# Patient Record
Sex: Female | Born: 1950
Health system: Southern US, Community
[De-identification: ages and names within clinical notes are randomized; demographics above are authoritative.]

## PROBLEM LIST (undated history)

## (undated) DIAGNOSIS — M1990 Unspecified osteoarthritis, unspecified site: Secondary | ICD-10-CM

## (undated) DIAGNOSIS — N1330 Unspecified hydronephrosis: Secondary | ICD-10-CM

## (undated) DIAGNOSIS — Z22322 Carrier or suspected carrier of Methicillin resistant Staphylococcus aureus: Secondary | ICD-10-CM

## (undated) DIAGNOSIS — C801 Malignant (primary) neoplasm, unspecified: Secondary | ICD-10-CM

## (undated) DIAGNOSIS — N201 Calculus of ureter: Secondary | ICD-10-CM

## (undated) DIAGNOSIS — F419 Anxiety disorder, unspecified: Secondary | ICD-10-CM

## (undated) DIAGNOSIS — R51 Headache: Secondary | ICD-10-CM

## (undated) DIAGNOSIS — I10 Essential (primary) hypertension: Secondary | ICD-10-CM

## (undated) DIAGNOSIS — J189 Pneumonia, unspecified organism: Secondary | ICD-10-CM

## (undated) DIAGNOSIS — E785 Hyperlipidemia, unspecified: Secondary | ICD-10-CM

## (undated) DIAGNOSIS — N200 Calculus of kidney: Secondary | ICD-10-CM

## (undated) DIAGNOSIS — Z87442 Personal history of urinary calculi: Secondary | ICD-10-CM

## (undated) DIAGNOSIS — I82409 Acute embolism and thrombosis of unspecified deep veins of unspecified lower extremity: Secondary | ICD-10-CM

## (undated) HISTORY — PX: BLADDER REPAIR: SHX76

## (undated) HISTORY — DX: Hyperlipidemia, unspecified: E78.5

## (undated) HISTORY — DX: Essential (primary) hypertension: I10

## (undated) HISTORY — DX: Calculus of kidney: N20.0

## (undated) HISTORY — DX: Headache: R51

## (undated) HISTORY — PX: VEIN LIGATION AND STRIPPING: SHX2653

---

## 1972-01-10 HISTORY — PX: PILONIDAL CYST EXCISION: SHX744

## 1997-05-19 ENCOUNTER — Other Ambulatory Visit: Admission: RE | Admit: 1997-05-19 | Discharge: 1997-05-19 | Payer: Self-pay | Admitting: Obstetrics & Gynecology

## 1998-01-09 HISTORY — PX: COLONOSCOPY: SHX174

## 1998-07-23 ENCOUNTER — Other Ambulatory Visit: Admission: RE | Admit: 1998-07-23 | Discharge: 1998-07-23 | Payer: Self-pay | Admitting: Obstetrics & Gynecology

## 1998-12-14 ENCOUNTER — Encounter (INDEPENDENT_AMBULATORY_CARE_PROVIDER_SITE_OTHER): Payer: Self-pay | Admitting: Specialist

## 1998-12-14 ENCOUNTER — Other Ambulatory Visit: Admission: RE | Admit: 1998-12-14 | Discharge: 1998-12-14 | Payer: Self-pay | Admitting: Obstetrics & Gynecology

## 1999-08-22 ENCOUNTER — Other Ambulatory Visit: Admission: RE | Admit: 1999-08-22 | Discharge: 1999-08-22 | Payer: Self-pay | Admitting: Obstetrics & Gynecology

## 2000-03-13 ENCOUNTER — Ambulatory Visit (HOSPITAL_COMMUNITY): Admission: RE | Admit: 2000-03-13 | Discharge: 2000-03-13 | Payer: Self-pay | Admitting: Urology

## 2000-03-13 HISTORY — PX: OTHER SURGICAL HISTORY: SHX169

## 2000-07-11 ENCOUNTER — Ambulatory Visit (HOSPITAL_COMMUNITY): Admission: RE | Admit: 2000-07-11 | Discharge: 2000-07-11 | Payer: Self-pay | Admitting: *Deleted

## 2001-03-29 ENCOUNTER — Other Ambulatory Visit: Admission: RE | Admit: 2001-03-29 | Discharge: 2001-03-29 | Payer: Self-pay | Admitting: Obstetrics & Gynecology

## 2002-02-05 ENCOUNTER — Other Ambulatory Visit: Admission: RE | Admit: 2002-02-05 | Discharge: 2002-02-05 | Payer: Self-pay | Admitting: Gynecology

## 2002-11-23 ENCOUNTER — Emergency Department (HOSPITAL_COMMUNITY): Admission: AD | Admit: 2002-11-23 | Discharge: 2002-11-23 | Payer: Self-pay | Admitting: Family Medicine

## 2003-09-28 ENCOUNTER — Other Ambulatory Visit: Admission: RE | Admit: 2003-09-28 | Discharge: 2003-09-28 | Payer: Self-pay | Admitting: Obstetrics & Gynecology

## 2003-11-17 ENCOUNTER — Ambulatory Visit: Payer: Self-pay | Admitting: Internal Medicine

## 2003-11-25 ENCOUNTER — Encounter: Admission: RE | Admit: 2003-11-25 | Discharge: 2003-11-25 | Payer: Self-pay | Admitting: Internal Medicine

## 2004-10-04 ENCOUNTER — Ambulatory Visit: Payer: Self-pay | Admitting: Family Medicine

## 2004-10-21 ENCOUNTER — Ambulatory Visit: Payer: Self-pay | Admitting: Family Medicine

## 2005-01-09 ENCOUNTER — Ambulatory Visit: Payer: Self-pay | Admitting: Family Medicine

## 2005-06-15 ENCOUNTER — Ambulatory Visit: Payer: Self-pay | Admitting: Internal Medicine

## 2006-05-28 ENCOUNTER — Ambulatory Visit: Payer: Self-pay | Admitting: Family Medicine

## 2006-05-28 DIAGNOSIS — J329 Chronic sinusitis, unspecified: Secondary | ICD-10-CM | POA: Insufficient documentation

## 2006-05-28 LAB — CONVERTED CEMR LAB: Rapid Strep: NEGATIVE

## 2008-08-31 ENCOUNTER — Ambulatory Visit: Payer: Self-pay | Admitting: Family Medicine

## 2008-08-31 DIAGNOSIS — F411 Generalized anxiety disorder: Secondary | ICD-10-CM | POA: Insufficient documentation

## 2008-08-31 DIAGNOSIS — R519 Headache, unspecified: Secondary | ICD-10-CM | POA: Insufficient documentation

## 2008-08-31 DIAGNOSIS — R51 Headache: Secondary | ICD-10-CM | POA: Insufficient documentation

## 2008-08-31 DIAGNOSIS — L0591 Pilonidal cyst without abscess: Secondary | ICD-10-CM | POA: Insufficient documentation

## 2008-08-31 DIAGNOSIS — Z87442 Personal history of urinary calculi: Secondary | ICD-10-CM | POA: Insufficient documentation

## 2008-08-31 DIAGNOSIS — I10 Essential (primary) hypertension: Secondary | ICD-10-CM | POA: Insufficient documentation

## 2008-08-31 DIAGNOSIS — R32 Unspecified urinary incontinence: Secondary | ICD-10-CM | POA: Insufficient documentation

## 2008-08-31 DIAGNOSIS — E785 Hyperlipidemia, unspecified: Secondary | ICD-10-CM | POA: Insufficient documentation

## 2008-10-13 ENCOUNTER — Ambulatory Visit: Payer: Self-pay | Admitting: Family Medicine

## 2008-10-13 LAB — CONVERTED CEMR LAB
ALT: 21 units/L (ref 0–35)
AST: 19 units/L (ref 0–37)
Albumin: 3.9 g/dL (ref 3.5–5.2)
Alkaline Phosphatase: 64 units/L (ref 39–117)
BUN: 18 mg/dL (ref 6–23)
Basophils Absolute: 0 10*3/uL (ref 0.0–0.1)
Basophils Relative: 0.3 % (ref 0.0–3.0)
Bilirubin Urine: NEGATIVE
Bilirubin, Direct: 0 mg/dL (ref 0.0–0.3)
Blood in Urine, dipstick: NEGATIVE
CO2: 29 meq/L (ref 19–32)
Calcium: 9.7 mg/dL (ref 8.4–10.5)
Chloride: 108 meq/L (ref 96–112)
Cholesterol: 189 mg/dL (ref 0–200)
Creatinine, Ser: 0.8 mg/dL (ref 0.4–1.2)
Eosinophils Absolute: 0.1 10*3/uL (ref 0.0–0.7)
Eosinophils Relative: 1.4 % (ref 0.0–5.0)
GFR calc non Af Amer: 78.86 mL/min (ref >60)
Glucose, Bld: 89 mg/dL (ref 70–99)
Glucose, Urine, Semiquant: NEGATIVE
HCT: 39.5 % (ref 36.0–46.0)
HDL: 34.7 mg/dL — ABNORMAL LOW (ref >39.00)
Hemoglobin: 13.7 g/dL (ref 12.0–15.0)
Ketones, urine, test strip: NEGATIVE
LDL Cholesterol: 135 mg/dL — ABNORMAL HIGH (ref 0–99)
Lymphocytes Relative: 32.9 % (ref 12.0–46.0)
Lymphs Abs: 1.6 10*3/uL (ref 0.7–4.0)
MCHC: 34.7 g/dL (ref 30.0–36.0)
MCV: 90.8 fL (ref 78.0–100.0)
Monocytes Absolute: 0.4 10*3/uL (ref 0.1–1.0)
Monocytes Relative: 7.7 % (ref 3.0–12.0)
Neutro Abs: 2.7 10*3/uL (ref 1.4–7.7)
Neutrophils Relative %: 57.7 % (ref 43.0–77.0)
Nitrite: NEGATIVE
Platelets: 149 10*3/uL — ABNORMAL LOW (ref 150.0–400.0)
Potassium: 4.9 meq/L (ref 3.5–5.1)
Protein, U semiquant: NEGATIVE
RBC: 4.36 M/uL (ref 3.87–5.11)
RDW: 12.4 % (ref 11.5–14.6)
Sodium: 140 meq/L (ref 135–145)
Specific Gravity, Urine: 1.02
TSH: 1.93 microintl units/mL (ref 0.35–5.50)
Total Bilirubin: 0.6 mg/dL (ref 0.3–1.2)
Total CHOL/HDL Ratio: 5
Total Protein: 7 g/dL (ref 6.0–8.3)
Triglycerides: 95 mg/dL (ref 0.0–149.0)
Urobilinogen, UA: 0.2
VLDL: 19 mg/dL (ref 0.0–40.0)
WBC: 4.8 10*3/uL (ref 4.5–10.5)
pH: 6.5

## 2008-10-20 ENCOUNTER — Ambulatory Visit: Payer: Self-pay | Admitting: Family Medicine

## 2008-12-23 ENCOUNTER — Telehealth: Payer: Self-pay | Admitting: Family Medicine

## 2009-11-29 ENCOUNTER — Ambulatory Visit: Payer: Self-pay | Admitting: Family Medicine

## 2009-11-29 DIAGNOSIS — T753XXA Motion sickness, initial encounter: Secondary | ICD-10-CM | POA: Insufficient documentation

## 2009-11-29 DIAGNOSIS — J309 Allergic rhinitis, unspecified: Secondary | ICD-10-CM | POA: Insufficient documentation

## 2009-12-20 ENCOUNTER — Telehealth: Payer: Self-pay | Admitting: Family Medicine

## 2009-12-31 ENCOUNTER — Telehealth: Payer: Self-pay | Admitting: Family Medicine

## 2010-02-08 NOTE — Assessment & Plan Note (Signed)
Summary: SINUSITIS? // RS/pt rsc/cjr   Vital Signs:  Patient profile:   60 year old female Weight:      215 pounds O2 Sat:      96 % Temp:     98.2 degrees F Pulse rate:   86 / minute BP sitting:   120 / 84  (left arm) Cuff size:   large  Vitals Entered By: Pura Spice, RN (November 29, 2009 10:51 AM) CC: sinus inf roaring in ears  leaving Dec 20 flying and wants something to calm her down.    History of Present Illness: Here to ask about something for anxiety when flying. She will be flying to [place] this week with her husband. Also she gets motion sickness when on a plane or a boat. Lastly she has had some sinus congestion for several weeks. No fever or ST or cough.   Allergies: 1)  ! Xylocaine 2)  ! Percodan  Past History:  Past Medical History: Reviewed history from 08/31/2008 and no changes required. Post Menopausal, sees Dr. Ilda Mori for GYN exams Chickenpox Headache Hyperlipidemia Hypertension Nephrolithiasis, hx of Urinary incontinence Pylonidal cyst  Review of Systems  The patient denies anorexia, fever, weight loss, weight gain, vision loss, decreased hearing, hoarseness, chest pain, syncope, dyspnea on exertion, peripheral edema, prolonged cough, headaches, hemoptysis, abdominal pain, melena, hematochezia, severe indigestion/heartburn, hematuria, incontinence, genital sores, muscle weakness, suspicious skin lesions, transient blindness, difficulty walking, depression, unusual weight change, abnormal bleeding, enlarged lymph nodes, angioedema, breast masses, and testicular masses.    Physical Exam  General:  Well-developed,well-nourished,in no acute distress; alert,appropriate and cooperative throughout examination Head:  Normocephalic and atraumatic without obvious abnormalities. No apparent alopecia or balding. Eyes:  No corneal or conjunctival inflammation noted. EOMI. Perrla. Funduscopic exam benign, without hemorrhages, exudates or papilledema.  Vision grossly normal. Ears:  External ear exam shows no significant lesions or deformities.  Otoscopic examination reveals clear canals, tympanic membranes are intact bilaterally without bulging, retraction, inflammation or discharge. Hearing is grossly normal bilaterally. Nose:  External nasal examination shows no deformity or inflammation. Nasal mucosa are pink and moist without lesions or exudates. Mouth:  Oral mucosa and oropharynx without lesions or exudates.  Teeth in good repair. Neck:  No deformities, masses, or tenderness noted. Lungs:  Normal respiratory effort, chest expands symmetrically. Lungs are clear to auscultation, no crackles or wheezes. Heart:  Normal rate and regular rhythm. S1 and S2 normal without gallop, murmur, click, rub or other extra sounds. Psych:  Cognition and judgment appear intact. Alert and cooperative with normal attention span and concentration. No apparent delusions, illusions, hallucinations   Impression & Recommendations:  Problem # 1:  MOTION SICKNESS (ICD-994.6)  Problem # 2:  ALLERGIC RHINITIS (ICD-477.9)  Problem # 3:  ANXIETY STATE, UNSPECIFIED (ICD-300.00)  Her updated medication list for this problem includes:    Ativan 1 Mg Tabs (Lorazepam) .Marland Kitchen... 1 q 6 hours as needed anxiety  Complete Medication List: 1)  Benicar 20 Mg Tabs (Olmesartan medoxomil) .Marland Kitchen.. 1 tablet by mouth daily 2)  Ativan 1 Mg Tabs (Lorazepam) .Marland Kitchen.. 1 q 6 hours as needed anxiety 3)  Bactroban 2 % Oint (Mupirocin) .... Apply three times a day 4)  Transderm-scop 1.5 Mg Pt72 (Scopolamine base) .... Apply one behind the ear q 3 days  Patient Instructions: 1)  Please schedule a follow-up appointment as needed .  Prescriptions: TRANSDERM-SCOP 1.5 MG PT72 (SCOPOLAMINE BASE) apply one behind the ear q 3 days  #2 x 2  Entered and Authorized by:   Nelwyn Salisbury MD   Signed by:   Nelwyn Salisbury MD on 11/29/2009   Method used:   Print then Give to Patient   RxID:    [id number] ATIVAN 1 MG TABS (LORAZEPAM) 1 q 6 hours as needed anxiety  #60 x 2   Entered and Authorized by:   Nelwyn Salisbury MD   Signed by:   Nelwyn Salisbury MD on 11/29/2009   Method used:   Print then Give to Patient   RxID:   [id number]    Orders Added: 1)  Est. Patient Level IV [95284]

## 2010-02-10 NOTE — Progress Notes (Signed)
  Phone Note Call from Patient   Caller: Patient Call For: Nelwyn Salisbury MD Summary of Call: Pt is out of town with vague UTI symptoms.  Advised to go to Urgent Care and have UA  for treatment.  Pt agreed. Initial call taken by: Lynann Beaver CMA AAMA,  December 31, 2009 8:51 AM  Follow-up for Phone Call        noted Follow-up by: Nelwyn Salisbury MD,  January 04, 2010 1:19 PM

## 2010-02-10 NOTE — Progress Notes (Signed)
Summary: Rx Refill -Benicar  Phone Note Refill Request Call back at Home Phone [phone] Message from:  Patient on December 20, 2009 12:16 PM  Refills Requested: Medication #1:  BENICAR 20 MG TABS 1 tablet by mouth daily Will need before 01/01/10.   Method Requested: Electronic Initial call taken by: Lucy Antigua,  December 20, 2009 12:15 PM    Prescriptions: BENICAR 20 MG TABS (OLMESARTAN MEDOXOMIL) 1 tablet by mouth daily  #30 x 6   Entered by:   Willy Eddy, LPN   Authorized by:   Nelwyn Salisbury MD   Signed by:   Willy Eddy, LPN on 24/40/1027   Method used:   Electronically to        [id number]       [address]       [place], [place]  [address]       Ph: [phone] or [phone]       Fax: [phone]   RxID:   [id number]   [facility] [address] [facility] [place]

## 2010-05-27 NOTE — Op Note (Signed)
[facility]  Patient:    Megan Crane, Megan Crane                      MRN: [id number] Proc. Date: 03/13/00 Adm. Date:  [id number] Attending:  Evlyn Clines                           Operative Report  PROCEDURE:  Right ureteroscopic stone extraction with insertion of right double-J stent.  PREOPERATIVE DIAGNOSIS:  Right distal ureteral stone.  POSTOPERATIVE DIAGNOSIS:  Right distal ureteral stone.  SURGEON:  Excell Seltzer. Annabell Howells, M.D.  ANESTHESIA:  General.  SPECIMENS:  Stone.  DRAINS:  6 French x 24 cm double-J stent.  COMPLICATIONS:  None.  INDICATIONS:  [name] is a 60 year old white female, who presented with hematuria.  She was found to have a 5 x 10 mm right distal ureteral stone with partial obstruction.  Based on her symptoms, this stone had likely been present for quite some time.  FINDINGS AT PROCEDURE:  The patient was taken to the operating room after receiving p.o. Tequin.  A general anesthetic was induced.  She was placed in lithotomy position.  Her perineum and genitalia were prepped with Betadine solution.  She was draped in the usual sterile fashion.  Cystoscopy was performed using a 22 [place] scope with 12 and 70 degree lenses.  Examination revealed a normal urethra.  The bladder wall was smooth and pale without tumor, stones, or inflammation.  The ureter orifices were in their normal anatomic position.  The right ureteral orifice was cannulated with a guidewire under fluoroscopic guidance.  I was able to get the guidewire in a short distance, but it impacted on the stone.  It would not pass.  I then passed a 5 [place] open-end catheter over the wire to stiffen it.  This allowed me to pass the wire by the stone to the kidney.  A 4 cm 15 French balloon dilation catheter was then placed over the wire, across the ureteral meatus.  The balloon was dilated 12 atmospheres and then deflated and removed.  The cystoscope was removed, leaving  the wire past the stone.  A 6 French short ureteroscope was then passed into the ureter, and the stone was engaged with a 3 [place] flat wire basket.  The stone was then removed without difficulty with the basket.  There was some resistance to removal in the distal ureter where there was a fair amount of inflamed mucosa from the chronic presence of the stone.  In reinspection, after removal of the stone, revealed a flap of ureteral mucosa raised in through the ureteral meatus.  However, inspection of the ureter revealed no disruption of the ureter and no retained stone fragments.  A cystoscope was placed over the wire, and a 6 French 24 cm double-J stent was inserted without difficulty to the kidney under fluoroscopic guidance.  Upon removal of the wire, there was a good coil in the kidney and a good coil in the bladder.  The bladder was drained; the scope was removed.  The stent string was tied and cut short.  The patient was taken down from lithotomy position.  Her anesthetic was reversed.  She was moved to the recovery room in stable condition.  There were no complications. DD:  03/13/00 TD:  03/13/00 Job: [id number]

## 2010-08-15 ENCOUNTER — Other Ambulatory Visit: Payer: Self-pay | Admitting: Family Medicine

## 2010-10-18 ENCOUNTER — Ambulatory Visit: Payer: Self-pay | Admitting: Family Medicine

## 2010-10-19 ENCOUNTER — Ambulatory Visit (INDEPENDENT_AMBULATORY_CARE_PROVIDER_SITE_OTHER): Payer: BC Managed Care – PPO | Admitting: Family Medicine

## 2010-10-19 ENCOUNTER — Encounter: Payer: Self-pay | Admitting: Family Medicine

## 2010-10-19 VITALS — BP 110/78 | HR 100 | Temp 98.5°F | Wt 219.0 lb

## 2010-10-19 DIAGNOSIS — I10 Essential (primary) hypertension: Secondary | ICD-10-CM

## 2010-10-19 DIAGNOSIS — L719 Rosacea, unspecified: Secondary | ICD-10-CM

## 2010-10-19 MED ORDER — METRONIDAZOLE 0.75 % EX GEL: Freq: Two times a day (BID) | CUTANEOUS | Status: DC

## 2010-10-19 NOTE — Progress Notes (Signed)
  Subjective:    Patient ID: Megan Crane, female    DOB: [birth year], 61 y.o.   MRN: [id number]  HPI Here for several issues. First is to follow up on HTN. Second is to discuss a rash on her face that has been present for about 2 months. She is using Triamcinolone cream with no results. Also she wants to tell me about a surgery planned for January with Dr. Wyvonnia Lora to repair a pelvic prolapse. This will involve some major procedures involving the uterus, vagina, and bladder. In general she feels well.   Review of Systems  Constitutional: Negative.   Respiratory: Negative.   Cardiovascular: Negative.   Skin: Positive for rash.       Objective:   Physical Exam  Constitutional: She appears well-developed and well-nourished.  Cardiovascular: Normal rate, regular rhythm, normal heart sounds and intact distal pulses.   Pulmonary/Chest: Effort normal and breath sounds normal.  Skin:       The forehead, cheeks, and chin have a macular erythematous rash with a few pustules and papules          Assessment & Plan:  I refilled her Benicar since her HTN is stable. She should do well with the pelvic surgery since she is basically fairly healthy. Try Metrogel for the rosacea.

## 2010-11-23 ENCOUNTER — Ambulatory Visit (INDEPENDENT_AMBULATORY_CARE_PROVIDER_SITE_OTHER): Payer: BC Managed Care – PPO | Admitting: Family Medicine

## 2010-11-23 DIAGNOSIS — Z23 Encounter for immunization: Secondary | ICD-10-CM

## 2010-12-05 ENCOUNTER — Other Ambulatory Visit: Payer: Self-pay | Admitting: Family Medicine

## 2011-03-21 ENCOUNTER — Other Ambulatory Visit: Payer: Self-pay | Admitting: Urology

## 2011-03-29 ENCOUNTER — Other Ambulatory Visit: Payer: Self-pay | Admitting: Family Medicine

## 2011-04-06 ENCOUNTER — Ambulatory Visit (INDEPENDENT_AMBULATORY_CARE_PROVIDER_SITE_OTHER): Payer: BC Managed Care – PPO | Admitting: Family Medicine

## 2011-04-06 ENCOUNTER — Encounter: Payer: Self-pay | Admitting: Family Medicine

## 2011-04-06 VITALS — BP 130/88 | HR 92 | Temp 98.6°F | Wt 226.0 lb

## 2011-04-06 DIAGNOSIS — R350 Frequency of micturition: Secondary | ICD-10-CM

## 2011-04-06 DIAGNOSIS — J309 Allergic rhinitis, unspecified: Secondary | ICD-10-CM

## 2011-04-06 DIAGNOSIS — Z9109 Other allergy status, other than to drugs and biological substances: Secondary | ICD-10-CM

## 2011-04-06 DIAGNOSIS — N390 Urinary tract infection, site not specified: Secondary | ICD-10-CM

## 2011-04-06 LAB — POCT URINALYSIS DIPSTICK
Bilirubin, UA: NEGATIVE
Blood, UA: NEGATIVE
Glucose, UA: NEGATIVE
Ketones, UA: NEGATIVE
Leukocytes, UA: NEGATIVE
Nitrite, UA: NEGATIVE
Protein, UA: NEGATIVE
Spec Grav, UA: 1.01
Urobilinogen, UA: 0.2
pH, UA: 6.5

## 2011-04-06 NOTE — Progress Notes (Signed)
  Subjective:    Patient ID: Megan Crane, female    DOB: [birth year], 61 y.o.   MRN: [id number]  HPI Here for several issues. First for the past week she has had some increased frequency of urinations. No burning or pain or fever. She does have a pelvic prolapse, and she is set for a surgical correction for this in May. Also for several weeks she has had sinus congestion and pressure, along with PND. No fever or ST or cough. She saw a doctor in [place] about one month ago, and they gave her a course of Augmentin.   Review of Systems  Constitutional: Negative.   HENT: Positive for congestion and postnasal drip.   Eyes: Negative.   Respiratory: Negative.   Gastrointestinal: Negative.   Genitourinary: Positive for urgency and frequency. Negative for dysuria, flank pain and pelvic pain.       Objective:   Physical Exam  Constitutional: She appears well-developed and well-nourished.  HENT:  Right Ear: External ear normal.  Left Ear: External ear normal.  Nose: Nose normal.  Mouth/Throat: Oropharynx is clear and moist. No oropharyngeal exudate.  Eyes: Conjunctivae are normal.  Pulmonary/Chest: Effort normal and breath sounds normal.  Abdominal: Soft. Bowel sounds are normal. She exhibits no distension and no mass. There is no tenderness. There is no rebound and no guarding.  Lymphadenopathy:    She has no cervical adenopathy.          Assessment & Plan:  She has some allergic congestion so I suggested she try Mucinex bid. She has no evidence of a UTI, so I advised her to drink plenty of water. Hopefully her upcoming surgery will alleviate a lot of these symptoms.

## 2011-04-10 MED ORDER — FENTANYL CITRATE 0.05 MG/ML IJ SOLN
INTRAMUSCULAR | Status: AC
  Filled 2011-04-10: qty 2

## 2011-04-10 MED ORDER — MEPERIDINE HCL 25 MG/ML IJ SOLN
INTRAMUSCULAR | Status: AC
  Filled 2011-04-10: qty 1

## 2011-04-10 MED ORDER — PHENYLEPHRINE 40 MCG/ML (10ML) SYRINGE FOR IV PUSH (FOR BLOOD PRESSURE SUPPORT)
PREFILLED_SYRINGE | INTRAVENOUS | Status: AC
  Filled 2011-04-10: qty 5

## 2011-04-10 MED ORDER — ONDANSETRON HCL 4 MG/2ML IJ SOLN
INTRAMUSCULAR | Status: AC
  Filled 2011-04-10: qty 2

## 2011-04-10 MED ORDER — MORPHINE SULFATE 0.5 MG/ML IJ SOLN
INTRAMUSCULAR | Status: AC
  Filled 2011-04-10: qty 10

## 2011-04-10 MED ORDER — OXYTOCIN 10 UNIT/ML IJ SOLN
INTRAMUSCULAR | Status: AC
  Filled 2011-04-10: qty 2

## 2011-04-10 MED ORDER — DEXAMETHASONE SODIUM PHOSPHATE 10 MG/ML IJ SOLN
INTRAMUSCULAR | Status: AC
  Filled 2011-04-10: qty 1

## 2011-04-10 MED ORDER — KETOROLAC TROMETHAMINE 30 MG/ML IJ SOLN
INTRAMUSCULAR | Status: AC
  Filled 2011-04-10: qty 1

## 2011-05-02 ENCOUNTER — Ambulatory Visit (INDEPENDENT_AMBULATORY_CARE_PROVIDER_SITE_OTHER): Payer: BC Managed Care – PPO | Admitting: Family Medicine

## 2011-05-02 ENCOUNTER — Encounter: Payer: Self-pay | Admitting: Family Medicine

## 2011-05-02 VITALS — BP 124/78 | HR 91 | Temp 98.9°F | Wt 222.0 lb

## 2011-05-02 DIAGNOSIS — H6120 Impacted cerumen, unspecified ear: Secondary | ICD-10-CM

## 2011-05-02 DIAGNOSIS — J329 Chronic sinusitis, unspecified: Secondary | ICD-10-CM

## 2011-05-02 MED ORDER — LEVOFLOXACIN 500 MG PO TABS: 500.0000 mg | ORAL_TABLET | Freq: Every day | ORAL | Status: DC

## 2011-05-02 NOTE — Progress Notes (Signed)
  Subjective:    Patient ID: Megan Crane, female    DOB: [birth year], 61 y.o.   MRN: [id number]  HPI Here for recurrent head congestion, ear pressure and pain, PND, ST, and a dry cough for the past 2 months. No fever. She was given Augmentin a month ago, but this did not help. Then last week she developed pain in the right ear, so she went to our clinic in [place]. She was diagnosed with an outer ear infection and given Cortisporin drops. Now the ear pain is gone but she still has trouble hearing out of it. She is using Mucinex and Zyrtec.    Review of Systems  Constitutional: Negative.   HENT: Positive for hearing loss, congestion, postnasal drip and sinus pressure.   Eyes: Negative.   Respiratory: Positive for cough.        Objective:   Physical Exam  Constitutional: She appears well-developed and well-nourished.  HENT:  Nose: Nose normal.  Mouth/Throat: Oropharynx is clear and moist. No oropharyngeal exudate.       Both ear canals are full of cerumen   Eyes: Conjunctivae are normal.  Neck: No thyromegaly present.  Pulmonary/Chest: Effort normal and breath sounds normal.  Lymphadenopathy:    She has no cervical adenopathy.          Assessment & Plan:  I think she has had an underlying sinusitis for the past several months that has never been fully treated. We will give her 109 days of Levaquin. I was able to manually disimpact the right ear canal with a speculum. She will follow up prn

## 2011-05-03 ENCOUNTER — Telehealth: Payer: Self-pay | Admitting: *Deleted

## 2011-05-03 NOTE — Telephone Encounter (Signed)
This is a dr. Clent Ridges pt - last seen 05/02/11 by hime - dr. Amador Cunas has not seen her.

## 2011-05-03 NOTE — Telephone Encounter (Addendum)
Pt took the Levaquin, and has developed nausea, dizziness, disorientation, and some itching on legs.  She will DC the Levaqin, and get some Benadry.  She will take one dose, and wait to hear from Dr Clent Ridges  about the antibiotic.

## 2011-05-04 MED ORDER — DOXYCYCLINE HYCLATE 100 MG PO TABS: 100.0000 mg | ORAL_TABLET | Freq: Two times a day (BID) | ORAL | Status: AC

## 2011-05-04 NOTE — Telephone Encounter (Signed)
I agree with stopping the Levaquin. Instead call in Doxycycline 100 mg bid for 10 days

## 2011-05-04 NOTE — Telephone Encounter (Signed)
Pt notified.  She will stop the Levaquin, start the Doxycyline, use the nasal spray, and Mucinex.

## 2011-05-23 ENCOUNTER — Telehealth: Payer: Self-pay | Admitting: Family Medicine

## 2011-05-23 NOTE — Telephone Encounter (Signed)
Megan Crane [phone] will be new pt. Per mom MD agreed to see her daughter as new pt.

## 2011-05-23 NOTE — Telephone Encounter (Signed)
Pt

## 2011-05-24 ENCOUNTER — Encounter (HOSPITAL_COMMUNITY): Payer: Self-pay | Admitting: Pharmacist

## 2011-05-24 NOTE — Telephone Encounter (Signed)
Pt daughter is aware.

## 2011-05-24 NOTE — Telephone Encounter (Signed)
agreed

## 2011-05-31 ENCOUNTER — Encounter (HOSPITAL_COMMUNITY): Payer: Self-pay

## 2011-05-31 ENCOUNTER — Encounter (HOSPITAL_COMMUNITY)
Admission: RE | Admit: 2011-05-31 | Discharge: 2011-05-31 | Disposition: A | Discharge status: Home / Self Care | Payer: BC Managed Care – PPO | Source: Ambulatory Visit | Attending: Obstetrics & Gynecology | Admitting: Obstetrics & Gynecology

## 2011-05-31 LAB — BASIC METABOLIC PANEL
BUN: 17 mg/dL (ref 6–23)
Calcium: 9.6 mg/dL (ref 8.4–10.5)
Chloride: 101 mEq/L (ref 96–112)
Creatinine, Ser: 0.74 mg/dL (ref 0.50–1.10)
GFR calc Af Amer: >90 mL/min (ref >90)

## 2011-05-31 LAB — APTT: aPTT: 29 seconds (ref 24–37)

## 2011-05-31 LAB — CBC
Platelets: 181 10*3/uL (ref 150–400)
RBC: 4.35 MIL/uL (ref 3.87–5.11)
RDW: 13.3 % (ref 11.5–15.5)
WBC: 7.2 10*3/uL (ref 4.0–10.5)

## 2011-05-31 LAB — PROTIME-INR
INR: 0.94 (ref 0.00–1.49)
Prothrombin Time: 12.8 seconds (ref 11.6–15.2)

## 2011-05-31 LAB — SURGICAL PCR SCREEN: MRSA, PCR: NEGATIVE

## 2011-05-31 NOTE — Patient Instructions (Addendum)
20 Megan Crane  05/31/2011   Your procedure is scheduled on:  06/09/11  Enter through the Main Entrance of [facility] at 600 AM.  Pick up the phone at the desk and dial [phone].   Call this number if you have problems the morning of surgery: [phone]   Remember:   Do not eat food:After Midnight.  Do not drink clear liquids: After Midnight.  Take these medicines the morning of surgery with A SIP OF WATER: Benicar   Do not wear jewelry, make-up or nail polish.  Do not wear lotions, powders, or perfumes. You may wear deodorant.  Do not shave 48 hours prior to surgery.  Do not bring valuables to the hospital.  Contacts, dentures or bridgework may not be worn into surgery.  Leave suitcase in the car. After surgery it may be brought to your room.  For patients admitted to the hospital, checkout time is 11:00 AM the day of discharge.   Patients discharged the day of surgery will not be allowed to drive home.  Name and phone number of your driver: NA  Special Instructions: CHG Shower Use Special Wash: 1/2 bottle night before surgery and 1/2 bottle morning of surgery.   Please read over the following fact sheets that you were given: MRSA Information

## 2011-06-08 MED ORDER — GENTAMICIN IN SALINE 1.6-0.9 MG/ML-% IV SOLN
80.0000 mg | INTRAVENOUS | Status: AC
  Administered 2011-06-09: 80 mg via INTRAVENOUS
  Filled 2011-06-08 (×2): qty 50

## 2011-06-08 MED ORDER — CEFAZOLIN SODIUM-DEXTROSE 2-3 GM-% IV SOLR
2.0000 g | INTRAVENOUS | Status: AC
  Administered 2011-06-09: 2 g via INTRAVENOUS
  Filled 2011-06-08: qty 50

## 2011-06-08 NOTE — H&P (Signed)
History of Present Illness   Megan Crane has mixed stress urge incontinence. She rarely leaks with coughing, sneezing and bending. Sometimes she has urgency but she at times has high volume key-in-the-door syndrome. She had shiny vaginal epithelium on examination. She had a very large posterior defect and her uterus descended 3-4 cm. She had a high grade 2 cystocele. Her tissues looked a little bit inflamed. She had no stress incontinence after cystoscopy with the cystocele reduced. Her residual was low. She generally does not wear pads. I thought that she would likely best benefit from a transvaginal hysterectomy and a cystocele repair and possibly a graft anterior, but also a posterior repair and graft and vault suspension posteriorly. She was a bit tender in the vaginal area and I prescribed some Estrace cream.   On urodynamics during uroflowmetry she voided 186 mL with a maximum flow of 13.9 mL per second. The test was stopped because the patient was feeling a lot of anxiety and she was afraid she was going to have an anxiety attack. The catheter were inserted with coaching because she became quite anxious.   I drew Megan Crane a picture. I went over a transvaginal hysterectomy and cystocele repair plus or minus graft and rectocele repair plus graft plus or minus vault suspension .   I drew her a picture and we talked about prolapse surgery in detail. Pros, cons, general surgical and anesthetic risks, and other options including behavioral therapy, pessaries, and watchful waiting were discussed. She understands that prolapse repairs are successful in 80-85% of cases for prolapse symptoms and can recur anteriorly, posteriorly, and/or apically. She understands that in most cases I use a graft and general risks were discussed. Surgical risks were described but not limited to the discussion of injury to neighboring structures including the bowel (with possible life-threatening sepsis and colostomy),  bladder, urethra, vagina (all resulting in further surgery), and ureter (resulting in re-implantation). We talked about injury to nerves/soft tissue leading to debilitating and intractable pelvic, abdominal, and lower extremity pain syndromes and neuropathies. The risks of buttock pain, intractable dyspareunia, and vaginal narrowing and shortening with sequelae were discussed. Bleeding risks, transfusion rates, and infection were discussed. The risk of persistent, de novo, or worsening bladder and/or bowel incontinence/dysfunction was discussed. The need for CIC was described as well the usual post-operative course. The patient understands that she might not reach her treatment goal and that she might be worse following surgery.  Watchful waiting and pessary was also discussed.   Recognizing the limitations of counseling about urodynamics, I talked to her about a sling. She understands that if she were not having surgery I would treat her incontinence nonsurgically.  We talked about a sling in detail. Pros, cons, general surgical and anesthetic risks, and other options including behavioral therapy and watchful waiting were discussed. She understands that slings are generally successful in 90% of cases for stress incontinence, 50% for urge incontinence, and that in a small percentage of cases the incontinence can worsen. The risk of persistent, de novo, or worsening incontinence/dysfunction was discussed. Risks were described but not limited to the discussion of injury to neighboring structures including the bowel (with possible life-threatening sepsis and colostomy), bladder, urethra, vagina (all resulting in further surgery), and ureter (resulting in re-implantation). We also talked about the risk of retention requiring urethrolysis, extrusion requiring revision, and erosion resulting in further surgery. Bleeding risks and transfusion rates and the risk of infection were discussed. The risk of pelvic and  abdominal pain syndromes, dyspareunia, and neuropathies were discussed. The need for CIC was described as well the usual post-operative course. The patient understands that she might not reach her treatment goal and that she might be worse following surgery.    Past Medical History Problems  1. History of  Anxiety (Symptom) 300.00 2. History of  Arthritis V13.4 3. History of  Hypercholesterolemia 272.0 4. History of  Hypertension 401.9  Surgical History Problems  1. History of  Back Surgery 2. History of  Kidney Surgery 3. History of  Leg Repair  Current Meds 1. Benicar 20 MG Oral Tablet; Therapy: 27Nov2012 to 2. Estrace 0.1 MG/GM Vaginal Cream; 1 gm 3x/week x 4 weeks, then 1x per week; Therapy:  11Jan2013 to (Last Rx:11Jan2013)  Allergies Medication  1. Percodan TABS 2. Xylocaine SOLN  Family History Problems  1. Paternal history of  Blood In Urine 2. Paternal history of  Cardiac Arrest 3. Paternal history of  Chronic Renal Failure 4. Maternal history of  Colon Cancer V16.0 5. Paternal history of  Death In The Family Father father passed @ age 35kidney failure 6. Maternal history of  Death In The Family Mother mother passed @ age 45cancer 50. Family history of  Family Health Status Number Of Children 2 daughters 8. Paternal history of  Kidney Cancer V16.51 9. Fraternal history of  Nephrolithiasis  Social History Problems  1. Alcohol Use one a week 2. Caffeine Use 2 drinks daily 3. Marital History - Currently Married 4. Never A Smoker 5. Occupation: Conservation officer, nature part time Denied  6. History of  Tobacco Use  Assessment Assessed  1. Cystocele 596.89 2. Rectocele 596.89 3. Urge And Stress Incontinence 788.33  Plan Urge And Stress Incontinence (788.33)  1. Follow-up PRN Office  Follow-up  Requested for: 22Feb2013  Discussion/Summary   Megan Crane did not want to proceed with a sling and I think it is a very good idea for her since her primary problem is mild  urge incontinence. She will continue with Estrace cream. She is hoping to have surgery by myself and Dr. Arlyce Dice in early March and I will try to accommodate her. We will proceed accordingly. A total of 50 minutes were spent in the overall care of the patient today including review of chart, etc. I spent at least 25-30 minutes face to face with her and her husband.  After a thorough review of the management options for the patient's condition the patient  elected to proceed with surgical therapy as noted above. We have discussed the potential benefits and risks of the procedure, side effects of the proposed treatment, the likelihood of the patient achieving the goals of the procedure, and any potential problems that might occur during the procedure or recuperation. Informed consent has been obtained.

## 2011-06-08 NOTE — H&P (Signed)
  I reviewed the H&P of Dr. Sherron Monday.   I have asked him to consult and operate on Ms. Robley in view of her significant pelvic prolapse and his expertise in pelvic support procedures.  I will perform the [facility] and assist in the vaginal repair.

## 2011-06-09 ENCOUNTER — Encounter (HOSPITAL_COMMUNITY)
Admission: RE | Disposition: A | Discharge status: Home / Self Care | Payer: Self-pay | Source: Ambulatory Visit | Attending: Obstetrics & Gynecology

## 2011-06-09 ENCOUNTER — Encounter (HOSPITAL_COMMUNITY): Payer: Self-pay | Admitting: Anesthesiology

## 2011-06-09 ENCOUNTER — Ambulatory Visit (HOSPITAL_COMMUNITY): Payer: BC Managed Care – PPO | Admitting: Anesthesiology

## 2011-06-09 ENCOUNTER — Encounter (HOSPITAL_COMMUNITY): Payer: Self-pay | Admitting: *Deleted

## 2011-06-09 ENCOUNTER — Observation Stay (HOSPITAL_COMMUNITY)
Admission: RE | Admit: 2011-06-09 | Discharge: 2011-06-10 | DRG: 359 | Disposition: A | Discharge status: Home / Self Care | Payer: BC Managed Care – PPO | Source: Ambulatory Visit | Attending: Obstetrics & Gynecology | Admitting: Obstetrics & Gynecology

## 2011-06-09 DIAGNOSIS — Z01812 Encounter for preprocedural laboratory examination: Secondary | ICD-10-CM

## 2011-06-09 DIAGNOSIS — Z01818 Encounter for other preprocedural examination: Secondary | ICD-10-CM

## 2011-06-09 DIAGNOSIS — Z90710 Acquired absence of both cervix and uterus: Secondary | ICD-10-CM

## 2011-06-09 DIAGNOSIS — N812 Incomplete uterovaginal prolapse: Principal | ICD-10-CM | POA: Diagnosis present

## 2011-06-09 DIAGNOSIS — N840 Polyp of corpus uteri: Secondary | ICD-10-CM | POA: Diagnosis present

## 2011-06-09 HISTORY — PX: CYSTOSCOPY: SHX5120

## 2011-06-09 HISTORY — PX: RECTOCELE REPAIR: SHX761

## 2011-06-09 HISTORY — PX: VAGINAL HYSTERECTOMY: SHX2639

## 2011-06-09 HISTORY — PX: VAGINAL PROLAPSE REPAIR: SHX830

## 2011-06-09 LAB — TYPE AND SCREEN: Antibody Screen: NEGATIVE

## 2011-06-09 LAB — CBC
Hemoglobin: 11.3 g/dL — ABNORMAL LOW (ref 12.0–15.0)
MCH: 29.6 pg (ref 26.0–34.0)
MCHC: 32.9 g/dL (ref 30.0–36.0)
Platelets: 168 10*3/uL (ref 150–400)
RDW: 13.5 % (ref 11.5–15.5)

## 2011-06-09 SURGERY — HYSTERECTOMY, VAGINAL
Anesthesia: General | Site: Vagina | Wound class: Clean Contaminated

## 2011-06-09 MED ORDER — IRBESARTAN 75 MG PO TABS
150.0000 mg | ORAL_TABLET | Freq: Every day | ORAL | Status: DC
  Administered 2011-06-10: 150 mg via ORAL
  Filled 2011-06-09 (×3): qty 2

## 2011-06-09 MED ORDER — HYDROMORPHONE HCL PF 1 MG/ML IJ SOLN
INTRAMUSCULAR | Status: DC | PRN
  Administered 2011-06-09 (×2): 0.5 mg via INTRAVENOUS

## 2011-06-09 MED ORDER — LACTATED RINGERS IV SOLN
INTRAVENOUS | Status: DC
  Administered 2011-06-09 (×3): via INTRAVENOUS

## 2011-06-09 MED ORDER — ESTRADIOL 0.1 MG/GM VA CREA
TOPICAL_CREAM | VAGINAL | Status: AC
  Filled 2011-06-09: qty 42.5

## 2011-06-09 MED ORDER — DEXAMETHASONE SODIUM PHOSPHATE 10 MG/ML IJ SOLN
INTRAMUSCULAR | Status: DC | PRN
  Administered 2011-06-09: 10 mg via INTRAVENOUS

## 2011-06-09 MED ORDER — DEXAMETHASONE SODIUM PHOSPHATE 10 MG/ML IJ SOLN
INTRAMUSCULAR | Status: AC
  Filled 2011-06-09: qty 1

## 2011-06-09 MED ORDER — ROCURONIUM BROMIDE 50 MG/5ML IV SOLN
INTRAVENOUS | Status: AC
  Filled 2011-06-09: qty 1

## 2011-06-09 MED ORDER — INDIGOTINDISULFONATE SODIUM 8 MG/ML IJ SOLN
INTRAMUSCULAR | Status: AC
  Filled 2011-06-09: qty 5

## 2011-06-09 MED ORDER — PROPOFOL 10 MG/ML IV EMUL
INTRAVENOUS | Status: DC | PRN
  Administered 2011-06-09: 10 mg via INTRAVENOUS
  Administered 2011-06-09: 150 mg via INTRAVENOUS
  Administered 2011-06-09: 10 mg via INTRAVENOUS

## 2011-06-09 MED ORDER — ONDANSETRON HCL 4 MG/2ML IJ SOLN
INTRAMUSCULAR | Status: AC
  Filled 2011-06-09: qty 2

## 2011-06-09 MED ORDER — INDIGOTINDISULFONATE SODIUM 8 MG/ML IJ SOLN
INTRAMUSCULAR | Status: DC | PRN
  Administered 2011-06-09: 5 mL via INTRAVENOUS

## 2011-06-09 MED ORDER — ROCURONIUM BROMIDE 100 MG/10ML IV SOLN
INTRAVENOUS | Status: DC | PRN
  Administered 2011-06-09 (×2): 10 mg via INTRAVENOUS
  Administered 2011-06-09: 40 mg via INTRAVENOUS
  Administered 2011-06-09 (×2): 10 mg via INTRAVENOUS

## 2011-06-09 MED ORDER — MIDAZOLAM HCL 5 MG/5ML IJ SOLN
INTRAMUSCULAR | Status: DC | PRN
  Administered 2011-06-09: 2 mg via INTRAVENOUS

## 2011-06-09 MED ORDER — BUPIVACAINE-EPINEPHRINE (PF) 0.5% -1:200000 IJ SOLN
INTRAMUSCULAR | Status: AC
  Filled 2011-06-09: qty 10

## 2011-06-09 MED ORDER — KETOROLAC TROMETHAMINE 30 MG/ML IJ SOLN: 30.0000 mg | Freq: Four times a day (QID) | INTRAMUSCULAR | Status: DC

## 2011-06-09 MED ORDER — PROPOFOL 10 MG/ML IV EMUL
INTRAVENOUS | Status: AC
  Filled 2011-06-09: qty 20

## 2011-06-09 MED ORDER — FENTANYL CITRATE 0.05 MG/ML IJ SOLN
INTRAMUSCULAR | Status: AC
  Filled 2011-06-09: qty 5

## 2011-06-09 MED ORDER — MIDAZOLAM HCL 2 MG/2ML IJ SOLN
INTRAMUSCULAR | Status: AC
  Filled 2011-06-09: qty 2

## 2011-06-09 MED ORDER — SODIUM CHLORIDE 0.9 % IR SOLN
Freq: Once | Status: DC
  Filled 2011-06-09: qty 1

## 2011-06-09 MED ORDER — LIDOCAINE HCL (CARDIAC) 20 MG/ML IV SOLN
INTRAVENOUS | Status: AC
  Filled 2011-06-09: qty 5

## 2011-06-09 MED ORDER — OXYCODONE-ACETAMINOPHEN 5-325 MG PO TABS: 1.0000 | ORAL_TABLET | ORAL | Status: DC | PRN

## 2011-06-09 MED ORDER — SODIUM CHLORIDE 0.9 % IR SOLN
Status: DC | PRN
  Administered 2011-06-09: 09:00:00

## 2011-06-09 MED ORDER — IBUPROFEN 600 MG PO TABS
600.0000 mg | ORAL_TABLET | Freq: Four times a day (QID) | ORAL | Status: DC | PRN
  Administered 2011-06-10: 600 mg via ORAL
  Filled 2011-06-09 (×2): qty 1

## 2011-06-09 MED ORDER — GLYCOPYRROLATE 0.2 MG/ML IJ SOLN
INTRAMUSCULAR | Status: AC
  Filled 2011-06-09: qty 2

## 2011-06-09 MED ORDER — HYDROMORPHONE HCL PF 1 MG/ML IJ SOLN
INTRAMUSCULAR | Status: AC
  Filled 2011-06-09: qty 1

## 2011-06-09 MED ORDER — LIDOCAINE-EPINEPHRINE (PF) 1 %-1:200000 IJ SOLN
INTRAMUSCULAR | Status: AC
  Filled 2011-06-09: qty 10

## 2011-06-09 MED ORDER — LACTATED RINGERS IV SOLN
INTRAVENOUS | Status: DC
  Administered 2011-06-09 – 2011-06-10 (×3): via INTRAVENOUS

## 2011-06-09 MED ORDER — NEOSTIGMINE METHYLSULFATE 1 MG/ML IJ SOLN
INTRAMUSCULAR | Status: AC
  Filled 2011-06-09: qty 10

## 2011-06-09 MED ORDER — ONDANSETRON HCL 4 MG/2ML IJ SOLN
INTRAMUSCULAR | Status: DC | PRN
  Administered 2011-06-09: 4 mg via INTRAVENOUS

## 2011-06-09 MED ORDER — LIDOCAINE-EPINEPHRINE (PF) 1 %-1:200000 IJ SOLN
INTRAMUSCULAR | Status: DC | PRN
  Administered 2011-06-09: 30 mL
  Administered 2011-06-09: 2 mL

## 2011-06-09 MED ORDER — HYDROMORPHONE HCL PF 1 MG/ML IJ SOLN
0.2000 mg | INTRAMUSCULAR | Status: DC | PRN
  Administered 2011-06-09: 0.5 mg via INTRAVENOUS
  Filled 2011-06-09: qty 1

## 2011-06-09 MED ORDER — HYDROCODONE-ACETAMINOPHEN 5-325 MG PO TABS: 1.0000 | ORAL_TABLET | Freq: Four times a day (QID) | ORAL | Status: DC | PRN

## 2011-06-09 MED ORDER — HYDROMORPHONE HCL PF 1 MG/ML IJ SOLN
0.2500 mg | INTRAMUSCULAR | Status: DC | PRN
  Administered 2011-06-09 (×2): 0.5 mg via INTRAVENOUS

## 2011-06-09 MED ORDER — KETOROLAC TROMETHAMINE 30 MG/ML IJ SOLN
30.0000 mg | Freq: Four times a day (QID) | INTRAMUSCULAR | Status: DC
  Administered 2011-06-09 (×2): 30 mg via INTRAVENOUS
  Filled 2011-06-09 (×3): qty 1

## 2011-06-09 MED ORDER — STERILE WATER FOR IRRIGATION IR SOLN
Status: DC | PRN
  Administered 2011-06-09: 1000 mL via INTRAVESICAL

## 2011-06-09 MED ORDER — FENTANYL CITRATE 0.05 MG/ML IJ SOLN
INTRAMUSCULAR | Status: DC | PRN
  Administered 2011-06-09: 150 ug via INTRAVENOUS
  Administered 2011-06-09 (×2): 50 ug via INTRAVENOUS

## 2011-06-09 MED ORDER — GLYCOPYRROLATE 0.2 MG/ML IJ SOLN
INTRAMUSCULAR | Status: DC | PRN
  Administered 2011-06-09: 0.4 mg via INTRAVENOUS

## 2011-06-09 MED ORDER — ESTRADIOL 0.1 MG/GM VA CREA
TOPICAL_CREAM | VAGINAL | Status: DC | PRN
  Administered 2011-06-09: 2 via VAGINAL

## 2011-06-09 MED ORDER — NEOSTIGMINE METHYLSULFATE 1 MG/ML IJ SOLN
INTRAMUSCULAR | Status: DC | PRN
  Administered 2011-06-09: 2 mg via INTRAVENOUS

## 2011-06-09 MED ORDER — HYDROMORPHONE HCL PF 1 MG/ML IJ SOLN
INTRAMUSCULAR | Status: AC
  Administered 2011-06-09: 0.5 mg via INTRAVENOUS
  Filled 2011-06-09: qty 1

## 2011-06-09 MED ORDER — 0.9 % SODIUM CHLORIDE (POUR BTL) OPTIME
TOPICAL | Status: DC | PRN
  Administered 2011-06-09: 1000 mL

## 2011-06-09 SURGICAL SUPPLY — 67 items
ADH SKN CLS APL DERMABOND .7 (GAUZE/BANDAGES/DRESSINGS)
BLADE SURG 15 STRL LF C SS BP (BLADE) ×3 IMPLANT
BLADE SURG 15 STRL SS (BLADE) ×4
CANISTER SUCTION 2500CC (MISCELLANEOUS) ×8 IMPLANT
CATH FOLEY 2WAY SLVR  5CC 16FR (CATHETERS)
CATH FOLEY 2WAY SLVR 5CC 16FR (CATHETERS) IMPLANT
CATH ROBINSON RED A/P 16FR (CATHETERS) IMPLANT
CLOTH BEACON ORANGE TIMEOUT ST (SAFETY) ×4 IMPLANT
CONT PATH 16OZ SNAP LID 3702 (MISCELLANEOUS) ×2 IMPLANT
DECANTER SPIKE VIAL GLASS SM (MISCELLANEOUS) ×6 IMPLANT
DERMABOND ADVANCED (GAUZE/BANDAGES/DRESSINGS)
DERMABOND ADVANCED .7 DNX12 (GAUZE/BANDAGES/DRESSINGS) IMPLANT
DEVICE CAPIO SUTURING (INSTRUMENTS) ×1
DEVICE CAPIO SUTURING OPC (INSTRUMENTS) ×1 IMPLANT
DRAIN PENROSE 1/4X12 LTX (DRAIN) ×4 IMPLANT
DRAPE UNDERBUTTOCKS STRL (DRAPE) ×4 IMPLANT
ELECT LIGASURE SHORT 9 REUSE (ELECTRODE) ×4 IMPLANT
GAUZE PACKING 2X5 YD STERILE (GAUZE/BANDAGES/DRESSINGS) ×4 IMPLANT
GAUZE SPONGE 4X4 16PLY XRAY LF (GAUZE/BANDAGES/DRESSINGS) ×12 IMPLANT
GLOVE BIO SURGEON STRL SZ7.5 (GLOVE) ×12 IMPLANT
GLOVE BIO SURGEON STRL SZ8 (GLOVE) ×8 IMPLANT
GLOVE ECLIPSE 6.0 STRL STRAW (GLOVE) ×10 IMPLANT
GLOVE ECLIPSE 6.5 STRL STRAW (GLOVE) ×8 IMPLANT
GOWN PREVENTION PLUS LG XLONG (DISPOSABLE) ×22 IMPLANT
GOWN STRL REIN XL XLG (GOWN DISPOSABLE) ×8 IMPLANT
HEMOSTAT SURGICEL 2X14 (HEMOSTASIS) ×2 IMPLANT
NDL MAYO 6 CRC TAPER PT (NEEDLE) IMPLANT
NDL SPNL 20GX3.5 QUINCKE YW (NEEDLE) ×2 IMPLANT
NDL SPNL 22GX3.5 QUINCKE BK (NEEDLE) IMPLANT
NEEDLE HYPO 22GX1.5 SAFETY (NEEDLE) ×4 IMPLANT
NEEDLE MAYO 6 CRC TAPER PT (NEEDLE) ×4 IMPLANT
NEEDLE SPNL 20GX3.5 QUINCKE YW (NEEDLE) ×4 IMPLANT
NEEDLE SPNL 22GX3.5 QUINCKE BK (NEEDLE) IMPLANT
NS IRRIG 1000ML POUR BTL (IV SOLUTION) ×6 IMPLANT
PACK VAGINAL MINOR WOMEN LF (CUSTOM PROCEDURE TRAY) ×2 IMPLANT
PACK VAGINAL WOMENS (CUSTOM PROCEDURE TRAY) ×2 IMPLANT
PENCIL BUTTON HOLSTER BLD 10FT (ELECTRODE) ×4 IMPLANT
PLUG CATH AND CAP STER (CATHETERS) ×4 IMPLANT
RETRACTOR STAY HOOK 5MM (MISCELLANEOUS) ×4 IMPLANT
SET CYSTO W/LG BORE CLAMP LF (SET/KITS/TRAYS/PACK) ×4 IMPLANT
SHEET LAVH (DRAPES) ×4 IMPLANT
SUT CAPIO ETHIBPND (SUTURE) ×4 IMPLANT
SUT CHROMIC 0 CT 1 (SUTURE) IMPLANT
SUT CHROMIC 3 0 SH 27 (SUTURE) IMPLANT
SUT MNCRL 0 MO-4 VIOLET 18 CR (SUTURE) ×8 IMPLANT
SUT MNCRL 0 VIOLET 6X18 (SUTURE) ×3 IMPLANT
SUT MONOCRYL 0 6X18 (SUTURE) ×1
SUT MONOCRYL 0 MO 4 18  CR/8 (SUTURE) ×2
SUT SILK 2 0 FS (SUTURE) IMPLANT
SUT VIC AB 0 CT1 27 (SUTURE) ×16
SUT VIC AB 0 CT1 27XBRD ANBCTR (SUTURE) ×8 IMPLANT
SUT VIC AB 0 CT2 27 (SUTURE) IMPLANT
SUT VIC AB 2-0 CT1 (SUTURE) ×4 IMPLANT
SUT VIC AB 2-0 SH 27 (SUTURE) ×8
SUT VIC AB 2-0 SH 27XBRD (SUTURE) ×2 IMPLANT
SUT VIC AB 3-0 PS2 18 (SUTURE)
SUT VIC AB 3-0 PS2 18XBRD (SUTURE) IMPLANT
SUT VIC AB 3-0 SH 27 (SUTURE)
SUT VIC AB 3-0 SH 27X BRD (SUTURE) IMPLANT
SUT VICRYL 0 UR6 27IN ABS (SUTURE) ×4 IMPLANT
TISSUE REPAIR XENFORM 6X10CM (Tissue) ×2 IMPLANT
TOWEL OR 17X24 6PK STRL BLUE (TOWEL DISPOSABLE) ×14 IMPLANT
TRAY FOLEY CATH 14FR (SET/KITS/TRAYS/PACK) ×6 IMPLANT
TUBING CONNECTING 10 (TUBING) ×4 IMPLANT
TUBING NON-CON 1/4 X 20 CONN (TUBING) ×4 IMPLANT
WATER STERILE IRR 1000ML POUR (IV SOLUTION) ×4 IMPLANT
YANKAUER SUCT BULB TIP NO VENT (SUCTIONS) ×2 IMPLANT

## 2011-06-09 NOTE — Progress Notes (Signed)
Vitals normal Laboratory tests normal Patient alert and stable Pain minimal and well-controlled Post treatment course discussed in detail Followup discussed in detail See orders 

## 2011-06-09 NOTE — Transfer of Care (Signed)
Immediate Anesthesia Transfer of Care Note  Patient: Megan Crane  Procedure(s) Performed: Procedure(s) (LRB): HYSTERECTOMY VAGINAL (N/A) VAGINAL VAULT SUSPENSION (N/A) CYSTOSCOPY (N/A) POSTERIOR REPAIR (RECTOCELE) (N/A)  Patient Location: PACU  Anesthesia Type: General  Level of Consciousness: sedated  Airway & Oxygen Therapy: Patient Spontanous Breathing and Patient connected to nasal cannula oxygen  Post-op Assessment: Report given to PACU RN and Post -op Vital signs reviewed and stable  Post vital signs: stable  Complications: No apparent anesthesia complications

## 2011-06-09 NOTE — Progress Notes (Signed)
UR Chart review completed.  

## 2011-06-09 NOTE — Progress Notes (Signed)
Afebrile, VSS.  Patient feels comfortable.

## 2011-06-09 NOTE — Anesthesia Postprocedure Evaluation (Signed)
  Anesthesia Post-op Note  Patient: Megan Crane  Procedure(s) Performed: Procedure(s) (LRB): HYSTERECTOMY VAGINAL (N/A) VAGINAL VAULT SUSPENSION (N/A) CYSTOSCOPY (N/A) POSTERIOR REPAIR (RECTOCELE) (N/A)  Patient Location: PACU  Anesthesia Type: General  Level of Consciousness: awake, alert  and oriented  Airway and Oxygen Therapy: Patient Spontanous Breathing  Post-op Pain: mild  Post-op Assessment: Post-op Vital signs reviewed, Patient's Cardiovascular Status Stable, Respiratory Function Stable, Patent Airway, No signs of Nausea or vomiting and Pain level controlled  Post-op Vital Signs: Reviewed and stable  Complications: No apparent anesthesia complications

## 2011-06-09 NOTE — Op Note (Signed)
Patient Name: TAYTE CHILDERS MRN: [id number]  Date of Surgery: 06/09/2011    PREOPERATIVE DIAGNOSIS: UTEROVAGINAL PROLAPSE  POSTOPERATIVE DIAGNOSIS: UTEROVAGINAL PROLAPSE   PROCEDURE: Vaginal Hysterectomy, Posterior repair with graft, sacrospinous vaginal vault suspension  SURGEON: Willona Phariss D. Arlyce Dice M.D. and Alfredo Martinez, M.D.  ANESTHESIA: General endotracheal  ESTIMATED BLOOD LOSS: 400 ml  FINDINGS: Normal uterus, large rectocele, prolapse   COMPLICATIONS: None  INDICATIONS:   Patient aware of rectocele protruding through vaginal intoitus which interfered with normal activity and satisfying coitus.   PROCEDURE IN DETAIL:  The lower abdomen, perineum, and vagina were prepped and draped in a sterile fashion.  A weighted speculum was placed and the cervix was grasped with a Jacob's tenaculum.  The para cervical tissue was infiltrated with 1% lidocaine with 1:200,000 epinephrine.  The cervix was circumcised.  The vaginal mucosa was advanced until the posterior cul de sac was identified and entered sharply.  The uterosacral ligaments were clamped, cut, tied, and held bilaterally.  The remainder of the Cardinal ligaments were clamped with a Ligasure clamp, cauterized, and cut.  The anterior cul de sac was identified and entered.  The uterine arteries and lower Broad ligament were clamped, cauterized, and cut.  The uterine-ovarian anastomosis and remaining Broad ligament structures were clamped, cauterized, and transected.  The specimen was removed and sent to pathology.  The posterior vaginal cuff was whipped with an interlocking 0 Vicryl suture.  At this point Dr. Sherron Monday became the primary surgeon and will dictate the remainder of the procedure.

## 2011-06-09 NOTE — Op Note (Signed)
Preoperative diagnosis: Vault prolapse; small cystocele; rectocele Postoperative diagnosis: Vault prolapse; small cystocele; rectocele Surgery: Vault prolapse repair plus rectocele repair plus graft plus cystoscopy Surgeon: Dr. Lorin Picket Shade Kaley  Megan Crane had urine vault prolapse. She underwent a hysterectomy by Dr. Arlyce Dice. The cuff was left open and the posterior cuff was run with Vicryl suture. The ureteral sacral ligaments were tagged  Preoperative laboratory tests were normal. Preoperative antibiotics were given. Extra extra care was taken with leg positioning to minimize the risk of compartment syndrome neuropathy and deep vein thrombosis  Megan Crane had a very large posterior defect filling her vaginal vault and exiting the introitus. It was almost like she had a large enterocele or sigmoid a sealed. 22 mL of a lidocaine epinephrine mixture was utilized. I placed an Allis clamp on the hymenal ring at 5 and 7:00 and removed a small triangle of perineal skin. I then did a long anterior posterior vaginal wall incision between Allis clamps the cuff leaving 1 cm. I sharply and bluntly dissected the thin underlying rectovaginal fascia from the overlying thin mucosal wall to the sidewall bilaterally. I broke through the  Rectal guider bilaterally and could feel the initial spine bilaterally. I swept away soft tissue and could feel the sacrospinous ligament bilaterally  Before the above dissection was completed I did a small site defect apically to reduce redundant pre-peritoneal fat. I then did a gentle to moderate posterior repair with 2-0 Vicryl using the same suture for both. This reduced the large posterior defect. She still had a trapdoor rotational deformity.  I placed a 0 Ethibond approximately approximately 2 cm medial to the initial spine bilaterally in a straight line between the spines. I did a rectal examination prior and afterwards. There is no injury to rectum or suture in the rectum. I  double checked the position and strength of the Ethibond was very pleased with it.  A 10 x 6 dermal graft was prepared and-shaped like a trapezoid. It was sewn in place with the Ethibond sutures. Distally it was sewn gently to the rectovaginal fascia 2 cm proximal to the introitus using 0 Vicryl. It was later in the case pulled up over the apical site defect repair.  The apex was closed with 0 Vicryl starting with each eighth packs and running laterally. The ureteral sacral ligaments were tagged were tied.  She had excellent vaginal length at the end of the case with very good reduction of the posterior defect. She minimal to no cystocele which did not surprise me to the apex dressed.  Leg position was good. Vaginal pack was inserted with Estrace cream. Blood loss was less than 200 mL. I was very pleased the surgery and hopefully it'll reach the patient's treatment goal

## 2011-06-09 NOTE — Anesthesia Preprocedure Evaluation (Signed)
Anesthesia Evaluation  Patient identified by MRN, date of birth, ID band Patient awake    Reviewed: Allergy & Precautions, H&P , NPO status , Patient's Chart, lab work & pertinent test results, reviewed documented beta blocker date and time   History of Anesthesia Complications Negative for: history of anesthetic complications  Airway Mallampati: I TM Distance: >3 FB Neck ROM: full    Dental  (+) Poor Dentition, Caps, Chipped and Missing   Pulmonary neg pulmonary ROS,  breath sounds clear to auscultation  Pulmonary exam normal       Cardiovascular Exercise Tolerance: Good hypertension, On Medications Rhythm:regular Rate:Normal     Neuro/Psych  Headaches, PSYCHIATRIC DISORDERS (anxiety)    GI/Hepatic negative GI ROS, Neg liver ROS,   Endo/Other  Morbid obesity  Renal/GU negative Renal ROS  negative genitourinary   Musculoskeletal   Abdominal   Peds  Hematology negative hematology ROS (+)   Anesthesia Other Findings Xylocaine - "made her eyes cross" when given for dental work.  No problem when being numbed for vaginal deliveries.  Reproductive/Obstetrics negative OB ROS                           Anesthesia Physical Anesthesia Plan  ASA: III  Anesthesia Plan: General ETT   Post-op Pain Management:    Induction:   Airway Management Planned:   Additional Equipment:   Intra-op Plan:   Post-operative Plan:   Informed Consent: I have reviewed the patients History and Physical, chart, labs and discussed the procedure including the risks, benefits and alternatives for the proposed anesthesia with the patient or authorized representative who has indicated his/her understanding and acceptance.   Dental Advisory Given  Plan Discussed with: CRNA and Surgeon  Anesthesia Plan Comments:         Anesthesia Quick Evaluation

## 2011-06-10 LAB — BASIC METABOLIC PANEL
Calcium: 9.1 mg/dL (ref 8.4–10.5)
Chloride: 106 mEq/L (ref 96–112)
Creatinine, Ser: 0.72 mg/dL (ref 0.50–1.10)
GFR calc Af Amer: >90 mL/min (ref >90)

## 2011-06-10 LAB — CBC
MCV: 90.3 fL (ref 78.0–100.0)
Platelets: 152 10*3/uL (ref 150–400)
RDW: 13.6 % (ref 11.5–15.5)
WBC: 9.8 10*3/uL (ref 4.0–10.5)

## 2011-06-10 MED ORDER — OLMESARTAN MEDOXOMIL 20 MG PO TABS: 20.0000 mg | ORAL_TABLET | Freq: Every day | ORAL | Status: DC

## 2011-06-10 MED ORDER — ACETAMINOPHEN-CODEINE #3 300-30 MG PO TABS: 1.0000 | ORAL_TABLET | ORAL | Status: AC | PRN

## 2011-06-10 NOTE — Discharge Instructions (Signed)
Hysterectomy Care After Refer to this sheet in the next few weeks. These instructions provide you with information on caring for yourself after your procedure. Your caregiver may also give you more specific instructions. Your treatment has been planned according to current medical practices, but problems sometimes occur. Call your caregiver if you have any problems or questions after your procedure. HOME CARE INSTRUCTIONS  Healing will take time. You may have discomfort, tenderness, swelling, and bruising at the surgical site for about 2 weeks. This is normal and will get better as time goes on.  Only take over-the-counter or prescription medicines for pain, discomfort, or fever as directed by your caregiver.   Do not take aspirin. It can cause bleeding.   Do not drive when taking pain medicine.   Follow your caregiver's advice regarding exercise, lifting, driving, and general activities.   Resume your usual diet as directed and allowed.   Get plenty of rest and sleep.   Do not douche, use tampons, or have sexual intercourse for at least 6 weeks or until your caregiver gives you permission.   Change your bandages (dressings) as directed by your caregiver.   Monitor your temperature.   Take showers instead of baths for 2 to 3 weeks.   Do not drink alcohol until your caregiver gives you permission.   If you are constipated, you may take a mild laxative with your caregiver's permission. Bran foods may help with constipation problems. Drinking enough fluids to keep your urine clear or pale yellow may help as well.   Try to have someone home with you for 1 or 2 weeks to help around the house.   Keep all of your follow-up appointments as directed by your caregiver.  SEEK MEDICAL CARE IF:   You have swelling, redness, or increasing pain in the surgical cut (incision) area.   You have pus coming from the incision.   You notice a bad smell coming from the incision or dressing.   You  have swelling, redness, or pain around the intravenous (IV) site.   Your incision breaks open.   You feel dizzy or lightheaded.   You have pain or bleeding when you urinate.   You have persistent diarrhea.   You have persistent nausea and vomiting.   You have abnormal vaginal discharge.   You have a rash.   You have any type of abnormal reaction or develop an allergy to your medicine.   Your pain is not controlled with your prescribed medicine.  SEEK IMMEDIATE MEDICAL CARE IF:   You have a fever.   You have severe abdominal pain.   You have chest pain.   You have shortness of breath.   You faint.   You have pain, swelling, or redness of your leg.   You have heavy vaginal bleeding with blood clots.  MAKE SURE YOU:  Understand these instructions.   Will watch your condition.   Will get help right away if you are not doing well or get worse.  Document Released: 07/15/2004 Document Revised: 12/15/2010 Document Reviewed: 08/12/2010 [employer] Patient Information 2012 [employer]. 

## 2011-06-10 NOTE — Progress Notes (Signed)
Vaginal packing removed as ordered, moderate amount dark vaginal drainage noted on packing with 2 very small clots on packing.  Patient tolerated well.  Foley cath removed as ordered

## 2011-06-10 NOTE — Discharge Summary (Signed)
Physician Discharge Summary  Patient ID: Megan Crane MRN: [id number] DOB/AGE: 06-09-1950 61 y.o.  Admit date: 06/09/2011 Discharge date: 06/10/2011  Admission Diagnoses: Rectocele, Pelvic Prolapse  Discharge Diagnoses: s/p vaginal hysterectomy, rectocele repair with graft, sacrospinous vaginal vault suspension.   Active Problems:  * No active hospital problems. *    Discharged Condition: good  Hospital Course: Admitted for post operative observation.  On the morning of post op day 1 the patient was voiding, tolerating oral intake, passing flatus, ambulating, and her pain was well controlled.  Her blood pressures were low but she was asymptomatic, her pulse was in the 60's, and her urine output was normal.  She had no signs or symptoms of hypovolemia.  Her Hb was 9.7 gm which was consistent with intraoperative blood loss.  Treatments: Vaginal hysterectomy Arlyce Dice), Rectocele repair and vaginal vault suspension with graft (MacDiarmid).  Discharge Exam: Blood pressure 82/53, pulse 59, temperature 98.3 F (36.8 C), temperature source Oral, resp. rate 16, height 5' 6.5" (1.689 m), weight 220 lb (99.791 kg), SpO2 98.00%. Abdomen is soft and non tender.  Minimal vaginal bleeding.  Disposition: Discharged to home  Discharge Orders    Future Orders Please Complete By Expires   Discharge patient        Medication List  As of 06/10/2011 12:26 PM   TAKE these medications         cetirizine 10 MG tablet   Commonly known as: ZYRTEC   Take 10 mg by mouth every morning.      estradiol 0.1 MG/GM vaginal cream   Commonly known as: ESTRACE   Place 1 g vaginally 2 (two) times a week.      olmesartan 20 MG tablet   Commonly known as: BENICAR   Take 1 tablet (20 mg total) by mouth daily.      pediatric multivitamin chewable tablet   Chew 2 tablets by mouth every morning. [place] chewables           Follow-up Information    Follow up with Mickel Baas, MD in 3 weeks.   Contact  information:   [address] [place] [address] [phone]          Signed: Mickel Baas 06/10/2011, 12:26 PM

## 2011-06-10 NOTE — Anesthesia Postprocedure Evaluation (Signed)
  Anesthesia Post-op Note  Patient: Megan Crane  Procedure(s) Performed: Procedure(s) (LRB): HYSTERECTOMY VAGINAL (N/A) VAGINAL VAULT SUSPENSION (N/A) CYSTOSCOPY (N/A) POSTERIOR REPAIR (RECTOCELE) (N/A)  Patient Location: PAC[facility]  Anesthesia Type: General  Level of Consciousness: awake, alert  and oriented  Airway and Oxygen Therapy: Patient Spontanous Breathing  Post-op Pain: mild  Post-op Assessment: Post-op Vital signs reviewed and Patient's Cardiovascular Status Stable  Post-op Vital Signs: Reviewed and stable  Complications: No apparent anesthesia complications

## 2011-06-10 NOTE — Progress Notes (Signed)
Discharge instructions reviewed with patient.  Patient states understanding of home care and signs/symptoms to report to MD.  No home equipment needed.  Ambulated to car with staff without incident.   

## 2011-06-10 NOTE — Addendum Note (Signed)
Addendum  created 06/10/11 1020 by Orlie Pollen, CRNA   Modules edited:Notes Section

## 2011-06-12 ENCOUNTER — Encounter (HOSPITAL_COMMUNITY): Payer: Self-pay | Admitting: Obstetrics & Gynecology

## 2011-06-20 ENCOUNTER — Telehealth: Payer: Self-pay | Admitting: Family Medicine

## 2011-06-20 NOTE — Telephone Encounter (Signed)
Pt had hysterectomy and repair rectocele  on 06-09-2011 and last bp med was taken on 06-10-2011. Pt is on benicar. one bp reading was on 06-19-2011 at 3am 117/72 and 109/71 around 8am on 06-19-11. Pt is not feeling lightheaded,dizziness nor SOB. Pt would like to know should she resume her benicar.

## 2011-06-20 NOTE — Telephone Encounter (Signed)
For right now I would stay off the Benicar and just watch the BP. If it goes back up above 140 systolic or 90 diastolic. Let me know

## 2011-06-20 NOTE — Telephone Encounter (Signed)
I spoke with pt  

## 2011-06-21 ENCOUNTER — Ambulatory Visit (INDEPENDENT_AMBULATORY_CARE_PROVIDER_SITE_OTHER): Payer: BC Managed Care – PPO | Admitting: Family Medicine

## 2011-06-21 ENCOUNTER — Encounter: Payer: Self-pay | Admitting: Family Medicine

## 2011-06-21 VITALS — BP 120/78 | HR 93 | Temp 99.0°F | Wt 222.0 lb

## 2011-06-21 DIAGNOSIS — I10 Essential (primary) hypertension: Secondary | ICD-10-CM

## 2011-06-21 DIAGNOSIS — L740 Miliaria rubra: Secondary | ICD-10-CM

## 2011-06-21 DIAGNOSIS — R509 Fever, unspecified: Secondary | ICD-10-CM

## 2011-06-21 LAB — POCT URINALYSIS DIPSTICK
Bilirubin, UA: NEGATIVE
Leukocytes, UA: NEGATIVE
Nitrite, UA: NEGATIVE
Protein, UA: NEGATIVE
pH, UA: 6

## 2011-06-21 NOTE — Progress Notes (Signed)
  Subjective:    Patient ID: Megan Crane, female    DOB: 06/30/1950, 61 y.o.   MRN: [id number]  HPI Here to ask about HTN and about an itchy rash she has had for about a week. She recently had a hysterectomy and rectocele repair, and ever since she got home from the hospital she has had this rash. Also she was told to stop taking her Benicar because her BP was dropping too low. Since she got home her BP has been stable and she has felt fine.    Review of Systems  Constitutional: Negative.   Respiratory: Negative.   Cardiovascular: Negative.   Skin: Positive for rash.       Objective:   Physical Exam  Constitutional: She appears well-developed and well-nourished.  Cardiovascular: Normal rate, regular rhythm, normal heart sounds and intact distal pulses.   Pulmonary/Chest: Effort normal and breath sounds normal.  Skin:       There are a few scattered red papules over the forearms and the inner thighs           Assessment & Plan:  Her BP is well controlled off the Benicar so she will stay off  this for now. The rash appears to be a heat rash. Try OTC hydrocortisone cream prn.

## 2011-06-21 NOTE — Addendum Note (Signed)
Addended by: Aniceto Boss A on: 06/21/2011 04:13 PM   Modules accepted: Orders

## 2011-08-01 ENCOUNTER — Ambulatory Visit (INDEPENDENT_AMBULATORY_CARE_PROVIDER_SITE_OTHER): Payer: BC Managed Care – PPO | Admitting: Family Medicine

## 2011-08-01 ENCOUNTER — Encounter: Payer: Self-pay | Admitting: Family Medicine

## 2011-08-01 VITALS — BP 118/74 | HR 85 | Temp 98.8°F | Wt 233.0 lb

## 2011-08-01 DIAGNOSIS — R600 Localized edema: Secondary | ICD-10-CM

## 2011-08-01 DIAGNOSIS — R609 Edema, unspecified: Secondary | ICD-10-CM

## 2011-08-01 DIAGNOSIS — R350 Frequency of micturition: Secondary | ICD-10-CM

## 2011-08-01 DIAGNOSIS — N390 Urinary tract infection, site not specified: Secondary | ICD-10-CM

## 2011-08-01 LAB — POCT URINALYSIS DIPSTICK
Blood, UA: NEGATIVE
Nitrite, UA: NEGATIVE
Protein, UA: NEGATIVE
Urobilinogen, UA: 0.2
pH, UA: 7

## 2011-08-01 MED ORDER — SULFAMETHOXAZOLE-TRIMETHOPRIM 800-160 MG PO TABS: 1.0000 | ORAL_TABLET | Freq: Two times a day (BID) | ORAL | Status: AC

## 2011-08-01 MED ORDER — FUROSEMIDE 20 MG PO TABS: 20.0000 mg | ORAL_TABLET | Freq: Every day | ORAL | Status: DC

## 2011-08-01 NOTE — Progress Notes (Signed)
  Subjective:    Patient ID: Megan Crane, female    DOB: March 16, 1950, 61 y.o.   MRN: [id number]  HPI Here for 2 things. First she has been swelling in the feet and ankles for the past month or so. Not painful. Not SOB. Also for one week she has had urinary urgency and burning. No nausea or fever.    Review of Systems  Constitutional: Negative.   Respiratory: Negative.   Cardiovascular: Positive for leg swelling. Negative for chest pain and palpitations.  Gastrointestinal: Negative.   Genitourinary: Positive for dysuria and frequency. Negative for hematuria, flank pain and pelvic pain.       Objective:   Physical Exam  Constitutional: She appears well-developed and well-nourished.  Neck: No thyromegaly present.  Cardiovascular: Normal rate, regular rhythm, normal heart sounds and intact distal pulses.   Pulmonary/Chest: Effort normal and breath sounds normal.  Abdominal: Soft. Bowel sounds are normal. She exhibits no distension and no mass. There is no tenderness. There is no rebound and no guarding.  Musculoskeletal:       2+ edema to both lower legs   Lymphadenopathy:    She has no cervical adenopathy.          Assessment & Plan:  Use Bactrim for the UTI and use Lasix for the edema. Recheck prn

## 2011-08-18 ENCOUNTER — Encounter: Payer: Self-pay | Admitting: Family Medicine

## 2011-08-18 ENCOUNTER — Ambulatory Visit (INDEPENDENT_AMBULATORY_CARE_PROVIDER_SITE_OTHER): Payer: BC Managed Care – PPO | Admitting: Family Medicine

## 2011-08-18 VITALS — BP 130/90 | Temp 98.2°F | Wt 231.0 lb

## 2011-08-18 DIAGNOSIS — R300 Dysuria: Secondary | ICD-10-CM

## 2011-08-18 DIAGNOSIS — N390 Urinary tract infection, site not specified: Secondary | ICD-10-CM

## 2011-08-18 LAB — POCT URINALYSIS DIPSTICK
Blood, UA: NEGATIVE
Protein, UA: NEGATIVE
Spec Grav, UA: 1.01
Urobilinogen, UA: 0.2

## 2011-08-18 MED ORDER — NITROFURANTOIN MONOHYD MACRO 100 MG PO CAPS: 100.0000 mg | ORAL_CAPSULE | Freq: Two times a day (BID) | ORAL | Status: AC

## 2011-08-18 NOTE — Progress Notes (Signed)
  Subjective:    Patient ID: Megan Crane, female    DOB: 03/01/1950, 61 y.o.   MRN: [id number]  HPI Here for 3 days of urinary urgency and burning. No fever or nausea. She was here for similar symptoms a few weeks ago and was given Bactrim DS. This seemed to work until the past 3 days. Drinking water.    Review of Systems  Constitutional: Negative.   Gastrointestinal: Negative.   Genitourinary: Positive for dysuria, urgency and frequency.       Objective:   Physical Exam  Constitutional: She appears well-developed and well-nourished.  Abdominal: Soft. Bowel sounds are normal. She exhibits no distension and no mass. There is no tenderness. There is no rebound and no guarding.          Assessment & Plan:  Treat with Macrobid. Send the urine for a culture.

## 2011-08-20 LAB — URINE CULTURE

## 2011-08-23 ENCOUNTER — Telehealth: Payer: Self-pay | Admitting: Family Medicine

## 2011-08-23 ENCOUNTER — Telehealth: Payer: Self-pay

## 2011-08-23 NOTE — Telephone Encounter (Signed)
Opened in error

## 2011-08-23 NOTE — Telephone Encounter (Signed)
Patient called stating that she would like a call back today with urine culture results. Please assist.

## 2011-08-23 NOTE — Telephone Encounter (Signed)
Spoke with tp 0 informed of results and to f/u in sx persist. KIK

## 2011-08-23 NOTE — Progress Notes (Signed)
Quick Note:  Spoke with pt- aware of resutls ______

## 2011-08-23 NOTE — Telephone Encounter (Signed)
See the report. She can follow up if still symptomatic.

## 2011-09-01 ENCOUNTER — Ambulatory Visit (INDEPENDENT_AMBULATORY_CARE_PROVIDER_SITE_OTHER): Payer: BC Managed Care – PPO | Admitting: Family Medicine

## 2011-09-01 ENCOUNTER — Encounter: Payer: Self-pay | Admitting: Family Medicine

## 2011-09-01 VITALS — BP 124/72 | HR 101 | Temp 99.1°F | Wt 232.0 lb

## 2011-09-01 DIAGNOSIS — N3281 Overactive bladder: Secondary | ICD-10-CM

## 2011-09-01 DIAGNOSIS — N318 Other neuromuscular dysfunction of bladder: Secondary | ICD-10-CM

## 2011-09-01 DIAGNOSIS — R350 Frequency of micturition: Secondary | ICD-10-CM

## 2011-09-01 LAB — POCT URINALYSIS DIPSTICK
Bilirubin, UA: NEGATIVE
Glucose, UA: NEGATIVE
Nitrite, UA: NEGATIVE
Spec Grav, UA: 1.015
Urobilinogen, UA: 0.2

## 2011-09-01 NOTE — Progress Notes (Signed)
  Subjective:    Patient ID: Megan Crane, female    DOB: 03-04-1950, 61 y.o.   MRN: [id number]  HPI Here to check her urine again to see if she has an infection. She has bee treated several times in the past few months for UTIs. Each time she feels fine after the antibiotics are done. Now for the past few days she has an urgency to urinate and sometimes caanot make it to the bathroom in time before she has an accident. No burning or discomfort.    Review of Systems  Constitutional: Negative.   Gastrointestinal: Negative.   Genitourinary: Positive for urgency. Negative for dysuria, frequency, hematuria, flank pain, difficulty urinating and pelvic pain.       Objective:   Physical Exam  Constitutional: She appears well-developed and well-nourished.  Abdominal: Soft. Bowel sounds are normal. She exhibits no distension and no mass. There is no tenderness. There is no rebound and no guarding.          Assessment & Plan:  This looks like overactive bladder, so we will have her try samples of Vesicare 10 mg daily for 2 weeks. Recheck after that. She is due to see Dr. McDiarmid sometime soon.

## 2011-09-04 ENCOUNTER — Encounter: Payer: Self-pay | Admitting: Family Medicine

## 2011-09-04 MED ORDER — SOLIFENACIN SUCCINATE 10 MG PO TABS: 10.0000 mg | ORAL_TABLET | Freq: Every day | ORAL | Status: DC

## 2011-12-01 ENCOUNTER — Ambulatory Visit (INDEPENDENT_AMBULATORY_CARE_PROVIDER_SITE_OTHER): Payer: BC Managed Care – PPO | Admitting: Family Medicine

## 2011-12-01 ENCOUNTER — Encounter: Payer: Self-pay | Admitting: Family Medicine

## 2011-12-01 VITALS — BP 144/90 | HR 84 | Temp 99.1°F | Wt 225.0 lb

## 2011-12-01 DIAGNOSIS — R82998 Other abnormal findings in urine: Secondary | ICD-10-CM

## 2011-12-01 DIAGNOSIS — J329 Chronic sinusitis, unspecified: Secondary | ICD-10-CM

## 2011-12-01 DIAGNOSIS — R8290 Unspecified abnormal findings in urine: Secondary | ICD-10-CM

## 2011-12-01 LAB — POCT URINALYSIS DIPSTICK
Glucose, UA: NEGATIVE
Ketones, UA: NEGATIVE
Leukocytes, UA: NEGATIVE
Protein, UA: NEGATIVE
Urobilinogen, UA: 0.2

## 2011-12-01 MED ORDER — AZITHROMYCIN 250 MG PO TABS: ORAL_TABLET | ORAL | Status: DC

## 2011-12-01 NOTE — Progress Notes (Signed)
  Subjective:    Patient ID: Megan Crane, female    DOB: September 22, 1950, 61 y.o.   MRN: [id number]  HPI Here for 2 weeks of stuffy head, PND, ST, and a dry cough. Low grade fevers.    Review of Systems  Constitutional: Positive for fever.  HENT: Positive for congestion, postnasal drip and sinus pressure.   Eyes: Negative.   Respiratory: Positive for cough.        Objective:   Physical Exam  Constitutional: She appears well-developed and well-nourished.  HENT:  Right Ear: External ear normal.  Left Ear: External ear normal.  Nose: Nose normal.  Mouth/Throat: Oropharynx is clear and moist.  Eyes: Conjunctivae normal are normal.  Pulmonary/Chest: Effort normal and breath sounds normal.  Lymphadenopathy:    She has no cervical adenopathy.          Assessment & Plan:  Add Mucinex

## 2011-12-25 ENCOUNTER — Telehealth: Payer: Self-pay | Admitting: Family Medicine

## 2011-12-25 NOTE — Telephone Encounter (Signed)
Pt was given samples of vesicare 10 mg [facility].

## 2011-12-27 MED ORDER — SOLIFENACIN SUCCINATE 10 MG PO TABS: 10.0000 mg | ORAL_TABLET | Freq: Every day | ORAL | Status: DC

## 2011-12-27 NOTE — Telephone Encounter (Signed)
I sent script e-scribe. 

## 2012-04-09 ENCOUNTER — Encounter: Payer: Self-pay | Admitting: Family Medicine

## 2012-04-09 ENCOUNTER — Ambulatory Visit (INDEPENDENT_AMBULATORY_CARE_PROVIDER_SITE_OTHER): Payer: BC Managed Care – PPO | Admitting: Family Medicine

## 2012-04-09 VITALS — BP 144/90 | HR 80 | Temp 99.3°F | Wt 221.0 lb

## 2012-04-09 DIAGNOSIS — J019 Acute sinusitis, unspecified: Secondary | ICD-10-CM

## 2012-04-09 DIAGNOSIS — R3915 Urgency of urination: Secondary | ICD-10-CM

## 2012-04-09 LAB — POCT URINALYSIS DIPSTICK
Glucose, UA: NEGATIVE
Nitrite, UA: NEGATIVE
Urobilinogen, UA: 0.2

## 2012-04-09 MED ORDER — LORATADINE 10 MG PO TABS: 10.0000 mg | ORAL_TABLET | Freq: Every day | ORAL | Status: DC

## 2012-04-09 MED ORDER — AZITHROMYCIN 250 MG PO TABS: ORAL_TABLET | ORAL | Status: DC

## 2012-04-09 MED ORDER — FLUTICASONE PROPIONATE 50 MCG/ACT NA SUSP: 2.0000 | Freq: Every day | NASAL | Status: DC

## 2012-04-09 NOTE — Progress Notes (Signed)
  Subjective:    Patient ID: Megan Crane, female    DOB: 28-May-1950, 62 y.o.   MRN: [id number]  HPI Here for 4 days of sinus pressure, ear pressure, PND, and ST. No fever or cough. Using Flonase daily.    Review of Systems  Constitutional: Negative.   HENT: Positive for congestion, postnasal drip and sinus pressure.   Eyes: Negative.   Respiratory: Negative.        Objective:   Physical Exam  Constitutional: She appears well-developed and well-nourished.  HENT:  Right Ear: External ear normal.  Left Ear: External ear normal.  Nose: Nose normal.  Mouth/Throat: Oropharynx is clear and moist.  Eyes: Conjunctivae are normal.  Pulmonary/Chest: Effort normal and breath sounds normal.  Lymphadenopathy:    She has no cervical adenopathy.          Assessment & Plan:  Add Claritin daily.

## 2012-04-09 NOTE — Addendum Note (Signed)
Addended by: Aniceto Boss A on: 04/09/2012 03:16 PM   Modules accepted: Orders

## 2012-04-12 ENCOUNTER — Telehealth: Payer: Self-pay | Admitting: Family Medicine

## 2012-04-12 ENCOUNTER — Ambulatory Visit (INDEPENDENT_AMBULATORY_CARE_PROVIDER_SITE_OTHER): Payer: BC Managed Care – PPO | Admitting: Family Medicine

## 2012-04-12 ENCOUNTER — Encounter: Payer: Self-pay | Admitting: Family Medicine

## 2012-04-12 VITALS — BP 170/100 | HR 103 | Temp 99.4°F | Wt 222.0 lb

## 2012-04-12 DIAGNOSIS — I10 Essential (primary) hypertension: Secondary | ICD-10-CM

## 2012-04-12 MED ORDER — OLMESARTAN MEDOXOMIL-HCTZ 20-12.5 MG PO TABS: 1.0000 | ORAL_TABLET | Freq: Every day | ORAL | Status: DC

## 2012-04-12 NOTE — Telephone Encounter (Signed)
Patient Information:  Caller Name: Anne Ng  Phone: [phone]  Patient: Megan Crane  Gender: Female  DOB: 10-22-1950  Age: 62 Years  PCP: Gershon Crane [facility])  Office Follow Up:  Does the office need to follow up with this patient?: No  Instructions For The Office: N/A  RN Note:  Spoke with [facility] in office, may offer 2:15 pm with Dr Abran Cantor, may need to wait until that Appt time due to current schedule.  If feeling needing to be evaluated right away, may go ahead to [facility].  Patient and daugher decided to wait unti 2:15 pm so Appt made.  Symptoms  Reason For Call & Symptoms: Seen in office last week wtih ears feeling full, started Azithromycin.  Has been off BP meds since hysterectomy 2-3 months ago.  This am 4/4  slight dizziness, BP at 168/98.  Daughter with her, can can starnd walk okay, feels she is panicky due to BP reading.  Told me she is enroute to office now, is about half way here  Reviewed Health History In EMR: Yes  Reviewed Medications In EMR: Yes  Reviewed Allergies In EMR: Yes  Reviewed Surgeries / Procedures: Yes  Date of Onset of Symptoms: 04/12/2012  Guideline(s) Used:  High Blood Pressure  Disposition Per Guideline:   See Today in Office  Reason For Disposition Reached:   Patient wants to be seen  Advice Given:  N/A  Patient Will Follow Care Advice:  YES  Appointment Scheduled:  04/12/2012 14:15:00 Appointment Scheduled Provider:  Gershon Crane [facility])

## 2012-04-12 NOTE — Progress Notes (Signed)
  Subjective:    Patient ID: Megan Crane, female    DOB: 02-Apr-1950, 62 y.o.   MRN: [id number]  HPI Here for elevated BP. She had been on Benicar up until 2 years ago but it was stopped. Her BP had been stable until last fall. At her last few visits here it was borderline in the 140s over 90s. Then today she felt a fullness in the head and checked her BP, and it was 170/100. No chest pain or SOB.    Review of Systems  Constitutional: Negative.   HENT: Negative.   Respiratory: Negative.   Cardiovascular: Negative.   Neurological: Positive for light-headedness. Negative for dizziness.       Objective:   Physical Exam  Constitutional: She appears well-developed and well-nourished. No distress.  HENT:  Right Ear: External ear normal.  Left Ear: External ear normal.  Nose: Nose normal.  Mouth/Throat: Oropharynx is clear and moist.  Eyes: Conjunctivae are normal. Pupils are equal, round, and reactive to light.  Neck: No thyromegaly present.  Cardiovascular: Normal rate, regular rhythm, normal heart sounds and intact distal pulses.   Pulmonary/Chest: Effort normal and breath sounds normal.          Assessment & Plan:  Her HTN needs to be treated again so we will start her on Benicar HCT daily. Recheck in 3 weeks

## 2012-04-18 ENCOUNTER — Telehealth: Payer: Self-pay | Admitting: Family Medicine

## 2012-04-18 NOTE — Telephone Encounter (Signed)
Patient Information:  Caller Name: Emmamae  Phone: [phone]  Patient: Megan Crane, Megan Crane  Gender: Female  DOB: 06-Jun-1950  Age: 62 Years  PCP: Gershon Crane [facility])  Office Follow Up:  Does the office need to follow up with this patient?: Yes  Instructions For The Office: [facility].   Symptoms  Reason For Call & Symptoms: Patient states she is still exerpiencing  ear congested, and post nasal drip Rx Z-pak completed 04/14/12.  Pt is requesting an antibiotic.  Reviewed Health History In EMR: Yes  Reviewed Medications In EMR: Yes  Reviewed Allergies In EMR: Yes  Reviewed Surgeries / Procedures: Yes  Date of Onset of Symptoms: 04/09/2012  Guideline(s) Used:  Ear - Congestion  Disposition Per Guideline:   Home Care  Reason For Disposition Reached:   Ear congestion  Advice Given:  Reassurance:  Definition: Ear congestion is the medical term used to describe symptoms of a stuffy, full, or plugged sensation in the ear. People also sometimes describe crackling or popping noises in the ear. Sometimes hearing seems slightly muffled.  Eustacian tube: There is a small collapsible tube that runs between the middle ear and the nose. Normally, it permits tiny amounts of air to move in and out of the middle ear. When the tube gets blocked, air or fluid can build up behind the ear drum (tympanic membrane). This causes the symptoms of ear congestion.  Here is some care advice that should help.  Caution - Nasal Decongestants:  Do not take these medications if you have high blood pressure, heart disease, prostate problems, or an overactive thyroid.  Call Back If:   Ear pain or fever occurs  You become worse.  RN Overrode Recommendation:  Patient Requests Prescription  Pt requested antibiotic called in.

## 2012-04-19 MED ORDER — AMOXICILLIN-POT CLAVULANATE 875-125 MG PO TABS: 1.0000 | ORAL_TABLET | Freq: Two times a day (BID) | ORAL | Status: DC

## 2012-04-19 NOTE — Telephone Encounter (Signed)
Pt was calling back regarding ear congestion and antibiotic request.  Per EPIC, RN found where Dr Clent Ridges had responded to request and gave order for anitbiotic (Augmentin 875mg  BID x 10 days)  Per pt request, [facility] [address] [phone][facility] (message left on voicemail)

## 2012-04-19 NOTE — Telephone Encounter (Signed)
I sent script e-scribe and left voice message for pt 

## 2012-04-19 NOTE — Telephone Encounter (Signed)
Call in Augmentin 875 bid for 10 days  

## 2012-04-30 ENCOUNTER — Ambulatory Visit (INDEPENDENT_AMBULATORY_CARE_PROVIDER_SITE_OTHER): Payer: BC Managed Care – PPO | Admitting: Family Medicine

## 2012-04-30 ENCOUNTER — Telehealth: Payer: Self-pay | Admitting: Family Medicine

## 2012-04-30 ENCOUNTER — Encounter: Payer: Self-pay | Admitting: Family Medicine

## 2012-04-30 VITALS — BP 120/82 | HR 87 | Temp 98.5°F | Wt 224.0 lb

## 2012-04-30 DIAGNOSIS — I10 Essential (primary) hypertension: Secondary | ICD-10-CM

## 2012-04-30 DIAGNOSIS — H9313 Tinnitus, bilateral: Secondary | ICD-10-CM

## 2012-04-30 DIAGNOSIS — H9319 Tinnitus, unspecified ear: Secondary | ICD-10-CM

## 2012-04-30 DIAGNOSIS — B370 Candidal stomatitis: Secondary | ICD-10-CM

## 2012-04-30 MED ORDER — NYSTATIN 100000 UNIT/ML MT SUSP: 500000.0000 [IU] | Freq: Four times a day (QID) | OROMUCOSAL | Status: DC

## 2012-04-30 NOTE — Progress Notes (Signed)
  Subjective:    Patient ID: Megan Crane, female    DOB: 01/06/1951, 62 y.o.   MRN: [id number]  HPI Here for several days of soreness on the tongue and in the mouth. She has recently been on Augmentin for a sinus infection. She also recently started on Benicar HCT for her BP. Her BP is down nicely now. Also she still complains of ringing in the ears and she wants to see a specialist about this.    Review of Systems  Constitutional: Negative.   HENT: Positive for hearing loss, congestion, mouth sores and tinnitus. Negative for ear pain.   Eyes: Negative.   Respiratory: Negative.        Objective:   Physical Exam  Constitutional: She appears well-developed and well-nourished.  HENT:  Right Ear: External ear normal.  Left Ear: External ear normal.  Nose: Nose normal.  Mouth/Throat: No oropharyngeal exudate.  Mild erythema throughout the OP   Eyes: Conjunctivae are normal.  Lymphadenopathy:    She has no cervical adenopathy.          Assessment & Plan:  She has thrush from her recent antibiotics. Use Nystatin oral suspension. Her HTN is stable. We will refer her to ENT.

## 2012-04-30 NOTE — Telephone Encounter (Signed)
Appt made for 4/22  [facility] Triage Call Report Triage Record Num: [id number] Operator: Jeraldine Loots Patient Name: Megan Crane Call Date & Time: 04/29/2012 5:40:46PM Patient Phone: [phone] PCP: Tera Mater. Clent Ridges Patient Gender: Female PCP Fax : [phone] Patient DOB: [birth year] Practice Name: [facility] Reason for Call: Caller: Tephanie/Patient; PCP: Gershon Crane [facility]); CB#: [phone]; Call regarding sore throat, not severe but has a tacky feeling in the sides of her throat "feels like crackers back there". The corners of her mouth are sore/slightly cracked. Also has bumps on the front/sides of her tongue. Has had the sx since 4/18. She has been Amoxil po bid and "they were making me feel woozy" so she started biting into them and chewing them up with crackers. Started that on 4/15. The wooziness has stopped since this she has done this but she also decreased the dose to 1 total a day (chewing 1/2 tab bid). She has no pain to swallow, just scratchiness and pain into her ears. Has a few white patches on her tongue. Needs to be seen in 24 hours. Triaged per Mouth Lesions and Sore Throat Protocol(s) Used: Mouth Lesions Recommended Outcome per Protocol: See Provider within 24 hours Reason for Outcome: Painful pale yellow or white spot(s) on the lining of the mouth, gums, or tongue unresponsive to 72 hours of home care or that are unusually large Care Advice: ~ 04/21/

## 2012-05-03 ENCOUNTER — Ambulatory Visit: Payer: BC Managed Care – PPO | Admitting: Family Medicine

## 2012-07-22 ENCOUNTER — Ambulatory Visit (INDEPENDENT_AMBULATORY_CARE_PROVIDER_SITE_OTHER): Payer: BC Managed Care – PPO | Admitting: Family Medicine

## 2012-07-22 DIAGNOSIS — Z23 Encounter for immunization: Secondary | ICD-10-CM

## 2012-09-19 ENCOUNTER — Telehealth: Payer: Self-pay | Admitting: Family Medicine

## 2012-09-19 NOTE — Telephone Encounter (Signed)
Pt states she has crusty, sore, scab places inside rim of her nose. Pt has had before and dr fry rx'd something. Pt would like to see Dr Clent Ridges Friday for this or have a med called in. Not sure what the med was, but he did prescribe something the first time she had this.  No appts except same day. Pt states if she can get a RX this weekend, she will come in next week. [facility] [address]

## 2012-09-20 MED ORDER — MUPIROCIN 2 % EX OINT: TOPICAL_OINTMENT | CUTANEOUS | Status: DC

## 2012-09-20 NOTE — Telephone Encounter (Signed)
Call in Bactroban ointment (NOT nasal ointment) to apply in the nose tid prn, one tube with 5 rf

## 2012-09-20 NOTE — Telephone Encounter (Signed)
I sent script e-scribe and left a voice message for pt. 

## 2012-10-02 ENCOUNTER — Ambulatory Visit (INDEPENDENT_AMBULATORY_CARE_PROVIDER_SITE_OTHER): Payer: BC Managed Care – PPO | Admitting: Family

## 2012-10-02 ENCOUNTER — Encounter: Payer: Self-pay | Admitting: Family

## 2012-10-02 ENCOUNTER — Telehealth: Payer: Self-pay | Admitting: Family Medicine

## 2012-10-02 VITALS — BP 118/78 | HR 70 | Wt 221.0 lb

## 2012-10-02 DIAGNOSIS — R300 Dysuria: Secondary | ICD-10-CM

## 2012-10-02 DIAGNOSIS — L84 Corns and callosities: Secondary | ICD-10-CM

## 2012-10-02 DIAGNOSIS — N318 Other neuromuscular dysfunction of bladder: Secondary | ICD-10-CM

## 2012-10-02 DIAGNOSIS — N3281 Overactive bladder: Secondary | ICD-10-CM

## 2012-10-02 LAB — POCT URINALYSIS DIPSTICK
Protein, UA: NEGATIVE
Urobilinogen, UA: 0.2

## 2012-10-02 NOTE — Progress Notes (Signed)
Subjective:    Patient ID: Megan Crane, female    DOB: [birth year], 62 y.o.   MRN: [id number]  HPI 62 year old white female, patient of Dr. Clent Ridges is in today with complaints of urinary frequency and urgency x1 day. Reports she is unable to empty her bladder as necessary when she's at work. She often wears a pad due to  Urinary leakage. She has a history of bladder tack in the past. She takes VESIcare as needed for overactive bladder.  Patient has concerns of corns to both small toes of her feet. Describes them as tender to touch and red. Worse with shoes. She has not tried any over-the-counter therapies.   Review of Systems  Constitutional: Negative.   HENT: Negative.   Respiratory: Negative.   Cardiovascular: Negative.   Gastrointestinal: Negative.   Genitourinary: Positive for dysuria and urgency. Negative for hematuria and vaginal discharge.  Musculoskeletal: Negative.   Skin: Negative.        Corns to 5th digit of foot  Psychiatric/Behavioral: Negative.    Past Medical History  Diagnosis Date  . Post menopausal problems     sees Dr. Ilda Mori for gyn exams  . Chickenpox   . Headache(784.0)   . Hyperlipidemia   . Hypertension   . Urinary incontinence   . Pilonidal cyst   . Nephrolithiasis     History   Social History  . Marital Status: Married    Spouse Name: N/A    Number of Children: N/A  . Years of Education: N/A   Occupational History  . Not on file.   Social History Main Topics  . Smoking status: Never Smoker   . Smokeless tobacco: Never Used  . Alcohol Use: 0.5 oz/week    1 drink(s) per week  . Drug Use: No  . Sexual Activity: Not on file   Other Topics Concern  . Not on file   Social History Narrative  . No narrative on file    Past Surgical History  Procedure Laterality Date  . Eft leg vein stripping    . Baske retrieval of a ureteral stone  2002    per Dr. Wilson Singer  . Colonoscopy  2000    normal   . Pilonidal cyst excision    .  Vaginal hysterectomy  06/09/2011    Procedure: HYSTERECTOMY VAGINAL;  Surgeon: Mickel Baas, MD;  Location: [facility];  Service: Gynecology;  Laterality: N/A;  . Vaginal prolapse repair  06/09/2011    Procedure: VAGINAL VAULT SUSPENSION;  Surgeon: Martina Sinner, MD;  Location: [facility];  Service: Urology;  Laterality: N/A;  . Cystoscopy  06/09/2011    Procedure: CYSTOSCOPY;  Surgeon: Martina Sinner, MD;  Location: [facility];  Service: Urology;  Laterality: N/A;  . Rectocele repair  06/09/2011    Procedure: POSTERIOR REPAIR (RECTOCELE);  Surgeon: Martina Sinner, MD;  Location: [facility];  Service: Urology;  Laterality: N/A;  Xenform graft 6x10    Family History  Problem Relation Age of Onset  . Cancer Father     kidney, bladder   . Coronary artery disease Other   . Diabetes Other   . Hyperlipidemia Other   . Kidney disease Other   . Cancer Other     lung    Allergies  Allergen Reactions  . Levofloxacin Itching and Other (See Comments)    disoriented  . Lidocaine Other (See Comments)    Eyes crossed  . Oxycodone-Aspirin Other (See Comments)    Dizziness/couldn't  walk    Current Outpatient Prescriptions on File Prior to Visit  Medication Sig Dispense Refill  . estradiol (ESTRACE) 0.1 MG/GM vaginal cream Place 1 g vaginally 2 (two) times a week.      . fluticasone (FLONASE) 50 MCG/ACT nasal spray Place 2 sprays into the nose daily as needed.      . loratadine (CLARITIN) 10 MG tablet Take 1 tablet (10 mg total) by mouth daily.  30 tablet  11  . Multiple Vitamin (MULTIVITAMIN) tablet Take 1 tablet by mouth daily. Centrum silver chewable      . mupirocin ointment (BACTROBAN) 2 % Apply in the nose three times a day as needed  22 g  5  . nystatin (MYCOSTATIN) 100000 UNIT/ML suspension Take 5 mLs (500,000 Units total) by mouth 4 (four) times daily.  480 mL  2  . olmesartan-hydrochlorothiazide (BENICAR HCT) 20-12.5 MG per tablet Take 1 tablet by mouth daily.  30 tablet  11  .  solifenacin (VESICARE) 5 MG tablet Take 5 mg by mouth daily.       Marland Kitchen amoxicillin-clavulanate (AUGMENTIN) 875-125 MG per tablet Take 1 tablet by mouth 2 (two) times daily.  20 tablet  0  . furosemide (LASIX) 20 MG tablet Take 20 mg by mouth daily as needed.      . [DISCONTINUED] solifenacin (VESICARE) 10 MG tablet Take 1 tablet (10 mg total) by mouth daily.  14 tablet  0   No current facility-administered medications on file prior to visit.    BP 118/78  Pulse 70  Wt 221 lb (100.245 kg)  BMI 35.14 kg/m2chart    Objective:   Physical Exam  Constitutional: She is oriented to person, place, and time. She appears well-developed and well-nourished.  Neck: Normal range of motion. Neck supple.  Cardiovascular: Normal rate, regular rhythm and normal heart sounds.   Pulmonary/Chest: Effort normal and breath sounds normal.  Abdominal: Soft. Bowel sounds are normal.  Neurological: She is alert and oriented to person, place, and time.  Skin: Skin is warm and dry.  Tender, palpable, red callus formation to the 5th digits bilaterally.   Psychiatric: She has a normal mood and affect.          Assessment & Plan:  Assessment: 1. Overactive bladder 2. Urinary frequency 3. Callus foot  Plan: Urine culture sent. Continue VESIcare to take on a daily basis for urinary frequency. Over-the-counter corn removal suggested. [place] office with any questions or concerns. Followup as needed. Refer to podiatry if necessary.

## 2012-10-02 NOTE — Telephone Encounter (Signed)
Opened in error

## 2012-10-02 NOTE — Patient Instructions (Addendum)
Overactive Bladder, Adult The bladder has two functions that are totally opposite of the other. One is to relax and stretch out so it can store urine (fills like a balloon), and the other is to contract and squeeze down so that it can empty the urine that it has stored. Proper functioning of the bladder is a complex mixing of these two functions. The filling and emptying of the bladder can be influenced by:  The bladder.  The spinal cord.  The brain.  The nerves going to the bladder.  Other organs that are closely related to the bladder such as prostate in males and the vagina in females. As your bladder fills with urine, nerve signals are sent from the bladder to the brain to tell you that you may need to urinate. Normal urination requires that the bladder squeeze down with sufficient strength to empty the bladder, but this also requires that the bladder squeeze down sufficiently long to finish the job. In addition the sphincter muscles, which normally keep you from leaking urine, must also relax so that the urine can pass. Coordination between the bladder muscle squeezing down and the sphincter muscles relaxing is required to make everything happen normally. With an overactive bladder sometimes the muscles of the bladder contract unexpectedly and involuntarily and this causes an urgent need to urinate. The normal response is to try to hold urine in by contracting the sphincter muscles. Sometimes the bladder contracts so strongly that the sphincter muscles cannot stop the urine from passing out and incontinence occurs. This kind of incontinence is called urge incontinence. Having an overactive bladder can be embarrassing and awkward. It can keep you from living life the way you want to. Many people think it is just something you have to put up with as you grow older or have certain health conditions. In fact, there are treatments that can help make your life easier and more pleasant. CAUSES  Many  things can cause an overactive bladder. Possibilities include:  Urinary tract infection or infection of nearby tissues such as the prostate.  Prostate enlargement.  In women, multiple pregnancies or surgery on the uterus or urethra.  Bladder stones, inflammation or tumors.  Caffeine.  Alcohol.  Medications. For example, diuretics (drugs that help the body get rid of extra fluid) increase urine production. Some other medicines must be taken with lots of fluids.  Muscle or nerve weakness. This might be the result of a spinal cord injury, a stroke, multiple sclerosis or Parkinson's disease.  Diabetes can cause a high urine volume which fills the bladder so quickly that the normal urge to urinate is triggered very strongly. SYMPTOMS   Loss of bladder control. You feel the need to urinate and cannot make your body wait.  Sudden, strong urges to urinate.  Urinating 8 or more times a day.  Waking up to urinate two or more times a night. DIAGNOSIS  To decide if you have overactive bladder, your healthcare provider will probably:  Ask about symptoms you have noticed.  Ask about your overall health. This will include questions about any medications you are taking.  Do a physical examination. This will help determine if there are obvious blockages or other problems.  Order some tests. These might include:  A blood test to check for diabetes or other health issues that could be contributing to the problem.  Urine testing. This could measure the flow of urine and the pressure on the bladder.  A test of your neurological   system (the brain, spinal cord and nerves). This is the system that senses the need to urinate. Some of these tests are called flow tests, bladder pressure tests and electrical measurements of the sphincter muscle.  A bladder test to check whether it is emptying completely when you urinate.  Cytoscopy. This test uses a thin tube with a tiny camera on it. It offers a  look inside your urethra and bladder to see if there are problems.  Imaging tests. You might be given a contrast dye and then asked to urinate. X-rays are taken to see how your bladder is working. TREATMENT  An overactive bladder can be treated in many ways. The treatment will depend on the cause. Whether you have a mild or severe case also makes a difference. Often, treatment can be given in your healthcare provider's office or clinic. Be sure to discuss the different options with your caregiver. They include:  Behavioral treatments. These do not involve medication or surgery:  Bladder training. For this, you would follow a schedule to urinate at regular intervals. This helps you learn to control the urge to urinate. At first, you might be asked to wait a few minutes after feeling the urge. In time, you should be able to schedule bathroom visits an hour or more apart.  Kegel exercises. These exercises strengthen the pelvic floor muscles, which support the bladder. By toning these muscles, they can help control urination, even if the bladder muscles are overactive. A specialist will teach you how to do these exercises correctly. They will require daily practice.  Weight loss. If you are obese or overweight, losing weight might stop your bladder from being overactive. Talk to your healthcare provider about how many pounds you should lose. Also ask if there is a specific program or method that would work best for you.  Diet change. This might be suggested if constipation is making your overactive bladder worse. Your healthcare provider or a nutritionist can explain ways to change what you eat to ease constipation. Other people might need to take in less caffeine or alcohol. Sometimes drinking fewer fluids is needed, too.  Protection. This is not an actual treatment. But, you could wear special pads to take care of any leakage while you wait for other treatments to take effect. This will help you avoid  embarrassment.  Physical treatments.  Electrical stimulation. Electrodes will send gentle pulses to the nerves or muscles that help control the bladder. The goal is to strengthen them. Sometimes this is done with the electrodes outside of the body. Or, they might be placed inside the body (implanted). This treatment can take several months to have an effect.  Medications. These are usually used along with other treatments. Several medicines are available. Some are injected into the muscles involved in urination. Others come in pill form. Medications sometimes prescribed include:  Anticholinergics. These drugs block the signals that the nerves deliver to the bladder. This keeps it from releasing urine at the wrong time. Researchers think the drugs might help in other ways, too.  Imipramine. This is an antidepressant. But, it relaxes bladder muscles.  Botox. This is still experimental. Some people believe that injecting it into the bladder muscles will relax them so they work more normally. It has also been injected into the sphincter muscle when the sphincter muscle does not open properly. This is a temporary fix, however. Also, it might make matters worse, especially in older people.  Surgery.  A device might be implanted  to help manage your nerves. It works on the nerves that signal when you need to urinate.  Surgery is sometimes needed with electrical stimulation. If the electrodes are implanted, this is done through surgery.  Sometimes repairs need to be made through surgery. For example, the size of the bladder can be changed. This is usually done in severe cases only. HOME CARE INSTRUCTIONS   Take any medications your healthcare provider prescribed or suggested. Follow the directions carefully.  Practice any lifestyle changes that are recommended. These might include:  Drinking less fluid or drinking at different times of the day. If you need to urinate often during the night, for  example, you may need to stop drinking fluids early in the evening.  Cutting down on caffeine or alcohol. They can both make an overactive bladder worse. Caffeine is found in coffee, tea and sodas.  Doing Kegel exercises to strengthen muscles.  Losing weight, if that is recommended.  Eating a healthy and balanced diet. This will help you avoid constipation.  Keep a journal or a log. You might be asked to record how much you drink and when, and also when you feel the need to urinate.  Learn how to care for implants or other devices, such as pessaries. SEEK MEDICAL CARE IF:   Your overactive bladder gets worse.  You feel increased pain or irritation when you urinate.  You notice blood in your urine.  You have questions about any medications or devices that your healthcare provider recommended.  You notice blood, pus or swelling at the site of any test or treatment procedure.  You have an oral temperature above 102 F (38.9 C). SEEK IMMEDIATE MEDICAL CARE IF:  You have an oral temperature above 102 F (38.9 C), not controlled by medicine. Document Released: 10/22/2008 Document Revised: 03/20/2011 Document Reviewed: 10/22/2008 [facility] Patient Information 2014 [place], [place].   Ringworm, Nail A fungal infection of the nail (tinea unguium/onychomycosis) is common. It is common as the visible part of the nail is composed of dead cells which have no blood supply to help prevent infection. It occurs because fungi are everywhere and will pick any opportunity to grow on any dead material. Because nails are very slow growing they require up to 2 years of treatment with anti-fungal medications. The entire nail back to the base is infected. This includes approximately  of the nail which you cannot see. If your caregiver has prescribed a medication by mouth, take it every day and as directed. No progress will be seen for at least 6 to 9 months. Do not be disappointed! Because fungi live on  dead cells with little or no exposure to blood supply, medication delivery to the infection is slow; thus the cure is slow. It is also why you can observe no progress in the first 6 months. The nail becoming cured is the base of the nail, as it has the blood supply. Topical medication such as creams and ointments are usually not effective. Important in successful treatment of nail fungus is closely following the medication regimen that your doctor prescribes. Sometimes you and your caregiver may elect to speed up this process by surgical removal of all the nails. Even this may still require 6 to 9 months of additional oral medications. See your caregiver as directed. Remember there will be no visible improvement for at least 6 months. See your caregiver sooner if other signs of infection (redness and swelling) develop. Document Released: 12/24/1999 Document Revised: 03/20/2011 Document Reviewed:  03/03/2008 [employer] Patient Information 2014 [place], [place].

## 2012-10-03 ENCOUNTER — Encounter: Payer: Self-pay | Admitting: Family

## 2012-11-11 ENCOUNTER — Encounter: Payer: Self-pay | Admitting: Family Medicine

## 2012-11-11 ENCOUNTER — Ambulatory Visit (INDEPENDENT_AMBULATORY_CARE_PROVIDER_SITE_OTHER): Payer: BC Managed Care – PPO | Admitting: Family Medicine

## 2012-11-11 VITALS — BP 144/90 | HR 68 | Temp 98.4°F | Wt 217.0 lb

## 2012-11-11 DIAGNOSIS — Z0000 Encounter for general adult medical examination without abnormal findings: Secondary | ICD-10-CM

## 2012-11-11 DIAGNOSIS — N390 Urinary tract infection, site not specified: Secondary | ICD-10-CM

## 2012-11-11 LAB — POCT URINALYSIS DIPSTICK
Glucose, UA: NEGATIVE
Ketones, UA: NEGATIVE
Nitrite, UA: NEGATIVE
Spec Grav, UA: 1.025

## 2012-11-11 MED ORDER — SULFAMETHOXAZOLE-TRIMETHOPRIM 200-40 MG/5ML PO SUSP: 20.0000 mL | Freq: Two times a day (BID) | ORAL | Status: DC

## 2012-11-11 NOTE — Progress Notes (Signed)
  Subjective:    Patient ID: Megan Crane, female    DOB: September 26, 1950, 62 y.o.   MRN: [id number]  HPI Here for a possible UTI. For the past 5 days she has had urgency to urinate and burning. No nausea or fever. She drinks plenty of water.    Review of Systems  Constitutional: Negative.   Genitourinary: Positive for dysuria and frequency. Negative for hematuria, flank pain and pelvic pain.       Objective:   Physical Exam  Constitutional: She appears well-developed and well-nourished. No distress.  Abdominal: Soft. Bowel sounds are normal. She exhibits no distension and no mass. There is no tenderness. There is no rebound and no guarding.          Assessment & Plan:  Treat with Bactrim. Await culture results

## 2012-11-11 NOTE — Addendum Note (Signed)
Addended by: Aniceto Boss A on: 11/11/2012 12:27 PM   Modules accepted: Orders

## 2012-11-13 LAB — URINE CULTURE

## 2012-11-14 NOTE — Progress Notes (Signed)
Quick Note:  I left message with results. ______ 

## 2012-12-02 ENCOUNTER — Other Ambulatory Visit (INDEPENDENT_AMBULATORY_CARE_PROVIDER_SITE_OTHER): Payer: BC Managed Care – PPO

## 2012-12-02 DIAGNOSIS — Z0000 Encounter for general adult medical examination without abnormal findings: Secondary | ICD-10-CM

## 2012-12-02 LAB — CBC WITH DIFFERENTIAL/PLATELET
Basophils Relative: 0.6 % (ref 0.0–3.0)
Eosinophils Relative: 1.1 % (ref 0.0–5.0)
HCT: 39.1 % (ref 36.0–46.0)
Hemoglobin: 13.1 g/dL (ref 12.0–15.0)
Lymphs Abs: 2.4 10*3/uL (ref 0.7–4.0)
MCV: 90.4 fl (ref 78.0–100.0)
Monocytes Absolute: 0.5 10*3/uL (ref 0.1–1.0)
Neutro Abs: 3.5 10*3/uL (ref 1.4–7.7)
RBC: 4.33 Mil/uL (ref 3.87–5.11)
WBC: 6.6 10*3/uL (ref 4.5–10.5)

## 2012-12-02 LAB — POCT URINALYSIS DIPSTICK
Blood, UA: NEGATIVE
Glucose, UA: NEGATIVE
Nitrite, UA: NEGATIVE
Protein, UA: NEGATIVE
Urobilinogen, UA: 0.2
pH, UA: 6

## 2012-12-02 LAB — BASIC METABOLIC PANEL
CO2: 28 mEq/L (ref 19–32)
Calcium: 9.6 mg/dL (ref 8.4–10.5)
Chloride: 100 mEq/L (ref 96–112)
Creatinine, Ser: 0.7 mg/dL (ref 0.4–1.2)
Glucose, Bld: 81 mg/dL (ref 70–99)
Sodium: 136 mEq/L (ref 135–145)

## 2012-12-02 LAB — HEPATIC FUNCTION PANEL
ALT: 22 U/L (ref 0–35)
AST: 22 U/L (ref 0–37)
Albumin: 4.1 g/dL (ref 3.5–5.2)
Alkaline Phosphatase: 56 U/L (ref 39–117)
Total Bilirubin: 0.6 mg/dL (ref 0.3–1.2)
Total Protein: 7.4 g/dL (ref 6.0–8.3)

## 2012-12-02 LAB — LIPID PANEL
Cholesterol: 212 mg/dL — ABNORMAL HIGH (ref 0–200)
Triglycerides: 82 mg/dL (ref 0.0–149.0)
VLDL: 16.4 mg/dL (ref 0.0–40.0)

## 2012-12-11 ENCOUNTER — Encounter: Payer: Self-pay | Admitting: Family Medicine

## 2012-12-11 ENCOUNTER — Ambulatory Visit (INDEPENDENT_AMBULATORY_CARE_PROVIDER_SITE_OTHER): Payer: BC Managed Care – PPO | Admitting: Family Medicine

## 2012-12-11 VITALS — BP 140/86 | HR 80 | Temp 98.0°F | Ht 65.5 in | Wt 216.0 lb

## 2012-12-11 DIAGNOSIS — L989 Disorder of the skin and subcutaneous tissue, unspecified: Secondary | ICD-10-CM

## 2012-12-11 DIAGNOSIS — Z23 Encounter for immunization: Secondary | ICD-10-CM

## 2012-12-11 DIAGNOSIS — Z0000 Encounter for general adult medical examination without abnormal findings: Secondary | ICD-10-CM

## 2012-12-11 DIAGNOSIS — I83893 Varicose veins of bilateral lower extremities with other complications: Secondary | ICD-10-CM

## 2012-12-11 MED ORDER — TERBINAFINE HCL 250 MG PO TABS: 250.0000 mg | ORAL_TABLET | Freq: Every day | ORAL | Status: DC

## 2012-12-11 NOTE — Progress Notes (Signed)
   Subjective:    Patient ID: Megan Crane, female    DOB: May 05, 1950, 62 y.o.   MRN: [id number]  HPI 62 yr old female for a cpx. She has several issues to discuss. First she has had toenail fungus for 2 years and wants to treat it. Also she is past due for a colonoscopy. She asks me to look at a lesion on the side of her nose that appeared a year ago. Finally she has had varicose veins in the legs for years and now this has become painful. She asks for a rx for compression hose.    Review of Systems  Constitutional: Negative.   HENT: Negative.   Eyes: Negative.   Respiratory: Negative.   Cardiovascular: Positive for leg swelling. Negative for chest pain and palpitations.  Gastrointestinal: Negative.   Genitourinary: Negative for dysuria, urgency, frequency, hematuria, flank pain, decreased urine volume, enuresis, difficulty urinating, pelvic pain and dyspareunia.  Musculoskeletal: Negative.   Skin: Negative.   Neurological: Negative.   Psychiatric/Behavioral: Negative.        Objective:   Physical Exam  Constitutional: She is oriented to person, place, and time. She appears well-developed and well-nourished. No distress.  HENT:  Head: Normocephalic and atraumatic.  Right Ear: External ear normal.  Left Ear: External ear normal.  Nose: Nose normal.  Mouth/Throat: Oropharynx is clear and moist. No oropharyngeal exudate.  Eyes: Conjunctivae and EOM are normal. Pupils are equal, round, and reactive to light. No scleral icterus.  Neck: Normal range of motion. Neck supple. No JVD present. No thyromegaly present.  Cardiovascular: Normal rate, regular rhythm, normal heart sounds and intact distal pulses.  Exam reveals no gallop and no friction rub.   No murmur heard. EKG normal , she has tender varicose veins in both lower legs   Pulmonary/Chest: Effort normal and breath sounds normal. No respiratory distress. She has no wheezes. She has no rales. She exhibits no tenderness.    Abdominal: Soft. Bowel sounds are normal. She exhibits no distension and no mass. There is no tenderness. There is no rebound and no guarding.  Musculoskeletal: Normal range of motion. She exhibits no edema and no tenderness.  Lymphadenopathy:    She has no cervical adenopathy.  Neurological: She is alert and oriented to person, place, and time. She has normal reflexes. No cranial nerve deficit. She exhibits normal muscle tone. Coordination normal.  Skin: Skin is warm and dry. No rash noted. No erythema.  There is a scaly plaque-like lesion on the right side of the nose . All ten toenails are thick and yellow  Psychiatric: She has a normal mood and affect. Her behavior is normal. Judgment and thought content normal.          Assessment & Plan:  Well exam. Refer for colonoscopy. Refer to Dr. Shon Hough for a possible basal cell cancer on the nose. Treat the toenails with Terbinafine. Wear compression pantyhose.

## 2012-12-11 NOTE — Addendum Note (Signed)
Addended by: Aniceto Boss A on: 12/11/2012 02:55 PM   Modules accepted: Orders

## 2012-12-11 NOTE — Progress Notes (Signed)
Pre visit review using our clinic review tool, if applicable. No additional management support is needed unless otherwise documented below in the visit note. 

## 2012-12-30 ENCOUNTER — Encounter: Payer: Self-pay | Admitting: Family Medicine

## 2012-12-30 ENCOUNTER — Ambulatory Visit (INDEPENDENT_AMBULATORY_CARE_PROVIDER_SITE_OTHER): Payer: BC Managed Care – PPO | Admitting: Family Medicine

## 2012-12-30 VITALS — BP 120/76 | HR 78 | Temp 98.6°F | Wt 213.0 lb

## 2012-12-30 DIAGNOSIS — J069 Acute upper respiratory infection, unspecified: Secondary | ICD-10-CM

## 2012-12-30 NOTE — Progress Notes (Signed)
Pre visit review using our clinic review tool, if applicable. No additional management support is needed unless otherwise documented below in the visit note. 

## 2012-12-30 NOTE — Progress Notes (Signed)
Chief Complaint  Patient presents with  . Cough    stuffy nose, sneezing, scratchy throat, achy body x Friday     HPI:  -started: 4 days ago -symptoms:nasal congestion, sore throat, cough, sneezing -denies:fever, SOB, NVD, tooth pain -has tried: nothing -sick contacts/travel/risks: denies flu exposure or Ebola risks -Hx of: allergies  ROS: See pertinent positives and negatives per HPI.  Past Medical History  Diagnosis Date  . Post menopausal problems     sees Dr. Ilda Mori for gyn exams  . Chickenpox   . Headache(784.0)   . Hyperlipidemia   . Hypertension   . Urinary incontinence   . Pilonidal cyst   . Nephrolithiasis     Past Surgical History  Procedure Laterality Date  . Eft leg vein stripping    . Baske retrieval of a ureteral stone  2002    per Dr. Wilson Singer  . Colonoscopy  2000    normal   . Pilonidal cyst excision    . Vaginal hysterectomy  06/09/2011    Procedure: HYSTERECTOMY VAGINAL;  Surgeon: Mickel Baas, MD;  Location: [facility];  Service: Gynecology;  Laterality: N/A;  . Vaginal prolapse repair  06/09/2011    Procedure: VAGINAL VAULT SUSPENSION;  Surgeon: Martina Sinner, MD;  Location: [facility];  Service: Urology;  Laterality: N/A;  . Cystoscopy  06/09/2011    Procedure: CYSTOSCOPY;  Surgeon: Martina Sinner, MD;  Location: [facility];  Service: Urology;  Laterality: N/A;  . Rectocele repair  06/09/2011    Procedure: POSTERIOR REPAIR (RECTOCELE);  Surgeon: Martina Sinner, MD;  Location: [facility];  Service: Urology;  Laterality: N/A;  Xenform graft 6x10    Family History  Problem Relation Age of Onset  . Cancer Father     kidney, bladder   . Coronary artery disease Other   . Diabetes Other   . Hyperlipidemia Other   . Kidney disease Other   . Cancer Other     lung    History   Social History  . Marital Status: Married    Spouse Name: N/A    Number of Children: N/A  . Years of Education: N/A   Social History Main Topics  . Smoking  status: Never Smoker   . Smokeless tobacco: Never Used  . Alcohol Use: 0.5 oz/week    1 drink(s) per week  . Drug Use: No  . Sexual Activity: None   Other Topics Concern  . None   Social History Narrative  . None    Current outpatient prescriptions:estradiol (ESTRACE) 0.1 MG/GM vaginal cream, Place 1 g vaginally 2 (two) times a week., Disp: , Rfl: ;  fluticasone (FLONASE) 50 MCG/ACT nasal spray, Place 2 sprays into the nose daily as needed., Disp: , Rfl: ;  furosemide (LASIX) 20 MG tablet, Take 20 mg by mouth daily as needed., Disp: , Rfl: ;  loratadine (CLARITIN) 10 MG tablet, Take 1 tablet (10 mg total) by mouth daily., Disp: 30 tablet, Rfl: 11 Multiple Vitamin (MULTIVITAMIN) tablet, Take 1 tablet by mouth daily. Centrum silver chewable, Disp: , Rfl: ;  mupirocin ointment (BACTROBAN) 2 %, Apply in the nose three times a day as needed, Disp: 22 g, Rfl: 5;  nystatin (MYCOSTATIN) 100000 UNIT/ML suspension, Take 5 mLs (500,000 Units total) by mouth 4 (four) times daily., Disp: 480 mL, Rfl: 2 olmesartan-hydrochlorothiazide (BENICAR HCT) 20-12.5 MG per tablet, Take 1 tablet by mouth daily., Disp: 30 tablet, Rfl: 11;  solifenacin (VESICARE) 5 MG tablet, Take 5  mg by mouth daily. , Disp: , Rfl: ;  terbinafine (LAMISIL) 250 MG tablet, Take 1 tablet (250 mg total) by mouth daily., Disp: 90 tablet, Rfl: 1;  [DISCONTINUED] solifenacin (VESICARE) 10 MG tablet, Take 1 tablet (10 mg total) by mouth daily., Disp: 14 tablet, Rfl: 0  EXAM:  Filed Vitals:   12/30/12 1544  BP: 120/76  Pulse: 78  Temp: 98.6 F (37 C)    Body mass index is 34.89 kg/(m^2).  GENERAL: vitals reviewed and listed above, alert, oriented, appears well hydrated and in no acute distress  HEENT: atraumatic, conjunttiva clear, no obvious abnormalities on inspection of external nose and ears, normal appearance of ear canals and TMs, clear nasal congestion, mild post oropharyngeal erythema with PND, no tonsillar edema or exudate, no  sinus TTP  NECK: no obvious masses on inspection  LUNGS: clear to auscultation bilaterally, no wheezes, rales or rhonchi, good air movement  CV: HRRR, no peripheral edema  MS: moves all extremities without noticeable abnormality  PSYCH: pleasant and cooperative, no obvious depression or anxiety  ASSESSMENT AND PLAN:  Discussed the following assessment and plan:  Upper respiratory infection  -given HPI and exam findings today, a serious infection or illness is unlikely. We discussed potential etiologies, with VURI being most likely, and advised supportive care and monitoring. We discussed treatment side effects, likely course, antibiotic misuse, transmission, and signs of developing a serious illness. -of course, we advised to return or notify a doctor immediately if symptoms worsen or persist or new concerns arise.  Patient Instructions  INSTRUCTIONS FOR UPPER RESPIRATORY INFECTION:  -plenty of rest and fluids  -nasal saline wash 2-3 times daily (use prepackaged nasal saline or bottled/distilled water if making your own)   -can use sinex and afrin nasal spray for drainage and nasal congestion - but do NOT use longer then 3-4 days  -can use tylenol or ibuprofen as directed for aches and sorethroat  -in the winter time, using a humidifier at night is helpful (please follow cleaning instructions)  -if you are taking a cough medication - use only as directed, may also try a teaspoon of honey to coat the throat and throat lozenges  -for sore throat, salt water gargles can help  -follow up if you have fevers, facial pain, tooth pain, difficulty breathing or are worsening or not getting better in 5-7 days      KIM, HANNAH R.

## 2012-12-30 NOTE — Patient Instructions (Signed)
INSTRUCTIONS FOR UPPER RESPIRATORY INFECTION:  -plenty of rest and fluids  -nasal saline wash 2-3 times daily (use prepackaged nasal saline or bottled/distilled water if making your own)   -can use sinex and afrin nasal spray for drainage and nasal congestion - but do NOT use longer then 3-4 days  -can use tylenol or ibuprofen as directed for aches and sorethroat  -in the winter time, using a humidifier at night is helpful (please follow cleaning instructions)  -if you are taking a cough medication - use only as directed, may also try a teaspoon of honey to coat the throat and throat lozenges  -for sore throat, salt water gargles can help  -follow up if you have fevers, facial pain, tooth pain, difficulty breathing or are worsening or not getting better in 5-7 days  

## 2013-01-06 ENCOUNTER — Telehealth: Payer: Self-pay

## 2013-01-06 NOTE — Telephone Encounter (Signed)
Pt c/o possible UTI and pressure with urination. Feels like bladder is not empty and rt side flank pain. Pt is requesting to drop off urine sample for evaluation.

## 2013-01-06 NOTE — Telephone Encounter (Signed)
Pt is going to see Dr Selena Batten tomorrow at 2:15

## 2013-01-07 ENCOUNTER — Ambulatory Visit (INDEPENDENT_AMBULATORY_CARE_PROVIDER_SITE_OTHER): Payer: BC Managed Care – PPO | Admitting: Family Medicine

## 2013-01-07 ENCOUNTER — Encounter: Payer: Self-pay | Admitting: Family Medicine

## 2013-01-07 VITALS — BP 132/84 | Temp 98.6°F | Wt 216.0 lb

## 2013-01-07 DIAGNOSIS — R300 Dysuria: Secondary | ICD-10-CM

## 2013-01-07 LAB — POCT URINALYSIS DIPSTICK
Bilirubin, UA: NEGATIVE
Blood, UA: NEGATIVE
Ketones, UA: NEGATIVE
Protein, UA: NEGATIVE
Spec Grav, UA: 1.02
pH, UA: 5.5

## 2013-01-07 MED ORDER — SULFAMETHOXAZOLE-TMP DS 800-160 MG PO TABS: 1.0000 | ORAL_TABLET | Freq: Two times a day (BID) | ORAL | Status: DC

## 2013-01-07 NOTE — Progress Notes (Signed)
No chief complaint on file.   HPI:  Acute visit for dysuria: -started 3 days ago -symptoms: dysuria, frequency urgency, a little low back pain - now resolved -denies: flabk pain, abd pain, fevers, NVD, hematuria -hx hysterectomy and occ UTIs,  followed by urology, occ urgency  ROS: See pertinent positives and negatives per HPI.  Past Medical History  Diagnosis Date  . Post menopausal problems     sees Dr. Ilda Mori for gyn exams  . Chickenpox   . Headache(784.0)   . Hyperlipidemia   . Hypertension   . Urinary incontinence   . Pilonidal cyst   . Nephrolithiasis     Past Surgical History  Procedure Laterality Date  . Eft leg vein stripping    . Baske retrieval of a ureteral stone  2002    per Dr. Wilson Singer  . Colonoscopy  2000    normal   . Pilonidal cyst excision    . Vaginal hysterectomy  06/09/2011    Procedure: HYSTERECTOMY VAGINAL;  Surgeon: Mickel Baas, MD;  Location: [facility];  Service: Gynecology;  Laterality: N/A;  . Vaginal prolapse repair  06/09/2011    Procedure: VAGINAL VAULT SUSPENSION;  Surgeon: Martina Sinner, MD;  Location: [facility];  Service: Urology;  Laterality: N/A;  . Cystoscopy  06/09/2011    Procedure: CYSTOSCOPY;  Surgeon: Martina Sinner, MD;  Location: [facility];  Service: Urology;  Laterality: N/A;  . Rectocele repair  06/09/2011    Procedure: POSTERIOR REPAIR (RECTOCELE);  Surgeon: Martina Sinner, MD;  Location: [facility];  Service: Urology;  Laterality: N/A;  Xenform graft 6x10    Family History  Problem Relation Age of Onset  . Cancer Father     kidney, bladder   . Coronary artery disease Other   . Diabetes Other   . Hyperlipidemia Other   . Kidney disease Other   . Cancer Other     lung    History   Social History  . Marital Status: Married    Spouse Name: N/A    Number of Children: N/A  . Years of Education: N/A   Social History Main Topics  . Smoking status: Never Smoker   . Smokeless tobacco: Never Used  . Alcohol  Use: 0.5 oz/week    1 drink(s) per week  . Drug Use: No  . Sexual Activity: None   Other Topics Concern  . None   Social History Narrative  . None    Current outpatient prescriptions:fluticasone (FLONASE) 50 MCG/ACT nasal spray, Place 2 sprays into the nose daily as needed., Disp: , Rfl: ;  Multiple Vitamin (MULTIVITAMIN) tablet, Take 1 tablet by mouth daily. Centrum silver chewable, Disp: , Rfl: ;  olmesartan-hydrochlorothiazide (BENICAR HCT) 20-12.5 MG per tablet, Take 1 tablet by mouth daily., Disp: 30 tablet, Rfl: 11 solifenacin (VESICARE) 5 MG tablet, Take 5 mg by mouth daily. , Disp: , Rfl: ;  estradiol (ESTRACE) 0.1 MG/GM vaginal cream, Place 1 g vaginally 2 (two) times a week., Disp: , Rfl: ;  furosemide (LASIX) 20 MG tablet, Take 20 mg by mouth daily as needed., Disp: , Rfl: ;  loratadine (CLARITIN) 10 MG tablet, Take 1 tablet (10 mg total) by mouth daily., Disp: 30 tablet, Rfl: 11 mupirocin ointment (BACTROBAN) 2 %, Apply in the nose three times a day as needed, Disp: 22 g, Rfl: 5;  nystatin (MYCOSTATIN) 100000 UNIT/ML suspension, Take 5 mLs (500,000 Units total) by mouth 4 (four) times daily., Disp: 480 mL, Rfl:  2;  sulfamethoxazole-trimethoprim (BACTRIM DS) 800-160 MG per tablet, Take 1 tablet by mouth 2 (two) times daily., Disp: 6 tablet, Rfl: 0 terbinafine (LAMISIL) 250 MG tablet, Take 1 tablet (250 mg total) by mouth daily., Disp: 90 tablet, Rfl: 1;  [DISCONTINUED] solifenacin (VESICARE) 10 MG tablet, Take 1 tablet (10 mg total) by mouth daily., Disp: 14 tablet, Rfl: 0  EXAM:  Filed Vitals:   01/07/13 1448  BP: 132/84  Temp: 98.6 F (37 C)    Body mass index is 35.38 kg/(m^2).  GENERAL: vitals reviewed and listed above, alert, oriented, appears well hydrated and in no acute distress  HEENT: atraumatic, conjunttiva clear, no obvious abnormalities on inspection of external nose and ears  NECK: no obvious masses on inspection  LUNGS: clear to auscultation bilaterally, no  wheezes, rales or rhonchi, good air movement  CV: HRRR, no peripheral edema  ABD: soft, NTTP, no CVA TTP  MS: moves all extremities without noticeable abnormality  PSYCH: pleasant and cooperative, no obvious depression or anxiety  ASSESSMENT AND PLAN:  Discussed the following assessment and plan:  Dysuria - Plan: POCT urinalysis dipstick, Urine culture, sulfamethoxazole-trimethoprim (BACTRIM DS) 800-160 MG per tablet  -Patient advised to return or notify a doctor immediately if symptoms worsen or persist or new concerns arise.  Patient Instructions  -As we discussed, we have prescribed a new medication for you at this appointment. We discussed the common and serious potential adverse effects of this medication and you can review these and more with the pharmacist when you pick up your medication.  Please follow the instructions for use carefully and notify us immediately if you have any problems taking this medication.   -follow up with your urologist if persistent symptoms      KIM, HANNAH R.

## 2013-01-07 NOTE — Patient Instructions (Signed)
-  As we discussed, we have prescribed a new medication for you at this appointment. We discussed the common and serious potential adverse effects of this medication and you can review these and more with the pharmacist when you pick up your medication.  Please follow the instructions for use carefully and notify us immediately if you have any problems taking this medication.   -follow up with your urologist if persistent symptoms

## 2013-01-09 LAB — URINE CULTURE
Colony Count: NO GROWTH
Organism ID, Bacteria: NO GROWTH

## 2013-01-27 ENCOUNTER — Telehealth: Payer: Self-pay | Admitting: Family Medicine

## 2013-01-28 NOTE — Telephone Encounter (Signed)
[facility] Triage Call Report Triage Record Num: [id number] Operator: Feliberto Harts Patient Name: Megan Crane Call Date & Time: 01/27/2013 5:05:21PM Patient Phone: [phone] PCP: Ishmael Holter. Sarajane Jews Patient Gender: Female PCP Fax : [phone] Patient DOB: 09-15-1950 Practice Name: [facility] Reason for Call: Caller: [name]/Patient; PCP: [name]); CB#: [phone]; Call regarding Medication Issue; Medication(s): TERBINANSINE HCL 250 TABS; 01/27/2013 Pt. states Dr.Fry placed pt. on Terbinafine HCL. Pt. to take 250 mg one tab QD X 6 months. Pt. was placed on medication at the end of Nov. for toenail fungus but is starting it today. Wanted to know side effects and if compatible with Benicair and Vesicare. Looked in PDR online and provided information for pt. No triage needed. Note to office. Protocol(s) Used: Office Note Recommended Outcome per Protocol: Information Noted and Sent to Office Reason for Outcome: Caller information to office

## 2013-02-05 ENCOUNTER — Ambulatory Visit (INDEPENDENT_AMBULATORY_CARE_PROVIDER_SITE_OTHER): Payer: BC Managed Care – PPO | Admitting: Family Medicine

## 2013-02-05 ENCOUNTER — Encounter: Payer: Self-pay | Admitting: Family Medicine

## 2013-02-05 VITALS — BP 128/80 | HR 90 | Temp 98.5°F | Ht 65.5 in | Wt 216.0 lb

## 2013-02-05 DIAGNOSIS — J019 Acute sinusitis, unspecified: Secondary | ICD-10-CM

## 2013-02-05 MED ORDER — AZITHROMYCIN 250 MG PO TABS: ORAL_TABLET | ORAL | Status: DC

## 2013-02-05 NOTE — Progress Notes (Signed)
Pre visit review using our clinic review tool, if applicable. No additional management support is needed unless otherwise documented below in the visit note. 

## 2013-02-05 NOTE — Progress Notes (Signed)
   Subjective:    Patient ID: Megan Crane, female    DOB: [birth year], 63 y.o.   MRN: [id number]  HPI Here for 4 days of pressure and pain in both ears, also sinus pressure and PND. No cough or fever.    Review of Systems  Constitutional: Negative.   HENT: Positive for congestion, ear pain, postnasal drip and sinus pressure.   Eyes: Negative.   Respiratory: Negative.        Objective:   Physical Exam  HENT:  Right Ear: External ear normal.  Left Ear: External ear normal.  Nose: Nose normal.  Mouth/Throat: Oropharynx is clear and moist.  Eyes: Conjunctivae are normal.  Pulmonary/Chest: Effort normal and breath sounds normal.  Lymphadenopathy:    She has no cervical adenopathy.          Assessment & Plan:  Add Mucinex D.

## 2013-04-17 ENCOUNTER — Telehealth: Payer: Self-pay | Admitting: Family Medicine

## 2013-04-17 NOTE — Telephone Encounter (Signed)
[id number] - [place], [place] - [address] is requesting re-fill on olmesartan-hydrochlorothiazide (BENICAR HCT) 20-12.5 MG per tablet. Request states pt has an appt next Wednesday and she is out of medication.

## 2013-04-18 MED ORDER — OLMESARTAN MEDOXOMIL-HCTZ 20-12.5 MG PO TABS: 1.0000 | ORAL_TABLET | Freq: Every day | ORAL | Status: DC

## 2013-04-18 NOTE — Telephone Encounter (Signed)
I sent script e-scribe and I left a voice message.

## 2013-04-23 ENCOUNTER — Ambulatory Visit (INDEPENDENT_AMBULATORY_CARE_PROVIDER_SITE_OTHER): Payer: BC Managed Care – PPO | Admitting: Family Medicine

## 2013-04-23 ENCOUNTER — Encounter: Payer: Self-pay | Admitting: Family Medicine

## 2013-04-23 VITALS — BP 130/86 | HR 73 | Temp 99.3°F | Ht 65.5 in | Wt 220.0 lb

## 2013-04-23 DIAGNOSIS — R350 Frequency of micturition: Secondary | ICD-10-CM

## 2013-04-23 DIAGNOSIS — L509 Urticaria, unspecified: Secondary | ICD-10-CM

## 2013-04-23 LAB — POCT URINALYSIS DIPSTICK
Bilirubin, UA: NEGATIVE
Glucose, UA: NEGATIVE
Ketones, UA: NEGATIVE
Leukocytes, UA: NEGATIVE
Nitrite, UA: NEGATIVE
RBC UA: NEGATIVE
SPEC GRAV UA: 1.015
Urobilinogen, UA: 0.2
pH, UA: 6

## 2013-04-23 MED ORDER — PREDNISONE 10 MG PO TABS: ORAL_TABLET | ORAL | Status: DC

## 2013-04-23 NOTE — Progress Notes (Signed)
   Subjective:    Patient ID: Megan Crane, female    DOB: 1950/06/10, 63 y.o.   MRN: [id number]  HPI Here for an itchy rash that started 3 weeks ago. It began on the trunk and has spread to the arms and legs. She has taken Claritin and used Triamcinolone cream.    Review of Systems  Constitutional: Negative.   Skin: Positive for rash.       Objective:   Physical Exam  Constitutional: She appears well-developed and well-nourished.  Skin:  Widespread red maculopapular rash as above           Assessment & Plan:  Given a 16 day taper of prednisone. Recheck prn

## 2013-04-23 NOTE — Addendum Note (Signed)
Addended by: Aggie Hacker A on: 04/23/2013 02:42 PM   Modules accepted: Orders

## 2013-04-23 NOTE — Progress Notes (Signed)
Pre visit review using our clinic review tool, if applicable. No additional management support is needed unless otherwise documented below in the visit note. 

## 2013-05-07 ENCOUNTER — Telehealth: Payer: Self-pay | Admitting: Family Medicine

## 2013-05-07 NOTE — Telephone Encounter (Signed)
Per Dr. Sarajane Jews okay to refill Prenisone.

## 2013-05-07 NOTE — Telephone Encounter (Signed)
Pt said she need a rx for 3 months on olmesartan-hydrochlorothiazide (BENICAR HCT) 20-12.5 MG per tablet  She said if he can rx  predniSONE (DELTASONE) 10 MG tablet 1 more time

## 2013-05-08 MED ORDER — PREDNISONE 10 MG PO TABS: ORAL_TABLET | ORAL | Status: DC

## 2013-05-08 MED ORDER — OLMESARTAN MEDOXOMIL-HCTZ 20-12.5 MG PO TABS: 1.0000 | ORAL_TABLET | Freq: Every day | ORAL | Status: DC

## 2013-05-08 NOTE — Telephone Encounter (Signed)
Script for Prednisone was printed and faxed, Benicar was sent e-scribe.

## 2013-05-15 ENCOUNTER — Encounter: Payer: Self-pay | Admitting: Family Medicine

## 2013-05-15 ENCOUNTER — Ambulatory Visit (INDEPENDENT_AMBULATORY_CARE_PROVIDER_SITE_OTHER): Payer: BC Managed Care – PPO | Admitting: Family Medicine

## 2013-05-15 VITALS — BP 124/77 | HR 79 | Temp 98.7°F | Ht 65.5 in | Wt 218.0 lb

## 2013-05-15 DIAGNOSIS — J019 Acute sinusitis, unspecified: Secondary | ICD-10-CM

## 2013-05-15 MED ORDER — AZITHROMYCIN 250 MG PO TABS: ORAL_TABLET | ORAL | Status: DC

## 2013-05-15 MED ORDER — FUROSEMIDE 20 MG PO TABS: 20.0000 mg | ORAL_TABLET | Freq: Every day | ORAL | Status: DC | PRN

## 2013-05-15 NOTE — Progress Notes (Signed)
   Subjective:    Patient ID: Megan Crane, female    DOB: October 07, 1950, 63 y.o.   MRN: [id number]  HPI Here for 5 days of sinus preesure, PND and a dry cough. No fever.    Review of Systems  Constitutional: Negative.   HENT: Positive for congestion, postnasal drip and sinus pressure.   Eyes: Negative.   Respiratory: Positive for cough.        Objective:   Physical Exam  Constitutional: She appears well-developed and well-nourished.  HENT:  Right Ear: External ear normal.  Left Ear: External ear normal.  Nose: Nose normal.  Mouth/Throat: Oropharynx is clear and moist.  Eyes: Conjunctivae are normal.  Pulmonary/Chest: Effort normal and breath sounds normal. No respiratory distress. She has no wheezes. She has no rales.  Lymphadenopathy:    She has no cervical adenopathy.          Assessment & Plan:  Add Mucinex

## 2013-05-15 NOTE — Progress Notes (Signed)
Pre visit review using our clinic review tool, if applicable. No additional management support is needed unless otherwise documented below in the visit note. 

## 2013-08-11 ENCOUNTER — Telehealth: Payer: Self-pay | Admitting: Family Medicine

## 2013-08-11 ENCOUNTER — Other Ambulatory Visit: Payer: Self-pay | Admitting: Family Medicine

## 2013-08-11 DIAGNOSIS — R350 Frequency of micturition: Secondary | ICD-10-CM

## 2013-08-11 NOTE — Telephone Encounter (Signed)
appt scheduled and pt says THANK YOU!!

## 2013-08-11 NOTE — Telephone Encounter (Signed)
Pt thinks she has a uti. Her husband has changed jobs and insurance will got into effect on 8/18. Pt would like to know if she could come in for urine test only and we would bill her for that test. Pt cannot afford OV. pls advise.  When insurance goes into effect, pt will make appt.

## 2013-08-11 NOTE — Telephone Encounter (Signed)
Per Dr. Sarajane Jews, okay to order in computer and I did put future order in. Can you call pt to schedule the lab appointment?

## 2013-08-13 ENCOUNTER — Other Ambulatory Visit: Payer: Self-pay | Admitting: Family Medicine

## 2013-08-13 ENCOUNTER — Other Ambulatory Visit (INDEPENDENT_AMBULATORY_CARE_PROVIDER_SITE_OTHER): Payer: Managed Care, Other (non HMO)

## 2013-08-13 DIAGNOSIS — R350 Frequency of micturition: Secondary | ICD-10-CM

## 2013-08-13 LAB — POCT URINALYSIS DIPSTICK
BILIRUBIN UA: NEGATIVE
Blood, UA: NEGATIVE
Glucose, UA: NEGATIVE
Ketones, UA: NEGATIVE
NITRITE UA: NEGATIVE
PH UA: 5.5
Protein, UA: NEGATIVE
Spec Grav, UA: <=1.005
Urobilinogen, UA: 0.2

## 2013-08-15 MED ORDER — SULFAMETHOXAZOLE-TMP DS 800-160 MG PO TABS: 1.0000 | ORAL_TABLET | Freq: Two times a day (BID) | ORAL | Status: DC

## 2013-08-15 NOTE — Addendum Note (Signed)
Addended by: Aggie Hacker A on: 08/15/2013 11:35 AM   Modules accepted: Orders

## 2013-09-18 ENCOUNTER — Telehealth: Payer: Self-pay | Admitting: Family Medicine

## 2013-09-18 NOTE — Telephone Encounter (Signed)
I left a voice message with below information. 

## 2013-09-18 NOTE — Telephone Encounter (Signed)
She just had a TDaP in July 2014. Sorry but I am too full to see her friend.

## 2013-09-18 NOTE — Telephone Encounter (Signed)
Sylvia:  Pt needs tdap for school. She is returning to college.  Pt wants to know if she is current on her tdap injection.does pt need ?  Dr Sarajane Jews: Pt has a best friend she would like to know if dr fry will accept her as a pt? If so, she would like to get her in asap, like next week. Pt having thyroid issues.

## 2013-09-18 NOTE — Telephone Encounter (Signed)
Pt states she will cb this afternoon. Will wait for he call,

## 2013-09-19 ENCOUNTER — Telehealth: Payer: Self-pay | Admitting: Family Medicine

## 2013-09-19 ENCOUNTER — Ambulatory Visit (INDEPENDENT_AMBULATORY_CARE_PROVIDER_SITE_OTHER): Payer: Managed Care, Other (non HMO) | Admitting: Family Medicine

## 2013-09-19 ENCOUNTER — Ambulatory Visit: Payer: Self-pay | Admitting: Family Medicine

## 2013-09-19 ENCOUNTER — Encounter: Payer: Self-pay | Admitting: Family Medicine

## 2013-09-19 VITALS — BP 113/69 | HR 71 | Temp 99.2°F | Ht 65.5 in | Wt 217.0 lb

## 2013-09-19 DIAGNOSIS — R350 Frequency of micturition: Secondary | ICD-10-CM

## 2013-09-19 DIAGNOSIS — M25569 Pain in unspecified knee: Secondary | ICD-10-CM

## 2013-09-19 DIAGNOSIS — N390 Urinary tract infection, site not specified: Secondary | ICD-10-CM

## 2013-09-19 DIAGNOSIS — M25561 Pain in right knee: Principal | ICD-10-CM

## 2013-09-19 DIAGNOSIS — G8929 Other chronic pain: Secondary | ICD-10-CM

## 2013-09-19 DIAGNOSIS — Z23 Encounter for immunization: Secondary | ICD-10-CM

## 2013-09-19 DIAGNOSIS — R300 Dysuria: Secondary | ICD-10-CM

## 2013-09-19 LAB — POCT URINALYSIS DIPSTICK
Bilirubin, UA: NEGATIVE
GLUCOSE UA: NEGATIVE
Ketones, UA: NEGATIVE
NITRITE UA: NEGATIVE
PH UA: 6
RBC UA: NEGATIVE
Spec Grav, UA: 1.025
Urobilinogen, UA: 0.2

## 2013-09-19 MED ORDER — SULFAMETHOXAZOLE-TMP DS 800-160 MG PO TABS: 1.0000 | ORAL_TABLET | Freq: Two times a day (BID) | ORAL | Status: DC

## 2013-09-19 NOTE — Progress Notes (Signed)
   Subjective:    Patient ID: Megan Crane, female    DOB: 1950/04/26, 63 y.o.   MRN: [id number]  HPI Here to check her right knee. Over the past year she has had several instances where she felt like the knee gave way or "popped out of joint" and then went right back into place. No hx of trauma. There was no pain when these instances occurred. Also she describes a dull pain on the sides of her knee which is worse when she walks a lot or goes up steps. This is not related to the giving way episodes.    Review of Systems  Constitutional: Negative.   Musculoskeletal: Positive for arthralgias and gait problem. Negative for joint swelling.       Objective:   Physical Exam  Constitutional: She appears well-developed and well-nourished.  Musculoskeletal:  The right knee is not swollen. ROM is full but she has pain on full flexion. There is some crepitus present. No laxity of the patella. Anterior drawer is negative.           Assessment & Plan:  She has some early osteoarthritis in the knee and it sounds like she has an occasional subluxation of the patella. I advised her to lose weight and to wear a Neoprene support sleeve. Use Advil prn. Doing knee extensions to strengthen her quadriceps will also help.

## 2013-09-19 NOTE — Addendum Note (Signed)
Addended by: Aggie Hacker A on: 09/19/2013 04:11 PM   Modules accepted: Orders

## 2013-09-19 NOTE — Telephone Encounter (Signed)
I spoke with pt to let her know that the script was sent to pharmacy and a urine culture was ordered.

## 2013-09-19 NOTE — Telephone Encounter (Signed)
Culture ordered and I sent in a rx for Bactrim DS

## 2013-09-19 NOTE — Progress Notes (Signed)
Pre visit review using our clinic review tool, if applicable. No additional management support is needed unless otherwise documented below in the visit note. 

## 2013-09-19 NOTE — Telephone Encounter (Signed)
Pt tested positive for UTI today, she was having urine frequency, some itching & urine is cloudy.

## 2013-09-19 NOTE — Addendum Note (Signed)
Addended by: Elmer Picker on: 09/19/2013 05:21 PM   Modules accepted: Orders

## 2013-09-21 LAB — URINE CULTURE: Colony Count: >=100000

## 2013-09-25 ENCOUNTER — Telehealth: Payer: Self-pay | Admitting: Family Medicine

## 2013-09-25 NOTE — Telephone Encounter (Signed)
Pt would like UA culture results. Pt will be home after 4 pm tomorrow

## 2013-09-26 NOTE — Telephone Encounter (Signed)
I spoke with pt and gave results.  

## 2013-10-01 ENCOUNTER — Telehealth: Payer: Self-pay | Admitting: Family Medicine

## 2013-10-01 NOTE — Telephone Encounter (Signed)
Pt would like to switch all her rx to [facility]  [id number]  [phone]  Pt request refill solifenacin (VESICARE) 5 MG tablet  90 day Pt would appreciate a cb to to confirm this change.

## 2013-10-02 MED ORDER — SOLIFENACIN SUCCINATE 5 MG PO TABS: 5.0000 mg | ORAL_TABLET | Freq: Every day | ORAL | Status: DC

## 2013-10-02 MED ORDER — OLMESARTAN MEDOXOMIL-HCTZ 20-12.5 MG PO TABS: ORAL_TABLET | ORAL | Status: DC

## 2013-10-02 NOTE — Telephone Encounter (Signed)
I spoke with pt and she also requested Benicar. I sent both scripts e-scribe and updated with new pharmacy.

## 2013-10-21 ENCOUNTER — Telehealth: Payer: Self-pay | Admitting: Family Medicine

## 2013-10-21 NOTE — Telephone Encounter (Signed)
[employer] denied [place] request for Benicar HCT.  I sent a copy of the denial letter back for you to review.   Pt must have failure, contraindication or intolerance to 2 different formulary alternatives such as benazepril/hctz, candesartan/hctz, captopril/hctz, enalapril/hctz, eprosartan, fosinopril/hctz, irbesartan/hctz, lisinopril/hctz, losartan/hctz, moexipril/hctz, perindopril, quinapril/hctz, ramipril, telmisartan/hctz, trandolapril, or valsartan/hctz.

## 2013-10-21 NOTE — Telephone Encounter (Signed)
Switch from Benicar HCT to Losartan HCT 50-12.5 to take daily. Call in one year supply

## 2013-10-21 NOTE — Telephone Encounter (Signed)
I left a voice message for pt to return my call. Does she want to make the change below?

## 2013-10-22 MED ORDER — LOSARTAN POTASSIUM-HCTZ 50-12.5 MG PO TABS: 1.0000 | ORAL_TABLET | Freq: Every day | ORAL | Status: DC

## 2013-10-22 NOTE — Telephone Encounter (Signed)
I spoke with pt and she does want to make the change, send in a 30 day supply to [facility].

## 2013-10-22 NOTE — Telephone Encounter (Signed)
I sent script e-scribe and spoke with pt. 

## 2013-10-28 ENCOUNTER — Encounter: Payer: Self-pay | Admitting: Family Medicine

## 2013-10-28 ENCOUNTER — Ambulatory Visit (INDEPENDENT_AMBULATORY_CARE_PROVIDER_SITE_OTHER): Payer: Managed Care, Other (non HMO) | Admitting: Family Medicine

## 2013-10-28 VITALS — BP 125/81 | HR 83 | Temp 99.6°F | Ht 65.5 in | Wt 213.0 lb

## 2013-10-28 DIAGNOSIS — L989 Disorder of the skin and subcutaneous tissue, unspecified: Secondary | ICD-10-CM

## 2013-10-28 DIAGNOSIS — M25561 Pain in right knee: Secondary | ICD-10-CM

## 2013-10-28 DIAGNOSIS — J0100 Acute maxillary sinusitis, unspecified: Secondary | ICD-10-CM

## 2013-10-28 MED ORDER — AZITHROMYCIN 250 MG PO TABS: ORAL_TABLET | ORAL | Status: DC

## 2013-10-28 NOTE — Progress Notes (Signed)
   Subjective:    Patient ID: Megan Crane, female    DOB: 06/24/1950, 63 y.o.   MRN: [id number]  HPI Here for several things. First she has had one week of sinus pressure, PND, ST, and a dry cough. Also she has had pain in the right knee for a year. No hx of trauma. Also she has a lesion on the right side of the nose that is getting larger.    Review of Systems  Constitutional: Negative.   HENT: Positive for congestion, postnasal drip and sinus pressure.   Eyes: Negative.   Respiratory: Positive for cough.   Musculoskeletal: Positive for arthralgias.       Objective:   Physical Exam  Constitutional: She appears well-developed and well-nourished.  HENT:  Right Ear: External ear normal.  Left Ear: External ear normal.  Nose: Nose normal.  Mouth/Throat: Oropharynx is clear and moist.  Eyes: Conjunctivae are normal.  Pulmonary/Chest: Effort normal and breath sounds normal.  Musculoskeletal:  Right knee is tender along the medial joint space, full ROM, no swelling  Lymphadenopathy:    She has no cervical adenopathy.  Skin:  Large crusty lesion on the right side of the nose           Assessment & Plan:  Treat the sinusitis with a Zpack. Refer to Orthopedics and to Dermatology.

## 2013-10-28 NOTE — Progress Notes (Signed)
Pre visit review using our clinic review tool, if applicable. No additional management support is needed unless otherwise documented below in the visit note. 

## 2014-01-15 ENCOUNTER — Other Ambulatory Visit: Payer: Self-pay | Admitting: Urology

## 2014-01-22 ENCOUNTER — Encounter: Payer: Self-pay | Admitting: Family Medicine

## 2014-01-22 ENCOUNTER — Ambulatory Visit (INDEPENDENT_AMBULATORY_CARE_PROVIDER_SITE_OTHER): Payer: Managed Care, Other (non HMO) | Admitting: Family Medicine

## 2014-01-22 VITALS — BP 149/79 | HR 72 | Temp 98.6°F | Ht 65.5 in | Wt 213.0 lb

## 2014-01-22 DIAGNOSIS — Z22322 Carrier or suspected carrier of Methicillin resistant Staphylococcus aureus: Secondary | ICD-10-CM

## 2014-01-22 DIAGNOSIS — Z87442 Personal history of urinary calculi: Secondary | ICD-10-CM

## 2014-01-22 LAB — POCT URINALYSIS DIPSTICK
BILIRUBIN UA: NEGATIVE
Blood, UA: NEGATIVE
GLUCOSE UA: NEGATIVE
Ketones, UA: NEGATIVE
NITRITE UA: NEGATIVE
PROTEIN UA: NEGATIVE
Spec Grav, UA: 1.01
Urobilinogen, UA: 0.2
pH, UA: 6

## 2014-01-22 MED ORDER — MUPIROCIN 2 % EX OINT: 1.0000 "application " | TOPICAL_OINTMENT | Freq: Two times a day (BID) | CUTANEOUS | Status: DC

## 2014-01-22 NOTE — Progress Notes (Signed)
Pre visit review using our clinic review tool, if applicable. No additional management support is needed unless otherwise documented below in the visit note. 

## 2014-01-22 NOTE — Addendum Note (Signed)
Addended by: Aggie Hacker A on: 01/22/2014 01:04 PM   Modules accepted: Orders

## 2014-01-22 NOTE — Progress Notes (Signed)
   Subjective:    Patient ID: Megan Crane, female    DOB: 05-Aug-1950, 64 y.o.   MRN: [id number]  HPI Here for several issues. First she has concerns about an upcoming procedure. On 02-03-14 she is scheduled for a cystoscopy per Dr. Roni Bread to remove a kidney stone from the right ureter. She has a hx of vasovagal episodes in which her BP drops and she passes out during certain procedures. This has occurred during an ear irrigation to clean out cerumen. She is concerned that this could happen during the cystoscopy when she cannot communicate with the staff. Also the sore lesions in her nostrils have returned. She successfully used Bactroban ointment for these a few years ago.    Review of Systems  Constitutional: Negative.   HENT: Negative for congestion, mouth sores, nosebleeds and postnasal drip.   Eyes: Negative.   Respiratory: Negative.   Cardiovascular: Negative.   Genitourinary: Positive for flank pain. Negative for dysuria, urgency, frequency and hematuria.       Objective:   Physical Exam  Constitutional: She appears well-developed and well-nourished.  Cardiovascular: Normal rate, regular rhythm, normal heart sounds and intact distal pulses.   Pulmonary/Chest: Effort normal and breath sounds normal.          Assessment & Plan:  First I will recommend that Anesthesia be present during her cystoscopy to monitor her BP closely. As long as she is adequately hydrated during the procedure she should do well. Given Bactroban to use prn

## 2014-01-28 ENCOUNTER — Encounter (HOSPITAL_BASED_OUTPATIENT_CLINIC_OR_DEPARTMENT_OTHER): Payer: Self-pay | Admitting: *Deleted

## 2014-01-30 ENCOUNTER — Encounter (HOSPITAL_BASED_OUTPATIENT_CLINIC_OR_DEPARTMENT_OTHER): Payer: Self-pay | Admitting: *Deleted

## 2014-01-30 NOTE — Progress Notes (Addendum)
NPO AFTER MN. ARRIVE AT 0600. NEEDS ISTAT AND EKG.   PT IS VERY ANXIOUS.

## 2014-02-02 NOTE — H&P (Signed)
Active Problems Problems  1. Cystocele, midline (N81.11) 2. Nephrolithiasis (N20.0) 3. Rectocele, female (N81.6) 4. Urge and stress incontinence (N39.46) 5. Urinary tract infection (N39.0) 6. Urinary urgency (R39.15) 7. Uterovaginal prolapse (N81.4)  History of Present Illness Megan Crane returns today at the request of Dr. Matilde Sprang for a ureteral stone.  She has a cystocele and urinary frequency with recurrent UTI's and had a renal US on 12/9 that showed right hydro. A CT was done on 12/29 and she was found to have right hydro with a 46mm right UVJ stone. She has had no pain or hematuria. She has OAB but that has improved with Kegel's. She has not seen a stone pass.   Past Medical History Problems  1. History of Anxiety (F41.9) 2. History of Arthritis 3. History of hypercholesterolemia (Z86.39) 4. History of hypertension (Z86.79)  Surgical History Problems  1. History of Back Surgery 2. History of Hysterectomy 3. History of Kidney Surgery 4. History of Leg Repair 5. History of Posterior Colporrhaphy (For Pelvic Relaxation) 6. History of Rectocele Repair 7. History of Sacrospinous Ligament Fixation For Posthysterectomy Prolapse 8. History of Vaginal Surg Insertion Of Mesh For Pelvic Floor Repair  Current Meds 1. Acetaminophen-Codeine #3 300-30 MG Oral Tablet;  Therapy: [id number] to Recorded 2. Cephalexin 500 MG Oral Capsule; TAKE 1 CAPSULE 3 TIMES DAILY;  Therapy: 93YBO1751 to (Evaluate:24Nov2015)  Requested for: 02HEN2778; Last  Rx:17Nov2015 Ordered 3. Estrace 0.1 MG/GM Vaginal Cream; 1 gm 3x/week x 4 weeks, then 1x per week;  Therapy: 24MPN3614 to (Last Rx:12Nov2015)  Requested for: 43XVQ0086 Ordered 4. Furosemide 20 MG Oral Tablet;  Therapy: 23Jul2013 to Recorded 5. Losartan Potassium-HCTZ 50-12.5 MG Oral Tablet;  Therapy: (Recorded:12Nov2015) to Recorded 6. Nasonex 50 MCG/ACT Nasal Suspension;  Therapy: 19Apr2013 to Recorded 7. Neomycin-Polymyxin-HC 3.5-10000-1 Otic  Suspension;  Therapy: 15Apr2013 to Recorded 8. Tamsulosin HCl - 0.4 MG Oral Capsule; 0.4 mg po daily;  Therapy: 76PPJ0932 to (Last Rx:30Dec2015)  Requested for: 31Dec2015 Ordered 9. Toviaz 8 MG Oral Tablet Extended Release 24 Hour; Take one (1) tablet(s) by mouth once  aday;  Therapy: 22Jan2014 to (Evaluate:17Jan2015); Last Rx:22Jan2014 Ordered 10. VESIcare 5 MG Oral Tablet; a tablet daily for 30 days;   Therapy: 22Jan2014 to (Evaluate:06Jul2015)  Requested for: 67TIW5809; Last   XI:[id number] Ordered  Allergies Medication  1. Percodan TABS 2. Xylocaine SOLN  Family History Problems  1. Family history of Blood In Urine : Father 2. Family history of Cardiac Arrest : Father 3. Family history of Chronic Renal Failure : Father 4. Family history of Colon Cancer : Mother 5. Family history of Death In The Family Father : Father   father passed @ age 61kidney failure 43. Family history of Death In The Family Mother : Mother   mother passed @ age 45cancer 55. Family history of Family Health Status Number Of Children   2 daughters 61. Family history of Kidney Cancer : Father 79. Family history of Nephrolithiasis : Brother  Social History Problems  1. Alcohol Use   one a week 2. Caffeine Use   2 drinks daily 3. Marital History - Currently Married 4. Never A Smoker 5. Occupation:   Scientist, water quality part time 6. Denied: History of Tobacco Use  Review of Systems  Genitourinary: incontinence, but no hematuria.  Gastrointestinal: no nausea and no flank pain.  Constitutional: no fever.    Vitals Vital Signs [Data Includes: Last 1 Day]  Recorded: 76BHA1937 11:12AM  Blood Pressure: 134 / 82 Temperature: 97.7 F Heart Rate: 81  Physical  Exam Constitutional: Well nourished and well developed . No acute distress.  Pulmonary: No respiratory distress and normal respiratory rhythm and effort.  Cardiovascular: Heart rate and rhythm are normal . No peripheral edema.  Abdomen: The abdomen is  mildly obese. The abdomen is soft and nontender. No CVA tenderness.  Skin: Normal skin turgor, no visible rash and no visible skin lesions.  Neuro/Psych:. Mood and affect are appropriate.    Results/Data Urine [Data Includes: Last 1 Day]   [id number]  COLOR YELLOW   APPEARANCE CLEAR   SPECIFIC GRAVITY 1.020   pH 6.0   GLUCOSE NEG mg/dL  BILIRUBIN NEG   KETONE NEG mg/dL  BLOOD NEG   PROTEIN NEG mg/dL  UROBILINOGEN 0.2 mg/dL  NITRITE NEG   LEUKOCYTE ESTERASE TRACE   SQUAMOUS EPITHELIAL/HPF FEW   WBC 0-2 WBC/hpf  RBC 0-2 RBC/hpf  BACTERIA RARE   CRYSTALS NONE SEEN   CASTS NONE SEEN    The following images/tracing/specimen were independently visualized:  I have reviewed her recent renal US and CT films. Her KUB today shows multiple phleboliths in the pelvis so I can't say if she still has a stone. There are no other significant bone, gas or soft tissue abnormalities. Right renal US today shows a 11.57cm kidney with a moderate hydronephrosis and dilation of the proximal ureter to 7.90mm. She has a 6.32mm RUP and 5.63mm RLP echogenic foci but no masses. There is a right ureteral jet. Her bladder is normal but had a volume of 126ml.  The following clinical lab reports were reviewed:  UA reviewed.    Assessment Assessed  1. Calculus of right ureter (N20.1) 2. Hydronephrosis, right (N13.30)  She has persistent hydro with a possible 43mm RUVJ stone but minimal symptoms.   Plan Calculus of right ureter  1. Follow-up Schedule Surgery Office  Follow-up  Status: Hold For - Appointment   Requested for: [id number] 2. RENAL U/S RIGHT; Status:Resulted - Requires Verification;   Done: 75FFM3846 12:00AM Health Maintenance  3. UA With REFLEX; [Do Not Release]; Status:Resulted - Requires Verification;   Done:  65LDJ5701 10:38AM  I am going to set her up for cystoscopy with a right RTG and possible ureteroscopic extraction with possible stent.  I reviewed the risks of bleeding, infection, ureteral  injury, need for stent or secondary procedures, thrombotic events and anesthetic risks.   Discussion/Summary CC: Dr. Alysia Penna.

## 2014-02-02 NOTE — Anesthesia Preprocedure Evaluation (Addendum)
Anesthesia Evaluation  Patient identified by MRN, date of birth, ID band Patient awake    Reviewed: Allergy & Precautions, H&P , NPO status , Patient's Chart, lab work & pertinent test results, reviewed documented beta blocker date and time   History of Anesthesia Complications Negative for: history of anesthetic complications  Airway Mallampati: I  TM Distance: >3 FB Neck ROM: full    Dental  (+) Poor Dentition, Caps, Chipped, Missing, Dental Advisory Given,    Pulmonary neg pulmonary ROS,  breath sounds clear to auscultation  Pulmonary exam normal       Cardiovascular Exercise Tolerance: Good hypertension, On Medications and Pt. on medications Rhythm:regular Rate:Normal  History vasovagal episodes   Neuro/Psych  Headaches, PSYCHIATRIC DISORDERS (anxiety) Anxiety    GI/Hepatic negative GI ROS, Neg liver ROS,   Endo/Other  Morbid obesity  Renal/GU negative Renal ROS  negative genitourinary   Musculoskeletal   Abdominal   Peds  Hematology negative hematology ROS (+)   Anesthesia Other Findings Xylocaine - "made her eyes cross" when given for dental work.  No problem when being numbed for vaginal deliveries.   Reproductive/Obstetrics negative OB ROS                            Anesthesia Physical Anesthesia Plan  ASA: II  Anesthesia Plan: General   Post-op Pain Management:    Induction: Intravenous  Airway Management Planned: LMA  Additional Equipment:   Intra-op Plan:   Post-operative Plan:   Informed Consent:   Plan Discussed with: Surgeon  Anesthesia Plan Comments:         Anesthesia Quick Evaluation

## 2014-02-03 ENCOUNTER — Encounter (HOSPITAL_BASED_OUTPATIENT_CLINIC_OR_DEPARTMENT_OTHER)
Admission: RE | Disposition: A | Discharge status: Home / Self Care | Payer: Self-pay | Source: Ambulatory Visit | Attending: Urology

## 2014-02-03 ENCOUNTER — Encounter (HOSPITAL_BASED_OUTPATIENT_CLINIC_OR_DEPARTMENT_OTHER): Payer: Self-pay | Admitting: *Deleted

## 2014-02-03 ENCOUNTER — Ambulatory Visit (HOSPITAL_BASED_OUTPATIENT_CLINIC_OR_DEPARTMENT_OTHER)
Admission: RE | Admit: 2014-02-03 | Discharge: 2014-02-03 | Disposition: A | Discharge status: Home / Self Care | Payer: Managed Care, Other (non HMO) | Source: Ambulatory Visit | Attending: Urology | Admitting: Urology

## 2014-02-03 ENCOUNTER — Ambulatory Visit (HOSPITAL_BASED_OUTPATIENT_CLINIC_OR_DEPARTMENT_OTHER): Payer: Managed Care, Other (non HMO) | Admitting: Anesthesiology

## 2014-02-03 DIAGNOSIS — N3030 Trigonitis without hematuria: Secondary | ICD-10-CM | POA: Diagnosis not present

## 2014-02-03 DIAGNOSIS — N131 Hydronephrosis with ureteral stricture, not elsewhere classified: Secondary | ICD-10-CM | POA: Insufficient documentation

## 2014-02-03 DIAGNOSIS — Z79899 Other long term (current) drug therapy: Secondary | ICD-10-CM | POA: Diagnosis not present

## 2014-02-03 DIAGNOSIS — N8111 Cystocele, midline: Secondary | ICD-10-CM | POA: Diagnosis not present

## 2014-02-03 DIAGNOSIS — N362 Urethral caruncle: Secondary | ICD-10-CM | POA: Diagnosis not present

## 2014-02-03 DIAGNOSIS — E780 Pure hypercholesterolemia: Secondary | ICD-10-CM | POA: Insufficient documentation

## 2014-02-03 DIAGNOSIS — I10 Essential (primary) hypertension: Secondary | ICD-10-CM | POA: Diagnosis not present

## 2014-02-03 DIAGNOSIS — Z87440 Personal history of urinary (tract) infections: Secondary | ICD-10-CM | POA: Insufficient documentation

## 2014-02-03 DIAGNOSIS — N132 Hydronephrosis with renal and ureteral calculous obstruction: Secondary | ICD-10-CM | POA: Diagnosis present

## 2014-02-03 HISTORY — DX: Carrier or suspected carrier of methicillin resistant Staphylococcus aureus: Z22.322

## 2014-02-03 HISTORY — DX: Personal history of urinary calculi: Z87.442

## 2014-02-03 HISTORY — PX: URETEROSCOPY: SHX842

## 2014-02-03 HISTORY — DX: Unspecified hydronephrosis: N13.30

## 2014-02-03 HISTORY — PX: CYSTOSCOPY WITH BIOPSY: SHX5122

## 2014-02-03 HISTORY — DX: Calculus of ureter: N20.1

## 2014-02-03 HISTORY — PX: CYSTOSCOPY W/ URETERAL STENT PLACEMENT: SHX1429

## 2014-02-03 HISTORY — DX: Anxiety disorder, unspecified: F41.9

## 2014-02-03 LAB — POCT I-STAT 4, (NA,K, GLUC, HGB,HCT)
Glucose, Bld: 103 mg/dL — ABNORMAL HIGH (ref 70–99)
HCT: 39 % (ref 36.0–46.0)
HEMOGLOBIN: 13.3 g/dL (ref 12.0–15.0)
Potassium: 3.9 mmol/L (ref 3.5–5.1)
SODIUM: 140 mmol/L (ref 135–145)

## 2014-02-03 SURGERY — CYSTOSCOPY, WITH RETROGRADE PYELOGRAM AND URETERAL STENT INSERTION
Anesthesia: General | Site: Ureter | Laterality: Right

## 2014-02-03 MED ORDER — MIDAZOLAM HCL 2 MG/2ML IJ SOLN
INTRAMUSCULAR | Status: AC
  Filled 2014-02-03: qty 2

## 2014-02-03 MED ORDER — ACETAMINOPHEN 10 MG/ML IV SOLN
INTRAVENOUS | Status: DC | PRN
  Administered 2014-02-03: 1000 mg via INTRAVENOUS

## 2014-02-03 MED ORDER — ACETAMINOPHEN 650 MG RE SUPP
650.0000 mg | RECTAL | Status: DC | PRN
  Filled 2014-02-03: qty 1

## 2014-02-03 MED ORDER — CEFAZOLIN SODIUM-DEXTROSE 2-3 GM-% IV SOLR
2.0000 g | Freq: Once | INTRAVENOUS | Status: AC
  Administered 2014-02-03: 2 g via INTRAVENOUS
  Filled 2014-02-03: qty 50

## 2014-02-03 MED ORDER — PHENAZOPYRIDINE HCL 100 MG PO TABS
ORAL_TABLET | ORAL | Status: AC
  Filled 2014-02-03: qty 2

## 2014-02-03 MED ORDER — CEPHALEXIN 500 MG PO CAPS: 500.0000 mg | ORAL_CAPSULE | Freq: Three times a day (TID) | ORAL | Status: DC

## 2014-02-03 MED ORDER — DEXAMETHASONE SODIUM PHOSPHATE 10 MG/ML IJ SOLN
INTRAMUSCULAR | Status: DC | PRN
  Administered 2014-02-03: 10 mg via INTRAVENOUS

## 2014-02-03 MED ORDER — CEFAZOLIN SODIUM-DEXTROSE 2-3 GM-% IV SOLR
INTRAVENOUS | Status: AC
  Filled 2014-02-03: qty 50

## 2014-02-03 MED ORDER — BELLADONNA ALKALOIDS-OPIUM 16.2-60 MG RE SUPP
RECTAL | Status: DC | PRN
  Administered 2014-02-03: 1 via RECTAL

## 2014-02-03 MED ORDER — LIDOCAINE HCL (CARDIAC) 20 MG/ML IV SOLN
INTRAVENOUS | Status: DC | PRN
  Administered 2014-02-03: 75 mg via INTRAVENOUS

## 2014-02-03 MED ORDER — MIDAZOLAM HCL 5 MG/5ML IJ SOLN
INTRAMUSCULAR | Status: DC | PRN
  Administered 2014-02-03: 2 mg via INTRAVENOUS

## 2014-02-03 MED ORDER — PROPOFOL INFUSION 10 MG/ML OPTIME
INTRAVENOUS | Status: DC | PRN
  Administered 2014-02-03: 200 mL via INTRAVENOUS

## 2014-02-03 MED ORDER — SCOPOLAMINE 1 MG/3DAYS TD PT72
MEDICATED_PATCH | TRANSDERMAL | Status: AC
  Filled 2014-02-03: qty 1

## 2014-02-03 MED ORDER — ONDANSETRON HCL 4 MG/2ML IJ SOLN
INTRAMUSCULAR | Status: DC | PRN
  Administered 2014-02-03: 4 mg via INTRAVENOUS

## 2014-02-03 MED ORDER — PHENAZOPYRIDINE HCL 200 MG PO TABS
200.0000 mg | ORAL_TABLET | Freq: Three times a day (TID) | ORAL | Status: DC | PRN
Start: 2014-02-03 — End: 2014-10-09

## 2014-02-03 MED ORDER — ACETAMINOPHEN 325 MG PO TABS
650.0000 mg | ORAL_TABLET | ORAL | Status: DC | PRN
Start: 2014-02-03 — End: 2014-02-03
  Filled 2014-02-03: qty 2

## 2014-02-03 MED ORDER — SODIUM CHLORIDE 0.9 % IR SOLN
Status: DC | PRN
  Administered 2014-02-03: 4000 mL

## 2014-02-03 MED ORDER — FENTANYL CITRATE 0.05 MG/ML IJ SOLN
25.0000 ug | INTRAMUSCULAR | Status: DC | PRN
  Filled 2014-02-03: qty 1

## 2014-02-03 MED ORDER — HYDROCODONE-ACETAMINOPHEN 5-325 MG PO TABS: 1.0000 | ORAL_TABLET | Freq: Four times a day (QID) | ORAL | Status: DC | PRN

## 2014-02-03 MED ORDER — SODIUM CHLORIDE 0.9 % IJ SOLN
3.0000 mL | Freq: Two times a day (BID) | INTRAMUSCULAR | Status: DC
Start: 2014-02-03 — End: 2014-02-03
  Filled 2014-02-03: qty 3

## 2014-02-03 MED ORDER — SODIUM CHLORIDE 0.9 % IV SOLN
250.0000 mL | INTRAVENOUS | Status: DC | PRN
  Filled 2014-02-03: qty 250

## 2014-02-03 MED ORDER — SODIUM CHLORIDE 0.9 % IJ SOLN
3.0000 mL | INTRAMUSCULAR | Status: DC | PRN
  Filled 2014-02-03: qty 3

## 2014-02-03 MED ORDER — LACTATED RINGERS IV SOLN
INTRAVENOUS | Status: DC
  Administered 2014-02-03 (×2): via INTRAVENOUS
  Filled 2014-02-03: qty 1000

## 2014-02-03 MED ORDER — SCOPOLAMINE 1 MG/3DAYS TD PT72
1.0000 | MEDICATED_PATCH | TRANSDERMAL | Status: DC
  Filled 2014-02-03: qty 1

## 2014-02-03 MED ORDER — FENTANYL CITRATE 0.05 MG/ML IJ SOLN
INTRAMUSCULAR | Status: AC
  Filled 2014-02-03: qty 4

## 2014-02-03 MED ORDER — OXYCODONE HCL 5 MG PO TABS
5.0000 mg | ORAL_TABLET | ORAL | Status: DC | PRN
  Filled 2014-02-03: qty 2

## 2014-02-03 MED ORDER — BELLADONNA ALKALOIDS-OPIUM 16.2-60 MG RE SUPP
RECTAL | Status: AC
  Filled 2014-02-03: qty 1

## 2014-02-03 MED ORDER — PHENAZOPYRIDINE HCL 200 MG PO TABS
200.0000 mg | ORAL_TABLET | Freq: Once | ORAL | Status: AC
  Administered 2014-02-03: 200 mg via ORAL
  Filled 2014-02-03: qty 1

## 2014-02-03 MED ORDER — PHENYLEPHRINE HCL 10 MG/ML IJ SOLN
INTRAMUSCULAR | Status: DC | PRN
  Administered 2014-02-03 (×2): 120 ug via INTRAVENOUS

## 2014-02-03 MED ORDER — LACTATED RINGERS IV SOLN
INTRAVENOUS | Status: DC
  Filled 2014-02-03: qty 1000

## 2014-02-03 MED ORDER — IOHEXOL 350 MG/ML SOLN
INTRAVENOUS | Status: DC | PRN
Start: 2014-02-03 — End: 2014-02-03
  Administered 2014-02-03: 30 mL

## 2014-02-03 SURGICAL SUPPLY — 39 items
BAG DRAIN URO-CYSTO SKYTR STRL (DRAIN) ×6 IMPLANT
BAG DRN UROCATH (DRAIN) ×5
BASKET LASER NITINOL 1.9FR (BASKET) IMPLANT
BASKET ZERO TIP NITINOL 2.4FR (BASKET) IMPLANT
BSKT STON RTRVL 120 1.9FR (BASKET)
BSKT STON RTRVL ZERO TP 2.4FR (BASKET)
CANISTER SUCT LVC 12 LTR MEDI- (MISCELLANEOUS) ×3 IMPLANT
CATH URET 5FR 28IN CONE TIP (BALLOONS)
CATH URET 5FR 28IN OPEN ENDED (CATHETERS) IMPLANT
CATH URET 5FR 70CM CONE TIP (BALLOONS) IMPLANT
CLOTH BEACON ORANGE TIMEOUT ST (SAFETY) ×6 IMPLANT
DRAPE CAMERA CLOSED 9X96 (DRAPES) ×6 IMPLANT
ELECT REM PT RETURN 9FT ADLT (ELECTROSURGICAL) ×6
ELECTRODE REM PT RTRN 9FT ADLT (ELECTROSURGICAL) ×2 IMPLANT
FIBER LASER FLEXIVA 1000 (UROLOGICAL SUPPLIES) IMPLANT
FIBER LASER FLEXIVA 200 (UROLOGICAL SUPPLIES) IMPLANT
FIBER LASER FLEXIVA 365 (UROLOGICAL SUPPLIES) IMPLANT
FIBER LASER FLEXIVA 550 (UROLOGICAL SUPPLIES) IMPLANT
GLOVE BIO SURGEON STRL SZ 6.5 (GLOVE) ×3 IMPLANT
GLOVE BIOGEL PI IND STRL 6.5 (GLOVE) ×4 IMPLANT
GLOVE BIOGEL PI INDICATOR 6.5 (GLOVE) ×2
GLOVE SURG SS PI 8.0 STRL IVOR (GLOVE) ×6 IMPLANT
GOWN PREVENTION PLUS LG XLONG (DISPOSABLE) IMPLANT
GOWN STRL REIN XL XLG (GOWN DISPOSABLE) ×6 IMPLANT
GOWN STRL REUS W/TWL LRG LVL3 (GOWN DISPOSABLE) ×3 IMPLANT
GOWN STRL REUS W/TWL XL LVL3 (GOWN DISPOSABLE) ×3 IMPLANT
GUIDEWIRE 0.038 PTFE COATED (WIRE) IMPLANT
GUIDEWIRE ANG ZIPWIRE 038X150 (WIRE) IMPLANT
GUIDEWIRE STR DUAL SENSOR (WIRE) ×6 IMPLANT
IV NS IRRIG 3000ML ARTHROMATIC (IV SOLUTION) ×6 IMPLANT
KIT BALLIN UROMAX 15FX10 (LABEL) IMPLANT
KIT BALLN UROMAX 15FX4 (MISCELLANEOUS) ×2 IMPLANT
KIT BALLN UROMAX 26 75X4 (MISCELLANEOUS) ×1
PACK CYSTO (CUSTOM PROCEDURE TRAY) ×6 IMPLANT
SET HIGH PRES BAL DIL (LABEL)
SHEATH ACCESS URETERAL 24CM (SHEATH) ×3 IMPLANT
SHEATH ACCESS URETERAL 38CM (SHEATH) IMPLANT
STENT URET 6FRX26 CONTOUR (STENTS) ×3 IMPLANT
UROMAX ×3 IMPLANT

## 2014-02-03 NOTE — Transfer of Care (Signed)
Immediate Anesthesia Transfer of Care Note  Patient: Megan Crane  Procedure(s) Performed: Procedure(s): CYSTOSCOPY WITH  RIGHT RETROGRADE PYELOGRAM/ RIGHT URETERAL STENT PLACEMENT URETEROSCOPY WITH MANAGEMENT OF URETERAL STRICTURE (Right) CYSTOSCOPY WITH BIOPSY  Patient Location: PACU  Anesthesia Type:General  Level of Consciousness: awake, alert , oriented and patient cooperative  Airway & Oxygen Therapy: Patient Spontanous Breathing  Post-op Assessment: Report given to PACU RN and Post -op Vital signs reviewed and stable  Post vital signs: Reviewed and stable  Complications: No apparent anesthesia complications

## 2014-02-03 NOTE — Anesthesia Procedure Notes (Signed)
Procedure Name: LMA Insertion Date/Time: 02/03/2014 7:38 AM Performed by: Wanita Chamberlain Pre-anesthesia Checklist: Patient identified, Emergency Drugs available, Suction available, Patient being monitored and Timeout performed Patient Re-evaluated:Patient Re-evaluated prior to inductionOxygen Delivery Method: Circle system utilized Preoxygenation: Pre-oxygenation with 100% oxygen Intubation Type: IV induction Ventilation: Mask ventilation without difficulty LMA: LMA inserted LMA Size: 4.0 Airway Equipment and Method: Bite block Placement Confirmation: positive ETCO2 Tube secured with: Tape Dental Injury: Teeth and Oropharynx as per pre-operative assessment

## 2014-02-03 NOTE — Interval H&P Note (Signed)
History and Physical Interval Note:  She continues to have mild symptoms.   02/03/2014 7:26 AM  Megan Crane  has presented today for surgery, with the diagnosis of right UVJ Stone with Hydronephrosis  The various methods of treatment have been discussed with the patient and family. After consideration of risks, benefits and other options for treatment, the patient has consented to  Procedure(s): CYSTOSCOPY/RIGHT RETROGRADE/POSSIBLE URETEROSCOPY/STONE EXTRACTION WITH POSSIBLE STENT (Right) HOLMIUM LASER APPLICATION (N/A) as a surgical intervention .  The patient's history has been reviewed, patient examined, no change in status, stable for surgery.  I have reviewed the patient's chart and labs.  Questions were answered to the patient's satisfaction.     Edgar Reisz J

## 2014-02-03 NOTE — Brief Op Note (Signed)
02/03/2014  8:17 AM  PATIENT:  Marina Gravel  64 y.o. female  PRE-OPERATIVE DIAGNOSIS:  right UVJ Stone with Hydronephrosis  POST-OPERATIVE DIAGNOSIS:  right UVJ stricture with Hydronephrosis, polyp at the right ureteral orifice  PROCEDURE:  Procedure(s): CYSTOSCOPY WITH  RIGHT RETROGRADE PYELOGRAM/ RIGHT URETERAL STENT PLACEMENT URETEROSCOPY WITH MANAGEMENT OF URETERAL STRICTURE (Right) CYSTOSCOPY WITH BIOPSY  SURGEON:  Surgeon(s) and Role:    * Malka So, MD - Primary  PHYSICIAN ASSISTANT:   ASSISTANTS: none   ANESTHESIA:   general  EBL:     BLOOD ADMINISTERED:none  DRAINS: 6 x 26 JJ stent right   LOCAL MEDICATIONS USED:  NONE  SPECIMEN:  Source of Specimen:  polyp from right UO  DISPOSITION OF SPECIMEN:  PATHOLOGY  COUNTS:  YES  TOURNIQUET:  * No tourniquets in log *  DICTATION: .Other Dictation: Dictation Number [id number]  PLAN OF CARE: Discharge to home after PACU  PATIENT DISPOSITION:  PACU - hemodynamically stable.   Delay start of Pharmacological VTE agent (>24hrs) due to surgical blood loss or risk of bleeding: not applicable

## 2014-02-03 NOTE — Anesthesia Postprocedure Evaluation (Signed)
  Anesthesia Post-op Note  Patient: Megan Crane  Procedure(s) Performed: Procedure(s) (LRB): CYSTOSCOPY WITH  RIGHT RETROGRADE PYELOGRAM/ RIGHT URETERAL STENT PLACEMENT URETEROSCOPY WITH MANAGEMENT OF URETERAL STRICTURE (Right) CYSTOSCOPY WITH BIOPSY  Patient Location: PACU  Anesthesia Type: General  Level of Consciousness: awake and alert   Airway and Oxygen Therapy: Patient Spontanous Breathing  Post-op Pain: mild  Post-op Assessment: Post-op Vital signs reviewed, Patient's Cardiovascular Status Stable, Respiratory Function Stable, Patent Airway and No signs of Nausea or vomiting  Last Vitals:  Filed Vitals:   02/03/14 0905  BP: 105/63  Pulse: 76  Temp:   Resp:     Post-op Vital Signs: stable   Complications: No apparent anesthesia complications

## 2014-02-03 NOTE — Discharge Instructions (Addendum)
Ureteral Stent Implantation Ureteral stent implantation is the implantation of a soft plastic tube with multiple holes into the tube that drains urine from your kidney to your bladder (ureter). The stent helps drain your kidney when there is a blockage of the flow of urine in your ureter. The stent has a coil on each end to keep it from falling out. One end stays in the kidney. The other end stays in the bladder. It is most often taken out after any blockage has been removed or your ureter has healed. Short-term stents have a string attached to make removal quite easy. Removal of a short-term stent can be done in your health care provider's office or by you at home. Long-term stents need to be changed every few months. LET [facility] CARE PROVIDER KNOW ABOUT: 1. Any allergies you have. 2. All medicines you are taking, including vitamins, herbs, eye drops, creams, and over-the-counter medicines. 3. Previous problems you or members of your family have had with the use of anesthetics. 4. Any blood disorders you have. 5. Previous surgeries you have had. 6. Medical conditions you have. RISKS AND COMPLICATIONS Generally, ureteral stent implantation is a safe procedure. However, as with any procedure, complications can occur. Possible complications include:  Movement of the stent away from where it was originally placed (migration). This may affect the ability of the stent to properly drain your kidney. If migration of the stent occurs, the stent may need to be replaced or repositioned.  Perforation of the ureter.  Infection. BEFORE THE PROCEDURE  You may be asked to wash your genital area with sterile soap the morning of your procedure.  You may be given an oral antibiotic which you should take with a sip of water as prescribed by your health care provider.  You may be asked to not eat or drink for 8 hours before the surgery. PROCEDURE  First you will be given an anesthetic so you do not feel  pain during the procedure.  Your health care provider will insert a special lighted instrument called a cystoscope into your bladder. This allows your health care provider to see the opening to your ureter.  A thin wire is carefully threaded into your bladder and up the ureter. The stent is inserted over the wire and the wire is then removed.  Your bladder will be emptied of urine. AFTER THE PROCEDURE You will be taken to a recovery room until it is okay for you to go home. Document Released: 12/24/1999 Document Revised: 12/31/2012 Document Reviewed: 06/04/2012 [facility] Patient Information 2015 [place], [place]. This information is not intended to replace advice given to you by your health care provider. Make sure you discuss any questions you have with your health care provider.  [facility] [phone] Post Ureteroscopy With or Without Stent Instructions  Definitions:  Ureter: The duct that transports urine from the kidney to the bladder. Stent:   A plastic hollow tube that is placed into the ureter, from the kidney to the                 bladder to prevent the ureter from swelling shut.  GENERAL INSTRUCTIONS:  Despite the fact that no skin incisions were used, the area around the ureter and bladder is raw and irritated. The stent is a foreign body which will further irritate the bladder wall. This irritation is manifested by increased frequency of urination, both day and night, and by an increase in the urge to urinate. In  some, the urge to urinate is present almost always. Sometimes the urge is strong enough that you may not be able to stop yourself from urinating. The only real cure is to remove the stent and then give time for the bladder wall to heal which can't be done until the danger of the ureter swelling shut has passed, which varies.  You may see some blood in your urine while the stent is in place and a few days afterwards. Do not be alarmed, even if the urine  was clear for a while. Get off your feet and drink lots of fluids until clearing occurs. If you start to pass clots or don't improve, call us.  DIET: You may return to your normal diet immediately. Because of the raw surface of your bladder, alcohol, spicy foods, acid type foods and drinks with caffeine may cause irritation or frequency and should be used in moderation. To keep your urine flowing freely and to avoid constipation, drink plenty of fluids during the day ( 8-10 glasses ). Tip: Avoid cranberry juice because it is very acidic.  ACTIVITY: Your physical activity doesn't need to be restricted. However, if you are very active, you may see some blood in your urine. We suggest that you reduce your activity under these circumstances until the bleeding has stopped.  BOWELS: It is important to keep your bowels regular during the postoperative period. Straining with bowel movements can cause bleeding. A bowel movement every other day is reasonable. Use a mild laxative if needed, such as Milk of Magnesia 2-3 tablespoons, or 2 Dulcolax tablets. Call if you continue to have problems. If you have been taking narcotics for pain, before, during or after your surgery, you may be constipated. Take a laxative if necessary.   MEDICATION: You should resume your pre-surgery medications unless told not to. In addition you will often be given an antibiotic to prevent infection. These should be taken as prescribed until the bottles are finished unless you are having an unusual reaction to one of the drugs.  PROBLEMS YOU SHOULD REPORT TO [place]:  Fevers over 100.5 Fahrenheit.  Heavy bleeding, or clots ( See above notes about blood in urine ).  Inability to urinate.  Drug reactions ( hives, rash, nausea, vomiting, diarrhea ).  Severe burning or pain with urination that is not improving.  FOLLOW-UP: You will need a follow-up appointment to monitor your progress. Call for this appointment at the number listed  above. Usually the first appointment will be about three to fourteen days after your surgery.   Post Anesthesia Home Care Instructions  Activity: Get plenty of rest for the remainder of the day. A responsible adult should stay with you for 24 hours following the procedure.  For the next 24 hours, DO NOT: -Drive a car -Paediatric nurse -Drink alcoholic beverages -Take any medication unless instructed by your physician -Make any legal decisions or sign important papers.  Meals: Start with liquid foods such as gelatin or soup. Progress to regular foods as tolerated. Avoid greasy, spicy, heavy foods. If nausea and/or vomiting occur, drink only clear liquids until the nausea and/or vomiting subsides. Call your physician if vomiting continues.  Special Instructions/Symptoms: Your throat may feel dry or sore from the anesthesia or the breathing tube placed in your throat during surgery. If this causes discomfort, gargle with warm salt water. The discomfort should disappear within 24 hours.

## 2014-02-04 ENCOUNTER — Encounter (HOSPITAL_BASED_OUTPATIENT_CLINIC_OR_DEPARTMENT_OTHER): Payer: Self-pay | Admitting: Urology

## 2014-02-05 NOTE — Op Note (Signed)
NAMEHAYLEEN, Megan Crane               ACCOUNT NO.:  [id number]  MEDICAL RECORD NO.:  [id number]  LOCATION:                                 FACILITY:  PHYSICIAN:  Marshall Cork. Jeffie Pollock, M.D.    DATE OF BIRTH:  [birth year]  DATE OF PROCEDURE:  02/03/2014 DATE OF DISCHARGE:  02/03/2014                              OPERATIVE REPORT   PROCEDURE: 1. Cystoscopy with right retrograde pyelogram with interpretation. 2. Right ureteroscopy with management of right ureteral stricture. 3. Placement of right double-J stent. 4. Biopsy of bladder with fulguration.  PREOPERATIVE DIAGNOSIS:  Right distal ureteral stone with hydronephrosis.  POSTOPERATIVE DIAGNOSES: 1. Hydronephrosis with a right distal ureteral stricture. 2. Urethral meatal polyp. 3. Follicular cystitis.  SURGEON:  Marshall Cork. Jeffie Pollock, M.D.  ANESTHESIA:  General.  SPECIMEN:  Bladder biopsy from right UO.  DRAINS:  A 6-French x 26 cm double-J stent.  BLOOD LOSS:  Minimal.  COMPLICATIONS:  None.  INDICATIONS:  Megan Crane is a 64 year old white female, who was initially sent with CT findings of right hydronephrosis and a 2 mm distal stone. She had persistent hydro on followup ultrasound, but a KUB was difficult to interpret due to multiple pelvic phleboliths.  However, it was felt that ureteroscopic extraction of stone was indicated because of the persistent hydro and residual symptoms.  FINDINGS AND PROCEDURE:  She was given 2 g of Ancef.  She was taken to the operating room, where general anesthetic was induced.  He was fitted with PAS hose and placed in the lithotomy position.  Her perineum and genitalia were prepped with Betadine solution and she was draped in the usual sterile fashion.  Cystoscopy was performed using the 22-French scope with a 12 and 70- degree lenses.  She was noted on introduction of the scope to have approximately a grade 3 cystocele and during cystoscopy, it was difficult to see the ureteral orifices because of  the deep bas font.  I rectified this by placing five 4x4s in the vaginal wall to elevate the base of the bladder.  During inspection of the bladder, she was noted to have diffuse changes consistent with follicular cystitis.  There were no tumors or stones identified.  The left ureteral orifice was otherwise unremarkable.  The right ureteral orifice had a small approximately 3 mm polyp arising from the medial edge.  Once thorough inspection of the bladder had been performed, a 5-French open-end catheter was passed through the scope with a 12-degree lens in place and the right ureteral orifice was cannulated and contrast was instilled.  This revealed approximately a 1 to 1.5 cm area of narrowing in the intramural ureter with proximal dilation that was quite marked with distal ureteral diameter that was approximately 1.5 to 2 cm.  This dilation persisted to the kidney which was also quite dilated.  Once the retrograde pyelogram was performed, a guidewire was passed through the open-end catheter to the kidney and the cystoscope and open- end catheter were removed.  A 24 cm 12-French digital access sheath inner core was used to dilate the distal ureter.  The 6.5-French short ureteroscope was then passed alongside the wire.  I did not identify a  stone proximal to the narrowed area and advanced the scope all the way to the renal pelvis.  No stones were seen along the length of the ureter.  Reinspection of the distal ureter on removal revealed findings consistent with an inflammatory-type stricture.  I did inspect the bladder as well to see if the stone had been dislodged during dilation, but none was seen.  At this point, the polyp on the medial lip of the ureteral orifice was biopsied with a cup biopsy forceps, and the biopsy bed was gently fulgurated with care taken to avoid excess fulguration of the meatus.  At this point, the cystoscope was reinserted over the guidewire and a 4 cm  15-French high-pressure balloon was passed over the wire across the ureteral stricture.  The balloon was inflated to 18 atmospheres and left inflated for approximately a minute and a half before deflation and attempt to dilate the stricture more completely.  At this point, the balloon was removed and a 6-French 26-cm double-J stent without string was passed to the kidney under fluoroscopic guidance.  The wire was removed leaving a good coil in the kidney and a good coil in the bladder.  My intent is to leave the stent for approximately 2 weeks.  At this point, the bladder was drained.  The cystoscope was removed.  B and O suppository was placed.  She was taken down from lithotomy position.  Her anesthetic was reversed and she was moved to the recovery room in stable condition.  There were no complications.     Marshall Cork. Jeffie Pollock, M.D.     JJW/MEDQ  D:  02/03/2014  T:  02/03/2014  Job:  [id number]

## 2014-02-16 ENCOUNTER — Other Ambulatory Visit: Payer: Self-pay | Admitting: Urology

## 2014-02-16 DIAGNOSIS — N135 Crossing vessel and stricture of ureter without hydronephrosis: Secondary | ICD-10-CM

## 2014-02-17 ENCOUNTER — Ambulatory Visit (INDEPENDENT_AMBULATORY_CARE_PROVIDER_SITE_OTHER): Payer: Managed Care, Other (non HMO) | Admitting: Family Medicine

## 2014-02-17 ENCOUNTER — Encounter: Payer: Self-pay | Admitting: Family Medicine

## 2014-02-17 VITALS — BP 121/73 | HR 83 | Temp 99.0°F | Ht 65.0 in | Wt 214.0 lb

## 2014-02-17 DIAGNOSIS — J0100 Acute maxillary sinusitis, unspecified: Secondary | ICD-10-CM

## 2014-02-17 MED ORDER — AZITHROMYCIN 250 MG PO TABS
ORAL_TABLET | ORAL | Status: DC
Start: 2014-02-17 — End: 2014-04-28

## 2014-02-17 NOTE — Progress Notes (Signed)
   Subjective:    Patient ID: Megan Crane, female    DOB: May 24, 1950, 64 y.o.   MRN: [id number]  HPI Here for several days of sinus pressure, ear pain, and ST. No cough or fever. She had a ureteral stent removed yesterday.    Review of Systems  Constitutional: Negative.   HENT: Positive for congestion, postnasal drip and sinus pressure.   Eyes: Negative.   Respiratory: Negative.        Objective:   Physical Exam  Constitutional: She appears well-developed and well-nourished.  HENT:  Right Ear: External ear normal.  Left Ear: External ear normal.  Nose: Nose normal.  Mouth/Throat: Oropharynx is clear and moist.  Eyes: Conjunctivae are normal.  Pulmonary/Chest: Effort normal and breath sounds normal.  Lymphadenopathy:    She has no cervical adenopathy.          Assessment & Plan:  Recheck prn

## 2014-02-17 NOTE — Progress Notes (Signed)
Pre visit review using our clinic review tool, if applicable. No additional management support is needed unless otherwise documented below in the visit note. 

## 2014-02-26 ENCOUNTER — Ambulatory Visit (HOSPITAL_COMMUNITY)
Admission: RE | Admit: 2014-02-26 | Discharge: 2014-02-26 | Disposition: A | Discharge status: Home / Self Care | Payer: Managed Care, Other (non HMO) | Source: Ambulatory Visit | Attending: Urology | Admitting: Urology

## 2014-02-26 DIAGNOSIS — N135 Crossing vessel and stricture of ureter without hydronephrosis: Secondary | ICD-10-CM | POA: Diagnosis not present

## 2014-02-26 MED ORDER — FUROSEMIDE 10 MG/ML IJ SOLN
INTRAMUSCULAR | Status: AC
  Administered 2014-02-26: 48.4 mg via INTRAVENOUS
  Filled 2014-02-26: qty 4

## 2014-02-26 MED ORDER — TECHNETIUM TC 99M MERTIATIDE
16.0000 | Freq: Once | INTRAVENOUS | Status: AC | PRN
  Administered 2014-02-26: 16 via INTRAVENOUS

## 2014-02-26 MED ORDER — FUROSEMIDE 10 MG/ML IJ SOLN
48.4000 mg | Freq: Once | INTRAMUSCULAR | Status: AC
  Administered 2014-02-26: 48.4 mg via INTRAVENOUS
  Filled 2014-02-26: qty 6

## 2014-04-28 ENCOUNTER — Ambulatory Visit (INDEPENDENT_AMBULATORY_CARE_PROVIDER_SITE_OTHER): Payer: Managed Care, Other (non HMO) | Admitting: Family Medicine

## 2014-04-28 ENCOUNTER — Encounter: Payer: Self-pay | Admitting: Family Medicine

## 2014-04-28 VITALS — BP 130/76 | HR 89 | Temp 98.5°F | Ht 65.0 in | Wt 214.0 lb

## 2014-04-28 DIAGNOSIS — J0100 Acute maxillary sinusitis, unspecified: Secondary | ICD-10-CM

## 2014-04-28 MED ORDER — EFINACONAZOLE 10 % EX SOLN: 1.0000 "application " | Freq: Two times a day (BID) | CUTANEOUS | Status: DC

## 2014-04-28 MED ORDER — HYDROCODONE-HOMATROPINE 5-1.5 MG/5ML PO SYRP: 5.0000 mL | ORAL_SOLUTION | ORAL | Status: DC | PRN

## 2014-04-28 MED ORDER — AZITHROMYCIN 250 MG PO TABS: ORAL_TABLET | ORAL | Status: DC

## 2014-04-28 NOTE — Progress Notes (Signed)
   Subjective:    Patient ID: Megan Crane, female    DOB: Dec 06, 1950, 64 y.o.   MRN: [id number]  HPI Here for one week of sinus pressure, PND, ST, and a dry cough. No fever.    Review of Systems  Constitutional: Negative.   HENT: Positive for congestion, postnasal drip and sinus pressure.   Eyes: Negative.   Respiratory: Positive for cough.        Objective:   Physical Exam  Constitutional: She appears well-developed and well-nourished.  HENT:  Right Ear: External ear normal.  Left Ear: External ear normal.  Nose: Nose normal.  Mouth/Throat: Oropharynx is clear and moist.  Eyes: Conjunctivae are normal.  Pulmonary/Chest: Effort normal and breath sounds normal.  Lymphadenopathy:    She has no cervical adenopathy.          Assessment & Plan:  Add Mucinex

## 2014-04-28 NOTE — Progress Notes (Signed)
Pre visit review using our clinic review tool, if applicable. No additional management support is needed unless otherwise documented below in the visit note. 

## 2014-05-29 ENCOUNTER — Ambulatory Visit (INDEPENDENT_AMBULATORY_CARE_PROVIDER_SITE_OTHER): Payer: Managed Care, Other (non HMO) | Admitting: Family Medicine

## 2014-05-29 ENCOUNTER — Encounter: Payer: Self-pay | Admitting: Family Medicine

## 2014-05-29 VITALS — BP 111/76 | HR 71 | Temp 98.8°F | Ht 65.0 in | Wt 214.0 lb

## 2014-05-29 DIAGNOSIS — R609 Edema, unspecified: Secondary | ICD-10-CM | POA: Insufficient documentation

## 2014-05-29 DIAGNOSIS — I10 Essential (primary) hypertension: Secondary | ICD-10-CM

## 2014-05-29 DIAGNOSIS — E785 Hyperlipidemia, unspecified: Secondary | ICD-10-CM | POA: Diagnosis not present

## 2014-05-29 LAB — BASIC METABOLIC PANEL
BUN: 20 mg/dL (ref 6–23)
CALCIUM: 10.2 mg/dL (ref 8.4–10.5)
CO2: 30 mEq/L (ref 19–32)
CREATININE: 0.74 mg/dL (ref 0.40–1.20)
Chloride: 104 mEq/L (ref 96–112)
GFR: 84.63 mL/min (ref >60.00)
Glucose, Bld: 78 mg/dL (ref 70–99)
Potassium: 4.7 mEq/L (ref 3.5–5.1)
SODIUM: 138 meq/L (ref 135–145)

## 2014-05-29 LAB — CBC WITH DIFFERENTIAL/PLATELET
BASOS PCT: 0.7 % (ref 0.0–3.0)
Basophils Absolute: 0 10*3/uL (ref 0.0–0.1)
EOS ABS: 0.1 10*3/uL (ref 0.0–0.7)
Eosinophils Relative: 2.2 % (ref 0.0–5.0)
HCT: 38.9 % (ref 36.0–46.0)
HEMOGLOBIN: 13.2 g/dL (ref 12.0–15.0)
LYMPHS ABS: 1.9 10*3/uL (ref 0.7–4.0)
Lymphocytes Relative: 28.1 % (ref 12.0–46.0)
MCHC: 34 g/dL (ref 30.0–36.0)
MCV: 88.2 fl (ref 78.0–100.0)
MONO ABS: 0.5 10*3/uL (ref 0.1–1.0)
MONOS PCT: 7.3 % (ref 3.0–12.0)
Neutro Abs: 4.1 10*3/uL (ref 1.4–7.7)
Neutrophils Relative %: 61.7 % (ref 43.0–77.0)
Platelets: 198 10*3/uL (ref 150.0–400.0)
RBC: 4.41 Mil/uL (ref 3.87–5.11)
RDW: 14.4 % (ref 11.5–15.5)
WBC: 6.6 10*3/uL (ref 4.0–10.5)

## 2014-05-29 LAB — HEPATIC FUNCTION PANEL
ALBUMIN: 4.1 g/dL (ref 3.5–5.2)
ALT: 20 U/L (ref 0–35)
AST: 20 U/L (ref 0–37)
Alkaline Phosphatase: 69 U/L (ref 39–117)
Bilirubin, Direct: 0.1 mg/dL (ref 0.0–0.3)
TOTAL PROTEIN: 7 g/dL (ref 6.0–8.3)
Total Bilirubin: 0.4 mg/dL (ref 0.2–1.2)

## 2014-05-29 LAB — TSH: TSH: 1.39 u[IU]/mL (ref 0.35–4.50)

## 2014-05-29 MED ORDER — OLMESARTAN MEDOXOMIL-HCTZ 40-25 MG PO TABS: 1.0000 | ORAL_TABLET | Freq: Every day | ORAL | Status: DC

## 2014-05-29 NOTE — Progress Notes (Signed)
   Subjective:    Patient ID: Megan Crane, female    DOB: 07/04/1950, 64 y.o.   MRN: [id number]  HPI Here to discuss swelling in the hands and feet which started a few weeks ago. There is no pain or discomfort. No SOB. Her BP has been stable, but she thinks the swelling is the result of when we switched from Benicar to Losartan . This was done for insurance reasons. She has used Lasix in the past but not for some time. She is currently taking Macrobid per Dr. McDiarmid for a UTI.    Review of Systems  Constitutional: Negative.   HENT: Negative.   Respiratory: Negative.   Cardiovascular: Positive for leg swelling. Negative for chest pain and palpitations.       Objective:   Physical Exam  Constitutional: She appears well-developed and well-nourished.  Cardiovascular: Normal rate, regular rhythm, normal heart sounds and intact distal pulses.   Pulmonary/Chest: Effort normal and breath sounds normal.  Musculoskeletal:  1+ edema in both hands and both feet           Assessment & Plan:  Extremity edema. We will get labs to day to check renal function, etc. Switch from Losartan HCT to Benicar HCT.

## 2014-05-29 NOTE — Progress Notes (Signed)
Pre visit review using our clinic review tool, if applicable. No additional management support is needed unless otherwise documented below in the visit note. 

## 2014-06-03 ENCOUNTER — Other Ambulatory Visit: Payer: Managed Care, Other (non HMO)

## 2014-06-05 ENCOUNTER — Other Ambulatory Visit (INDEPENDENT_AMBULATORY_CARE_PROVIDER_SITE_OTHER): Payer: Managed Care, Other (non HMO)

## 2014-06-05 DIAGNOSIS — E785 Hyperlipidemia, unspecified: Secondary | ICD-10-CM

## 2014-06-05 LAB — LIPID PANEL
CHOL/HDL RATIO: 4
CHOLESTEROL: 189 mg/dL (ref 0–200)
HDL: 44 mg/dL (ref >39.00)
LDL CALC: 119 mg/dL — AB (ref 0–99)
NonHDL: 145
TRIGLYCERIDES: 128 mg/dL (ref 0.0–149.0)
VLDL: 25.6 mg/dL (ref 0.0–40.0)

## 2014-06-09 ENCOUNTER — Encounter: Payer: Self-pay | Admitting: Family Medicine

## 2014-06-09 ENCOUNTER — Ambulatory Visit (INDEPENDENT_AMBULATORY_CARE_PROVIDER_SITE_OTHER): Payer: Managed Care, Other (non HMO) | Admitting: Family Medicine

## 2014-06-09 VITALS — BP 135/81 | HR 105 | Temp 100.0°F | Ht 65.0 in | Wt 214.0 lb

## 2014-06-09 DIAGNOSIS — J209 Acute bronchitis, unspecified: Secondary | ICD-10-CM | POA: Diagnosis not present

## 2014-06-09 MED ORDER — AMOXICILLIN-POT CLAVULANATE 875-125 MG PO TABS: 1.0000 | ORAL_TABLET | Freq: Two times a day (BID) | ORAL | Status: DC

## 2014-06-09 NOTE — Progress Notes (Signed)
Pre visit review using our clinic review tool, if applicable. No additional management support is needed unless otherwise documented below in the visit note. 

## 2014-06-09 NOTE — Progress Notes (Signed)
   Subjective:    Patient ID: Megan Crane, female    DOB: 10-08-1950, 64 y.o.   MRN: [id number]  HPI Here for 3 days of chest tightness, fever, and coughing up green sputum. No SOB or chest pain. Taking Motrin and drinking fluids.    Review of Systems  Constitutional: Positive for fever.  HENT: Negative.   Eyes: Negative.   Respiratory: Positive for cough and chest tightness. Negative for shortness of breath and wheezing.   Cardiovascular: Negative.        Objective:   Physical Exam  Constitutional: She appears well-developed and well-nourished.  HENT:  Right Ear: External ear normal.  Left Ear: External ear normal.  Nose: Nose normal.  Mouth/Throat: Oropharynx is clear and moist.  Eyes: Conjunctivae are normal.  Pulmonary/Chest: Effort normal. No respiratory distress. She has no wheezes. She has no rales.  Scattered rhonchi   Lymphadenopathy:    She has no cervical adenopathy.          Assessment & Plan:  Bronchitis. Treat with Augmentin.

## 2014-06-18 ENCOUNTER — Other Ambulatory Visit: Payer: Self-pay | Admitting: Family Medicine

## 2014-06-18 MED ORDER — HYDROCODONE-HOMATROPINE 5-1.5 MG/5ML PO SYRP: 5.0000 mL | ORAL_SOLUTION | ORAL | Status: DC | PRN

## 2014-06-18 NOTE — Telephone Encounter (Signed)
Called and left a message that rx is ready for pick up.

## 2014-06-18 NOTE — Telephone Encounter (Signed)
Pt was seen on 5-31 and needs another rx for hydromet cough med

## 2014-06-18 NOTE — Telephone Encounter (Signed)
done

## 2014-07-30 ENCOUNTER — Other Ambulatory Visit: Payer: Self-pay

## 2014-10-09 ENCOUNTER — Encounter: Payer: Self-pay | Admitting: Family Medicine

## 2014-10-09 ENCOUNTER — Ambulatory Visit (INDEPENDENT_AMBULATORY_CARE_PROVIDER_SITE_OTHER): Payer: Managed Care, Other (non HMO) | Admitting: Family Medicine

## 2014-10-09 VITALS — BP 112/68 | HR 72 | Temp 98.5°F | Ht 65.0 in | Wt 210.0 lb

## 2014-10-09 DIAGNOSIS — R350 Frequency of micturition: Secondary | ICD-10-CM

## 2014-10-09 DIAGNOSIS — N390 Urinary tract infection, site not specified: Secondary | ICD-10-CM

## 2014-10-09 DIAGNOSIS — Z0000 Encounter for general adult medical examination without abnormal findings: Secondary | ICD-10-CM | POA: Diagnosis not present

## 2014-10-09 LAB — POCT URINALYSIS DIPSTICK
BILIRUBIN UA: NEGATIVE
Blood, UA: NEGATIVE
GLUCOSE UA: NEGATIVE
KETONES UA: NEGATIVE
Nitrite, UA: NEGATIVE
Protein, UA: NEGATIVE
Spec Grav, UA: 1.01
Urobilinogen, UA: 0.2
pH, UA: 6

## 2014-10-09 MED ORDER — SULFAMETHOXAZOLE-TRIMETHOPRIM 800-160 MG PO TABS: 1.0000 | ORAL_TABLET | Freq: Two times a day (BID) | ORAL | Status: DC

## 2014-10-09 NOTE — Progress Notes (Signed)
   Subjective:    Patient ID: Megan Crane, female    DOB: [birth year], 64 y.o.   MRN: [id number]  HPI Here for several issues. First for the past week she has felt bad with weakness, some light diarrhea and some urinary urgency. No pain or burning. Also she asks me to set up a colonoscopy. She had a normal one in 2000.    Review of Systems  Constitutional: Negative.   Respiratory: Negative.   Cardiovascular: Negative.   Gastrointestinal: Positive for diarrhea. Negative for nausea, vomiting, abdominal pain, constipation, blood in stool, abdominal distention, anal bleeding and rectal pain.  Genitourinary: Positive for frequency. Negative for dysuria and flank pain.       Objective:   Physical Exam  Constitutional: She appears well-developed and well-nourished. She appears distressed.  Cardiovascular: Normal rate, regular rhythm, normal heart sounds and intact distal pulses.   Pulmonary/Chest: Effort normal and breath sounds normal.  Abdominal: Soft. Bowel sounds are normal. She exhibits no distension and no mass. There is no tenderness. There is no rebound and no guarding.          Assessment & Plan:  Treat the UTI with Bactrim DS. Drink plenty of water. Culture the sample. Set up a colonoscopy.

## 2014-10-09 NOTE — Progress Notes (Signed)
Pre visit review using our clinic review tool, if applicable. No additional management support is needed unless otherwise documented below in the visit note. 

## 2014-10-13 LAB — URINE CULTURE: Colony Count: >=100000

## 2014-12-22 ENCOUNTER — Telehealth (INDEPENDENT_AMBULATORY_CARE_PROVIDER_SITE_OTHER): Payer: Managed Care, Other (non HMO) | Admitting: Family Medicine

## 2014-12-22 ENCOUNTER — Ambulatory Visit (INDEPENDENT_AMBULATORY_CARE_PROVIDER_SITE_OTHER): Payer: Managed Care, Other (non HMO) | Admitting: Family Medicine

## 2014-12-22 DIAGNOSIS — Z23 Encounter for immunization: Secondary | ICD-10-CM | POA: Diagnosis not present

## 2014-12-22 DIAGNOSIS — R350 Frequency of micturition: Secondary | ICD-10-CM | POA: Diagnosis not present

## 2014-12-22 LAB — POCT URINALYSIS DIPSTICK
Bilirubin, UA: NEGATIVE
Glucose, UA: NEGATIVE
Ketones, UA: NEGATIVE
Leukocytes, UA: NEGATIVE
Nitrite, UA: NEGATIVE
Protein, UA: NEGATIVE
RBC UA: NEGATIVE
SPEC GRAV UA: 1.02
UROBILINOGEN UA: 0.2
pH, UA: 6

## 2014-12-22 NOTE — Telephone Encounter (Signed)
Pt was in for flu vaccine and wanted to give a urine sample due to frequent urination. Per Dr. Sarajane Jews okay to get sample. Results were clear and I spoke with pt.

## 2014-12-30 ENCOUNTER — Telehealth: Payer: Self-pay | Admitting: Family Medicine

## 2014-12-30 NOTE — Telephone Encounter (Signed)
Ms. Kaplin called saying she's began having a burning sensation when she urinates and itching in her pubic area. She thinks she has a UTI but would like a phone call regarding this. She's began drinking buttermilk and eating yogurt.  Pt's ph# [phone] Thank you.

## 2014-12-31 ENCOUNTER — Other Ambulatory Visit: Payer: Self-pay | Admitting: Family Medicine

## 2014-12-31 ENCOUNTER — Other Ambulatory Visit (INDEPENDENT_AMBULATORY_CARE_PROVIDER_SITE_OTHER): Payer: Managed Care, Other (non HMO)

## 2014-12-31 DIAGNOSIS — R309 Painful micturition, unspecified: Secondary | ICD-10-CM

## 2014-12-31 LAB — URINALYSIS, ROUTINE W REFLEX MICROSCOPIC
Bilirubin Urine: NEGATIVE
Hgb urine dipstick: NEGATIVE
Ketones, ur: NEGATIVE
Nitrite: NEGATIVE
Specific Gravity, Urine: 1.02 (ref 1.000–1.030)
Total Protein, Urine: NEGATIVE
Urine Glucose: NEGATIVE
Urobilinogen, UA: 0.2 (ref 0.0–1.0)
pH: 5.5 (ref 5.0–8.0)

## 2014-12-31 NOTE — Telephone Encounter (Signed)
Per Dr. Sarajane Jews order a UA and I did put in future order. I spoke with pt and she will stop by office today and give sample.

## 2015-01-01 MED ORDER — NITROFURANTOIN MONOHYD MACRO 100 MG PO CAPS: 100.0000 mg | ORAL_CAPSULE | Freq: Two times a day (BID) | ORAL | Status: DC

## 2015-01-21 ENCOUNTER — Telehealth: Payer: Self-pay | Admitting: Family Medicine

## 2015-01-21 NOTE — Telephone Encounter (Signed)
[facility] - Client [place] [facility] Patient Name: Megan Crane DOB: 06-09-1950 Initial Comment Caller states husband brought viral thing home to her; no fever; what can take? runny nose, began as scratching in ears and throat; occasional cough; taking coricidian and tylenol; on BP meds; Nurse Assessment Nurse: Markus Daft, RN, [place] Date/Time (Eastern Time): 01/21/2015 12:05:25 PM Confirm and document reason for call. If symptomatic, describe symptoms. ---Caller states she has runny nose which began as scratching in ears and throat, and occasional productive cough - minimal amt of phlegm. She does not have a lot of drainage. Chills last night. No fever. -- Her husband had a cold, and he has improved but she has not. She is taking Coricidin HBP, and Tylenol. Her s/s started Tuesday and worse yesterday. Has the patient traveled out of the country within the last 30 days? ---Not Applicable Does the patient have any new or worsening symptoms? ---Yes Will a triage be completed? ---Yes Related visit to physician within the last 2 weeks? ---No Does the PT have any chronic conditions? (i.e. diabetes, asthma, etc.) ---Yes List chronic conditions. ---HTN Is this a behavioral health or substance abuse call? ---No Guidelines Guideline Title Affirmed Question Affirmed Notes Common Cold Cold with no complications (all triage questions negative) Final Disposition [place], RN, [place] Disagree/Comply: Comply

## 2015-02-09 ENCOUNTER — Telehealth: Payer: Self-pay | Admitting: Family Medicine

## 2015-02-09 ENCOUNTER — Telehealth: Payer: Self-pay

## 2015-02-09 MED ORDER — OLMESARTAN MEDOXOMIL-HCTZ 40-25 MG PO TABS: 1.0000 | ORAL_TABLET | Freq: Every day | ORAL | Status: DC

## 2015-02-09 NOTE — Telephone Encounter (Signed)
Prior Authorization for Benicar submited. Will await determination.

## 2015-02-09 NOTE — Telephone Encounter (Signed)
I sent script e-scribe and spoke with pt. 

## 2015-02-09 NOTE — Telephone Encounter (Signed)
Patient would like for RN to call her pertaining her Benicar HCT 25mg . She took her last one this morning and needs prescription refilled.

## 2015-02-12 ENCOUNTER — Other Ambulatory Visit: Payer: Self-pay

## 2015-02-12 MED ORDER — HYDROCHLOROTHIAZIDE 25 MG PO TABS: 25.0000 mg | ORAL_TABLET | Freq: Every day | ORAL | Status: DC

## 2015-02-12 MED ORDER — OLMESARTAN MEDOXOMIL 40 MG PO TABS: 40.0000 mg | ORAL_TABLET | Freq: Every day | ORAL | Status: DC

## 2015-02-12 NOTE — Telephone Encounter (Signed)
[employer] sent fax stating they denied prior authorization for olmesartan-hydrochlorothiazide (BENICAR HCT) 40-25 MG tablet stating:  There is no indication patient has documented intolerance to 1 generic formulation of Benicar HCT and there is no indication patient has documented inability to use olmesartan and hydrochlorothiazide concurrently.  Unless otherwise stated all covered alternative drugs are required prior to the approval of the non-covered drug or biologic.

## 2015-02-12 NOTE — Telephone Encounter (Signed)
We will need to prescribe these separately, so call in Benicar 40 mg daily and HCTZ 25 mg daily, one year of each.

## 2015-02-12 NOTE — Telephone Encounter (Signed)
Patient returned phone call - I notified her of change in Rx. Patient verbalized understanding.

## 2015-02-12 NOTE — Telephone Encounter (Signed)
Left message for patient to return phone call - sent in medications for patient. Thanks!

## 2015-02-18 ENCOUNTER — Other Ambulatory Visit: Payer: Self-pay

## 2015-02-18 MED ORDER — BENICAR 40 MG PO TABS: 40.0000 mg | ORAL_TABLET | Freq: Every day | ORAL | Status: DC

## 2015-03-05 ENCOUNTER — Ambulatory Visit: Payer: Managed Care, Other (non HMO) | Admitting: Family Medicine

## 2015-03-11 ENCOUNTER — Encounter: Payer: Self-pay | Admitting: Family Medicine

## 2015-03-11 ENCOUNTER — Ambulatory Visit (INDEPENDENT_AMBULATORY_CARE_PROVIDER_SITE_OTHER): Payer: Managed Care, Other (non HMO) | Admitting: Family Medicine

## 2015-03-11 VITALS — BP 115/72 | HR 81 | Temp 99.0°F | Ht 65.0 in | Wt 214.0 lb

## 2015-03-11 DIAGNOSIS — R32 Unspecified urinary incontinence: Secondary | ICD-10-CM | POA: Diagnosis not present

## 2015-03-11 DIAGNOSIS — J302 Other seasonal allergic rhinitis: Secondary | ICD-10-CM

## 2015-03-11 DIAGNOSIS — Z87440 Personal history of urinary (tract) infections: Secondary | ICD-10-CM | POA: Diagnosis not present

## 2015-03-11 DIAGNOSIS — I10 Essential (primary) hypertension: Secondary | ICD-10-CM

## 2015-03-11 LAB — POC URINALSYSI DIPSTICK (AUTOMATED)
Bilirubin, UA: NEGATIVE
Glucose, UA: NEGATIVE
Ketones, UA: NEGATIVE
NITRITE UA: NEGATIVE
PH UA: 5.5
Protein, UA: NEGATIVE
RBC UA: NEGATIVE
Spec Grav, UA: 1.015
UROBILINOGEN UA: 0.2

## 2015-03-11 NOTE — Progress Notes (Signed)
   Subjective:    Patient ID: Megan Crane, female    DOB: 1950-02-09, 65 y.o.   MRN: [id number]  HPI Here to discuss several issues. First she wants to check a urine sample since she recently got over a UTI. She has not symptoms. Second she still has trouble with urinary urgency and occasional incontinence. She has been prescribed Vesicare but she has never used it consistently. Third she wants to check her BP. Fourth she asks about several weeksof stuffy head, runny nose, sneezing, and PND. No fever or cough.   Review of Systems  Constitutional: Negative.   HENT: Positive for congestion, postnasal drip, rhinorrhea and sneezing. Negative for ear pain, sinus pressure and sore throat.   Eyes: Negative.   Respiratory: Negative.   Genitourinary: Positive for urgency. Negative for dysuria, frequency and flank pain.       Objective:   Physical Exam  Constitutional: She is oriented to person, place, and time. She appears well-developed and well-nourished.  HENT:  Right Ear: External ear normal.  Left Ear: External ear normal.  Nose: Nose normal.  Mouth/Throat: Oropharynx is clear and moist.  Eyes: Conjunctivae are normal.  Neck: No thyromegaly present.  Cardiovascular: Normal rate, regular rhythm, normal heart sounds and intact distal pulses.   Pulmonary/Chest: Effort normal and breath sounds normal.  Abdominal: Soft. Bowel sounds are normal. She exhibits no distension and no mass. There is no tenderness. There is no rebound and no guarding.  Lymphadenopathy:    She has no cervical adenopathy.  Neurological: She is alert and oriented to person, place, and time.          Assessment & Plan:  She has seasonal allergies so she can try Claritin daily. Her UA is clear. For stress incontinence try Vesicare 5 mg daily. Her HTN is stable.

## 2015-03-11 NOTE — Progress Notes (Signed)
Pre visit review using our clinic review tool, if applicable. No additional management support is needed unless otherwise documented below in the visit note. 

## 2015-04-30 ENCOUNTER — Telehealth: Payer: Self-pay | Admitting: Family Medicine

## 2015-04-30 NOTE — Telephone Encounter (Signed)
FYI

## 2015-04-30 NOTE — Telephone Encounter (Signed)
[facility]  Patient Name: Megan Crane  DOB: July 14, 1950    Initial Comment Caller states they have been having lower abdominal pain.    Nurse Assessment  Nurse: Genoveva Ill, RN, Lattie Haw Date/Time (Eastern Time): 04/30/2015 4:48:02 PM  Confirm and document reason for call. If symptomatic, describe symptoms. You must click the next button to save text entered. ---Caller states is having intermittent lower abdominal cramping, since Wednesday and is bloated  Has the patient traveled out of the country within the last 30 days? ---Not Applicable  Does the patient have any new or worsening symptoms? ---Yes  Will a triage be completed? ---Yes  Related visit to physician within the last 2 weeks? ---No  Does the PT have any chronic conditions? (i.e. diabetes, asthma, etc.) ---Yes  List chronic conditions. ---high cholesterol, UTI's, HTN  Is this a behavioral health or substance abuse call? ---No     Guidelines    Guideline Title Affirmed Question Affirmed Notes  Abdominal Pain - Female [1] MODERATE (e.g., interferes with normal activities) AND [2] pain comes and goes (cramps) AND [3] present > 24 hours (Exception: pain with Vomiting or Diarrhea - see that Guideline)    Final Disposition User   See Physician within 24 Hours [place], RN, Lattie Haw    Comments  caller states she does not desire to be seen at this time, but if gets worse, will go to ER; recommendation reinforced   Referrals  GO TO FACILITY REFUSED   Disagree/Comply: Disagree  Disagree/Comply Reason: Disagree with instructions

## 2015-05-05 ENCOUNTER — Ambulatory Visit: Payer: Managed Care, Other (non HMO) | Admitting: Family Medicine

## 2015-05-06 ENCOUNTER — Ambulatory Visit: Payer: Managed Care, Other (non HMO) | Admitting: Family Medicine

## 2015-06-17 ENCOUNTER — Encounter: Payer: Self-pay | Admitting: Family Medicine

## 2015-06-17 ENCOUNTER — Ambulatory Visit (INDEPENDENT_AMBULATORY_CARE_PROVIDER_SITE_OTHER): Payer: Managed Care, Other (non HMO) | Admitting: Family Medicine

## 2015-06-17 VITALS — BP 116/84 | HR 85 | Temp 97.9°F | Ht 65.0 in | Wt 218.2 lb

## 2015-06-17 DIAGNOSIS — R079 Chest pain, unspecified: Secondary | ICD-10-CM | POA: Diagnosis not present

## 2015-06-17 DIAGNOSIS — R202 Paresthesia of skin: Secondary | ICD-10-CM

## 2015-06-17 DIAGNOSIS — R200 Anesthesia of skin: Secondary | ICD-10-CM

## 2015-06-17 LAB — BASIC METABOLIC PANEL
BUN: 27 mg/dL — ABNORMAL HIGH (ref 6–23)
CALCIUM: 10.1 mg/dL (ref 8.4–10.5)
CO2: 31 mEq/L (ref 19–32)
Chloride: 103 mEq/L (ref 96–112)
Creatinine, Ser: 0.88 mg/dL (ref 0.40–1.20)
GFR: 69.05 mL/min (ref >60.00)
GLUCOSE: 91 mg/dL (ref 70–99)
POTASSIUM: 4.2 meq/L (ref 3.5–5.1)
Sodium: 139 mEq/L (ref 135–145)

## 2015-06-17 LAB — CBC WITH DIFFERENTIAL/PLATELET
Basophils Absolute: 0 10*3/uL (ref 0.0–0.1)
Basophils Relative: 0.7 % (ref 0.0–3.0)
EOS PCT: 1.4 % (ref 0.0–5.0)
Eosinophils Absolute: 0.1 10*3/uL (ref 0.0–0.7)
HEMATOCRIT: 37.1 % (ref 36.0–46.0)
HEMOGLOBIN: 12.4 g/dL (ref 12.0–15.0)
LYMPHS PCT: 28.1 % (ref 12.0–46.0)
Lymphs Abs: 2 10*3/uL (ref 0.7–4.0)
MCHC: 33.5 g/dL (ref 30.0–36.0)
MCV: 88.9 fl (ref 78.0–100.0)
Monocytes Absolute: 0.5 10*3/uL (ref 0.1–1.0)
Monocytes Relative: 7.4 % (ref 3.0–12.0)
Neutro Abs: 4.4 10*3/uL (ref 1.4–7.7)
Neutrophils Relative %: 62.4 % (ref 43.0–77.0)
PLATELETS: 201 10*3/uL (ref 150.0–400.0)
RBC: 4.17 Mil/uL (ref 3.87–5.11)
RDW: 14 % (ref 11.5–15.5)
WBC: 7 10*3/uL (ref 4.0–10.5)

## 2015-06-17 LAB — TSH: TSH: 2.47 u[IU]/mL (ref 0.35–4.50)

## 2015-06-17 LAB — VITAMIN B12: Vitamin B-12: 444 pg/mL (ref 211–911)

## 2015-06-17 LAB — HEPATIC FUNCTION PANEL
ALT: 23 U/L (ref 0–35)
AST: 21 U/L (ref 0–37)
Albumin: 4.3 g/dL (ref 3.5–5.2)
Alkaline Phosphatase: 62 U/L (ref 39–117)
BILIRUBIN DIRECT: 0.1 mg/dL (ref 0.0–0.3)
TOTAL PROTEIN: 7 g/dL (ref 6.0–8.3)
Total Bilirubin: 0.4 mg/dL (ref 0.2–1.2)

## 2015-06-17 NOTE — Progress Notes (Signed)
Pre visit review using our clinic review tool, if applicable. No additional management support is needed unless otherwise documented below in the visit note. 

## 2015-06-17 NOTE — Progress Notes (Signed)
   Subjective:    Patient ID: Megan Crane, female    DOB: Jun 07, 1950, 65 y.o.   MRN: [id number]  HPI Here to discuss several symptoms that started about 2 months ago. First of all se describes a left sided chest pain which often associated with exertion. This is a pressure or squeezing type pain that lasts about 5 minutes. No associated SOB. Also she often experiences numbness and tingling down the left arm along with these spells of chest pain. Otherwise she has had some frequent numbness or tingling in both hands and sometimes the feet.    Review of Systems  Constitutional: Negative.   Respiratory: Positive for chest tightness. Negative for cough, shortness of breath and wheezing.   Cardiovascular: Positive for chest pain. Negative for palpitations and leg swelling.  Gastrointestinal: Negative.   Neurological: Positive for numbness. Negative for dizziness, tremors, seizures, syncope, facial asymmetry, speech difficulty, weakness, light-headedness and headaches.       Objective:   Physical Exam  Constitutional: She is oriented to person, place, and time. She appears well-developed and well-nourished. No distress.  Neck: No thyromegaly present.  Cardiovascular: Normal rate, regular rhythm, normal heart sounds and intact distal pulses.   Pulmonary/Chest: Effort normal and breath sounds normal. No respiratory distress. She has no wheezes. She has no rales.  Lymphadenopathy:    She has no cervical adenopathy.  Neurological: She is alert and oriented to person, place, and time. No cranial nerve deficit. She exhibits normal muscle tone. Coordination normal.          Assessment & Plan:  She has some extremity numbness and we will screen with some labs here today. She is also having spells of chest pain with exertion, and we will set up a stress test for her soon.  Laurey Morale, MD

## 2015-06-18 ENCOUNTER — Telehealth: Payer: Self-pay

## 2015-06-18 NOTE — Telephone Encounter (Signed)
Called patient. Gave lab results. Patient verbalized understanding.  

## 2015-06-18 NOTE — Telephone Encounter (Signed)
-----   Message from Laurey Morale, MD sent at 06/18/2015  8:53 AM EDT ----- Normal

## 2015-07-05 ENCOUNTER — Telehealth (HOSPITAL_COMMUNITY): Payer: Self-pay | Admitting: *Deleted

## 2015-07-05 NOTE — Telephone Encounter (Signed)
Left message on voicemail in reference to upcoming appointment scheduled for 07/07/15. Phone number given for a call back so details instructions can be given. Hubbard Robinson, RN

## 2015-07-06 ENCOUNTER — Telehealth (HOSPITAL_COMMUNITY): Payer: Self-pay | Admitting: Radiology

## 2015-07-06 NOTE — Telephone Encounter (Signed)
Patient given detailed instructions per Myocardial Perfusion Study Information Sheet for the test on 07/07/15 at 9:45. Patient notified to arrive 15 minutes early and that it is imperative to arrive on time for appointment to keep from having the test rescheduled.  If you need to cancel or reschedule your appointment, please call the office within 24 hours of your appointment. Failure to do so may result in a cancellation of your appointment, and a $50 no show fee. Patient verbalized understanding.Megan Crane

## 2015-07-07 ENCOUNTER — Ambulatory Visit (HOSPITAL_COMMUNITY): Payer: Managed Care, Other (non HMO) | Attending: Cardiology

## 2015-07-07 DIAGNOSIS — R202 Paresthesia of skin: Secondary | ICD-10-CM | POA: Insufficient documentation

## 2015-07-07 DIAGNOSIS — R200 Anesthesia of skin: Secondary | ICD-10-CM

## 2015-07-07 DIAGNOSIS — I10 Essential (primary) hypertension: Secondary | ICD-10-CM | POA: Diagnosis not present

## 2015-07-07 DIAGNOSIS — R079 Chest pain, unspecified: Secondary | ICD-10-CM | POA: Diagnosis not present

## 2015-07-07 LAB — MYOCARDIAL PERFUSION IMAGING
CHL CUP NUCLEAR SSS: 3
CSEPED: 6 min
CSEPHR: 106 %
Estimated workload: 4.6 METS
Exercise duration (sec): 0 s
LV dias vol: 74 mL (ref 46–106)
LVSYSVOL: 24 mL
MPHR: 158 {beats}/min
Peak HR: 169 {beats}/min
RATE: 0.23
Rest HR: 71 {beats}/min
SDS: 0
SRS: 3
TID: 0.75

## 2015-07-07 MED ORDER — TECHNETIUM TC 99M TETROFOSMIN IV KIT
10.6000 | PACK | Freq: Once | INTRAVENOUS | Status: AC | PRN
  Administered 2015-07-07: 11 via INTRAVENOUS
  Filled 2015-07-07: qty 11

## 2015-07-07 MED ORDER — TECHNETIUM TC 99M TETROFOSMIN IV KIT
32.6000 | PACK | Freq: Once | INTRAVENOUS | Status: AC | PRN
  Administered 2015-07-07: 33 via INTRAVENOUS
  Filled 2015-07-07: qty 33

## 2015-07-28 ENCOUNTER — Encounter: Payer: Self-pay | Admitting: Family Medicine

## 2015-07-28 ENCOUNTER — Ambulatory Visit (INDEPENDENT_AMBULATORY_CARE_PROVIDER_SITE_OTHER): Payer: Managed Care, Other (non HMO) | Admitting: Family Medicine

## 2015-07-28 DIAGNOSIS — I10 Essential (primary) hypertension: Secondary | ICD-10-CM

## 2015-07-28 NOTE — Progress Notes (Signed)
   Subjective:    Patient ID: Megan Crane, female    DOB: October 30, 1950, 65 y.o.   MRN: [id number]  HPI Here to discuss her recent stress test and to ask for advice. Her stress test was totally normal and she feels great. She wants to lose weight and asks about dietary advice. She has already set up a program with a personal trainer to work out one hour 3 days a week.    Review of Systems  Constitutional: Negative.   Respiratory: Negative.   Cardiovascular: Negative.   Neurological: Negative.        Objective:   Physical Exam  Constitutional: She is oriented to person, place, and time. She appears well-developed and well-nourished.  Cardiovascular: Normal rate, regular rhythm, normal heart sounds and intact distal pulses.   Pulmonary/Chest: Effort normal and breath sounds normal.  Neurological: She is alert and oriented to person, place, and time.          Assessment & Plan:  Her CV status is quite healthy so se is free to pursue any exercise regimen she cares to. I suggested she follow the [place]. Recheck prn Laurey Morale, MD

## 2015-07-28 NOTE — Progress Notes (Signed)
Pre visit review using our clinic review tool, if applicable. No additional management support is needed unless otherwise documented below in the visit note. 

## 2015-09-24 ENCOUNTER — Telehealth: Payer: Self-pay | Admitting: Family Medicine

## 2015-09-24 NOTE — Telephone Encounter (Signed)
I spoke with pt and she is now having diarrhea, no fever. Per Dr. Sarajane Jews drink plenty of fluids and she can follow up with [place] as needed, pt agreed. She is able to keep down fluids and had some soup today.

## 2015-09-24 NOTE — Telephone Encounter (Signed)
Megan Crane pt would like to have a call concerning her being constipated and having terrible pain in her stomach.  Pt refused [facility].

## 2015-09-29 ENCOUNTER — Encounter: Payer: Self-pay | Admitting: Family Medicine

## 2015-09-29 ENCOUNTER — Ambulatory Visit (INDEPENDENT_AMBULATORY_CARE_PROVIDER_SITE_OTHER): Payer: Managed Care, Other (non HMO) | Admitting: Family Medicine

## 2015-09-29 VITALS — BP 110/69 | HR 78 | Temp 98.3°F | Ht 65.0 in | Wt 219.0 lb

## 2015-09-29 DIAGNOSIS — Z23 Encounter for immunization: Secondary | ICD-10-CM | POA: Diagnosis not present

## 2015-09-29 DIAGNOSIS — R109 Unspecified abdominal pain: Secondary | ICD-10-CM

## 2015-09-29 DIAGNOSIS — A084 Viral intestinal infection, unspecified: Secondary | ICD-10-CM

## 2015-09-29 LAB — POC URINALSYSI DIPSTICK (AUTOMATED)
BILIRUBIN UA: NEGATIVE
Blood, UA: NEGATIVE
GLUCOSE UA: NEGATIVE
KETONES UA: NEGATIVE
LEUKOCYTES UA: NEGATIVE
Nitrite, UA: NEGATIVE
Protein, UA: NEGATIVE
Spec Grav, UA: >=1.03
Urobilinogen, UA: 0.2
pH, UA: 6

## 2015-09-29 NOTE — Progress Notes (Signed)
   Subjective:    Patient ID: Megan Crane, female    DOB: [birth year], 65 y.o.   MRN: [id number]  HPI Here to follow up on 4 days of lower abdominal cramps and diarrhea. No fever, no nausea or vomiting. No UTI symptoms. Today she feels much better and has been eating and drinking normally.    Review of Systems  Constitutional: Negative.   Respiratory: Negative.   Cardiovascular: Negative.   Gastrointestinal: Positive for abdominal pain and diarrhea. Negative for abdominal distention, anal bleeding, blood in stool, constipation, nausea, rectal pain and vomiting.  Genitourinary: Negative.        Objective:   Physical Exam  Constitutional: She appears well-developed and well-nourished. No distress.  Cardiovascular: Normal rate, regular rhythm, normal heart sounds and intact distal pulses.   Pulmonary/Chest: Effort normal and breath sounds normal.  Abdominal: Soft. Bowel sounds are normal. She exhibits no distension and no mass. There is no tenderness. There is no rebound and no guarding.          Assessment & Plan:  She had a viral enteritis and it has apparently resolved. Recheck prn.  Laurey Morale, MD

## 2015-09-29 NOTE — Progress Notes (Signed)
Pre visit review using our clinic review tool, if applicable. No additional management support is needed unless otherwise documented below in the visit note. 

## 2015-09-29 NOTE — Addendum Note (Signed)
Addended by: Aggie Hacker A on: 09/29/2015 04:52 PM   Modules accepted: Orders

## 2015-10-04 ENCOUNTER — Telehealth: Payer: Self-pay | Admitting: Family Medicine

## 2015-10-04 DIAGNOSIS — Z0000 Encounter for general adult medical examination without abnormal findings: Secondary | ICD-10-CM

## 2015-10-04 NOTE — Telephone Encounter (Signed)
Pt following up on colonoscopy referral. Pt had last one at [facility], but now has no preference. Pt would like a call back.

## 2015-10-05 NOTE — Telephone Encounter (Signed)
I put in a referral to [facility] for the colonoscopy

## 2015-10-05 NOTE — Telephone Encounter (Signed)
I left a voice message for pt with below information.  

## 2015-10-06 ENCOUNTER — Encounter: Payer: Self-pay | Admitting: Gastroenterology

## 2015-10-26 ENCOUNTER — Other Ambulatory Visit: Payer: Self-pay

## 2015-12-10 ENCOUNTER — Encounter: Payer: Managed Care, Other (non HMO) | Admitting: Gastroenterology

## 2015-12-22 ENCOUNTER — Ambulatory Visit (AMBULATORY_SURGERY_CENTER): Payer: Managed Care, Other (non HMO)

## 2015-12-22 ENCOUNTER — Encounter: Payer: Self-pay | Admitting: Gastroenterology

## 2015-12-22 VITALS — Ht 66.0 in | Wt 220.4 lb

## 2015-12-22 DIAGNOSIS — Z1211 Encounter for screening for malignant neoplasm of colon: Secondary | ICD-10-CM

## 2015-12-22 MED ORDER — SUPREP BOWEL PREP KIT 17.5-3.13-1.6 GM/177ML PO SOLN: 1.0000 | Freq: Once | ORAL | 0 refills | Status: AC

## 2015-12-22 NOTE — Progress Notes (Signed)
Major rectocele construction 3 yrs ago  No allergies to eggs or soy No past problems with anesthesia No diet meds No home oxygen  Declined emmi

## 2015-12-28 ENCOUNTER — Ambulatory Visit (AMBULATORY_SURGERY_CENTER): Payer: Managed Care, Other (non HMO) | Admitting: Gastroenterology

## 2015-12-28 ENCOUNTER — Encounter: Payer: Self-pay | Admitting: Gastroenterology

## 2015-12-28 VITALS — BP 105/65 | HR 70 | Temp 98.4°F | Resp 15 | Ht 66.0 in | Wt 220.0 lb

## 2015-12-28 DIAGNOSIS — Z800 Family history of malignant neoplasm of digestive organs: Secondary | ICD-10-CM

## 2015-12-28 DIAGNOSIS — Z1211 Encounter for screening for malignant neoplasm of colon: Secondary | ICD-10-CM | POA: Diagnosis not present

## 2015-12-28 DIAGNOSIS — D123 Benign neoplasm of transverse colon: Secondary | ICD-10-CM

## 2015-12-28 DIAGNOSIS — Z1212 Encounter for screening for malignant neoplasm of rectum: Secondary | ICD-10-CM

## 2015-12-28 DIAGNOSIS — K6389 Other specified diseases of intestine: Secondary | ICD-10-CM | POA: Diagnosis not present

## 2015-12-28 DIAGNOSIS — D127 Benign neoplasm of rectosigmoid junction: Secondary | ICD-10-CM

## 2015-12-28 HISTORY — PX: COLONOSCOPY: SHX174

## 2015-12-28 MED ORDER — SODIUM CHLORIDE 0.9 % IV SOLN: 500.0000 mL | INTRAVENOUS | Status: DC

## 2015-12-28 NOTE — Progress Notes (Signed)
Report given to PACU RN, vss 

## 2015-12-28 NOTE — Progress Notes (Signed)
Called to room to assist during endoscopic procedure.  Patient ID and intended procedure confirmed with present staff. Received instructions for my participation in the procedure from the performing physician.  

## 2015-12-28 NOTE — Progress Notes (Signed)
No problems noted in the recovery room. maw 

## 2015-12-28 NOTE — Op Note (Signed)
[place] Patient Name: Megan Crane Procedure Date: 12/28/2015 9:20 AM MRN: [id number] Endoscopist: Remo Lipps P. Armbruster MD, MD Age: 65 Referring MD:  Date of Birth: 10-Feb-1950 Gender: Female Account #: [id number] Procedure:                Colonoscopy Indications:              Screening in patient at increased risk: Family                            history of 1st-degree relative with colorectal                            cancer - mother age 62s Medicines:                Monitored Anesthesia Care Procedure:                Pre-Anesthesia Assessment:                           - Prior to the procedure, a History and Physical                            was performed, and patient medications and                            allergies were reviewed. The patient's tolerance of                            previous anesthesia was also reviewed. The risks                            and benefits of the procedure and the sedation                            options and risks were discussed with the patient.                            All questions were answered, and informed consent                            was obtained. Prior Anticoagulants: The patient has                            taken no previous anticoagulant or antiplatelet                            agents. ASA Grade Assessment: II - A patient with                            mild systemic disease. After reviewing the risks                            and benefits, the patient was deemed in  satisfactory condition to undergo the procedure.                           After obtaining informed consent, the colonoscope                            was passed under direct vision. Throughout the                            procedure, the patient's blood pressure, pulse, and                            oxygen saturations were monitored continuously. The                            Model PCF-[id number])  scope was introduced                            through the anus and advanced to the the terminal                            ileum, with identification of the appendiceal                            orifice and IC valve. The colonoscopy was performed                            without difficulty. The patient tolerated the                            procedure well. The quality of the bowel                            preparation was good. The ileocecal valve,                            appendiceal orifice, and rectum were photographed. Scope In: 9:23:23 AM Scope Out: 9:50:32 AM Scope Withdrawal Time: 0 hours 23 minutes 4 seconds  Total Procedure Duration: 0 hours 27 minutes 9 seconds  Findings:                 The perianal and digital rectal examinations were                            normal.                           A 6 mm polyp was found in the transverse colon. The                            polyp was sessile. The polyp was removed with a                            cold snare. Resection and retrieval were complete.  A 6 mm polyp was found in the recto-sigmoid colon                            adjacent to a diverticulum. The polyp was                            semi-pedunculated. The polyp was removed with a hot                            snare. Resection and retrieval were complete.                           A 8 mm polyp was found in the recto-sigmoid colon,                            with gross appearance consistent with a benign                            inflammatory polyp. The polyp was                            semi-pedunculated. The polyp was removed with a hot                            snare. Resection and retrieval were complete.                           Multiple medium-mouthed diverticula were found in                            the sigmoid colon.                           The terminal ileum appeared normal.                           The exam was  otherwise without abnormality on                            direct and retroflexion views. Complications:            No immediate complications. Estimated blood loss:                            Minimal. Estimated Blood Loss:     Estimated blood loss was minimal. Impression:               - One 6 mm polyp in the transverse colon, removed                            with a cold snare. Resected and retrieved.                           - One 6 mm polyp at the recto-sigmoid colon  adjacent to a diverticulum, removed with a hot                            snare. Resected and retrieved.                           - One 8 mm polyp at the recto-sigmoid colon,                            removed with a hot snare. Resected and retrieved.                           - Diverticulosis in the sigmoid colon.                           - The examined portion of the ileum was normal.                           - The examination was otherwise normal on direct                            and retroflexion views. Recommendation:           - Patient has a contact number available for                            emergencies. The signs and symptoms of potential                            delayed complications were discussed with the                            patient. Return to normal activities tomorrow.                            Written discharge instructions were provided to the                            patient.                           - Resume previous diet.                           - Continue present medications.                           - No ibuprofen, naproxen, or other non-steroidal                            anti-inflammatory drugs for 2 weeks after polyp                            removal.                           -  Await pathology results.                           - Repeat colonoscopy is recommended for                            surveillance. The colonoscopy date will be                             determined after pathology results from today's                            exam become available for review. Remo Lipps P. Armbruster MD, MD 12/28/2015 9:55:55 AM This report has been signed electronically.

## 2015-12-28 NOTE — Patient Instructions (Signed)
YOU HAD AN ENDOSCOPIC PROCEDURE TODAY AT [facility]:   Refer to the procedure report that was given to you for any specific questions about what was found during the examination.  If the procedure report does not answer your questions, please call your gastroenterologist to clarify.  If you requested that your care partner not be given the details of your procedure findings, then the procedure report has been included in a sealed envelope for you to review at your convenience later.  YOU SHOULD EXPECT: Some feelings of bloating in the abdomen. Passage of more gas than usual.  Walking can help get rid of the air that was put into your GI tract during the procedure and reduce the bloating. If you had a lower endoscopy (such as a colonoscopy or flexible sigmoidoscopy) you may notice spotting of blood in your stool or on the toilet paper. If you underwent a bowel prep for your procedure, you may not have a normal bowel movement for a few days.  Please Note:  You might notice some irritation and congestion in your nose or some drainage.  This is from the oxygen used during your procedure.  There is no need for concern and it should clear up in a day or so.  SYMPTOMS TO REPORT IMMEDIATELY:   Following lower endoscopy (colonoscopy or flexible sigmoidoscopy):  Excessive amounts of blood in the stool  Significant tenderness or worsening of abdominal pains  Swelling of the abdomen that is new, acute  Fever of 100F or higher   Following upper endoscopy (EGD)  Vomiting of blood or coffee ground material  New chest pain or pain under the shoulder blades  Painful or persistently difficult swallowing  New shortness of breath  Fever of 100F or higher  Black, tarry-looking stools  For urgent or emergent issues, a gastroenterologist can be reached at any hour by calling [phone].   DIET:  We do recommend a small meal at first, but then you may proceed to your regular diet.  Drink  plenty of fluids but you should avoid alcoholic beverages for 24 hours.  ACTIVITY:  You should plan to take it easy for the rest of today and you should NOT DRIVE or use heavy machinery until tomorrow (because of the sedation medicines used during the test).    FOLLOW UP: Our staff will call the number listed on your records the next business day following your procedure to check on you and address any questions or concerns that you may have regarding the information given to you following your procedure. If we do not reach you, we will leave a message.  However, if you are feeling well and you are not experiencing any problems, there is no need to return our call.  We will assume that you have returned to your regular daily activities without incident.  If any biopsies were taken you will be contacted by phone or by letter within the next 1-3 weeks.  Please call us at [phone] if you have not heard about the biopsies in 3 weeks.    SIGNATURES/CONFIDENTIALITY: You and/or your care partner have signed paperwork which will be entered into your electronic medical record.  These signatures attest to the fact that that the information above on your After Visit Summary has been reviewed and is understood.  Full responsibility of the confidentiality of this discharge information lies with you and/or your care-partner.   Handouts were given to your care partner on polyps and  diverticulosis. No aspirin, aspirin products,  ibuprofen, naproxen, advil, motrin, aleve, or other non-steroidal anti-inflammatory drugs for 14 days after polyp removal. You may resume your other current medications today. Await biopsy results. Please call if any questions or concerns.

## 2015-12-29 ENCOUNTER — Telehealth: Payer: Self-pay | Admitting: *Deleted

## 2015-12-29 NOTE — Telephone Encounter (Signed)
  Follow up Call-  Call back number 12/28/2015  Post procedure Call Back phone  # [phone]  Permission to leave phone message Yes  Some recent data might be hidden     Patient questions:  Do you have a fever, pain , or abdominal swelling? No. Pain Score  0 *  Have you tolerated food without any problems? Yes.    Have you been able to return to your normal activities? Yes.    Do you have any questions about your discharge instructions: Diet   No. Medications  No. Follow up visit  No.  Do you have questions or concerns about your Care? No.  Actions: * If pain score is 4 or above: No action needed, pain <4.

## 2016-01-05 ENCOUNTER — Encounter: Payer: Self-pay | Admitting: Gastroenterology

## 2016-01-06 ENCOUNTER — Encounter: Payer: Self-pay | Admitting: Family Medicine

## 2016-01-06 ENCOUNTER — Ambulatory Visit (INDEPENDENT_AMBULATORY_CARE_PROVIDER_SITE_OTHER): Payer: Managed Care, Other (non HMO) | Admitting: Family Medicine

## 2016-01-06 VITALS — BP 100/72 | HR 80 | Temp 98.1°F | Wt 220.6 lb

## 2016-01-06 DIAGNOSIS — R059 Cough, unspecified: Secondary | ICD-10-CM

## 2016-01-06 DIAGNOSIS — J329 Chronic sinusitis, unspecified: Secondary | ICD-10-CM

## 2016-01-06 DIAGNOSIS — R05 Cough: Secondary | ICD-10-CM | POA: Diagnosis not present

## 2016-01-06 MED ORDER — BENZONATATE 100 MG PO CAPS: 100.0000 mg | ORAL_CAPSULE | Freq: Three times a day (TID) | ORAL | 0 refills | Status: DC

## 2016-01-06 MED ORDER — AMOXICILLIN-POT CLAVULANATE 875-125 MG PO TABS: 1.0000 | ORAL_TABLET | Freq: Two times a day (BID) | ORAL | 0 refills | Status: DC

## 2016-01-06 NOTE — Patient Instructions (Signed)
Please take medication as directed and follow up if symptoms do not improve with treatment in 3 to 4 days, worsen, or you develop a fever >100.   Sinusitis, Adult Sinusitis is soreness and inflammation of your sinuses. Sinuses are hollow spaces in the bones around your face. They are located:  Around your eyes.  In the middle of your forehead.  Behind your nose.  In your cheekbones. Your sinuses and nasal passages are lined with a stringy fluid (mucus). Mucus normally drains out of your sinuses. When your nasal tissues get inflamed or swollen, the mucus can get trapped or blocked so air cannot flow through your sinuses. This lets bacteria, viruses, and funguses grow, and that leads to infection. Follow these instructions at home: Medicines  Take, use, or apply over-the-counter and prescription medicines only as told by your doctor. These may include nasal sprays.  If you were prescribed an antibiotic medicine, take it as told by your doctor. Do not stop taking the antibiotic even if you start to feel better. Hydrate and Humidify  Drink enough water to keep your pee (urine) clear or pale yellow.  Use a cool mist humidifier to keep the humidity level in your home above 50%.  Breathe in steam for 10-15 minutes, 3-4 times a day or as told by your doctor. You can do this in the bathroom while a hot shower is running.  Try not to spend time in cool or dry air. Rest  Rest as much as possible.  Sleep with your head raised (elevated).  Make sure to get enough sleep each night. General instructions  Put a warm, moist washcloth on your face 3-4 times a day or as told by your doctor. This will help with discomfort.  Wash your hands often with soap and water. If there is no soap and water, use hand sanitizer.  Do not smoke. Avoid being around people who are smoking (secondhand smoke).  Keep all follow-up visits as told by your doctor. This is important. Contact a doctor if:  You  have a fever.  Your symptoms get worse.  Your symptoms do not get better within 10 days. Get help right away if:  You have a very bad headache.  You cannot stop throwing up (vomiting).  You have pain or swelling around your face or eyes.  You have trouble seeing.  You feel confused.  Your neck is stiff.  You have trouble breathing. This information is not intended to replace advice given to you by your health care provider. Make sure you discuss any questions you have with your health care provider. Document Released: 06/14/2007 Document Revised: 08/22/2015 Document Reviewed: 10/21/2014 Elsevier Interactive Patient Education  2017 [employer].

## 2016-01-06 NOTE — Progress Notes (Signed)
Pre visit review using our clinic review tool, if applicable. No additional management support is needed unless otherwise documented below in the visit note. 

## 2016-01-06 NOTE — Progress Notes (Signed)
Subjective:    Patient ID: Megan Crane, female    DOB: Oct 21, 1950, 65 y.o.   MRN: [id number]  HPI  Ms. Minish is a 65 year old female who presents today with nasal congestion and post nasal drip that started 2 weeks ago.  Associated symptoms of "scratchy throat", nonproductive cough that does not wake patient at night. , and ear pressure R>L.  Denies fever, chills, sweats, N/V/D, tooth pain, myalgias. No recent antibiotic use. Recent sick contact exposure with visiting family. No treatments at home at this time. No alleviating or aggravating symptoms. She is a nonsmoker No history of asthma/bronchitis  Review of Systems  Constitutional: Negative for chills and fever.  HENT: Positive for congestion, postnasal drip, rhinorrhea and sore throat. Negative for sinus pain, sinus pressure and sneezing.   Respiratory: Positive for cough. Negative for shortness of breath and wheezing.   Cardiovascular: Negative for chest pain and palpitations.  Gastrointestinal: Negative for abdominal pain, diarrhea, nausea and vomiting.  Musculoskeletal: Negative for myalgias.  Skin: Negative for rash.  Neurological: Negative for dizziness and headaches.   Past Medical History:  Diagnosis Date  . Anxiety disorder    hx vasovagal responses  . Headache(784.0)   . History of kidney stones   . Hydronephrosis, right   . Hyperlipidemia   . Hypertension   . Nephrolithiasis   . Positive nasal culture for methicillin resistant Staphylococcus aureus   . Right ureteral stone   . Urinary incontinence    sees Dr. McDiarmid      Social History   Social History  . Marital status: Married    Spouse name: N/A  . Number of children: N/A  . Years of education: N/A   Occupational History  . Not on file.   Social History Main Topics  . Smoking status: Never Smoker  . Smokeless tobacco: Never Used  . Alcohol use 0.0 oz/week     Comment: occ  . Drug use: No  . Sexual activity: Not on file   Other  Topics Concern  . Not on file   Social History Narrative  . No narrative on file    Past Surgical History:  Procedure Laterality Date  . COLONOSCOPY  2000   normal   . CYSTOSCOPY  06/09/2011   Procedure: CYSTOSCOPY;  Surgeon: Reece Packer, MD;  Location: [facility];  Service: Urology;  Laterality: N/A;  . CYSTOSCOPY W/ URETERAL STENT PLACEMENT  02/03/2014   Procedure: CYSTOSCOPY WITH  RIGHT RETROGRADE PYELOGRAM/ RIGHT URETERAL STENT PLACEMENT;  Surgeon: Malka So, MD;  Location: [facility];  Service: Urology;;  . Consuela Mimes WITH BIOPSY  02/03/2014   Procedure: CYSTOSCOPY WITH BIOPSY;  Surgeon: Malka So, MD;  Location: [facility];  Service: Urology;;  . PILONIDAL CYST EXCISION  1974  . RECTOCELE REPAIR  06/09/2011   Procedure: POSTERIOR REPAIR (RECTOCELE);  Surgeon: Reece Packer, MD;  Location: [facility];  Service: Urology;  Laterality: N/A;  Xenform graft 6x10  . RIGHT URETEROSCOPIC STONE EXTRACTION / STENT PLACEMENT  03-13-2000  . URETEROSCOPY Right 02/03/2014   Procedure: URETEROSCOPY WITH MANAGEMENT OF URETERAL STRICTURE;  Surgeon: Malka So, MD;  Location[facility];  Service: Urology;  Laterality: Right;  Marland Kitchen VAGINAL HYSTERECTOMY  06/09/2011   Procedure: HYSTERECTOMY VAGINAL;  Surgeon: Sharene Butters, MD;  Location: [facility];  Service: Gynecology;  Laterality: N/A;  . VAGINAL PROLAPSE REPAIR  06/09/2011   Procedure: VAGINAL VAULT SUSPENSION;  Surgeon:  Reece Packer, MD;  Location: [facility];  Service: Urology;  Laterality: N/A;  . VEIN LIGATION AND STRIPPING     LEFT LEG    Family History  Problem Relation Age of Onset  . Cancer Father     kidney, bladder   . Coronary artery disease Other   . Diabetes Other   . Hyperlipidemia Other   . Kidney disease Other   . Cancer Other     lung  . Colon cancer Mother 6    Allergies  Allergen Reactions  . Levofloxacin Itching and Other (See Comments)    disoriented  .  Percodan [Oxycodone-Aspirin] Other (See Comments)    "VERY DIZZY"  . Xylocaine [Lidocaine] Other (See Comments)    "Eyes crossed"    Current Outpatient Prescriptions on File Prior to Visit  Medication Sig Dispense Refill  . BENICAR 40 MG tablet Take 1 tablet (40 mg total) by mouth daily. 90 tablet 3  . hydrochlorothiazide (HYDRODIURIL) 25 MG tablet Take 1 tablet (25 mg total) by mouth daily. 90 tablet 3  . Multiple Vitamins-Minerals (CENTRUM SILVER) CHEW Chew by mouth daily.    . nitrofurantoin (MACRODANTIN) 50 MG capsule Take 50 mg by mouth at bedtime.    . furosemide (LASIX) 20 MG tablet Take 1 tablet (20 mg total) by mouth daily as needed for fluid. 90 tablet 3  . [DISCONTINUED] solifenacin (VESICARE) 10 MG tablet Take 1 tablet (10 mg total) by mouth daily. 14 tablet 0   Current Facility-Administered Medications on File Prior to Visit  Medication Dose Route Frequency Provider Last Rate Last Dose  . 0.9 %  sodium chloride infusion  500 mL Intravenous Continuous Manus Gunning, MD        BP 100/72 (BP Location: Left Arm, Patient Position: Sitting, Cuff Size: Normal)   Pulse 80   Temp 98.1 F (36.7 C) (Oral)   Wt 220 lb 9.6 oz (100.1 kg)   SpO2 98%   BMI 35.61 kg/m      Objective:   Physical Exam  Constitutional: She is oriented to person, place, and time. She appears well-developed and well-nourished.  HENT:  Left Ear: Tympanic membrane normal.  Nose: Rhinorrhea present. Right sinus exhibits frontal sinus tenderness. Left sinus exhibits maxillary sinus tenderness and frontal sinus tenderness.  Mouth/Throat: Mucous membranes are normal. No oropharyngeal exudate or posterior oropharyngeal erythema.  Dull right TM  Eyes: Pupils are equal, round, and reactive to light. No scleral icterus.  Neck: Neck supple.  Cardiovascular: Normal rate and regular rhythm.   Pulmonary/Chest: Effort normal and breath sounds normal. She has no wheezes. She has no rales.  Abdominal: Soft.  Bowel sounds are normal. There is no tenderness.  Lymphadenopathy:    She has no cervical adenopathy.  Neurological: She is alert and oriented to person, place, and time. Coordination normal.  Skin: Skin is warm and dry. No rash noted.        Assessment & Plan:  1. Sinusitis, unspecified chronicity, unspecified location Duration of symptoms; sinus tenderness with palpation, and symptoms who have not improved; will treat with Augmentin. Advised patient to follow up if symptoms do not improve in 3 to 4 days, worsen,or she develops a fever >101.   - amoxicillin-clavulanate (AUGMENTIN) 875-125 MG tablet; Take 1 tablet by mouth 2 (two) times daily.  Dispense: 20 tablet; Refill: 0  2. Cough  - benzonatate (TESSALON) 100 MG capsule; Take 1 capsule (100 mg total) by mouth 3 (three) times daily.  Dispense:  20 capsule; Refill: 0  Delano Metz, FNP-C

## 2016-02-03 ENCOUNTER — Ambulatory Visit (INDEPENDENT_AMBULATORY_CARE_PROVIDER_SITE_OTHER): Payer: Managed Care, Other (non HMO) | Admitting: Family Medicine

## 2016-02-03 ENCOUNTER — Encounter: Payer: Self-pay | Admitting: Family Medicine

## 2016-02-03 VITALS — BP 122/79 | HR 81 | Temp 99.0°F | Wt 218.0 lb

## 2016-02-03 DIAGNOSIS — G5603 Carpal tunnel syndrome, bilateral upper limbs: Secondary | ICD-10-CM

## 2016-02-03 MED ORDER — MELOXICAM 15 MG PO TABS: 15.0000 mg | ORAL_TABLET | Freq: Every day | ORAL | 0 refills | Status: DC

## 2016-02-03 NOTE — Progress Notes (Signed)
   Subjective:    Patient ID: Megan Crane, female    DOB: 10/18/1950, 66 y.o.   MRN: [id number]  HPI Here for 6 months of intermittent numbness, tingling, and pain in both hands. These sensations sometimes travel up both forearms. They can involve all fingers but seem to target the 2nd-4th fingers o each hand the most. No weakness. No neck pain. This often bothers her if she is talking on her cell phone for long periods of time or when she is driving. Sometimes this wakes her up from sleep.    Review of Systems  Constitutional: Negative.   Respiratory: Negative.   Cardiovascular: Negative.   Neurological: Positive for numbness. Negative for dizziness, tremors, seizures, syncope, facial asymmetry, speech difficulty, weakness, light-headedness and headaches.       Objective:   Physical Exam  Constitutional: She is oriented to person, place, and time. She appears well-developed and well-nourished.  Cardiovascular: Normal rate, regular rhythm, normal heart sounds and intact distal pulses.   Pulmonary/Chest: Effort normal and breath sounds normal.  Musculoskeletal: Normal range of motion. She exhibits no tenderness.  Very slight swelling in both hands  Neurological: She is alert and oriented to person, place, and time. No cranial nerve deficit. She exhibits normal muscle tone. Coordination normal.          Assessment & Plan:  This is consistent with carpal tunnel syndrome. She will purchase a pair of wrist splints and I encouraged her to wear them 24 hours a day as much as possible for the next month. Start on Meloxicam daily. Take a B complex vitamin daily. Recheck one month.  Alysia Penna, MD

## 2016-02-09 ENCOUNTER — Other Ambulatory Visit: Payer: Self-pay | Admitting: Family Medicine

## 2016-03-12 ENCOUNTER — Other Ambulatory Visit: Payer: Self-pay | Admitting: Family Medicine

## 2016-03-14 ENCOUNTER — Telehealth: Payer: Self-pay

## 2016-03-14 NOTE — Telephone Encounter (Signed)
Received PA request from Kristopher Oppenheim for [facility]. PA submitted & is pending. [id number]

## 2016-03-16 ENCOUNTER — Telehealth: Payer: Self-pay | Admitting: Family Medicine

## 2016-03-16 NOTE — Telephone Encounter (Signed)
pharmacist is calling to see if generic for [facility] is okay for the pt.  The pts insurance will no longer cover the brand name so they would like to have a new Rx for the generic of the Rx.

## 2016-03-16 NOTE — Telephone Encounter (Signed)
Pharmacy called to let us know that PA has been denied, and patient has tried the generic and it did not work. Advised that we would file the appeal once it comes to [place].

## 2016-03-21 NOTE — Telephone Encounter (Signed)
PA approved for brand name. Fax sent back to pharmacy.

## 2016-04-27 ENCOUNTER — Other Ambulatory Visit: Payer: Self-pay | Admitting: Family Medicine

## 2016-05-22 ENCOUNTER — Telehealth: Payer: Self-pay | Admitting: Family Medicine

## 2016-05-22 NOTE — Telephone Encounter (Signed)
° ° °  Pt call to say Dr Sarajane Jews has wrote her a rx for some ointment to put in her nose when it gets dry    Pharmacy [facility] [place]

## 2016-05-23 MED ORDER — MUPIROCIN 2 % EX OINT: 1.0000 "application " | TOPICAL_OINTMENT | Freq: Two times a day (BID) | CUTANEOUS | 2 refills | Status: DC | PRN

## 2016-05-23 NOTE — Telephone Encounter (Signed)
Call in Bactroban 2% ointment to apply bid prn, 30 grams with 2 rf

## 2016-05-23 NOTE — Telephone Encounter (Signed)
Rx done and I left a message for the pt to return my call. 

## 2016-06-15 ENCOUNTER — Telehealth: Payer: Self-pay | Admitting: Family Medicine

## 2016-06-15 NOTE — Telephone Encounter (Signed)
[employer] insurance needs a verbal authorization from the MD so the copay will lower.   [phone]

## 2016-06-16 ENCOUNTER — Ambulatory Visit (INDEPENDENT_AMBULATORY_CARE_PROVIDER_SITE_OTHER): Payer: Managed Care, Other (non HMO) | Admitting: Family Medicine

## 2016-06-16 ENCOUNTER — Encounter: Payer: Self-pay | Admitting: Family Medicine

## 2016-06-16 VITALS — BP 108/70 | Temp 98.6°F | Ht 66.0 in | Wt 222.0 lb

## 2016-06-16 DIAGNOSIS — J329 Chronic sinusitis, unspecified: Secondary | ICD-10-CM

## 2016-06-16 DIAGNOSIS — I10 Essential (primary) hypertension: Secondary | ICD-10-CM

## 2016-06-16 MED ORDER — AMOXICILLIN-POT CLAVULANATE 875-125 MG PO TABS: 1.0000 | ORAL_TABLET | Freq: Two times a day (BID) | ORAL | 0 refills | Status: DC

## 2016-06-16 NOTE — Progress Notes (Signed)
   Subjective:    Patient ID: Megan Crane, female    DOB: April 26, 1950, 66 y.o.   MRN: [id number]  HPI Here for 3 days of sinus pressure, PND, and a dry cough. No fever.    Review of Systems  Constitutional: Negative.   HENT: Positive for congestion, postnasal drip, sinus pain, sinus pressure and sore throat.   Eyes: Negative.   Respiratory: Positive for cough.        Objective:   Physical Exam  Constitutional: She appears well-developed and well-nourished.  HENT:  Right Ear: External ear normal.  Left Ear: External ear normal.  Nose: Nose normal.  Mouth/Throat: Oropharynx is clear and moist.  Eyes: Conjunctivae are normal.  Neck: Neck supple. No thyromegaly present.  Pulmonary/Chest: Effort normal and breath sounds normal. No respiratory distress. She has no wheezes. She has no rales.  Lymphadenopathy:    She has no cervical adenopathy.          Assessment & Plan:  Sinusitis, treat with Augmentin. Add Mucinex prn.  Alysia Penna, MD

## 2016-06-16 NOTE — Telephone Encounter (Signed)
Prior authorization for name brand Benicar, pt has tried generic in past and does not work.

## 2016-06-16 NOTE — Telephone Encounter (Signed)
Pending.  Key: [id number]

## 2016-06-19 NOTE — Telephone Encounter (Signed)
PA has been approved. Insurance will fax over approval letter.

## 2016-06-22 ENCOUNTER — Other Ambulatory Visit: Payer: Managed Care, Other (non HMO)

## 2016-06-22 LAB — LIPID PANEL
CHOLESTEROL: 197 mg/dL (ref 0–200)
HDL: 42.9 mg/dL (ref >39.00)
LDL Cholesterol: 131 mg/dL — ABNORMAL HIGH (ref 0–99)
NonHDL: 154.28
Total CHOL/HDL Ratio: 5
Triglycerides: 118 mg/dL (ref 0.0–149.0)
VLDL: 23.6 mg/dL (ref 0.0–40.0)

## 2016-06-22 LAB — BASIC METABOLIC PANEL
BUN: 21 mg/dL (ref 6–23)
CHLORIDE: 103 meq/L (ref 96–112)
CO2: 30 meq/L (ref 19–32)
Calcium: 10.4 mg/dL (ref 8.4–10.5)
Creatinine, Ser: 0.94 mg/dL (ref 0.40–1.20)
GFR: 63.78 mL/min (ref >60.00)
GLUCOSE: 97 mg/dL (ref 70–99)
Potassium: 5 mEq/L (ref 3.5–5.1)
SODIUM: 139 meq/L (ref 135–145)

## 2016-06-22 LAB — HEPATIC FUNCTION PANEL
ALBUMIN: 4.2 g/dL (ref 3.5–5.2)
ALK PHOS: 60 U/L (ref 39–117)
ALT: 19 U/L (ref 0–35)
AST: 19 U/L (ref 0–37)
Bilirubin, Direct: 0.1 mg/dL (ref 0.0–0.3)
TOTAL PROTEIN: 6.9 g/dL (ref 6.0–8.3)
Total Bilirubin: 0.4 mg/dL (ref 0.2–1.2)

## 2016-06-22 LAB — CBC WITH DIFFERENTIAL/PLATELET
BASOS ABS: 0 10*3/uL (ref 0.0–0.1)
Basophils Relative: 0.6 % (ref 0.0–3.0)
Eosinophils Absolute: 0.1 10*3/uL (ref 0.0–0.7)
Eosinophils Relative: 2.1 % (ref 0.0–5.0)
HCT: 38.8 % (ref 36.0–46.0)
Hemoglobin: 13.1 g/dL (ref 12.0–15.0)
LYMPHS ABS: 1.8 10*3/uL (ref 0.7–4.0)
Lymphocytes Relative: 30.6 % (ref 12.0–46.0)
MCHC: 33.8 g/dL (ref 30.0–36.0)
MCV: 88.9 fl (ref 78.0–100.0)
MONOS PCT: 6.5 % (ref 3.0–12.0)
Monocytes Absolute: 0.4 10*3/uL (ref 0.1–1.0)
NEUTROS ABS: 3.5 10*3/uL (ref 1.4–7.7)
NEUTROS PCT: 60.2 % (ref 43.0–77.0)
PLATELETS: 189 10*3/uL (ref 150.0–400.0)
RBC: 4.36 Mil/uL (ref 3.87–5.11)
RDW: 13.6 % (ref 11.5–15.5)
WBC: 5.8 10*3/uL (ref 4.0–10.5)

## 2016-06-22 LAB — TSH: TSH: 1.91 u[IU]/mL (ref 0.35–4.50)

## 2016-09-05 ENCOUNTER — Encounter: Payer: Self-pay | Admitting: Family Medicine

## 2016-09-05 ENCOUNTER — Ambulatory Visit (INDEPENDENT_AMBULATORY_CARE_PROVIDER_SITE_OTHER): Payer: Managed Care, Other (non HMO) | Admitting: Family Medicine

## 2016-09-05 VITALS — BP 112/70 | Temp 98.4°F | Ht 66.0 in | Wt 220.0 lb

## 2016-09-05 DIAGNOSIS — J069 Acute upper respiratory infection, unspecified: Secondary | ICD-10-CM

## 2016-09-05 NOTE — Patient Instructions (Signed)
WE NOW OFFER   [facility] FAST TRACK!!!  SAME DAY Appointments for ACUTE CARE  Such as: Sprains, Injuries, cuts, abrasions, rashes, muscle pain, joint pain, back pain Colds, flu, sore throats, headache, allergies, cough, fever  Ear pain, sinus and eye infections Abdominal pain, nausea, vomiting, diarrhea, upset stomach Animal/insect bites  3 Easy Ways to Schedule: Walk-In Scheduling Call in scheduling Mychart Sign-up: [email or web address]         

## 2016-09-05 NOTE — Progress Notes (Signed)
   Subjective:    Patient ID: Megan Crane, female    DOB: 22-Jun-1950, 66 y.o.   MRN: [id number]  HPI Here for one week of sinus congestion and PND. No ST or cough.    Review of Systems  Constitutional: Negative.   HENT: Positive for congestion, postnasal drip and sinus pressure. Negative for sinus pain and sore throat.   Eyes: Negative.   Respiratory: Negative.        Objective:   Physical Exam  Constitutional: She appears well-developed and well-nourished.  HENT:  Right Ear: External ear normal.  Left Ear: External ear normal.  Nose: Nose normal.  Mouth/Throat: Oropharynx is clear and moist.  Eyes: Conjunctivae are normal.  Neck: Neck supple. No thyromegaly present.  Pulmonary/Chest: Effort normal and breath sounds normal. No respiratory distress. She has no wheezes. She has no rales.  Lymphadenopathy:    She has no cervical adenopathy.          Assessment & Plan:  Viral URI. Drink fluids. Try Zyrtec prn. Recheck prn.  Alysia Penna, MD

## 2016-09-06 ENCOUNTER — Other Ambulatory Visit: Payer: Self-pay | Admitting: Family Medicine

## 2016-09-06 ENCOUNTER — Telehealth: Payer: Self-pay | Admitting: Family Medicine

## 2016-09-06 MED ORDER — AMOXICILLIN-POT CLAVULANATE 875-125 MG PO TABS: 1.0000 | ORAL_TABLET | Freq: Two times a day (BID) | ORAL | 0 refills | Status: DC

## 2016-09-06 NOTE — Telephone Encounter (Signed)
I sent script e-scribe to [facility] and left a message for pt.

## 2016-09-06 NOTE — Telephone Encounter (Signed)
° °  Pt call to say her ears is bothering her and is asking if something can be called in for her   [place]

## 2016-09-06 NOTE — Telephone Encounter (Signed)
Call in Augmentin 875 bid for 10 days  

## 2016-09-15 ENCOUNTER — Ambulatory Visit: Payer: Managed Care, Other (non HMO)

## 2016-09-21 ENCOUNTER — Ambulatory Visit (INDEPENDENT_AMBULATORY_CARE_PROVIDER_SITE_OTHER): Payer: Managed Care, Other (non HMO)

## 2016-09-21 DIAGNOSIS — Z23 Encounter for immunization: Secondary | ICD-10-CM

## 2016-12-26 ENCOUNTER — Telehealth: Payer: Self-pay | Admitting: Family Medicine

## 2016-12-26 ENCOUNTER — Ambulatory Visit: Payer: Managed Care, Other (non HMO) | Admitting: Family Medicine

## 2016-12-26 ENCOUNTER — Encounter: Payer: Self-pay | Admitting: Family Medicine

## 2016-12-26 VITALS — BP 118/76 | HR 82 | Temp 99.1°F | Wt 220.0 lb

## 2016-12-26 DIAGNOSIS — K219 Gastro-esophageal reflux disease without esophagitis: Secondary | ICD-10-CM | POA: Insufficient documentation

## 2016-12-26 DIAGNOSIS — F411 Generalized anxiety disorder: Secondary | ICD-10-CM | POA: Diagnosis not present

## 2016-12-26 MED ORDER — LORAZEPAM 0.5 MG PO TABS: 0.5000 mg | ORAL_TABLET | Freq: Three times a day (TID) | ORAL | 0 refills | Status: DC | PRN

## 2016-12-26 MED ORDER — OMEPRAZOLE 40 MG PO CPDR: 40.0000 mg | DELAYED_RELEASE_CAPSULE | Freq: Every day | ORAL | 3 refills | Status: DC

## 2016-12-26 NOTE — Telephone Encounter (Signed)
Sent to PCP for approval.  

## 2016-12-26 NOTE — Progress Notes (Signed)
   Subjective:    Patient ID: Megan Crane, female    DOB: 1950/09/27, 66 y.o.   MRN: [id number]  HPI Here for several issues. First she describes several episodes in the past 3 months where she feels a tightness in the center of her back, where her hands tingle and where she feels extremely anxious. She always associates these spells with anxiety and never with exertion. There is no chest pain or SOB. She does have frequent heartburn, especially when she is stressed. She deals with a high level of stress in her life, and one of the main causes is a difficult marriage. She describes her husband as an alcoholic who refuses to admit there is a problem. She often feels overwhelmed, she cannot relax, and she has trouble sleeping. Her BP has been stable.    Review of Systems  Constitutional: Negative.   Respiratory: Negative.   Cardiovascular: Negative.   Musculoskeletal: Positive for back pain.  Neurological: Positive for numbness. Negative for weakness.  Psychiatric/Behavioral: Positive for dysphoric mood and sleep disturbance. Negative for agitation, behavioral problems, confusion, decreased concentration and hallucinations. The patient is nervous/anxious.        Objective:   Physical Exam  Constitutional: She appears well-developed and well-nourished.  Cardiovascular: Normal rate, regular rhythm, normal heart sounds and intact distal pulses.  Pulmonary/Chest: Effort normal and breath sounds normal. No respiratory distress. She has no wheezes. She has no rales.  Abdominal: Soft. Bowel sounds are normal. She exhibits no distension and no mass. There is no tenderness. There is no rebound and no guarding.  Musculoskeletal:  Her neck and upper back are normal with no tenderness and full ROM  Psychiatric: Her behavior is normal. Thought content normal.  Anxious and tearful           Assessment & Plan:  She has a lot of anxiety and apparently she is dealing with an alcoholic husband. She  can use Lorazepam as needed. I gave her information about [facility] and she plans to begin therapy ASAP. I also suggested she attend Al-Anon meetings. She has freuqnent GERD and certainly stress can make these worse. Try Omeprazole 40 mg daily. The spells of back pain and hands tingling are likely due to esophageal spasms, and hopefully the Omeprazole will help. Recheck here in 2 weeks.  Alysia Penna, MD

## 2016-12-26 NOTE — Telephone Encounter (Signed)
Copied from [place] [id number]. Topic: Inquiry >> Dec 26, 2016  2:33 PM Corie Chiquito, [place] wrote: Reason for CRM: Patient called to let Dr.Fry know that she is feeling better and she has made an appointment with the behavior medical on 03-20-2017 @ 1pm with Dr.Bray.If he had any questions please give her a call back at [phone]

## 2017-01-12 ENCOUNTER — Ambulatory Visit: Payer: Managed Care, Other (non HMO) | Admitting: Family Medicine

## 2017-01-12 ENCOUNTER — Encounter: Payer: Self-pay | Admitting: Family Medicine

## 2017-01-12 VITALS — BP 124/80 | HR 98 | Temp 98.7°F | Wt 223.2 lb

## 2017-01-12 DIAGNOSIS — F411 Generalized anxiety disorder: Secondary | ICD-10-CM | POA: Diagnosis not present

## 2017-01-12 NOTE — Progress Notes (Signed)
   Subjective:    Patient ID: Megan Crane, female    DOB: 07-13-1950, 67 y.o.   MRN: [id number]  HPI Here to follow up on anxiety. She saw [place] a few weeks ago for high stress levels and for occasional bouts of tightness in the chest. She had been given Omeprazole and Lorazepam but she has not taken either of these yet. She has had less chest pains lately but still struggles with anxiety. She now wants to take the medications. Review of Systems  Constitutional: Negative.   Respiratory: Negative.   Cardiovascular: Negative.   Neurological: Negative.   Psychiatric/Behavioral: Positive for dysphoric mood. The patient is nervous/anxious.        Objective:   Physical Exam  Constitutional: She is oriented to person, place, and time. She appears well-developed and well-nourished.  Cardiovascular: Normal rate, regular rhythm, normal heart sounds and intact distal pulses.  Pulmonary/Chest: Effort normal and breath sounds normal. No respiratory distress. She has no wheezes. She has no rales.  Neurological: She is alert and oriented to person, place, and time.  Psychiatric: She has a normal mood and affect. Her behavior is normal. Thought content normal.          Assessment & Plan:  Anxiety, she has agreed to use the Lorazepam as needed. I also suggested she get on a daily medication like Lexapro, but she declines at this time. She is arranging to meet with a therapist as well. Recheck prn. Alysia Penna, MD

## 2017-02-05 ENCOUNTER — Ambulatory Visit (INDEPENDENT_AMBULATORY_CARE_PROVIDER_SITE_OTHER): Payer: Managed Care, Other (non HMO) | Admitting: Family Medicine

## 2017-02-05 ENCOUNTER — Encounter: Payer: Self-pay | Admitting: Family Medicine

## 2017-02-05 VITALS — BP 100/60 | HR 98 | Temp 98.2°F | Wt 221.8 lb

## 2017-02-05 DIAGNOSIS — J018 Other acute sinusitis: Secondary | ICD-10-CM

## 2017-02-05 MED ORDER — AMOXICILLIN-POT CLAVULANATE 250-62.5 MG/5ML PO SUSR: 750.0000 mg | Freq: Two times a day (BID) | ORAL | 0 refills | Status: DC

## 2017-02-05 NOTE — Progress Notes (Signed)
   Subjective:    Patient ID: Megan Crane, female    DOB: [birth year], 67 y.o.   MRN: [id number]  HPI Here for 5 days of sinus pressure, PND, ear pain, ST, and a dry cough.    Review of Systems  Constitutional: Negative.   HENT: Positive for congestion, postnasal drip, sinus pressure, sinus pain and sore throat.   Eyes: Negative.   Respiratory: Positive for cough.        Objective:   Physical Exam  Constitutional: She appears well-developed and well-nourished.  HENT:  Right Ear: External ear normal.  Left Ear: External ear normal.  Nose: Nose normal.  Mouth/Throat: Oropharynx is clear and moist.  Eyes: Conjunctivae are normal.  Neck: No thyromegaly present.  Pulmonary/Chest: Breath sounds normal. No respiratory distress. She has no wheezes. She has no rales.  Lymphadenopathy:    She has no cervical adenopathy.          Assessment & Plan:  Sinusitis, treat with Augmentin. Alysia Penna, MD

## 2017-02-12 ENCOUNTER — Ambulatory Visit: Payer: Self-pay

## 2017-02-12 NOTE — Telephone Encounter (Signed)
Pt. Called to report her grandson has had a respiratory viral infection as well. Asking if it is possible they are "passing this back and forth." Instructed her that that is a possibility. Use good handwashing and clean hard surfaces.

## 2017-02-16 ENCOUNTER — Ambulatory Visit: Payer: Self-pay | Admitting: Family Medicine

## 2017-02-20 ENCOUNTER — Ambulatory Visit: Payer: Managed Care, Other (non HMO) | Admitting: Family Medicine

## 2017-02-20 ENCOUNTER — Encounter: Payer: Self-pay | Admitting: Family Medicine

## 2017-02-20 VITALS — BP 118/70 | HR 95 | Temp 98.5°F | Wt 225.6 lb

## 2017-02-20 DIAGNOSIS — J069 Acute upper respiratory infection, unspecified: Secondary | ICD-10-CM

## 2017-02-20 DIAGNOSIS — R053 Chronic cough: Secondary | ICD-10-CM

## 2017-02-20 DIAGNOSIS — N390 Urinary tract infection, site not specified: Secondary | ICD-10-CM | POA: Diagnosis not present

## 2017-02-20 DIAGNOSIS — R05 Cough: Secondary | ICD-10-CM

## 2017-02-20 LAB — POCT URINALYSIS DIPSTICK
Bilirubin, UA: NEGATIVE
Blood, UA: NEGATIVE
GLUCOSE UA: NEGATIVE
Ketones, UA: NEGATIVE
LEUKOCYTES UA: NEGATIVE
Nitrite, UA: NEGATIVE
Protein, UA: NEGATIVE
SPEC GRAV UA: 1.01 (ref 1.010–1.025)
Urobilinogen, UA: 0.2 E.U./dL
pH, UA: 6 (ref 5.0–8.0)

## 2017-02-20 MED ORDER — BENZONATATE 200 MG PO CAPS: 200.0000 mg | ORAL_CAPSULE | Freq: Two times a day (BID) | ORAL | 0 refills | Status: DC | PRN

## 2017-02-20 NOTE — Progress Notes (Signed)
   Subjective:    Patient ID: Megan Crane, female    DOB: 02/24/1950, 67 y.o.   MRN: [id number]  HPI Here for a lingering cough. We saw here a few weeks ago for a sinusitis and she was started on Augmentin. These symptoms (sinus pressure and PND) have greatly improved but she now has developed more of a deep unproductive cough. She has been spending time with her 1 month old grandson who has been sick with a cough this past week. She has not had a fever.    Review of Systems  Constitutional: Negative.   HENT: Negative.   Eyes: Negative.   Respiratory: Positive for cough and chest tightness. Negative for shortness of breath and wheezing.   Cardiovascular: Negative.        Objective:   Physical Exam  Constitutional: She is oriented to person, place, and time. She appears well-developed and well-nourished. No distress.  HENT:  Right Ear: External ear normal.  Left Ear: External ear normal.  Nose: Nose normal.  Mouth/Throat: Oropharynx is clear and moist.  Eyes: Conjunctivae are normal.  Neck: No thyromegaly present.  Cardiovascular: Normal rate, regular rhythm, normal heart sounds and intact distal pulses.  Pulmonary/Chest: Effort normal and breath sounds normal. No respiratory distress. She has no wheezes. She has no rales.  Lymphadenopathy:    She has no cervical adenopathy.  Neurological: She is alert and oriented to person, place, and time.          Assessment & Plan:  Her sinusitis seems to be resolved. She now has a viral URI she probably caught from her grandson. We will get a CXR to be sure. Use Benzonatate and Delsym as needed.  Alysia Penna, MD

## 2017-02-22 ENCOUNTER — Ambulatory Visit (INDEPENDENT_AMBULATORY_CARE_PROVIDER_SITE_OTHER)
Admission: RE | Admit: 2017-02-22 | Discharge: 2017-02-22 | Disposition: A | Discharge status: Home / Self Care | Payer: Managed Care, Other (non HMO) | Source: Ambulatory Visit | Attending: Family Medicine | Admitting: Family Medicine

## 2017-02-22 DIAGNOSIS — R05 Cough: Secondary | ICD-10-CM | POA: Diagnosis not present

## 2017-02-22 DIAGNOSIS — R053 Chronic cough: Secondary | ICD-10-CM

## 2017-02-23 ENCOUNTER — Telehealth: Payer: Self-pay | Admitting: Family Medicine

## 2017-02-23 ENCOUNTER — Other Ambulatory Visit: Payer: Self-pay | Admitting: Family Medicine

## 2017-02-23 MED ORDER — CEFUROXIME AXETIL 500 MG PO TABS: 500.0000 mg | ORAL_TABLET | Freq: Two times a day (BID) | ORAL | 0 refills | Status: DC

## 2017-02-23 NOTE — Telephone Encounter (Signed)
Pt called questioning Dx of pneumonia. Discussed viral vs.bacterial ,use of antibiotics and contagion. Pt verbalizes understanding.

## 2017-03-02 ENCOUNTER — Ambulatory Visit: Payer: Managed Care, Other (non HMO) | Admitting: Osteopathic Medicine

## 2017-03-07 ENCOUNTER — Ambulatory Visit: Payer: Self-pay | Admitting: Family Medicine

## 2017-03-08 ENCOUNTER — Ambulatory Visit: Payer: Managed Care, Other (non HMO) | Admitting: Family Medicine

## 2017-03-08 ENCOUNTER — Other Ambulatory Visit: Payer: Self-pay | Admitting: Family Medicine

## 2017-03-08 ENCOUNTER — Encounter: Payer: Self-pay | Admitting: Family Medicine

## 2017-03-08 VITALS — BP 108/88 | HR 96 | Temp 97.9°F | Wt 221.0 lb

## 2017-03-08 DIAGNOSIS — J181 Lobar pneumonia, unspecified organism: Secondary | ICD-10-CM

## 2017-03-08 DIAGNOSIS — J189 Pneumonia, unspecified organism: Secondary | ICD-10-CM

## 2017-03-08 NOTE — Progress Notes (Signed)
   Subjective:    Patient ID: Megan Crane, female    DOB: 04/25/1950, 67 y.o.   MRN: [id number]  HPI Here to follow up a pneumonia. She was seen here 2 weeks ago for a cough and fever. We did not hear much on her chest exam but a CXR that day showed a probable LUL pneumonia. She was started on Ceftin, which she has now finished. She feels much better, she has more energy.   Review of Systems  Constitutional: Negative.   HENT: Negative.   Eyes: Negative.   Respiratory: Negative.   Cardiovascular: Negative.        Objective:   Physical Exam  Constitutional: She appears well-developed and well-nourished.  HENT:  Right Ear: External ear normal.  Left Ear: External ear normal.  Nose: Nose normal.  Mouth/Throat: Oropharynx is clear and moist.  Eyes: Conjunctivae are normal.  Neck: No thyromegaly present.  Cardiovascular: Normal rate, regular rhythm, normal heart sounds and intact distal pulses.  Pulmonary/Chest: Effort normal and breath sounds normal. No respiratory distress. She has no wheezes. She has no rales.  Lymphadenopathy:    She has no cervical adenopathy.          Assessment & Plan:  She has recovered from a LLL CAP. She will rest and take it easy. No more babysitting her grandchildren for the next month.  Alysia Penna, MD

## 2017-03-09 ENCOUNTER — Ambulatory Visit: Payer: Self-pay | Admitting: Family Medicine

## 2017-03-13 ENCOUNTER — Encounter: Payer: Self-pay | Admitting: *Deleted

## 2017-03-13 ENCOUNTER — Ambulatory Visit: Payer: Self-pay | Admitting: *Deleted

## 2017-03-13 NOTE — Telephone Encounter (Signed)
This encounter was created in error - please disregard.

## 2017-03-13 NOTE — Telephone Encounter (Signed)
Pt   Reports  Still  Has  A  Cough   Has  occasional  Weakness   Recently  Had  pnuemonia . Finished  Course  Of  Anti biotics .  Desiring  followup to make  Sure  She can baby sit her grandchildren  Advised  To  Drink lots  Of  Fluids     Reason for Disposition . Cough has been present for > 3 weeks  Answer Assessment - Initial Assessment Questions 1. ONSET: "When did the cough begin?"        Long  Time    Last   Seen  On   28  Feb   2. SEVERITY: "How bad is the cough today?"         Aggrevating  And  persistant   3. RESPIRATORY DISTRESS: "Describe your breathing."        No distress   4. FEVER: "Do you have a fever?" If so, ask: "What is your temperature, how was it measured, and when did it start?"        No 5. HEMOPTYSIS: "Are you coughing up any blood?" If so ask: "How much?" (flecks, streaks, tablespoons, etc.)         No 6. TREATMENT: "What have you done so far to treat the cough?" (e.g., meds, fluids, humidifier)      Finished  Course  Of  Anti  Biotic   Recent pnuemonia  flonase  cLARITIN    7. CARDIAC HISTORY: "Do you have any history of heart disease?" (e.g., heart attack, congestive heart failure)        No 8. LUNG HISTORY: "Do you have any history of lung disease?"  (e.g., pulmonary embolus, asthma, emphysema)     No 9. PE RISK FACTORS: "Do you have a history of blood clots?" (or: recent major surgery, recent prolonged travel, bedridden )      No 10. OTHER SYMPTOMS: "Do you have any other symptoms? (e.g., runny nose, wheezing, chest pain)       Sinus  Drainage   Non  Productive  Cough  occasional  Fatigue   11. PREGNANCY: "Is there any chance you are pregnant?" "When was your last menstrual period?"         N/A   12. TRAVEL: "Have you traveled out of the country in the last month?" (e.g., travel history, exposures)         NO  Protocols used: COUGH - ACUTE NON-PRODUCTIVE-A-AH

## 2017-03-15 ENCOUNTER — Ambulatory Visit (INDEPENDENT_AMBULATORY_CARE_PROVIDER_SITE_OTHER)
Admission: RE | Admit: 2017-03-15 | Discharge: 2017-03-15 | Disposition: A | Discharge status: Home / Self Care | Payer: Managed Care, Other (non HMO) | Source: Ambulatory Visit | Attending: Family Medicine | Admitting: Family Medicine

## 2017-03-15 ENCOUNTER — Encounter: Payer: Self-pay | Admitting: Family Medicine

## 2017-03-15 ENCOUNTER — Ambulatory Visit: Payer: Managed Care, Other (non HMO) | Admitting: Family Medicine

## 2017-03-15 VITALS — BP 142/82 | HR 77 | Temp 98.6°F | Wt 220.8 lb

## 2017-03-15 DIAGNOSIS — J189 Pneumonia, unspecified organism: Secondary | ICD-10-CM

## 2017-03-15 NOTE — Progress Notes (Signed)
   Subjective:    Patient ID: Megan Crane, female    DOB: [birth year], 67 y.o.   MRN: [id number]  HPI Here to follow up on a LLL pneumonia seen on a CXR on 02-22-17. She was treated with a course of Ceftin and she now feels much better. She still has a slight dry cough but her energy is returning.   Review of Systems  Constitutional: Negative.   HENT: Negative.   Eyes: Negative.   Respiratory: Positive for cough. Negative for chest tightness, shortness of breath and wheezing.   Cardiovascular: Negative.        Objective:   Physical Exam  Constitutional: She is oriented to person, place, and time. She appears well-developed and well-nourished.  Cardiovascular: Normal rate, regular rhythm, normal heart sounds and intact distal pulses.  Pulmonary/Chest: Effort normal and breath sounds normal. No respiratory distress. She has no wheezes. She has no rales.  Neurological: She is alert and oriented to person, place, and time.          Assessment & Plan:  Follow up of CAP. She will get another CXR today.  Alysia Penna, MD

## 2017-03-20 ENCOUNTER — Ambulatory Visit: Payer: 59 | Admitting: Psychology

## 2017-03-20 DIAGNOSIS — F4322 Adjustment disorder with anxiety: Secondary | ICD-10-CM

## 2017-03-22 ENCOUNTER — Telehealth: Payer: Self-pay | Admitting: Family Medicine

## 2017-03-22 NOTE — Telephone Encounter (Signed)
Called and spoke with pt. Pt stated that she was exposed to someone who was sick and she was recently Dx with pneumonia and she at one point was starting to feel better. However, since she was around the person who was sick she is now dealing with a bad deep productive cough and inner ears feel itchy.   I advised the pt that Dr. Sarajane Jews was out of the office and will not be back until Monday. Pt advised and voiced understanding. She stated that she will try just some home remedies today and take it easy if not better she will call back tomorrow.

## 2017-03-22 NOTE — Telephone Encounter (Signed)
PT called back requesting either a call from [place] or have [place] ask one of the providers to call her in a medication to:   [place], [place] - [phone] [address] [phone] (Phone) [phone] (Fax)

## 2017-03-22 NOTE — Telephone Encounter (Signed)
Copied from [place] [id number]. Topic: General - Other >> Mar 22, 2017 10:23 AM Darl Householder, RMA wrote: Reason for CRM: patient is requesting a call back concerning a new prescription she is just getting over pneumonia and has been around someone who has a cold and now she has some symptoms and wanted to know what Dr. Sarajane Jews would like for her to do or can she take OTC medications to treat symptoms

## 2017-03-23 ENCOUNTER — Ambulatory Visit: Payer: Self-pay

## 2017-03-23 NOTE — Telephone Encounter (Signed)
Sent to [facility] to see if you willing to send in medication for pt due to PCP being out of the office.. If not any suggestion for OTC medications?  Thanks [place]

## 2017-03-23 NOTE — Telephone Encounter (Signed)
Patient called in with c/o "cold symptoms." She says "I had pneumonia and got over that. Then I developed a cold and I just want to know if this is something I need to watch over the weekend or come in to be seen. I feel a lot better today than yesterday. I've been taking my allergy medication, but skipped a few doses of loratadine. I have some drainage to the back of my throat. I cough, but it's worse at night when I lay down. Very little comes up to the back of my throat, then goes back down. Nothing like the cough I had with pneumonia. My ears have been itchy, but not as bad as when the cold started. I don't have a fever." I asked about sore throat, nausea, vomiting, wheezing, she denies. According to protocol, home care advice given, she verbalized understanding. Advised to monitor her symptoms and if she gets worse, go to UC/ED or call the office next week to be seen, she verbalized understanding.  Reason for Disposition . Care advice for mild cough, questions about  Answer Assessment - Initial Assessment Questions 1. ONSET: "When did the nasal discharge start?"      Sinus drainage back of throat 2. AMOUNT: "How much discharge is there?"      Not much 3. COUGH: "Do you have a cough?" If yes, ask: "Describe the color of your sputum" (clear, white, yellow, green)     Yes, not enough sputum to come up to see it 4. RESPIRATORY DISTRESS: "Describe your breathing."      Breathing is fine 5. FEVER: "Do you have a fever?" If so, ask: "What is your temperature, how was it measured, and when did it start?"    No 6. SEVERITY: "Overall, how bad are you feeling right now?" (e.g., doesn't interfere with normal activities, staying home from school/work, staying in bed)      Better than yesterday, body not aching 7. OTHER SYMPTOMS: "Do you have any other symptoms?" (e.g., sore throat, earache, wheezing, vomiting)     Itchy ear 8. PREGNANCY: "Is there any chance you are pregnant?" "When was your last  menstrual period?"     No  Protocols used: COMMON COLD-A-AH

## 2017-03-26 NOTE — Telephone Encounter (Signed)
This sounds viral right now. Since she just got off several rounds of antibiotics I would like to avoid these if we can. Try Mucinex and fluids and see me if it gets any worse

## 2017-03-26 NOTE — Telephone Encounter (Signed)
Called pt and left a detailed VM. Advised to call back if needed. Left my name and call back number.

## 2017-03-30 ENCOUNTER — Ambulatory Visit: Payer: 59 | Admitting: Psychology

## 2017-03-30 DIAGNOSIS — F4322 Adjustment disorder with anxiety: Secondary | ICD-10-CM

## 2017-04-30 ENCOUNTER — Other Ambulatory Visit: Payer: Self-pay | Admitting: Family Medicine

## 2017-05-08 ENCOUNTER — Ambulatory Visit: Payer: 59 | Admitting: Psychology

## 2017-05-08 DIAGNOSIS — F4322 Adjustment disorder with anxiety: Secondary | ICD-10-CM

## 2017-05-24 ENCOUNTER — Telehealth: Payer: Self-pay | Admitting: *Deleted

## 2017-05-24 NOTE — Telephone Encounter (Signed)
PA initiated for Benicar 40 mg via CoverMyMeds. Key [id number].

## 2017-05-25 NOTE — Telephone Encounter (Signed)
PA denied per CoverMyMeds.  Marita [place] you keep an eye out for denial letter? There were no details provided about why PA was denied on CoverMyMeds. Thanks!

## 2017-05-25 NOTE — Telephone Encounter (Signed)
Megan Crane with [facility] calling and states the Benicar 40 mg was originally declined due to they did not have anything to support medical necessity or that she tried candesartan, irbesartan, losartan, telmisartan, valsartan that are covered by pts insurance. In order for Benicar to be covered the department needs documentation that pt has tried 4 out of the 5 of the covered medications and that they failed to help the pt. CB#: [phone] (prior auth dept).

## 2017-05-28 NOTE — Telephone Encounter (Signed)
Please advise. She has only tried and failed losartan-HCTZ.

## 2017-05-28 NOTE — Telephone Encounter (Signed)
Stop Benicar and call in Valsartan 40 mg daily, #90 with 3 rf

## 2017-05-29 ENCOUNTER — Telehealth: Payer: Self-pay | Admitting: Family Medicine

## 2017-05-29 MED ORDER — VALSARTAN 40 MG PO TABS: 40.0000 mg | ORAL_TABLET | Freq: Every day | ORAL | 3 refills | Status: DC

## 2017-05-29 NOTE — Telephone Encounter (Signed)
Medication filled to pharmacy as requested. Pt notified of results/instructions and verbalized understanding. 

## 2017-05-29 NOTE — Telephone Encounter (Signed)
Copied from [place] [id number]. Topic: Quick Communication - See Telephone Encounter >> May 29, 2017  1:12 PM Vernona Rieger wrote: CRM for notification. See Telephone encounter for: 05/29/17.  [employer] called and needs a prior authorization with additional information on BENICAR 40 mg. He needs chart notes to the effect that the patient can only use the Benicar.   Call back is [phone] Fax number is [phone] or fax it to appeal [phone]

## 2017-05-29 NOTE — Telephone Encounter (Signed)
Patient was changed to valsartan per Dr. Sarajane Jews.

## 2017-06-05 ENCOUNTER — Ambulatory Visit (INDEPENDENT_AMBULATORY_CARE_PROVIDER_SITE_OTHER): Payer: 59 | Admitting: Psychology

## 2017-06-05 DIAGNOSIS — F4322 Adjustment disorder with anxiety: Secondary | ICD-10-CM

## 2017-06-13 ENCOUNTER — Ambulatory Visit: Payer: Self-pay | Admitting: Family Medicine

## 2017-06-15 ENCOUNTER — Ambulatory Visit (INDEPENDENT_AMBULATORY_CARE_PROVIDER_SITE_OTHER): Payer: Managed Care, Other (non HMO) | Admitting: Family Medicine

## 2017-06-15 ENCOUNTER — Encounter: Payer: Self-pay | Admitting: Family Medicine

## 2017-06-15 VITALS — BP 124/64 | HR 86 | Temp 98.1°F | Ht 66.0 in | Wt 219.2 lb

## 2017-06-15 DIAGNOSIS — I83893 Varicose veins of bilateral lower extremities with other complications: Secondary | ICD-10-CM

## 2017-06-15 DIAGNOSIS — D170 Benign lipomatous neoplasm of skin and subcutaneous tissue of head, face and neck: Secondary | ICD-10-CM | POA: Diagnosis not present

## 2017-06-15 DIAGNOSIS — J019 Acute sinusitis, unspecified: Secondary | ICD-10-CM | POA: Diagnosis not present

## 2017-06-15 MED ORDER — MUPIROCIN CALCIUM 2 % NA OINT: 1.0000 "application " | TOPICAL_OINTMENT | Freq: Two times a day (BID) | NASAL | 2 refills | Status: DC

## 2017-06-15 MED ORDER — AMOXICILLIN-POT CLAVULANATE 875-125 MG PO TABS: 1.0000 | ORAL_TABLET | Freq: Two times a day (BID) | ORAL | 0 refills | Status: DC

## 2017-06-17 ENCOUNTER — Encounter: Payer: Self-pay | Admitting: Family Medicine

## 2017-06-17 NOTE — Progress Notes (Signed)
   Subjective:    Patient ID: Megan Crane, female    DOB: 1950-08-02, 67 y.o.   MRN: [id number]  HPI Here for several issues. First she has had 2 weeks of sinus pressure, PND, ST, and a dry cough. Second she asks me to check a lump on the anterior neck that has been present for years. It has not changed and is not symptomatic. Third she has chronic swelling of the legs despite taking Lasix 20 mg daily. They are not painful. No SOB.    ROS Neck swelling. No trouble swallowing. Normal voice. GI clear. Neuro intact.  Objective:   Physical Exam  Constitutional: She appears well-developed and well-nourished.  HENT:  Right Ear: External ear normal.  Left Ear: External ear normal.  Nose: Nose normal.  Mouth/Throat: Oropharynx is clear and moist.  Eyes: Conjunctivae are normal.  Neck: Neck supple. No thyromegaly present.  Small firm non-tender mobile mass above the manubrium  Cardiovascular: Normal rate, regular rhythm, normal heart sounds and intact distal pulses.  Pulmonary/Chest: Effort normal and breath sounds normal. No stridor. No respiratory distress. She has no wheezes. She has no rales.  Musculoskeletal:  2+ edema in both lower legs   Lymphadenopathy:    She has no cervical adenopathy.          Assessment & Plan:  She has a sinusitis and we will treat this with Augmentin. She has a lipoma on the anterior neck. This is benign and we will observe only. She has venous insufficiency in the legs and she will wear waist high compression stockings.  Alysia Penna, MD

## 2017-06-26 ENCOUNTER — Telehealth: Payer: Self-pay | Admitting: Family Medicine

## 2017-06-26 DIAGNOSIS — H348122 Central retinal vein occlusion, left eye, stable: Secondary | ICD-10-CM

## 2017-06-26 NOTE — Telephone Encounter (Signed)
Copied from [place] [id number]. Topic: General - Other >> Jun 26, 2017 10:23 AM Alfredia Ferguson R wrote: Reason for CRM: Pt states Megan Crane ophthalmology is suppose to nitify dr fry of spot that was being her eye for lab work. She wanted to be notified once dr fry is notified so she can get labs done before vacation.

## 2017-06-27 NOTE — Telephone Encounter (Signed)
Pt states that she saw eye dr and they found a suspicious spot behind one of her eyes. They are supposed to contact Dr Sarajane Jews to discuss.  Dr. Sarajane Jews - FYI. Thanks!

## 2017-06-28 NOTE — Telephone Encounter (Signed)
I agree with the recommendations from Dr. Lajoyce Corners (her optometrist).  I have put in orders for her to have labs drawn (she should be fasting) and to have carotid dopplers done sometime soon. Also I suggest she start taking an 81 mg aspirin every day.

## 2017-06-28 NOTE — Telephone Encounter (Signed)
Noted  

## 2017-06-28 NOTE — Telephone Encounter (Signed)
Called and spoke with pt. Pt advised and voiced understanding.  

## 2017-06-28 NOTE — Addendum Note (Signed)
Addended by: Alysia Penna A on: 06/28/2017 12:45 PM   Modules accepted: Orders

## 2017-06-28 NOTE — Telephone Encounter (Signed)
Pt has been schedule for lab work on Friday 07/06/2017 at 10 AM pt wanted to know if she will getting tested for DM?  Sent to PCP

## 2017-06-28 NOTE — Telephone Encounter (Signed)
Called pt and left a VM to call back. CRM created and sent to [facility] pool. 

## 2017-06-28 NOTE — Telephone Encounter (Signed)
Patient called to check the status on a spot behind her eye that was found by the ophthalmologist.  She would like Dr. Sarajane Jews to let her know the next steps that should be taken.  She stated that she is going out of town and would like to know as soon as possible.  CB# [phone] or [phone].

## 2017-06-29 NOTE — Telephone Encounter (Signed)
Yes diabetes is one of the things we are testing for

## 2017-07-04 ENCOUNTER — Encounter (HOSPITAL_COMMUNITY): Payer: Self-pay

## 2017-07-05 ENCOUNTER — Other Ambulatory Visit: Payer: Self-pay | Admitting: Family Medicine

## 2017-07-05 ENCOUNTER — Ambulatory Visit (HOSPITAL_COMMUNITY)
Admission: RE | Admit: 2017-07-05 | Discharge: 2017-07-05 | Disposition: A | Discharge status: Home / Self Care | Payer: Managed Care, Other (non HMO) | Source: Ambulatory Visit | Attending: Cardiovascular Disease | Admitting: Cardiovascular Disease

## 2017-07-05 DIAGNOSIS — R51 Headache: Secondary | ICD-10-CM | POA: Insufficient documentation

## 2017-07-05 DIAGNOSIS — H348122 Central retinal vein occlusion, left eye, stable: Secondary | ICD-10-CM

## 2017-07-05 DIAGNOSIS — I10 Essential (primary) hypertension: Secondary | ICD-10-CM | POA: Diagnosis not present

## 2017-07-05 DIAGNOSIS — E785 Hyperlipidemia, unspecified: Secondary | ICD-10-CM | POA: Diagnosis not present

## 2017-07-05 DIAGNOSIS — I6523 Occlusion and stenosis of bilateral carotid arteries: Secondary | ICD-10-CM | POA: Diagnosis not present

## 2017-07-06 ENCOUNTER — Other Ambulatory Visit (INDEPENDENT_AMBULATORY_CARE_PROVIDER_SITE_OTHER): Payer: Managed Care, Other (non HMO)

## 2017-07-06 DIAGNOSIS — H348122 Central retinal vein occlusion, left eye, stable: Secondary | ICD-10-CM | POA: Diagnosis not present

## 2017-07-06 LAB — CBC WITH DIFFERENTIAL/PLATELET
BASOS ABS: 0 10*3/uL (ref 0.0–0.1)
Basophils Relative: 0.8 % (ref 0.0–3.0)
Eosinophils Absolute: 0.1 10*3/uL (ref 0.0–0.7)
Eosinophils Relative: 1.7 % (ref 0.0–5.0)
HCT: 40 % (ref 36.0–46.0)
Hemoglobin: 13.4 g/dL (ref 12.0–15.0)
LYMPHS ABS: 1.8 10*3/uL (ref 0.7–4.0)
LYMPHS PCT: 34.7 % (ref 12.0–46.0)
MCHC: 33.4 g/dL (ref 30.0–36.0)
MCV: 90.9 fl (ref 78.0–100.0)
MONOS PCT: 8.6 % (ref 3.0–12.0)
Monocytes Absolute: 0.5 10*3/uL (ref 0.1–1.0)
NEUTROS PCT: 54.2 % (ref 43.0–77.0)
Neutro Abs: 2.9 10*3/uL (ref 1.4–7.7)
Platelets: 193 10*3/uL (ref 150.0–400.0)
RBC: 4.4 Mil/uL (ref 3.87–5.11)
RDW: 13.7 % (ref 11.5–15.5)
WBC: 5.3 10*3/uL (ref 4.0–10.5)

## 2017-07-06 LAB — HEPATIC FUNCTION PANEL
ALT: 21 U/L (ref 0–35)
AST: 19 U/L (ref 0–37)
Albumin: 4.2 g/dL (ref 3.5–5.2)
Alkaline Phosphatase: 59 U/L (ref 39–117)
BILIRUBIN DIRECT: 0.1 mg/dL (ref 0.0–0.3)
BILIRUBIN TOTAL: 0.4 mg/dL (ref 0.2–1.2)
TOTAL PROTEIN: 6.8 g/dL (ref 6.0–8.3)

## 2017-07-06 LAB — BASIC METABOLIC PANEL
BUN: 20 mg/dL (ref 6–23)
CHLORIDE: 103 meq/L (ref 96–112)
CO2: 30 meq/L (ref 19–32)
Calcium: 10 mg/dL (ref 8.4–10.5)
Creatinine, Ser: 0.82 mg/dL (ref 0.40–1.20)
GFR: 74.43 mL/min (ref >60.00)
GLUCOSE: 95 mg/dL (ref 70–99)
Potassium: 4.7 mEq/L (ref 3.5–5.1)
Sodium: 140 mEq/L (ref 135–145)

## 2017-07-06 LAB — LIPID PANEL
CHOLESTEROL: 229 mg/dL — AB (ref 0–200)
HDL: 45.9 mg/dL (ref >39.00)
LDL CALC: 164 mg/dL — AB (ref 0–99)
NonHDL: 183.51
TRIGLYCERIDES: 97 mg/dL (ref 0.0–149.0)
Total CHOL/HDL Ratio: 5
VLDL: 19.4 mg/dL (ref 0.0–40.0)

## 2017-07-06 LAB — TSH: TSH: 1.46 u[IU]/mL (ref 0.35–4.50)

## 2017-07-06 LAB — SEDIMENTATION RATE: SED RATE: 18 mm/h (ref 0–30)

## 2017-07-06 LAB — C-REACTIVE PROTEIN: CRP: 0.2 mg/dL — AB (ref 0.5–20.0)

## 2017-07-10 ENCOUNTER — Telehealth: Payer: Self-pay | Admitting: Family Medicine

## 2017-07-10 ENCOUNTER — Ambulatory Visit (INDEPENDENT_AMBULATORY_CARE_PROVIDER_SITE_OTHER): Payer: 59 | Admitting: Psychology

## 2017-07-10 DIAGNOSIS — F4322 Adjustment disorder with anxiety: Secondary | ICD-10-CM

## 2017-07-10 NOTE — Telephone Encounter (Signed)
I have called pt twice and left a VM to call back CRM created and sent to [facility] pool.

## 2017-07-10 NOTE — Telephone Encounter (Signed)
Copied from [place] [id number]. Topic: Quick Communication - See Telephone Encounter >> Jul 10, 2017  2:22 PM Vernona Rieger wrote: CRM for notification. See Telephone encounter for: 07/10/17.  Patient is requesting her lab work from 6/28. Only call if they have came back in. She is also inquiring about the doppler test.

## 2017-07-19 ENCOUNTER — Ambulatory Visit: Payer: 59 | Admitting: Psychology

## 2017-07-19 DIAGNOSIS — F4322 Adjustment disorder with anxiety: Secondary | ICD-10-CM | POA: Diagnosis not present

## 2017-07-24 ENCOUNTER — Telehealth: Payer: Self-pay

## 2017-07-24 ENCOUNTER — Encounter: Payer: Self-pay | Admitting: Family Medicine

## 2017-07-24 DIAGNOSIS — R739 Hyperglycemia, unspecified: Secondary | ICD-10-CM

## 2017-07-24 LAB — HM DIABETES EYE EXAM

## 2017-07-24 NOTE — Telephone Encounter (Signed)
Copied from [place] [id number]. Topic: Inquiry >> Jul 24, 2017  1:17 PM Oliver Pila B wrote: Reason for CRM: pt called to get the diabetes test started b/c she met w/ the ophthalmologist  today, contact pt to advise the pt asked for [place]

## 2017-07-25 ENCOUNTER — Telehealth: Payer: Self-pay | Admitting: Family Medicine

## 2017-07-25 NOTE — Addendum Note (Signed)
Addended by: Alysia Penna A on: 07/25/2017 03:36 PM   Modules accepted: Orders

## 2017-07-25 NOTE — Telephone Encounter (Signed)
Sent to PCP as an FYI might need to check A1c

## 2017-07-25 NOTE — Telephone Encounter (Signed)
I ordered the A1c

## 2017-07-25 NOTE — Telephone Encounter (Signed)
No message   Scheduled appointment

## 2017-08-02 ENCOUNTER — Ambulatory Visit (INDEPENDENT_AMBULATORY_CARE_PROVIDER_SITE_OTHER): Payer: Managed Care, Other (non HMO) | Admitting: Family Medicine

## 2017-08-02 ENCOUNTER — Encounter: Payer: Self-pay | Admitting: Family Medicine

## 2017-08-02 ENCOUNTER — Ambulatory Visit: Payer: Self-pay | Admitting: Family Medicine

## 2017-08-02 VITALS — BP 110/80 | Temp 98.4°F | Wt 215.0 lb

## 2017-08-02 DIAGNOSIS — R739 Hyperglycemia, unspecified: Secondary | ICD-10-CM | POA: Diagnosis not present

## 2017-08-02 DIAGNOSIS — I10 Essential (primary) hypertension: Secondary | ICD-10-CM

## 2017-08-02 DIAGNOSIS — E782 Mixed hyperlipidemia: Secondary | ICD-10-CM | POA: Diagnosis not present

## 2017-08-02 LAB — POCT GLYCOSYLATED HEMOGLOBIN (HGB A1C): Hemoglobin A1C: 5.1 % (ref 4.0–5.6)

## 2017-08-02 NOTE — Progress Notes (Signed)
   Subjective:    Patient ID: Megan Crane, female    DOB: 1950/01/27, 67 y.o.   MRN: [id number]  HPI Here to follow up. She has been seeing Laurence Aly OD for what was felt originally to be a central retinal vein occlusion, but this turned out to not be the case. On a recent exam the spot seen on her retina has not changed in size and it located away from the retinal vein as well. We have been paying a lot of attention to her carotid circulation, and she had a set of dopplers showing 1-39% stenoses bilaterally. She has made some dramatic changes in her diet to reduce her consumption of fats and carbs. Her A1c today is excellent at 5.1. Her BP is stable. The last lipid panel she had drawn a month ago showed her LDL had risen to 164. She and a friend have gotten into a routine of walking about 3 miles every day.  She feels well in general.    Review of Systems  Constitutional: Negative.   Respiratory: Negative.   Cardiovascular: Negative.   Neurological: Negative.        Objective:   Physical Exam  Constitutional: She is oriented to person, place, and time. She appears well-developed.  Cardiovascular: Normal rate, regular rhythm, normal heart sounds and intact distal pulses.  Pulmonary/Chest: Effort normal and breath sounds normal. No stridor. No respiratory distress. She has no wheezes. She has no rales.  Neurological: She is alert and oriented to person, place, and time.          Assessment & Plan:  She is doing well as far as BP and glucose management. We need to get her lipids under better control. We agreed for her to work on the new aggressive diet plan, and in 5 more months we will repeat a lipid panel. If this has not improved adequately we plan to start her on a statin.  Alysia Penna, MD

## 2017-08-24 ENCOUNTER — Ambulatory Visit: Payer: Self-pay | Admitting: Family Medicine

## 2017-08-24 ENCOUNTER — Ambulatory Visit: Payer: Managed Care, Other (non HMO) | Admitting: Psychology

## 2017-08-24 DIAGNOSIS — F4322 Adjustment disorder with anxiety: Secondary | ICD-10-CM | POA: Diagnosis not present

## 2017-09-07 ENCOUNTER — Ambulatory Visit: Payer: Managed Care, Other (non HMO) | Admitting: Family Medicine

## 2017-09-07 ENCOUNTER — Ambulatory Visit (INDEPENDENT_AMBULATORY_CARE_PROVIDER_SITE_OTHER): Payer: Managed Care, Other (non HMO)

## 2017-09-07 ENCOUNTER — Encounter: Payer: Self-pay | Admitting: Family Medicine

## 2017-09-07 VITALS — BP 118/72 | HR 85 | Temp 98.4°F | Ht 66.0 in | Wt 213.4 lb

## 2017-09-07 DIAGNOSIS — G8929 Other chronic pain: Secondary | ICD-10-CM

## 2017-09-07 DIAGNOSIS — M25561 Pain in right knee: Secondary | ICD-10-CM

## 2017-09-07 NOTE — Progress Notes (Signed)
   Subjective:    Patient ID: Megan Crane, female    DOB: 11/19/1950, 67 y.o.   MRN: [id number]  HPI Here for several months of intermittent pain in the medial right knee. No locking or giving way. No hx of trauma. No swelling.    Review of Systems  Constitutional: Negative.   Respiratory: Negative.   Cardiovascular: Negative.   Musculoskeletal: Positive for arthralgias.       Objective:   Physical Exam  Constitutional:  She has a slight limp   Cardiovascular: Normal rate, regular rhythm, normal heart sounds and intact distal pulses.  Pulmonary/Chest: Effort normal and breath sounds normal.  Musculoskeletal:  The right knee has full ROM. Mild crepitus is present. She is tender along the medial joint space. McMurrays is negative           Assessment & Plan:  Right knee pain, likely early degenerative arthritis. She will wear an elastic support sleeve while at work. Take Aleve bid prn. Get Xrays today.  Alysia Penna, MD

## 2017-09-28 ENCOUNTER — Ambulatory Visit: Payer: 59 | Admitting: Psychology

## 2017-09-28 DIAGNOSIS — F4322 Adjustment disorder with anxiety: Secondary | ICD-10-CM

## 2017-10-05 ENCOUNTER — Ambulatory Visit (INDEPENDENT_AMBULATORY_CARE_PROVIDER_SITE_OTHER): Payer: Managed Care, Other (non HMO)

## 2017-10-05 DIAGNOSIS — Z23 Encounter for immunization: Secondary | ICD-10-CM

## 2017-10-15 ENCOUNTER — Encounter: Payer: Self-pay | Admitting: Family Medicine

## 2017-10-23 ENCOUNTER — Ambulatory Visit: Payer: 59 | Admitting: Psychology

## 2017-10-23 DIAGNOSIS — F4322 Adjustment disorder with anxiety: Secondary | ICD-10-CM | POA: Diagnosis not present

## 2017-11-02 ENCOUNTER — Ambulatory Visit: Payer: Self-pay | Admitting: Family Medicine

## 2017-11-09 ENCOUNTER — Encounter: Payer: Self-pay | Admitting: Family Medicine

## 2017-11-09 ENCOUNTER — Ambulatory Visit (INDEPENDENT_AMBULATORY_CARE_PROVIDER_SITE_OTHER): Payer: Medicare Other | Admitting: Family Medicine

## 2017-11-09 VITALS — BP 128/78 | HR 75 | Temp 98.3°F | Wt 209.5 lb

## 2017-11-09 DIAGNOSIS — E782 Mixed hyperlipidemia: Secondary | ICD-10-CM

## 2017-11-09 DIAGNOSIS — I6523 Occlusion and stenosis of bilateral carotid arteries: Secondary | ICD-10-CM | POA: Diagnosis not present

## 2017-11-09 DIAGNOSIS — F411 Generalized anxiety disorder: Secondary | ICD-10-CM

## 2017-11-09 DIAGNOSIS — R634 Abnormal weight loss: Secondary | ICD-10-CM | POA: Diagnosis not present

## 2017-11-09 LAB — LIPID PANEL
CHOLESTEROL: 220 mg/dL — AB (ref 0–200)
HDL: 44.1 mg/dL (ref >39.00)
LDL CALC: 146 mg/dL — AB (ref 0–99)
NonHDL: 176.13
Total CHOL/HDL Ratio: 5
Triglycerides: 150 mg/dL — ABNORMAL HIGH (ref 0.0–149.0)
VLDL: 30 mg/dL (ref 0.0–40.0)

## 2017-11-09 NOTE — Progress Notes (Signed)
   Subjective:    Patient ID: Megan Crane, female    DOB: Dec 02, 1950, 67 y.o.   MRN: [id number]  HPI Here for lab work and for advice. She is worried about some recent mild weight loss because she noticed her clothes are fitting a bit looser lately. In fact by looking at her chart I see she has lost 10 lbs over the past 5 months. She says she has not been trying to lose weight. However a few months ago we found her glucose and cholesterol to be a little high and we advised her to change her diet. She has done so, and I have no doubt this partly explains the weight loss. Also she has been under a lot of stress with family and other issues. She admits that her appetite has been quite decreased for about 6 months and she realizes she is not eating as much food as before. She has a tense relationship with her husband and this has her anxiety levels quite high. She is getting therapy with Dr. Irven Shelling, and this has been helpful.    Review of Systems  Constitutional: Positive for unexpected weight change.  Respiratory: Negative.   Cardiovascular: Negative.   Gastrointestinal: Negative.   Genitourinary: Negative.   Neurological: Negative.   Psychiatric/Behavioral: Positive for dysphoric mood. Negative for sleep disturbance. The patient is nervous/anxious.        Objective:   Physical Exam  Constitutional: She is oriented to person, place, and time. She appears well-developed and well-nourished.  Neck: No thyromegaly present.  Cardiovascular: Normal rate, regular rhythm, normal heart sounds and intact distal pulses.  Pulmonary/Chest: Effort normal and breath sounds normal.  Lymphadenopathy:    She has no cervical adenopathy.  Neurological: She is alert and oriented to person, place, and time.  Psychiatric: Her behavior is normal. Thought content normal.  Somewhat anxious           Assessment & Plan:  I am not overly concerned about her weight loss because there are obvious reasons  to explain it. I told her that if this continues we may have to investigate further, so she will let us know. She is fasting so we can recheck an A1c and a lipid panel. She will continue her therapy for the anxiety. I offered her medication today but she declined, saying she doesn't need this right now. She will follow up prn. We spent 45 minutes together today discussing these issues.  Alysia Penna, MD

## 2017-11-22 ENCOUNTER — Ambulatory Visit (INDEPENDENT_AMBULATORY_CARE_PROVIDER_SITE_OTHER): Payer: Managed Care, Other (non HMO) | Admitting: Psychology

## 2017-11-22 DIAGNOSIS — F4322 Adjustment disorder with anxiety: Secondary | ICD-10-CM | POA: Diagnosis not present

## 2017-11-27 ENCOUNTER — Telehealth: Payer: Self-pay | Admitting: *Deleted

## 2017-11-27 NOTE — Telephone Encounter (Signed)
Copied from [place] [id number]. Topic: General - Other >> Nov 19, 2017 11:39 AM Oneta Rack wrote: Relation to pt: self  Call back number: [phone]   Reason for call: Patient inquiring about lab results, please advise >> Nov 21, 2017  1:36 PM Elie Confer, CMA wrote: Pt was given lab results via my chart message.     Pt called to check on her lab results. She is aware of lab results and nothing further is needed.

## 2017-12-14 ENCOUNTER — Ambulatory Visit: Payer: Self-pay | Admitting: Family Medicine

## 2017-12-19 ENCOUNTER — Encounter: Payer: Self-pay | Admitting: Family Medicine

## 2017-12-19 ENCOUNTER — Ambulatory Visit (INDEPENDENT_AMBULATORY_CARE_PROVIDER_SITE_OTHER): Payer: Managed Care, Other (non HMO) | Admitting: Family Medicine

## 2017-12-19 VITALS — BP 122/78 | HR 78 | Temp 97.7°F | Wt 207.5 lb

## 2017-12-19 DIAGNOSIS — N390 Urinary tract infection, site not specified: Secondary | ICD-10-CM

## 2017-12-19 DIAGNOSIS — I6523 Occlusion and stenosis of bilateral carotid arteries: Secondary | ICD-10-CM

## 2017-12-19 LAB — POCT URINALYSIS DIPSTICK
Bilirubin, UA: NEGATIVE
Blood, UA: NEGATIVE
GLUCOSE UA: NEGATIVE
KETONES UA: NEGATIVE
Nitrite, UA: POSITIVE
Protein, UA: NEGATIVE
SPEC GRAV UA: 1.015 (ref 1.010–1.025)
UROBILINOGEN UA: 0.2 U/dL
pH, UA: 6.5 (ref 5.0–8.0)

## 2017-12-19 MED ORDER — NITROFURANTOIN MONOHYD MACRO 100 MG PO CAPS: 100.0000 mg | ORAL_CAPSULE | Freq: Two times a day (BID) | ORAL | 0 refills | Status: DC

## 2017-12-19 NOTE — Progress Notes (Signed)
   Subjective:    Patient ID: Megan Crane, female    DOB: 06/16/1950, 67 y.o.   MRN: [id number]  HPI Here for 6 days of lower abdominal pressure and urgency to urinate. No fever.    Review of Systems  Constitutional: Negative.   Respiratory: Negative.   Cardiovascular: Negative.   Gastrointestinal: Negative.   Genitourinary: Positive for dysuria, frequency and urgency.       Objective:   Physical Exam  Constitutional: She appears well-developed and well-nourished. No distress.  Neck: No thyromegaly present.  Cardiovascular: Normal rate, regular rhythm, normal heart sounds and intact distal pulses.  Pulmonary/Chest: Effort normal and breath sounds normal.  Abdominal: Soft. Normal appearance and bowel sounds are normal. She exhibits no distension and no mass. There is no tenderness. There is no rebound and no guarding.  Lymphadenopathy:    She has no cervical adenopathy.          Assessment & Plan:  UTI, treat with Macrobid. Culture the sample.  Alysia Penna, MD

## 2017-12-20 LAB — URINE CULTURE
MICRO NUMBER:: 91483999
SPECIMEN QUALITY:: ADEQUATE

## 2017-12-21 ENCOUNTER — Other Ambulatory Visit (INDEPENDENT_AMBULATORY_CARE_PROVIDER_SITE_OTHER): Payer: Managed Care, Other (non HMO)

## 2017-12-21 ENCOUNTER — Ambulatory Visit: Payer: Medicare Other | Admitting: Family Medicine

## 2017-12-21 DIAGNOSIS — R739 Hyperglycemia, unspecified: Secondary | ICD-10-CM

## 2017-12-21 LAB — HEMOGLOBIN A1C: HEMOGLOBIN A1C: 5.8 % (ref 4.6–6.5)

## 2017-12-24 ENCOUNTER — Telehealth: Payer: Self-pay | Admitting: *Deleted

## 2017-12-24 NOTE — Telephone Encounter (Signed)
Called and spoke with pt about her lab results.  She is requesting that since she is traveling over the holidays that she have a refill of the abx for the UTI.  She stated that she can feel when this is coming on and since she will be out of state over the holidays, she would feel better if she had this on hand.  Dr. Sarajane Jews please advise. Thanks

## 2017-12-25 ENCOUNTER — Telehealth: Payer: Self-pay

## 2017-12-25 NOTE — Telephone Encounter (Signed)
Copied from [place] [id number]. Topic: General - Other >> Dec 25, 2017  4:15 PM Carolyn Stare wrote:  Pt call to say she ask Dr Sarajane Jews CMA  to ask him if she can have a refill on the medication he gave her for the UTI. She said she still have the pressure when she walk and want to make sure it is cleared up before she gone on her trip    nitrofurantoin, macrocrystal-monohydrate, (MACROBID) 100 MG capsule  Kristopher Oppenheim

## 2017-12-25 NOTE — Telephone Encounter (Signed)
Dr. Sarajane Jews please advise on refill of macrobid.  Thanks

## 2017-12-26 NOTE — Telephone Encounter (Signed)
Okay, call in Macrobid 100 mg to take BID #20

## 2017-12-27 ENCOUNTER — Ambulatory Visit (INDEPENDENT_AMBULATORY_CARE_PROVIDER_SITE_OTHER): Payer: Managed Care, Other (non HMO) | Admitting: Family Medicine

## 2017-12-27 ENCOUNTER — Encounter: Payer: Self-pay | Admitting: Family Medicine

## 2017-12-27 VITALS — BP 122/78 | HR 85 | Temp 98.1°F | Wt 205.0 lb

## 2017-12-27 DIAGNOSIS — R300 Dysuria: Secondary | ICD-10-CM

## 2017-12-27 DIAGNOSIS — I6523 Occlusion and stenosis of bilateral carotid arteries: Secondary | ICD-10-CM

## 2017-12-27 LAB — POCT URINALYSIS DIPSTICK
BILIRUBIN UA: NEGATIVE
GLUCOSE UA: NEGATIVE
KETONES UA: NEGATIVE
Leukocytes, UA: NEGATIVE
Nitrite, UA: NEGATIVE
Protein, UA: NEGATIVE
RBC UA: NEGATIVE
SPEC GRAV UA: 1.01 (ref 1.010–1.025)
Urobilinogen, UA: 0.2 E.U./dL
pH, UA: 7 (ref 5.0–8.0)

## 2017-12-27 MED ORDER — NITROFURANTOIN MONOHYD MACRO 100 MG PO CAPS: 100.0000 mg | ORAL_CAPSULE | Freq: Two times a day (BID) | ORAL | 0 refills | Status: DC

## 2017-12-27 NOTE — Addendum Note (Signed)
Addended by: Elie Confer on: 12/27/2017 12:46 PM   Modules accepted: Orders

## 2017-12-27 NOTE — Progress Notes (Signed)
   Subjective:    Patient ID: Megan Crane, female    DOB: 1950-06-29, 67 y.o.   MRN: [id number]  HPI Here for intermittent lower abdominal cramps and urgency to urinate. No fever. She was here on 12-19-17 for these symptoms and she was given a week of Macrobid. This seemed to alleviate her symptoms for a time, but now they are back. Her urine culture from that day showed no growth. Her UA today is clear.    Review of Systems  Constitutional: Negative.   Respiratory: Negative.   Cardiovascular: Negative.   Gastrointestinal: Negative.   Genitourinary: Positive for dysuria, frequency, pelvic pain and urgency. Negative for hematuria.       Objective:   Physical Exam Constitutional:      Appearance: Normal appearance.  Cardiovascular:     Rate and Rhythm: Normal rate and regular rhythm.     Pulses: Normal pulses.     Heart sounds: Normal heart sounds.  Pulmonary:     Effort: Pulmonary effort is normal.     Breath sounds: Normal breath sounds.  Abdominal:     General: Abdomen is flat. Bowel sounds are normal. There is no distension.     Palpations: Abdomen is soft. There is no mass.     Tenderness: There is no abdominal tenderness. There is no right CVA tenderness, left CVA tenderness, guarding or rebound.     Hernia: No hernia is present.           Assessment & Plan:  She has recurrent urinary symptoms that suggest possible interstitial cystitis. We will refer her to Urology to evaluate further. I gave her a rx for Macrobid to hold on to, and she can start this over the weekend if her symptoms get worse.  Alysia Penna, MD

## 2017-12-27 NOTE — Telephone Encounter (Signed)
Completed.

## 2018-01-14 ENCOUNTER — Ambulatory Visit: Payer: Medicare Other | Admitting: Psychology

## 2018-01-25 ENCOUNTER — Ambulatory Visit (INDEPENDENT_AMBULATORY_CARE_PROVIDER_SITE_OTHER): Payer: Medicare Other | Admitting: Psychology

## 2018-01-25 DIAGNOSIS — F411 Generalized anxiety disorder: Secondary | ICD-10-CM | POA: Diagnosis not present

## 2018-01-28 ENCOUNTER — Other Ambulatory Visit: Payer: Self-pay | Admitting: Family Medicine

## 2018-02-13 DIAGNOSIS — H35022 Exudative retinopathy, left eye: Secondary | ICD-10-CM | POA: Diagnosis not present

## 2018-03-08 ENCOUNTER — Ambulatory Visit (INDEPENDENT_AMBULATORY_CARE_PROVIDER_SITE_OTHER): Payer: Medicare Other | Admitting: Psychology

## 2018-03-08 DIAGNOSIS — F411 Generalized anxiety disorder: Secondary | ICD-10-CM

## 2018-03-26 ENCOUNTER — Other Ambulatory Visit: Payer: Self-pay | Admitting: Family Medicine

## 2018-04-01 ENCOUNTER — Ambulatory Visit (INDEPENDENT_AMBULATORY_CARE_PROVIDER_SITE_OTHER): Payer: Medicare Other | Admitting: Psychology

## 2018-04-01 DIAGNOSIS — F411 Generalized anxiety disorder: Secondary | ICD-10-CM | POA: Diagnosis not present

## 2018-04-22 ENCOUNTER — Ambulatory Visit (INDEPENDENT_AMBULATORY_CARE_PROVIDER_SITE_OTHER): Payer: Medicare Other | Admitting: Psychology

## 2018-04-22 DIAGNOSIS — F411 Generalized anxiety disorder: Secondary | ICD-10-CM

## 2018-05-23 ENCOUNTER — Ambulatory Visit (INDEPENDENT_AMBULATORY_CARE_PROVIDER_SITE_OTHER): Payer: Medicare Other | Admitting: Psychology

## 2018-05-23 DIAGNOSIS — F411 Generalized anxiety disorder: Secondary | ICD-10-CM | POA: Diagnosis not present

## 2018-05-30 DIAGNOSIS — H35042 Retinal micro-aneurysms, unspecified, left eye: Secondary | ICD-10-CM | POA: Diagnosis not present

## 2018-05-30 DIAGNOSIS — H43813 Vitreous degeneration, bilateral: Secondary | ICD-10-CM | POA: Diagnosis not present

## 2018-05-30 DIAGNOSIS — H348122 Central retinal vein occlusion, left eye, stable: Secondary | ICD-10-CM | POA: Diagnosis not present

## 2018-05-30 DIAGNOSIS — H35033 Hypertensive retinopathy, bilateral: Secondary | ICD-10-CM | POA: Diagnosis not present

## 2018-06-24 ENCOUNTER — Ambulatory Visit: Payer: Self-pay | Admitting: Psychology

## 2018-07-22 ENCOUNTER — Ambulatory Visit (INDEPENDENT_AMBULATORY_CARE_PROVIDER_SITE_OTHER): Payer: Medicare Other | Admitting: Psychology

## 2018-07-22 DIAGNOSIS — F411 Generalized anxiety disorder: Secondary | ICD-10-CM | POA: Diagnosis not present

## 2018-07-24 ENCOUNTER — Ambulatory Visit: Payer: Self-pay | Admitting: *Deleted

## 2018-07-24 NOTE — Telephone Encounter (Signed)
Patient reports her veins are bothering her- she is seeing some redness and purplish coloring in her left foot (slight on right). Since her trip to the beach she feels like she has "bands" on bilateral legs. Patient states she has hand hands tingling for months.Patient thinks she is retaining fluid. Patient reports swelling up to mid calve. Call to office for appointment. Reason for Disposition . [1] MODERATE leg swelling (e.g., swelling extends up to knees) AND [2] new onset or worsening  Answer Assessment - Initial Assessment Questions 1. ONSET: "When did the swelling start?" (e.g., minutes, hours, days)     2 days 2. LOCATION: "What part of the leg is swollen?"  "Are both legs swollen or just one leg?"     Bilateral- midcalf- left is worse 3. SEVERITY: "How bad is the swelling?" (e.g., localized; mild, moderate, severe)  - Localized - small area of swelling localized to one leg  - MILD pedal edema - swelling limited to foot and ankle, pitting edema < 1/4 inch (6 mm) deep, rest and elevation eliminate most or all swelling  - MODERATE edema - swelling of lower leg to knee, pitting edema > 1/4 inch (6 mm) deep, rest and elevation only partially reduce swelling  - SEVERE edema - swelling extends above knee, facial or hand swelling present      Pitting- moderate 4. REDNESS: "Does the swelling look red or infected?"     Redness at feet- legs slightly speckled 5. PAIN: "Is the swelling painful to touch?" If so, ask: "How painful is it?"   (Scale 1-10; mild, moderate or severe)     No pain 6. FEVER: "Do you have a fever?" If so, ask: "What is it, how was it measured, and when did it start?"      No fever 7. CAUSE: "What do you think is causing the leg swelling?"     fluid retention 8. MEDICAL HISTORY: "Do you have a history of heart failure, kidney disease, liver failure, or cancer?"     no 9. RECURRENT SYMPTOM: "Have you had leg swelling before?" If so, ask: "When was the last time?" "What  happened that time?"     Yes- not like this - this is different not as aggressive/obvious 10. OTHER SYMPTOMS: "Do you have any other symptoms?" (e.g., chest pain, difficulty breathing)       no 11. PREGNANCY: "Is there any chance you are pregnant?" "When was your last menstrual period?"       n/a  Protocols used: LEG SWELLING AND EDEMA-A-AH

## 2018-07-24 NOTE — Telephone Encounter (Signed)
Pt scheduled for office visit

## 2018-07-25 ENCOUNTER — Encounter: Payer: Self-pay | Admitting: Family Medicine

## 2018-07-25 ENCOUNTER — Ambulatory Visit (INDEPENDENT_AMBULATORY_CARE_PROVIDER_SITE_OTHER): Payer: Medicare Other | Admitting: Family Medicine

## 2018-07-25 ENCOUNTER — Other Ambulatory Visit: Payer: Self-pay

## 2018-07-25 VITALS — BP 112/78 | HR 97 | Temp 99.0°F | Wt 214.2 lb

## 2018-07-25 DIAGNOSIS — R609 Edema, unspecified: Secondary | ICD-10-CM | POA: Diagnosis not present

## 2018-07-25 DIAGNOSIS — I8393 Asymptomatic varicose veins of bilateral lower extremities: Secondary | ICD-10-CM | POA: Diagnosis not present

## 2018-07-25 DIAGNOSIS — I10 Essential (primary) hypertension: Secondary | ICD-10-CM

## 2018-07-25 DIAGNOSIS — R32 Unspecified urinary incontinence: Secondary | ICD-10-CM

## 2018-07-25 MED ORDER — SOLIFENACIN SUCCINATE 5 MG PO TABS: 5.0000 mg | ORAL_TABLET | Freq: Every day | ORAL | 3 refills | Status: DC

## 2018-07-25 MED ORDER — FUROSEMIDE 20 MG PO TABS: 20.0000 mg | ORAL_TABLET | Freq: Every day | ORAL | 3 refills | Status: DC | PRN

## 2018-07-25 NOTE — Progress Notes (Signed)
   Subjective:    Patient ID: Megan Crane, female    DOB: 1950-03-21, 68 y.o.   MRN: [id number]  HPI Here for several issues. 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 There is no pain, no SOB. Also she had tried Vesicare in the past for OAB and thenshe stopped it. The OAB is acting up again and she asks to try this again. Third her varicose veins have been more visible in the left leg for a few weeks, although there is no pain.    Review of Systems  Constitutional: Negative.   Respiratory: Negative.   Cardiovascular: Positive for leg swelling. Negative for chest pain and palpitations.  Gastrointestinal: Negative.   Genitourinary: Positive for frequency. Negative for dysuria.       Objective:   Physical Exam Constitutional:      Appearance: Normal appearance.  Cardiovascular:     Rate and Rhythm: Normal rate and regular rhythm.     Pulses: Normal pulses.     Heart sounds: Normal heart sounds.  Pulmonary:     Effort: Pulmonary effort is normal. No respiratory distress.     Breath sounds: Normal breath sounds. No stridor. No wheezing, rhonchi or rales.  Musculoskeletal:     Comments: Both lower legs have 1+ edema   Skin:    Comments: The left leg has numerous varicose veins, from small ones to larger ones. These are not tender, no cords are felt   Neurological:     Mental Status: She is alert.           Assessment & Plan:  Leg edema, possibly exacerbated by prolonged sitting. She will try to take breaks to get up and walk around frequently. She may wear compression stockings. She will try Lasix 20 mg daily as needed. The left leg has varicose veins, and since they are no symptomatic we agreed to observe these only for now. For the OAB try Vesicare 5 mg daily.  Alysia Penna, MD

## 2018-07-28 ENCOUNTER — Other Ambulatory Visit: Payer: Self-pay | Admitting: Family Medicine

## 2018-07-30 ENCOUNTER — Ambulatory Visit: Payer: Medicare Other | Admitting: Psychology

## 2018-08-19 ENCOUNTER — Other Ambulatory Visit: Payer: Self-pay | Admitting: Family Medicine

## 2018-08-27 ENCOUNTER — Telehealth: Payer: Self-pay | Admitting: Family Medicine

## 2018-08-27 NOTE — Telephone Encounter (Signed)
Patient requesting nitrofurantoin, macrocrystal-monohydrate, (MACROBID) 100 MG capsule due to possible ? Bacteria infection  [place], [place] [address] [phone] (Phone) [phone] (Fax)

## 2018-08-27 NOTE — Telephone Encounter (Signed)
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  She is having a flare-up right now so has started taking Nitrofurantoin Mono-MCR, 100 mg 2 times daily that Dr Sarajane Jews had previously wrote a prescription for.    Patient is requesting a call because she is getting ready to go out of town on Thursday.

## 2018-08-27 NOTE — Telephone Encounter (Signed)
Please advise. Should the patient continue this medication? 

## 2018-08-27 NOTE — Telephone Encounter (Signed)
Duplicate message. Please see other phone encounter from 08/27/2018 for further documentation.

## 2018-08-28 NOTE — Telephone Encounter (Signed)
Yes take it for at least 3 days

## 2018-08-28 NOTE — Telephone Encounter (Signed)
Patient has been taking the medication BID since Friday.  Patient complains of pain in the pubic area and across the stomach. Headache. Aching pain when walking. Complains of gas. soft stools.  She stated that she has started to feel better.  Patient has enough medication through Sunday. She will call us Monday if she is still feeling bad.  Will send to Dr. Sarajane Jews as Juluis Rainier.

## 2018-09-02 ENCOUNTER — Ambulatory Visit: Payer: Self-pay | Admitting: Psychology

## 2018-09-02 ENCOUNTER — Telehealth: Payer: Self-pay | Admitting: Family Medicine

## 2018-09-02 NOTE — Telephone Encounter (Signed)
Patient requesting call back from [place].

## 2018-09-02 NOTE — Telephone Encounter (Signed)
Copied from [place]. Topic: Referral - Request for Referral >> Sep 02, 2018  1:55 PM Leward Quan A wrote: Has patient seen PCP for this complaint? Yes.   *If NO, is insurance requiring patient see PCP for this issue before PCP can refer them? Referral for which specialty: GYN Preferred provider/office: [facility] Reason for referral: Dropped bladder pain in pelvic area

## 2018-09-02 NOTE — Telephone Encounter (Signed)
Please advise. See other phone note from 09/02/2018 as well.

## 2018-09-02 NOTE — Telephone Encounter (Signed)
Until she sees Dr. Zigmund Daniel I suggest we increase the Vesicare to 10 mg daily. She can take 2 pills of the 5 mg a day if she wishes. If needed, call in #30 with 2 rf of the 10 mg

## 2018-09-02 NOTE — Telephone Encounter (Signed)
°  Relation to pt: self Call back number: [phone] call after 2pm   Reason for call:  Patient would like to follow up with PCP regarding nitrofurantoin, macrocrystal-monohydrate, (MACROBID) 100 MG capsule relieves achness but would like referral sent to Dr. Flint Melter. Megan Crane G[facility] due to drop bladder. Patient requesting to speak PCP nurse

## 2018-09-02 NOTE — Telephone Encounter (Signed)
Patient calling back to state that she will no longer need this referral as she was previously established with Dr. Zigmund Daniel.

## 2018-09-02 NOTE — Telephone Encounter (Signed)
Please advise. See other message  From 09/02/2018 as well.

## 2018-09-02 NOTE — Telephone Encounter (Signed)
Spoke with patient. She is aware to take 2 of the 5mg  daily. She will call when she needs a refill.  Patient would like to know if this procedure will help with the inflammation around her bladder.

## 2018-09-02 NOTE — Telephone Encounter (Signed)
Patient is wondering if getting bladder tacked up will helping her bladder condition.  She goes in October to be checked for it.    Patient wants to know if the Vesicare is going to help the achiness she is having?  Pt wants to know if Dr Sarajane Jews wants to call anything else in to help make it through til October 14th with Dr Zigmund Daniel?

## 2018-09-03 ENCOUNTER — Ambulatory Visit (INDEPENDENT_AMBULATORY_CARE_PROVIDER_SITE_OTHER): Payer: Medicare Other | Admitting: Psychology

## 2018-09-03 DIAGNOSIS — F411 Generalized anxiety disorder: Secondary | ICD-10-CM

## 2018-09-03 NOTE — Telephone Encounter (Signed)
I hope so

## 2018-09-03 NOTE — Telephone Encounter (Signed)
Spoke with patient. She is aware of notes per Dr. Sarajane Jews. Nothing further needed.

## 2018-09-10 ENCOUNTER — Other Ambulatory Visit: Payer: Self-pay

## 2018-09-10 ENCOUNTER — Ambulatory Visit (INDEPENDENT_AMBULATORY_CARE_PROVIDER_SITE_OTHER): Payer: Medicare Other

## 2018-09-10 DIAGNOSIS — Z23 Encounter for immunization: Secondary | ICD-10-CM | POA: Diagnosis not present

## 2018-09-30 ENCOUNTER — Ambulatory Visit (INDEPENDENT_AMBULATORY_CARE_PROVIDER_SITE_OTHER): Payer: Medicare Other | Admitting: Psychology

## 2018-09-30 DIAGNOSIS — F411 Generalized anxiety disorder: Secondary | ICD-10-CM | POA: Diagnosis not present

## 2018-10-02 ENCOUNTER — Encounter: Payer: Self-pay | Admitting: Family Medicine

## 2018-10-02 ENCOUNTER — Ambulatory Visit (INDEPENDENT_AMBULATORY_CARE_PROVIDER_SITE_OTHER): Payer: Medicare Other | Admitting: Family Medicine

## 2018-10-02 ENCOUNTER — Other Ambulatory Visit: Payer: Self-pay

## 2018-10-02 VITALS — BP 110/80 | HR 95 | Temp 98.3°F | Ht 66.0 in | Wt 214.0 lb

## 2018-10-02 DIAGNOSIS — Z23 Encounter for immunization: Secondary | ICD-10-CM | POA: Diagnosis not present

## 2018-10-02 DIAGNOSIS — R300 Dysuria: Secondary | ICD-10-CM | POA: Diagnosis not present

## 2018-10-02 DIAGNOSIS — N3281 Overactive bladder: Secondary | ICD-10-CM | POA: Diagnosis not present

## 2018-10-02 LAB — POCT URINALYSIS DIPSTICK
Bilirubin, UA: NEGATIVE
Blood, UA: NEGATIVE
Glucose, UA: NEGATIVE
Ketones, UA: NEGATIVE
Leukocytes, UA: NEGATIVE
Nitrite, UA: NEGATIVE
Protein, UA: NEGATIVE
Spec Grav, UA: 1.025 (ref 1.010–1.025)
Urobilinogen, UA: 0.2 E.U./dL
pH, UA: 6 (ref 5.0–8.0)

## 2018-10-02 NOTE — Progress Notes (Signed)
   Subjective:    Patient ID: Megan Crane, female    DOB: 1950/08/19, 68 y.o.   MRN: [id number]  HPI Here to check for a possible UTI. For the past few days she has had increased urgency to urinate, but no pain or burning. No fever. She is scheduled to see Dr. [place] Pink [facility] [place] in the next few weeks. Her BMs are regular.    Review of Systems  Constitutional: Negative.   Respiratory: Negative.   Cardiovascular: Negative.   Gastrointestinal: Negative.   Genitourinary: Positive for frequency and urgency. Negative for dysuria, flank pain, hematuria and pelvic pain.       Objective:   Physical Exam Constitutional:      Appearance: Normal appearance.  Cardiovascular:     Rate and Rhythm: Normal rate and regular rhythm.     Pulses: Normal pulses.     Heart sounds: Normal heart sounds.  Pulmonary:     Effort: Pulmonary effort is normal.     Breath sounds: Normal breath sounds.  Abdominal:     General: Abdomen is flat. Bowel sounds are normal. There is no distension.     Palpations: Abdomen is soft. There is no mass.     Tenderness: There is no abdominal tenderness. There is no guarding or rebound.     Hernia: No hernia is present.  Neurological:     Mental Status: She is alert.           Assessment & Plan:  No signs of a UTI. She likely has some OAB. She will see Dr. Zigmund Daniel as above.  Alysia Penna, MD

## 2018-10-16 ENCOUNTER — Encounter: Payer: Self-pay | Admitting: Family Medicine

## 2018-10-16 ENCOUNTER — Other Ambulatory Visit: Payer: Self-pay

## 2018-10-16 ENCOUNTER — Telehealth (INDEPENDENT_AMBULATORY_CARE_PROVIDER_SITE_OTHER): Payer: Medicare Other | Admitting: Family Medicine

## 2018-10-16 DIAGNOSIS — R05 Cough: Secondary | ICD-10-CM

## 2018-10-16 DIAGNOSIS — R6889 Other general symptoms and signs: Secondary | ICD-10-CM

## 2018-10-16 DIAGNOSIS — Z20828 Contact with and (suspected) exposure to other viral communicable diseases: Secondary | ICD-10-CM | POA: Diagnosis not present

## 2018-10-16 DIAGNOSIS — R0982 Postnasal drip: Secondary | ICD-10-CM | POA: Diagnosis not present

## 2018-10-16 NOTE — Progress Notes (Signed)
Virtual Visit via Video Note  I connected with the patient on 10/16/18 at  1:30 PM EDT by a video enabled telemedicine application and verified that I am speaking with the correct person using two identifiers.  Location patient: home Location provider:work or home office Persons participating in the virtual visit: patient, provider  I discussed the limitations of evaluation and management by telemedicine and the availability of in person appointments. The patient expressed understanding and agreed to proceed.   HPI: Here to ask about Covid testing. For the past week she has had a stuffy head, PND, and a dry cough. No fever or SOB or body aches or NVD. Normally she would think this is simply due to her fall allergies. However she spent a day last week with a close friend in her home, and they did not wear masks. Now her friend has informed her that she has been sick for a few days with body aches, a cough, and a fever.    ROS: See pertinent positives and negatives per HPI.  Past Medical History:  Diagnosis Date  . Anxiety disorder    hx vasovagal responses  . Headache(784.0)   . History of kidney stones   . Hydronephrosis, right   . Hyperlipidemia   . Hypertension   . Nephrolithiasis   . Positive nasal culture for methicillin resistant Staphylococcus aureus   . Right ureteral stone   . Urinary incontinence    sees Dr. McDiarmid     Past Surgical History:  Procedure Laterality Date  . COLONOSCOPY  12/28/2015   per Dr. Havery Moros, serrated polyps and diverticula, repeat in 3 yrs   . CYSTOSCOPY  06/09/2011   Procedure: CYSTOSCOPY;  Surgeon: Reece Packer, MD;  Location: [facility];  Service: Urology;  Laterality: N/A;  . CYSTOSCOPY W/ URETERAL STENT PLACEMENT  02/03/2014   Procedure: CYSTOSCOPY WITH  RIGHT RETROGRADE PYELOGRAM/ RIGHT URETERAL STENT PLACEMENT;  Surgeon: Malka So, MD;  Location: [facility];  Service: Urology;;  . Consuela Mimes WITH BIOPSY  02/03/2014    Procedure: CYSTOSCOPY WITH BIOPSY;  Surgeon: Malka So, MD;  Location: [facility];  Service: Urology;;  . PILONIDAL CYST EXCISION  1974  . RECTOCELE REPAIR  06/09/2011   Procedure: POSTERIOR REPAIR (RECTOCELE);  Surgeon: Reece Packer, MD;  Location: [facility];  Service: Urology;  Laterality: N/A;  Xenform graft 6x10  . RIGHT URETEROSCOPIC STONE EXTRACTION / STENT PLACEMENT  03-13-2000  . URETEROSCOPY Right 02/03/2014   Procedure: URETEROSCOPY WITH MANAGEMENT OF URETERAL STRICTURE;  Surgeon: Malka So, MD;  Location: [facility];  Service: Urology;  Laterality: Right;  Marland Kitchen VAGINAL HYSTERECTOMY  06/09/2011   Procedure: HYSTERECTOMY VAGINAL;  Surgeon: Sharene Butters, MD;  Location: [facility];  Service: Gynecology;  Laterality: N/A;  . VAGINAL PROLAPSE REPAIR  06/09/2011   Procedure: VAGINAL VAULT SUSPENSION;  Surgeon: Reece Packer, MD;  Location: [facility];  Service: Urology;  Laterality: N/A;  . VEIN LIGATION AND STRIPPING     LEFT LEG    Family History  Problem Relation Age of Onset  . Cancer Father        kidney, bladder   . Coronary artery disease Other   . Diabetes Other   . Hyperlipidemia Other   . Kidney disease Other   . Cancer Other        lung  . Colon cancer Mother 26     Current Outpatient Medications:  .  aspirin (ASPIR-LOW)  81 MG EC tablet, Take 81 mg by mouth daily. Swallow whole., Disp: , Rfl:  .  furosemide (LASIX) 20 MG tablet, Take 1 tablet (20 mg total) by mouth daily as needed for fluid., Disp: 90 tablet, Rfl: 3 .  hydrochlorothiazide (HYDRODIURIL) 25 MG tablet, TAKE ONE TABLET BY MOUTH DAILY, Disp: 90 tablet, Rfl: 0 .  mupirocin nasal ointment (BACTROBAN NASAL) 2 %, Place 1 application into the nose 2 (two) times daily., Disp: 10 g, Rfl: 2 .  nitrofurantoin, macrocrystal-monohydrate, (MACROBID) 100 MG capsule, Take 1 capsule (100 mg total) by mouth 2 (two) times daily., Disp: 20 capsule, Rfl: 0 .  NON FORMULARY, , Disp: ,  Rfl:  .  omeprazole (PRILOSEC) 40 MG capsule, Take 1 capsule (40 mg total) by mouth daily., Disp: 30 capsule, Rfl: 3 .  solifenacin (VESICARE) 5 MG tablet, Take 1 tablet (5 mg total) by mouth daily., Disp: 90 tablet, Rfl: 3 .  valsartan (DIOVAN) 40 MG tablet, TAKE ONE TABLET BY MOUTH DAILY . APPT. NEEDED FOR MORE REFILLS., Disp: 90 tablet, Rfl: 0  Current Facility-Administered Medications:  .  0.9 %  sodium chloride infusion, 500 mL, Intravenous, Continuous, Armbruster, Carlota Raspberry, MD  EXAM:  VITALS per patient if applicable:  GENERAL: alert, oriented, appears well and in no acute distress  HEENT: atraumatic, conjunttiva clear, no obvious abnormalities on inspection of external nose and ears  NECK: normal movements of the head and neck  LUNGS: on inspection no signs of respiratory distress, breathing rate appears normal, no obvious gross SOB, gasping or wheezing  CV: no obvious cyanosis  MS: moves all visible extremities without noticeable abnormality  PSYCH/NEURO: pleasant and cooperative, no obvious depression or anxiety, speech and thought processing grossly intact  ASSESSMENT AND PLAN: Her symptoms may well be due to allergies, but I advised her to get tested for the Covid-19 virus. She agreed, and she will drive to the [facility] testing site now.  Alysia Penna, MD  Discussed the following assessment and plan:  No diagnosis found.     I discussed the assessment and treatment plan with the patient. The patient was provided an opportunity to ask questions and all were answered. The patient agreed with the plan and demonstrated an understanding of the instructions.   The patient was advised to call back or seek an in-person evaluation if the symptoms worsen or if the condition fails to improve as anticipated.

## 2018-10-17 ENCOUNTER — Other Ambulatory Visit: Payer: Self-pay

## 2018-10-17 DIAGNOSIS — Z20822 Contact with and (suspected) exposure to covid-19: Secondary | ICD-10-CM

## 2018-10-17 DIAGNOSIS — Z20828 Contact with and (suspected) exposure to other viral communicable diseases: Secondary | ICD-10-CM | POA: Diagnosis not present

## 2018-10-18 LAB — NOVEL CORONAVIRUS, NAA: SARS-CoV-2, NAA: NOT DETECTED

## 2018-10-21 ENCOUNTER — Telehealth: Payer: Self-pay | Admitting: General Practice

## 2018-10-21 NOTE — Telephone Encounter (Signed)
Gave patient negative covid test results Patient understood 

## 2018-10-23 DIAGNOSIS — N8189 Other female genital prolapse: Secondary | ICD-10-CM | POA: Diagnosis not present

## 2018-10-25 ENCOUNTER — Other Ambulatory Visit: Payer: Self-pay | Admitting: Family Medicine

## 2018-10-25 DIAGNOSIS — Z1231 Encounter for screening mammogram for malignant neoplasm of breast: Secondary | ICD-10-CM | POA: Diagnosis not present

## 2018-10-28 ENCOUNTER — Ambulatory Visit (INDEPENDENT_AMBULATORY_CARE_PROVIDER_SITE_OTHER): Payer: Medicare Other | Admitting: Psychology

## 2018-10-28 DIAGNOSIS — F411 Generalized anxiety disorder: Secondary | ICD-10-CM | POA: Diagnosis not present

## 2018-10-29 ENCOUNTER — Telehealth (INDEPENDENT_AMBULATORY_CARE_PROVIDER_SITE_OTHER): Payer: Medicare Other | Admitting: Family Medicine

## 2018-10-29 ENCOUNTER — Encounter: Payer: Self-pay | Admitting: Family Medicine

## 2018-10-29 ENCOUNTER — Other Ambulatory Visit: Payer: Self-pay

## 2018-10-29 DIAGNOSIS — H938X1 Other specified disorders of right ear: Secondary | ICD-10-CM

## 2018-10-29 NOTE — Progress Notes (Signed)
Virtual Visit via Video Note  I connected with Megan Crane  on 10/29/18 at  4:40 PM EDT by a video enabled telemedicine application and verified that I am speaking with the correct person using two identifiers.  Location patient: home Location provider:work or home office Persons participating in the virtual visit: patient, provider  I discussed the limitations of evaluation and management by telemedicine and the availability of in person appointments. The patient expressed understanding and agreed to proceed.   HPI:   Acute visit for R ear issues: -reports chronic sinus issues allergies -used qtip in the R ear for wax a few days ago as feels stopped up -doesn't hurts, but feels stopped up, pops sometimes when opens jaw -has had this before, she sometimes has this with allergies, also has wax issues sometimes and a specialist once removed wax out of ears -she has constant sinus drainage - at baseline right  -no medications currently for her allergies BP 122/66  ROS: See pertinent positives and negatives per HPI.  Past Medical History:  Diagnosis Date  . Anxiety disorder    hx vasovagal responses  . Headache(784.0)   . History of kidney stones   . Hydronephrosis, right   . Hyperlipidemia   . Hypertension   . Nephrolithiasis   . Positive nasal culture for methicillin resistant Staphylococcus aureus   . Right ureteral stone   . Urinary incontinence    sees Dr. McDiarmid     Past Surgical History:  Procedure Laterality Date  . COLONOSCOPY  12/28/2015   per Dr. Havery Moros, serrated polyps and diverticula, repeat in 3 yrs   . CYSTOSCOPY  06/09/2011   Procedure: CYSTOSCOPY;  Surgeon: Reece Packer, MD;  Location: [facility];  Service: Urology;  Laterality: N/A;  . CYSTOSCOPY W/ URETERAL STENT PLACEMENT  02/03/2014   Procedure: CYSTOSCOPY WITH  RIGHT RETROGRADE PYELOGRAM/ RIGHT URETERAL STENT PLACEMENT;  Surgeon: Malka So, MD;  Location: [facility];  Service:  Urology;;  . Consuela Mimes WITH BIOPSY  02/03/2014   Procedure: CYSTOSCOPY WITH BIOPSY;  Surgeon: Malka So, MD;  Location: [facility];  Service: Urology;;  . PILONIDAL CYST EXCISION  1974  . RECTOCELE REPAIR  06/09/2011   Procedure: POSTERIOR REPAIR (RECTOCELE);  Surgeon: Reece Packer, MD;  Location: [facility];  Service: Urology;  Laterality: N/A;  Xenform graft 6x10  . RIGHT URETEROSCOPIC STONE EXTRACTION / STENT PLACEMENT  03-13-2000  . URETEROSCOPY Right 02/03/2014   Procedure: URETEROSCOPY WITH MANAGEMENT OF URETERAL STRICTURE;  Surgeon: Malka So, MD;  Location: [facility];  Service: Urology;  Laterality: Right;  Marland Kitchen VAGINAL HYSTERECTOMY  06/09/2011   Procedure: HYSTERECTOMY VAGINAL;  Surgeon: Sharene Butters, MD;  Location: [facility];  Service: Gynecology;  Laterality: N/A;  . VAGINAL PROLAPSE REPAIR  06/09/2011   Procedure: VAGINAL VAULT SUSPENSION;  Surgeon: Reece Packer, MD;  Location: [facility];  Service: Urology;  Laterality: N/A;  . VEIN LIGATION AND STRIPPING     LEFT LEG    Family History  Problem Relation Age of Onset  . Cancer Father        kidney, bladder   . Coronary artery disease Other   . Diabetes Other   . Hyperlipidemia Other   . Kidney disease Other   . Cancer Other        lung  . Colon cancer Mother 63    SOCIAL HX: see hpi   Current Outpatient Medications:  .  aspirin (ASPIR-LOW)  81 MG EC tablet, Take 81 mg by mouth daily. Swallow whole., Disp: , Rfl:  .  furosemide (LASIX) 20 MG tablet, Take 1 tablet (20 mg total) by mouth daily as needed for fluid., Disp: 90 tablet, Rfl: 3 .  hydrochlorothiazide (HYDRODIURIL) 25 MG tablet, TAKE ONE TABLET BY MOUTH DAILY, Disp: 90 tablet, Rfl: 0 .  mupirocin nasal ointment (BACTROBAN NASAL) 2 %, Place 1 application into the nose 2 (two) times daily., Disp: 10 g, Rfl: 2 .  nitrofurantoin, macrocrystal-monohydrate, (MACROBID) 100 MG capsule, Take 1 capsule (100 mg total) by mouth 2 (two)  times daily., Disp: 20 capsule, Rfl: 0 .  NON FORMULARY, , Disp: , Rfl:  .  omeprazole (PRILOSEC) 40 MG capsule, Take 1 capsule (40 mg total) by mouth daily., Disp: 30 capsule, Rfl: 3 .  solifenacin (VESICARE) 5 MG tablet, Take 1 tablet (5 mg total) by mouth daily., Disp: 90 tablet, Rfl: 3 .  valsartan (DIOVAN) 40 MG tablet, TAKE ONE TABLET BY MOUTH DAILY . APPT. NEEDED FOR MORE REFILLS., Disp: 90 tablet, Rfl: 0  Current Facility-Administered Medications:  .  0.9 %  sodium chloride infusion, 500 mL, Intravenous, Continuous, Armbruster, Carlota Raspberry, MD  EXAM:  VITALS per patient if applicable: see hpi  GENERAL: alert, oriented, appears well and in no acute distress  HEENT: atraumatic, conjunttiva clear, no obvious abnormalities on inspection of external nose and ears  NECK: normal movements of the head and neck  LUNGS: on inspection no signs of respiratory distress, breathing rate appears normal, no obvious gross SOB, gasping or wheezing, moist mucus membranes - was not able to visualize the posterior oropharynx due to poor lighting on patient end, neg ear pull tragus test, no gross drainage or bleeding form the ear  CV: no obvious cyanosis  MS: moves all visible extremities without noticeable abnormality  PSYCH/NEURO: pleasant and cooperative, no obvious depression or anxiety, speech and thought processing grossly intact  ASSESSMENT AND PLAN:  Discussed the following assessment and plan:  Ear fullness, right  -we discussed possible serious and likely etiologies, options for evaluation and workup, limitations of telemedicine visit vs in person visit, treatment, treatment risks and precautions. Pt prefers to treat via telemedicine empirically rather then risking or undertaking an in person visit at this moment. Query ETD vs cerumen impaction as most likely vs other. She opted to try 2 weeks of flonase and claritin. Patient agrees to seek prompt in person care to further evaluate if  worsening, new symptoms arise, or if is not improving with treatment.   I discussed the assessment and treatment plan with the patient. The patient was provided an opportunity to ask questions and all were answered. The patient agreed with the plan and demonstrated an understanding of the instructions.   The patient was advised to call back or seek an in-person evaluation if the symptoms worsen or if the condition fails to improve as anticipated.   Lucretia Kern, DO

## 2018-10-29 NOTE — Patient Instructions (Signed)
-  flonase 2 sprays each nostril daily for 2 weeks  -claritin once daily  -no qtips in the ears  -I hope you are feeling better soon! Seek care promptly if your symptoms worsen, new concerns arise or you are not improving with treatment.

## 2018-10-31 ENCOUNTER — Other Ambulatory Visit: Payer: Self-pay

## 2018-10-31 ENCOUNTER — Ambulatory Visit (INDEPENDENT_AMBULATORY_CARE_PROVIDER_SITE_OTHER): Payer: Medicare Other | Admitting: Family Medicine

## 2018-10-31 ENCOUNTER — Encounter: Payer: Self-pay | Admitting: Family Medicine

## 2018-10-31 ENCOUNTER — Telehealth: Payer: Self-pay

## 2018-10-31 VITALS — BP 108/70 | HR 84 | Temp 98.7°F | Wt 213.0 lb

## 2018-10-31 DIAGNOSIS — H6981 Other specified disorders of Eustachian tube, right ear: Secondary | ICD-10-CM

## 2018-10-31 DIAGNOSIS — H6121 Impacted cerumen, right ear: Secondary | ICD-10-CM

## 2018-10-31 MED ORDER — CETIRIZINE HCL 10 MG PO TABS: 10.0000 mg | ORAL_TABLET | Freq: Every day | ORAL | 0 refills | Status: DC

## 2018-10-31 MED ORDER — FLUTICASONE PROPIONATE 50 MCG/ACT NA SUSP: 1.0000 | Freq: Every day | NASAL | 0 refills | Status: DC

## 2018-10-31 NOTE — Telephone Encounter (Signed)
I called the pt and informed her of the message below.  Patient declined a virtual visit and an appt was scheduled for today at 2pm with Dr Volanda Napoleon.

## 2018-10-31 NOTE — Telephone Encounter (Signed)
Does she have time today that we could talk or do a video visit? Had recommended may need in person visit if worsening or not improving - but happy to talk to her if she prefers.

## 2018-10-31 NOTE — Progress Notes (Signed)
Subjective:    Patient ID: Megan Crane, female    DOB: Aug 29, 1950, 68 y.o.   MRN: [id number]  No chief complaint on file.   HPI Patient was seen today for acute concern/ f/u on R ear.  Pt followed by Dr. Sarajane Jews.  Seen virtually on 10/20 by Dr. Maudie Mercury for ear fullness.  Pt endorses continued symptoms.  States ear feels muffled and skin feels full in front of ear and in right neck.  Pt states symptoms started Monday after she washed her hair in the sink.  Pt also endorses right-sided nasal drainage.  Pt has not tried anything for her symptoms.    Patient inquires if her right knee appears swollen.  Patient states in the past irrigation of ears caused syncope.  Past Medical History:  Diagnosis Date  . Anxiety disorder    hx vasovagal responses  . Headache(784.0)   . History of kidney stones   . Hydronephrosis, right   . Hyperlipidemia   . Hypertension   . Nephrolithiasis   . Positive nasal culture for methicillin resistant Staphylococcus aureus   . Right ureteral stone   . Urinary incontinence    sees Dr. McDiarmid     Allergies  Allergen Reactions  . Levofloxacin Itching and Other (See Comments)    disoriented  . Lidocaine Other (See Comments)    "Eyes crossed"  . Percodan [Oxycodone-Aspirin] Other (See Comments)    "VERY DIZZY"    ROS General: Denies fever, chills, night sweats, changes in weight, changes in appetite HEENT: Denies headaches, ear pain, changes in vision, rhinorrhea, sore throat  + muffled right ear CV: Denies CP, palpitations, SOB, orthopnea Pulm: Denies SOB, cough, wheezing GI: Denies abdominal pain, nausea, vomiting, diarrhea, constipation GU: Denies dysuria, hematuria, frequency, vaginal discharge Msk: Denies muscle cramps, joint pains Neuro: Denies weakness, numbness, tingling Skin: Denies rashes, bruising Psych: Denies depression, anxiety, hallucinations    Objective:    Blood pressure 108/70, pulse 84, temperature 98.7 F (37.1 C), temperature  source Oral, weight 213 lb (96.6 kg), SpO2 98 %.   Gen. Pleasant, well-nourished, in no distress, normal affect  HEENT: [place]/AT, face symmetric, conjunctiva clear, no scleral icterus, PERRLA, EOMI, nares patent without drainage, pharynx without erythema or exudate.  Right canal with cerumen.  Right TM normal after cerumen impaction removal with curette.  Left TM normal.  no cervical lymphadenopathy. Lungs: no accessory muscle use MSK; left lower extremity slightly larger than right.  FROM.  No deformities. Cardiovascular: RRR, no peripheral edema Neuro:  A&Ox3, CN II-XII intact, normal gait  Wt Readings from Last 3 Encounters:  10/31/18 213 lb (96.6 kg)  10/02/18 214 lb (97.1 kg)  07/25/18 214 lb 3.2 oz (97.2 kg)    Lab Results  Component Value Date   WBC 5.3 07/06/2017   HGB 13.4 07/06/2017   HCT 40.0 07/06/2017   PLT 193.0 07/06/2017   GLUCOSE 95 07/06/2017   CHOL 220 (H) 11/09/2017   TRIG 150.0 (H) 11/09/2017   HDL 44.10 11/09/2017   LDLDIRECT 150.0 12/02/2012   LDLCALC 146 (H) 11/09/2017   ALT 21 07/06/2017   AST 19 07/06/2017   NA 140 07/06/2017   K 4.7 07/06/2017   CL 103 07/06/2017   CREATININE 0.82 07/06/2017   BUN 20 07/06/2017   CO2 30 07/06/2017   TSH 1.46 07/06/2017   INR 0.94 05/31/2011   HGBA1C 5.8 12/21/2017    Assessment/Plan:  Dysfunction of right eustachian tube  -Given handout - Plan: fluticasone (FLONASE)  50 MCG/ACT nasal spray, cetirizine (ZYRTEC) 10 MG tablet  Impacted cerumen of right ear -Consent obtained.  Right ear canal cleaned with curette as patient has not tolerated irrigation in the past.  Patient tolerated procedure well.  Right TM normal in appearance -Given handout  F/u as needed  Grier Mitts, MD

## 2018-10-31 NOTE — Telephone Encounter (Signed)
Copied from [place] [id number]. Topic: General - Inquiry >> Oct 31, 2018 10:34 AM Percell Belt A wrote: Reason for CRM: pt called in and stated that her ear is not any better, she seen Dr Maudie Mercury.  She thinks she may have sinus infection and still ear pain.  She would like to know if there is something that could be called int to help?  Sever Drainage, ear pain and fullness  Best number [facility] in [place]

## 2018-10-31 NOTE — Patient Instructions (Signed)
Eustachian Tube Dysfunction  Eustachian tube dysfunction refers to a condition in which a blockage develops in the narrow passage that connects the middle ear to the back of the nose (eustachian tube). The eustachian tube regulates air pressure in the middle ear by letting air move between the ear and nose. It also helps to drain fluid from the middle ear space. Eustachian tube dysfunction can affect one or both ears. When the eustachian tube does not function properly, air pressure, fluid, or both can build up in the middle ear. What are the causes? This condition occurs when the eustachian tube becomes blocked or cannot open normally. Common causes of this condition include:  Ear infections.  Colds and other infections that affect the nose, mouth, and throat (upper respiratory tract).  Allergies.  Irritation from cigarette smoke.  Irritation from stomach acid coming up into the esophagus (gastroesophageal reflux). The esophagus is the tube that carries food from the mouth to the stomach.  Sudden changes in air pressure, such as from descending in an airplane or scuba diving.  Abnormal growths in the nose or throat, such as: ? Growths that line the nose (nasal polyps). ? Abnormal growth of cells (tumors). ? Enlarged tissue at the back of the throat (adenoids). What increases the risk? You are more likely to develop this condition if:  You smoke.  You are overweight.  You are a child who has: ? Certain birth defects of the mouth, such as cleft palate. ? Large tonsils or adenoids. What are the signs or symptoms? Common symptoms of this condition include:  A feeling of fullness in the ear.  Ear pain.  Clicking or popping noises in the ear.  Ringing in the ear.  Hearing loss.  Loss of balance.  Dizziness. Symptoms may get worse when the air pressure around you changes, such as when you travel to an area of high elevation, fly on an airplane, or go scuba diving. How is  this diagnosed? This condition may be diagnosed based on:  Your symptoms.  A physical exam of your ears, nose, and throat.  Tests, such as those that measure: ? The movement of your eardrum (tympanogram). ? Your hearing (audiometry). How is this treated? Treatment depends on the cause and severity of your condition.  In mild cases, you may relieve your symptoms by moving air into your ears. This is called "popping the ears."  In more severe cases, or if you have symptoms of fluid in your ears, treatment may include: ? Medicines to relieve congestion (decongestants). ? Medicines that treat allergies (antihistamines). ? Nasal sprays or ear drops that contain medicines that reduce swelling (steroids). ? A procedure to drain the fluid in your eardrum (myringotomy). In this procedure, a small tube is placed in the eardrum to:  Drain the fluid.  Restore the air in the middle ear space. ? A procedure to insert a balloon device through the nose to inflate the opening of the eustachian tube (balloon dilation). Follow these instructions at home: Lifestyle  Do not do any of the following until your health care provider approves: ? Travel to high altitudes. ? Fly in airplanes. ? Work in a pressurized cabin or room. ? Scuba dive.  Do not use any products that contain nicotine or tobacco, such as cigarettes and e-cigarettes. If you need help quitting, ask your health care provider.  Keep your ears dry. Wear fitted earplugs during showering and bathing. Dry your ears completely after. General instructions  Take over-the-counter   and prescription medicines only as told by your health care provider.  Use techniques to help pop your ears as recommended by your health care provider. These may include: ? Chewing gum. ? Yawning. ? Frequent, forceful swallowing. ? Closing your mouth, holding your nose closed, and gently blowing as if you are trying to blow air out of your nose.  Keep all  follow-up visits as told by your health care provider. This is important. Contact a health care provider if:  Your symptoms do not go away after treatment.  Your symptoms come back after treatment.  You are unable to pop your ears.  You have: ? A fever. ? Pain in your ear. ? Pain in your head or neck. ? Fluid draining from your ear.  Your hearing suddenly changes.  You become very dizzy.  You lose your balance. Summary  Eustachian tube dysfunction refers to a condition in which a blockage develops in the eustachian tube.  It can be caused by ear infections, allergies, inhaled irritants, or abnormal growths in the nose or throat.  Symptoms include ear pain, hearing loss, or ringing in the ears.  Mild cases are treated with maneuvers to unblock the ears, such as yawning or ear popping.  Severe cases are treated with medicines. Surgery may also be done (rare). This information is not intended to replace advice given to you by your health care provider. Make sure you discuss any questions you have with your health care provider. Document Released: 01/22/2015 Document Revised: 04/17/2017 Document Reviewed: 04/17/2017 Elsevier Patient Education  R[place], Adult The ears produce a substance called earwax that helps keep bacteria out of the ear and protects the skin in the ear canal. Occasionally, earwax can build up in the ear and cause discomfort or hearing loss. What increases the risk? This condition is more likely to develop in people who:  Are female.  Are elderly.  Naturally produce more earwax.  Clean their ears often with cotton swabs.  Use earplugs often.  Use in-ear headphones often.  Wear hearing aids.  Have narrow ear canals.  Have earwax that is overly thick or sticky.  Have eczema.  Are dehydrated.  Have excess hair in the ear canal. What are the signs or symptoms? Symptoms of this condition include:  Reduced or muffled  hearing.  A feeling of fullness in the ear or feeling that the ear is plugged.  Fluid coming from the ear.  Ear pain.  Ear itch.  Ringing in the ear.  Coughing.  An obvious piece of earwax that can be seen inside the ear canal. How is this diagnosed? This condition may be diagnosed based on:  Your symptoms.  Your medical history.  An ear exam. During the exam, your health care provider will look into your ear with an instrument called an otoscope. You may have tests, including a hearing test. How is this treated? This condition may be treated by:  Using ear drops to soften the earwax.  Having the earwax removed by a health care provider. The health care provider may: ? Flush the ear with water. ? Use an instrument that has a loop on the end (curette). ? Use a suction device.  Surgery to remove the wax buildup. This may be done in severe cases. Follow these instructions at home:   Take over-the-counter and prescription medicines only as told by your health care provider.  Do not put any objects, including cotton swabs, into your ear.  You can clean the opening of your ear canal with a washcloth or facial tissue.  Follow instructions from your health care provider about cleaning your ears. Do not over-clean your ears.  Drink enough fluid to keep your urine clear or pale yellow. This will help to thin the earwax.  Keep all follow-up visits as told by your health care provider. If earwax builds up in your ears often or if you use hearing aids, consider seeing your health care provider for routine, preventive ear cleanings. Ask your health care provider how often you should schedule your cleanings.  If you have hearing aids, clean them according to instructions from the manufacturer and your health care provider. Contact a health care provider if:  You have ear pain.  You develop a fever.  You have blood, pus, or other fluid coming from your ear.  You have hearing  loss.  You have ringing in your ears that does not go away.  Your symptoms do not improve with treatment.  You feel like the room is spinning (vertigo). Summary  Earwax can build up in the ear and cause discomfort or hearing loss.  The most common symptoms of this condition include reduced or muffled hearing and a feeling of fullness in the ear or feeling that the ear is plugged.  This condition may be diagnosed based on your symptoms, your medical history, and an ear exam.  This condition may be treated by using ear drops to soften the earwax or by having the earwax removed by a health care provider.  Do not put any objects, including cotton swabs, into your ear. You can clean the opening of your ear canal with a washcloth or facial tissue. This information is not intended to replace advice given to you by your health care provider. Make sure you discuss any questions you have with your health care provider. Document Released: 02/03/2004 Document Revised: 12/08/2016 Document Reviewed: 03/08/2016 Elsevier Patient Education  2020 [employer].

## 2018-11-08 ENCOUNTER — Other Ambulatory Visit: Payer: Self-pay | Admitting: Family Medicine

## 2018-11-12 ENCOUNTER — Other Ambulatory Visit: Payer: Self-pay | Admitting: Family Medicine

## 2018-11-12 ENCOUNTER — Ambulatory Visit: Payer: Medicare Other | Admitting: Psychology

## 2018-11-12 NOTE — Telephone Encounter (Signed)
Requested medication (s) are due for refill today: no  Requested medication (s) are on the active medication list: yes  Last refill:  12/27/2017  Future visit scheduled: no  Notes to clinic:  Pt states she does not have any of the medication. Pt states she is not having any of the sensations but would like to have the medication on hand. Please advise.    Requested Prescriptions  Pending Prescriptions Disp Refills   nitrofurantoin, macrocrystal-monohydrate, (MACROBID) 100 MG capsule 20 capsule 0    Sig: Take 1 capsule (100 mg total) by mouth 2 (two) times daily.     Off-Protocol Failed - 11/12/2018 10:11 AM      Failed - Medication not assigned to a protocol, review manually.      Passed - Valid encounter within last 12 months    Recent Outpatient Visits          1 week ago Dysfunction of right eustachian tube   [facility], [place]   2 weeks ago Ear fullness, right   [facility], [place], DO   3 weeks ago Flu-like symptoms   [facility] at [facility], MD   1 month ago OAB (overactive bladder)   [facility] at [place], [place]   3 months ago Edema, unspecified type   [facility]rap[facility], [facility] at [facility], Ishmael Holter, MD

## 2018-11-12 NOTE — Telephone Encounter (Signed)
Medication Refill - Medication: nitrofurantoin, macrocrystal-monohydrate, (MACROBID) 100 MG capsule    Has the patient contacted their pharmacy? No. Pt states she does not have any of the medication. Pt states she is not having any of the sensations but would like to have the medication on hand. Please advise.  (Agent: If no, request that the patient contact the pharmacy for the refill.) (Agent: If yes, when and what did the pharmacy advise?)  Preferred Pharmacy (with phone number or street name):  [facility] [place], [place] - [address]  [address] [place] [address]  Phone: [phone] Fax: [phone]  Not a 24 hour pharmacy; exact hours not known.     Agent: Please be advised that RX refills may take up to 3 business days. We ask that you follow-up with your pharmacy.

## 2018-11-25 ENCOUNTER — Ambulatory Visit (INDEPENDENT_AMBULATORY_CARE_PROVIDER_SITE_OTHER): Payer: Medicare Other | Admitting: Family Medicine

## 2018-11-25 ENCOUNTER — Other Ambulatory Visit: Payer: Self-pay

## 2018-11-25 ENCOUNTER — Encounter: Payer: Self-pay | Admitting: Family Medicine

## 2018-11-25 DIAGNOSIS — N390 Urinary tract infection, site not specified: Secondary | ICD-10-CM | POA: Diagnosis not present

## 2018-11-25 NOTE — Progress Notes (Signed)
Subjective:    Patient ID: Megan Crane, female    DOB: 06-09-1950, 68 y.o.   MRN: [id number]  HPI Virtual Visit via Video Note  I connected with the patient on 11/25/18 at  2:00 PM EST by a video enabled telemedicine application and verified that I am speaking with the correct person using two identifiers.  Location patient: home Location provider:work or home office Persons participating in the virtual visit: patient, provider  I discussed the limitations of evaluation and management by telemedicine and the availability of in person appointments. The patient expressed understanding and agreed to proceed.   HPI: Here to ask for a medication refill. She sees Dr. Zigmund Daniel of [facility] for vaginal vault prolapse, and a surgical repair of this has been scheduled for 04-21-19. In the meantime she tends to get an occasional UTI. She asks for a supply of Macrobid to keep on hand during the holidays in cars she needs it. She drinks plenty of water every day.    ROS: See pertinent positives and negatives per HPI.  Past Medical History:  Diagnosis Date  . Anxiety disorder    hx vasovagal responses  . Headache(784.0)   . History of kidney stones   . Hydronephrosis, right   . Hyperlipidemia   . Hypertension   . Nephrolithiasis   . Positive nasal culture for methicillin resistant Staphylococcus aureus   . Right ureteral stone   . Urinary incontinence    sees Dr. McDiarmid     Past Surgical History:  Procedure Laterality Date  . COLONOSCOPY  12/28/2015   per Dr. Havery Moros, serrated polyps and diverticula, repeat in 3 yrs   . CYSTOSCOPY  06/09/2011   Procedure: CYSTOSCOPY;  Surgeon: Reece Packer, MD;  Location: [facility];  Service: Urology;  Laterality: N/A;  . CYSTOSCOPY W/ URETERAL STENT PLACEMENT  02/03/2014   Procedure: CYSTOSCOPY WITH  RIGHT RETROGRADE PYELOGRAM/ RIGHT URETERAL STENT PLACEMENT;  Surgeon: Malka So, MD;  Location: [facility];  Service:  Urology;;  . Consuela Mimes WITH BIOPSY  02/03/2014   Procedure: CYSTOSCOPY WITH BIOPSY;  Surgeon: Malka So, MD;  Location: [facility];  Service: Urology;;  . PILONIDAL CYST EXCISION  1974  . RECTOCELE REPAIR  06/09/2011   Procedure: POSTERIOR REPAIR (RECTOCELE);  Surgeon: Reece Packer, MD;  Location: [facility];  Service: Urology;  Laterality: N/A;  Xenform graft 6x10  . RIGHT URETEROSCOPIC STONE EXTRACTION / STENT PLACEMENT  03-13-2000  . URETEROSCOPY Right 02/03/2014   Procedure: URETEROSCOPY WITH MANAGEMENT OF URETERAL STRICTURE;  Surgeon: Malka So, MD;  Location[facility];  Service: Urology;  Laterality: Right;  Marland Kitchen VAGINAL HYSTERECTOMY  06/09/2011   Procedure: HYSTERECTOMY VAGINAL;  Surgeon: Sharene Butters, MD;  Location: [facility];  Service: Gynecology;  Laterality: N/A;  . VAGINAL PROLAPSE REPAIR  06/09/2011   Procedure: VAGINAL VAULT SUSPENSION;  Surgeon: Reece Packer, MD;  Location: [facility];  Service: Urology;  Laterality: N/A;  . VEIN LIGATION AND STRIPPING     LEFT LEG    Family History  Problem Relation Age of Onset  . Cancer Father        kidney, bladder   . Coronary artery disease Other   . Diabetes Other   . Hyperlipidemia Other   . Kidney disease Other   . Cancer Other        lung  . Colon cancer Mother 32     Current Outpatient Medications:  .  aspirin (ASPIR-LOW) 81 MG EC tablet, Take 81 mg by mouth daily. Swallow whole., Disp: , Rfl:  .  cetirizine (ZYRTEC) 10 MG tablet, Take 1 tablet (10 mg total) by mouth daily., Disp: 30 tablet, Rfl: 0 .  fluticasone (FLONASE) 50 MCG/ACT nasal spray, Place 1 spray into both nostrils daily., Disp: 16 g, Rfl: 0 .  furosemide (LASIX) 20 MG tablet, Take 1 tablet (20 mg total) by mouth daily as needed for fluid., Disp: 90 tablet, Rfl: 3 .  hydrochlorothiazide (HYDRODIURIL) 25 MG tablet, TAKE ONE TABLET BY MOUTH DAILY, Disp: 90 tablet, Rfl: 0 .  mupirocin nasal ointment (BACTROBAN NASAL) 2 %,  Place 1 application into the nose 2 (two) times daily., Disp: 10 g, Rfl: 2 .  nitrofurantoin, macrocrystal-monohydrate, (MACROBID) 100 MG capsule, Take 1 capsule (100 mg total) by mouth 2 (two) times daily., Disp: 20 capsule, Rfl: 0 .  NON FORMULARY, , Disp: , Rfl:  .  omeprazole (PRILOSEC) 40 MG capsule, Take 1 capsule (40 mg total) by mouth daily., Disp: 30 capsule, Rfl: 3 .  solifenacin (VESICARE) 5 MG tablet, Take 1 tablet (5 mg total) by mouth daily., Disp: 90 tablet, Rfl: 3 .  valsartan (DIOVAN) 40 MG tablet, TAKE ONE TABLET BY MOUTH DAILY APPOINTMENT NEEDED FOR MORE REFILLS., Disp: 90 tablet, Rfl: 0  Current Facility-Administered Medications:  .  0.9 %  sodium chloride infusion, 500 mL, Intravenous, Continuous, Armbruster, Carlota Raspberry, MD  EXAM:  VITALS per patient if applicable:  GENERAL: alert, oriented, appears well and in no acute distress  HEENT: atraumatic, conjunttiva clear, no obvious abnormalities on inspection of external nose and ears  NECK: normal movements of the head and neck  LUNGS: on inspection no signs of respiratory distress, breathing rate appears normal, no obvious gross SOB, gasping or wheezing  CV: no obvious cyanosis  MS: moves all visible extremities without noticeable abnormality  PSYCH/NEURO: pleasant and cooperative, no obvious depression or anxiety, speech and thought processing grossly intact  ASSESSMENT AND PLAN: Frequent UTI's, given some Macrobid to keep on hand.  Alysia Penna, MD  Discussed the following assessment and plan:  No diagnosis found.     I discussed the assessment and treatment plan with the patient. The patient was provided an opportunity to ask questions and all were answered. The patient agreed with the plan and demonstrated an understanding of the instructions.   The patient was advised to call back or seek an in-person evaluation if the symptoms worsen or if the condition fails to improve as anticipated.     Review of  Systems     Objective:   Physical Exam        Assessment & Plan:

## 2018-11-27 ENCOUNTER — Ambulatory Visit: Payer: Medicare Other | Admitting: Psychology

## 2018-11-28 ENCOUNTER — Encounter: Payer: Self-pay | Admitting: Family Medicine

## 2018-11-28 DIAGNOSIS — H43813 Vitreous degeneration, bilateral: Secondary | ICD-10-CM | POA: Diagnosis not present

## 2018-11-28 DIAGNOSIS — H35042 Retinal micro-aneurysms, unspecified, left eye: Secondary | ICD-10-CM | POA: Diagnosis not present

## 2018-11-28 DIAGNOSIS — H348122 Central retinal vein occlusion, left eye, stable: Secondary | ICD-10-CM | POA: Diagnosis not present

## 2018-11-28 DIAGNOSIS — H35033 Hypertensive retinopathy, bilateral: Secondary | ICD-10-CM | POA: Diagnosis not present

## 2018-11-29 ENCOUNTER — Telehealth: Payer: Self-pay | Admitting: Family Medicine
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
## 2018-12-03 ENCOUNTER — Other Ambulatory Visit: Payer: Self-pay | Admitting: Family Medicine

## 2018-12-03 NOTE — Telephone Encounter (Signed)
Should I set pt up with an ov? Or tell her to watch for sx?

## 2018-12-03 NOTE — Telephone Encounter (Signed)
Medication Refill - Medication:  nitrofurantoin, macrocrystal-monohydrate, (MACROBID) 100 MG capsule  Has the patient contacted their pharmacy?yes advised to call office. Pt did have virtual visit for this and it was not sent in. Please advise.   Preferred Pharmacy (with phone number or street name):  [facility], [place] - [address] [phone] (Phone) [phone] (Fax)   Agent: Please be advised that RX refills may take up to 3 business days. We ask that you follow-up with your pharmacy.

## 2018-12-03 NOTE — Telephone Encounter (Signed)
Requested medication (s) are due for refill today: no  Requested medication (s) are on the active medication list: yes  Last refill:  12/24/2017  Future visit scheduled: no  Notes to clinic:  Review for refill    Requested Prescriptions  Pending Prescriptions Disp Refills   nitrofurantoin, macrocrystal-monohydrate, (MACROBID) 100 MG capsule 20 capsule 0    Sig: Take 1 capsule (100 mg total) by mouth 2 (two) times daily.     Off-Protocol Failed - 12/03/2018  8:36 AM      Failed - Medication not assigned to a protocol, review manually.      Passed - Valid encounter within last 12 months    Recent Outpatient Visits          1 week ago Frequent UTI   Therapist, [facility] at [facility], Ishmael Holter, MD   1 month ago Dysfunction of right eustachian tube   Therapist, music at [facility], Langley Adie, MD   1 month ago Ear fullness, right   Therapist, [facility] at [facility], [place], DO   1 month ago Flu-like symptoms   Therapist, [facility] at [facility], Ishmael Holter, MD   2 months ago OAB (overactive bladder)   Therapist, music at [facility], Ishmael Holter, MD

## 2018-12-03 NOTE — Telephone Encounter (Signed)
Okay for refill?  

## 2018-12-04 MED ORDER — NITROFURANTOIN MONOHYD MACRO 100 MG PO CAPS: 100.0000 mg | ORAL_CAPSULE | Freq: Two times a day (BID) | ORAL | 0 refills | Status: DC

## 2018-12-04 NOTE — Telephone Encounter (Signed)
I doubt Megan Crane had any exposure. She can simply monitor herself

## 2018-12-09 ENCOUNTER — Encounter: Payer: Self-pay | Admitting: Gastroenterology

## 2018-12-09 ENCOUNTER — Ambulatory Visit: Payer: Medicare Other | Admitting: Psychology

## 2018-12-09 NOTE — Telephone Encounter (Signed)
Patient notified of update  and verbalized understanding. 

## 2018-12-20 ENCOUNTER — Telehealth (INDEPENDENT_AMBULATORY_CARE_PROVIDER_SITE_OTHER): Payer: Medicare Other | Admitting: Family Medicine

## 2018-12-20 ENCOUNTER — Other Ambulatory Visit: Payer: Self-pay

## 2018-12-20 ENCOUNTER — Encounter: Payer: Self-pay | Admitting: Family Medicine

## 2018-12-20 DIAGNOSIS — R070 Pain in throat: Secondary | ICD-10-CM

## 2018-12-20 DIAGNOSIS — Z20822 Contact with and (suspected) exposure to covid-19: Secondary | ICD-10-CM

## 2018-12-20 DIAGNOSIS — R5383 Other fatigue: Secondary | ICD-10-CM

## 2018-12-20 DIAGNOSIS — R519 Headache, unspecified: Secondary | ICD-10-CM | POA: Diagnosis not present

## 2018-12-20 DIAGNOSIS — Z20828 Contact with and (suspected) exposure to other viral communicable diseases: Secondary | ICD-10-CM | POA: Diagnosis not present

## 2018-12-20 DIAGNOSIS — R05 Cough: Secondary | ICD-10-CM

## 2018-12-20 DIAGNOSIS — R6889 Other general symptoms and signs: Secondary | ICD-10-CM

## 2018-12-20 DIAGNOSIS — R197 Diarrhea, unspecified: Secondary | ICD-10-CM

## 2018-12-20 NOTE — Progress Notes (Signed)
Virtual Visit via Video Note  I connected with the patient on 12/20/18 at 11:15 AM EST by a video enabled telemedicine application and verified that I am speaking with the correct person using two identifiers.  Location patient: home Location provider:work or home office Persons participating in the virtual visit: patient, provider  I discussed the limitations of evaluation and management by telemedicine and the availability of in person appointments. The patient expressed understanding and agreed to proceed.   HPI: Here for advice about a possible Covid infection. 10 days ago her husband began to experience body aches, fever, diarrhea, and a dry cough. He feels better now, but he still has mild symptoms. Then one week ago Dawnetta developed similar symptoms including fatigue, headache, ST, a dry cough, and diarrhea. No fever or body aches, no SOB or chest pain. Today she is no better and no worse. She is drinking fluids and taking Tylenol. She asks if she should be tested for the Covid virus. Her husband refuses to be tested.    ROS: See pertinent positives and negatives per HPI.  Past Medical History:  Diagnosis Date  . Anxiety disorder    hx vasovagal responses  . Headache(784.0)   . History of kidney stones   . Hydronephrosis, right   . Hyperlipidemia   . Hypertension   . Nephrolithiasis   . Positive nasal culture for methicillin resistant Staphylococcus aureus   . Right ureteral stone   . Urinary incontinence    sees Dr. McDiarmid     Past Surgical History:  Procedure Laterality Date  . COLONOSCOPY  12/28/2015   per Dr. Havery Moros, serrated polyps and diverticula, repeat in 3 yrs   . CYSTOSCOPY  06/09/2011   Procedure: CYSTOSCOPY;  Surgeon: Reece Packer, MD;  Location: [facility];  Service: Urology;  Laterality: N/A;  . CYSTOSCOPY W/ URETERAL STENT PLACEMENT  02/03/2014   Procedure: CYSTOSCOPY WITH  RIGHT RETROGRADE PYELOGRAM/ RIGHT URETERAL STENT PLACEMENT;  Surgeon: Malka So, MD;  Location: [facility];  Service: Urology;;  . Consuela Mimes WITH BIOPSY  02/03/2014   Procedure: CYSTOSCOPY WITH BIOPSY;  Surgeon: Malka So, MD;  Location: [facility];  Service: Urology;;  . PILONIDAL CYST EXCISION  1974  . RECTOCELE REPAIR  06/09/2011   Procedure: POSTERIOR REPAIR (RECTOCELE);  Surgeon: Reece Packer, MD;  Location: [facility];  Service: Urology;  Laterality: N/A;  Xenform graft 6x10  . RIGHT URETEROSCOPIC STONE EXTRACTION / STENT PLACEMENT  03-13-2000  . URETEROSCOPY Right 02/03/2014   Procedure: URETEROSCOPY WITH MANAGEMENT OF URETERAL STRICTURE;  Surgeon: Malka So, MD;  Location: [facility];  Service: Urology;  Laterality: Right;  Marland Kitchen VAGINAL HYSTERECTOMY  06/09/2011   Procedure: HYSTERECTOMY VAGINAL;  Surgeon: Sharene Butters, MD;  Location: [facility];  Service: Gynecology;  Laterality: N/A;  . VAGINAL PROLAPSE REPAIR  06/09/2011   Procedure: VAGINAL VAULT SUSPENSION;  Surgeon: Reece Packer, MD;  Location: [facility];  Service: Urology;  Laterality: N/A;  . VEIN LIGATION AND STRIPPING     LEFT LEG    Family History  Problem Relation Age of Onset  . Cancer Father        kidney, bladder   . Coronary artery disease Other   . Diabetes Other   . Hyperlipidemia Other   . Kidney disease Other   . Cancer Other        lung  . Colon cancer Mother 4     Current  Outpatient Medications:  .  aspirin (ASPIR-LOW) 81 MG EC tablet, Take 81 mg by mouth daily. Swallow whole., Disp: , Rfl:  .  cetirizine (ZYRTEC) 10 MG tablet, Take 1 tablet (10 mg total) by mouth daily., Disp: 30 tablet, Rfl: 0 .  fluticasone (FLONASE) 50 MCG/ACT nasal spray, Place 1 spray into both nostrils daily., Disp: 16 g, Rfl: 0 .  furosemide (LASIX) 20 MG tablet, Take 1 tablet (20 mg total) by mouth daily as needed for fluid., Disp: 90 tablet, Rfl: 3 .  hydrochlorothiazide (HYDRODIURIL) 25 MG tablet, TAKE ONE TABLET BY MOUTH DAILY, Disp: 90  tablet, Rfl: 0 .  mupirocin nasal ointment (BACTROBAN NASAL) 2 %, Place 1 application into the nose 2 (two) times daily., Disp: 10 g, Rfl: 2 .  nitrofurantoin, macrocrystal-monohydrate, (MACROBID) 100 MG capsule, Take 1 capsule (100 mg total) by mouth 2 (two) times daily., Disp: 20 capsule, Rfl: 0 .  NON FORMULARY, , Disp: , Rfl:  .  omeprazole (PRILOSEC) 40 MG capsule, Take 1 capsule (40 mg total) by mouth daily., Disp: 30 capsule, Rfl: 3 .  solifenacin (VESICARE) 5 MG tablet, Take 1 tablet (5 mg total) by mouth daily., Disp: 90 tablet, Rfl: 3 .  valsartan (DIOVAN) 40 MG tablet, TAKE ONE TABLET BY MOUTH DAILY APPOINTMENT NEEDED FOR MORE REFILLS., Disp: 90 tablet, Rfl: 0  Current Facility-Administered Medications:  .  0.9 %  sodium chloride infusion, 500 mL, Intravenous, Continuous, Armbruster, Carlota Raspberry, MD  EXAM:  VITALS per patient if applicable:  GENERAL: alert, oriented, appears well and in no acute distress  HEENT: atraumatic, conjunttiva clear, no obvious abnormalities on inspection of external nose and ears  NECK: normal movements of the head and neck  LUNGS: on inspection no signs of respiratory distress, breathing rate appears normal, no obvious gross SOB, gasping or wheezing  CV: no obvious cyanosis  MS: moves all visible extremities without noticeable abnormality  PSYCH/NEURO: pleasant and cooperative, no obvious depression or anxiety, speech and thought processing grossly intact  ASSESSMENT AND PLAN: Viral illness. This could definitely be due to the Covid virus, and I advised her to be tested today. She agrees, and she will go to the [facility] site. Alysia Penna, MD  Discussed the following assessment and plan:  No diagnosis found.     I discussed the assessment and treatment plan with the patient. The patient was provided an opportunity to ask questions and all were answered. The patient agreed with the plan and demonstrated an understanding of the  instructions.   The patient was advised to call back or seek an in-person evaluation if the symptoms worsen or if the condition fails to improve as anticipated.

## 2018-12-22 LAB — NOVEL CORONAVIRUS, NAA: SARS-CoV-2, NAA: NOT DETECTED

## 2019-01-21 ENCOUNTER — Other Ambulatory Visit: Payer: Self-pay | Admitting: Family Medicine

## 2019-01-22 ENCOUNTER — Telehealth (INDEPENDENT_AMBULATORY_CARE_PROVIDER_SITE_OTHER): Payer: Medicare Other | Admitting: Family Medicine

## 2019-01-22 ENCOUNTER — Other Ambulatory Visit: Payer: Self-pay

## 2019-01-22 DIAGNOSIS — I10 Essential (primary) hypertension: Secondary | ICD-10-CM

## 2019-01-22 DIAGNOSIS — Z209 Contact with and (suspected) exposure to unspecified communicable disease: Secondary | ICD-10-CM

## 2019-01-22 DIAGNOSIS — Z20822 Contact with and (suspected) exposure to covid-19: Secondary | ICD-10-CM

## 2019-01-22 MED ORDER — VALSARTAN 40 MG PO TABS: ORAL_TABLET | ORAL | 3 refills | Status: DC

## 2019-01-22 MED ORDER — HYDROCHLOROTHIAZIDE 25 MG PO TABS: 25.0000 mg | ORAL_TABLET | Freq: Every day | ORAL | 3 refills | Status: DC

## 2019-01-22 NOTE — Progress Notes (Signed)
Virtual Visit via Video Note  I connected with the patient on 01/22/19 at  1:15 PM EST by a video enabled telemedicine application and verified that I am speaking with the correct person using two identifiers.  Location patient: home Location provider:work or home office Persons participating in the virtual visit: patient, provider  I discussed the limitations of evaluation and management by telemedicine and the availability of in person appointments. The patient expressed understanding and agreed to proceed.   HPI: Here for several issues. First she needs refills on her HTN medications. She feels well and her BP has been stable at home. Also she has questions about the Covid-19 virus. She travelled to [place] over [place] to visit family, and shortly after returning home she learned that 3 of the family members she had been visiting were sick with this virus. She currently feels fine but she asks if she soul be tested.    ROS: See pertinent positives and negatives per HPI.  Past Medical History:  Diagnosis Date  . Anxiety disorder    hx vasovagal responses  . Headache(784.0)   . History of kidney stones   . Hydronephrosis, right   . Hyperlipidemia   . Hypertension   . Nephrolithiasis   . Positive nasal culture for methicillin resistant Staphylococcus aureus   . Right ureteral stone   . Urinary incontinence    sees Dr. McDiarmid     Past Surgical History:  Procedure Laterality Date  . COLONOSCOPY  12/28/2015   per Dr. Havery Moros, serrated polyps and diverticula, repeat in 3 yrs   . CYSTOSCOPY  06/09/2011   Procedure: CYSTOSCOPY;  Surgeon: Reece Packer, MD;  Location: [facility];  Service: Urology;  Laterality: N/A;  . CYSTOSCOPY W/ URETERAL STENT PLACEMENT  02/03/2014   Procedure: CYSTOSCOPY WITH  RIGHT RETROGRADE PYELOGRAM/ RIGHT URETERAL STENT PLACEMENT;  Surgeon: Malka So, MD;  Location: [facility];  Service: Urology;;  . Consuela Mimes WITH BIOPSY  02/03/2014    Procedure: CYSTOSCOPY WITH BIOPSY;  Surgeon: Malka So, MD;  Location: [facility];  Service: Urology;;  . PILONIDAL CYST EXCISION  1974  . RECTOCELE REPAIR  06/09/2011   Procedure: POSTERIOR REPAIR (RECTOCELE);  Surgeon: Reece Packer, MD;  Location: [facility];  Service: Urology;  Laterality: N/A;  Xenform graft 6x10  . RIGHT URETEROSCOPIC STONE EXTRACTION / STENT PLACEMENT  03-13-2000  . URETEROSCOPY Right 02/03/2014   Procedure: URETEROSCOPY WITH MANAGEMENT OF URETERAL STRICTURE;  Surgeon: Malka So, MD;  Location: [facility];  Service: Urology;  Laterality: Right;  Marland Kitchen VAGINAL HYSTERECTOMY  06/09/2011   Procedure: HYSTERECTOMY VAGINAL;  Surgeon: Sharene Butters, MD;  Location: [facility];  Service: Gynecology;  Laterality: N/A;  . VAGINAL PROLAPSE REPAIR  06/09/2011   Procedure: VAGINAL VAULT SUSPENSION;  Surgeon: Reece Packer, MD;  Location: [facility];  Service: Urology;  Laterality: N/A;  . VEIN LIGATION AND STRIPPING     LEFT LEG    Family History  Problem Relation Age of Onset  . Cancer Father        kidney, bladder   . Coronary artery disease Other   . Diabetes Other   . Hyperlipidemia Other   . Kidney disease Other   . Cancer Other        lung  . Colon cancer Mother 25     Current Outpatient Medications:  .  aspirin (ASPIR-LOW) 81 MG EC tablet, Take 81 mg by mouth daily.  Swallow whole., Disp: , Rfl:  .  cetirizine (ZYRTEC) 10 MG tablet, Take 1 tablet (10 mg total) by mouth daily., Disp: 30 tablet, Rfl: 0 .  fluticasone (FLONASE) 50 MCG/ACT nasal spray, Place 1 spray into both nostrils daily., Disp: 16 g, Rfl: 0 .  furosemide (LASIX) 20 MG tablet, Take 1 tablet (20 mg total) by mouth daily as needed for fluid., Disp: 90 tablet, Rfl: 3 .  hydrochlorothiazide (HYDRODIURIL) 25 MG tablet, Take 1 tablet (25 mg total) by mouth daily., Disp: 90 tablet, Rfl: 3 .  mupirocin nasal ointment (BACTROBAN NASAL) 2 %, Place 1 application into the nose 2  (two) times daily., Disp: 10 g, Rfl: 2 .  nitrofurantoin, macrocrystal-monohydrate, (MACROBID) 100 MG capsule, Take 1 capsule (100 mg total) by mouth 2 (two) times daily., Disp: 20 capsule, Rfl: 0 .  NON FORMULARY, , Disp: , Rfl:  .  omeprazole (PRILOSEC) 40 MG capsule, Take 1 capsule (40 mg total) by mouth daily., Disp: 30 capsule, Rfl: 3 .  solifenacin (VESICARE) 5 MG tablet, Take 1 tablet (5 mg total) by mouth daily., Disp: 90 tablet, Rfl: 3 .  valsartan (DIOVAN) 40 MG tablet, TAKE ONE TABLET BY MOUTH DAILY APPOINTMENT NEEDED FOR MORE REFILLS., Disp: 90 tablet, Rfl: 3  Current Facility-Administered Medications:  .  0.9 %  sodium chloride infusion, 500 mL, Intravenous, Continuous, Armbruster, Carlota Raspberry, MD  EXAM:  VITALS per patient if applicable:  GENERAL: alert, oriented, appears well and in no acute distress  HEENT: atraumatic, conjunttiva clear, no obvious abnormalities on inspection of external nose and ears  NECK: normal movements of the head and neck  LUNGS: on inspection no signs of respiratory distress, breathing rate appears normal, no obvious gross SOB, gasping or wheezing  CV: no obvious cyanosis  MS: moves all visible extremities without noticeable abnormality  PSYCH/NEURO: pleasant and cooperative, no obvious depression or anxiety, speech and thought processing grossly intact  ASSESSMENT AND PLAN: HTN is stable so meds were refilled. I did advice her to be tested for the Covid virus because she was certainly in close contact with it 2 weekends ago. She agreed.  Alysia Penna, MD  Discussed the following assessment and plan:  No diagnosis found.     I discussed the assessment and treatment plan with the patient. The patient was provided an opportunity to ask questions and all were answered. The patient agreed with the plan and demonstrated an understanding of the instructions.   The patient was advised to call back or seek an in-person evaluation if the symptoms  worsen or if the condition fails to improve as anticipated.

## 2019-01-23 ENCOUNTER — Emergency Department (INDEPENDENT_AMBULATORY_CARE_PROVIDER_SITE_OTHER)
Admission: EM | Admit: 2019-01-23 | Discharge: 2019-01-23 | Disposition: A | Discharge status: Home / Self Care | Payer: Medicare Other | Source: Home / Self Care

## 2019-01-23 ENCOUNTER — Other Ambulatory Visit: Payer: Self-pay

## 2019-01-23 ENCOUNTER — Encounter: Payer: Self-pay | Admitting: Emergency Medicine

## 2019-01-23 DIAGNOSIS — Z20822 Contact with and (suspected) exposure to covid-19: Secondary | ICD-10-CM

## 2019-01-23 DIAGNOSIS — Z9189 Other specified personal risk factors, not elsewhere classified: Secondary | ICD-10-CM

## 2019-01-23 NOTE — ED Triage Notes (Signed)
PT has intermittent runny nose and scratchy ears.

## 2019-01-23 NOTE — ED Provider Notes (Signed)
[facility]    CSN: [id number] Arrival date & time: 01/23/19  1023      History   Chief Complaint Chief Complaint  Patient presents with  . COVID exposure    HPI Megan Crane is a 69 y.o. female.    HPI  Encounter for COVID-19 testing status post close Covid exposure occurring approximately 10 days ago. Currently asymptomatic with the exception of a occasional runny nose and itchy ears.  Patient is afebrile. Patient is high risk for complications related to Covid due to age >17.  Past Medical History:  Diagnosis Date  . Anxiety disorder    hx vasovagal responses  . Headache(784.0)   . History of kidney stones   . Hydronephrosis, right   . Hyperlipidemia   . Hypertension   . Nephrolithiasis   . Positive nasal culture for methicillin resistant Staphylococcus aureus   . Right ureteral stone   . Urinary incontinence    sees Dr. McDiarmid     Patient Active Problem List   Diagnosis Date Noted  . GERD (gastroesophageal reflux disease) 12/26/2016  . Edema 05/29/2014  . Nasal colonization with methicillin-resistant Staphylococcus aureus 01/22/2014  . Varicose veins of bilateral lower extremities with other complications [id number]  . Allergic rhinitis 11/29/2009  . MOTION SICKNESS 11/29/2009  . Hyperlipidemia 08/31/2008  . Anxiety state 08/31/2008  . Essential hypertension 08/31/2008  . PILONIDAL CYST 08/31/2008  . HEADACHE 08/31/2008  . URINARY INCONTINENCE 08/31/2008  . NEPHROLITHIASIS, HX OF 08/31/2008  . POSTNASAL DRIP SYNDROME 05/28/2006    Past Surgical History:  Procedure Laterality Date  . COLONOSCOPY  12/28/2015   per Dr. Havery Moros, serrated polyps and diverticula, repeat in 3 yrs   . CYSTOSCOPY  06/09/2011   Procedure: CYSTOSCOPY;  Surgeon: Reece Packer, MD;  Location: [facility];  Service: Urology;  Laterality: N/A;  . CYSTOSCOPY W/ URETERAL STENT PLACEMENT  02/03/2014   Procedure: CYSTOSCOPY WITH  RIGHT RETROGRADE PYELOGRAM/ RIGHT  URETERAL STENT PLACEMENT;  Surgeon: Malka So, MD;  Location: [facility];  Service: Urology;;  . Consuela Mimes WITH BIOPSY  02/03/2014   Procedure: CYSTOSCOPY WITH BIOPSY;  Surgeon: Malka So, MD;  Location: [facility];  Service: Urology;;  . PILONIDAL CYST EXCISION  1974  . RECTOCELE REPAIR  06/09/2011   Procedure: POSTERIOR REPAIR (RECTOCELE);  Surgeon: Reece Packer, MD;  Location: [facility];  Service: Urology;  Laterality: N/A;  Xenform graft 6x10  . RIGHT URETEROSCOPIC STONE EXTRACTION / STENT PLACEMENT  03-13-2000  . URETEROSCOPY Right 02/03/2014   Procedure: URETEROSCOPY WITH MANAGEMENT OF URETERAL STRICTURE;  Surgeon: Malka So, MD;  Location: [facility];  Service: Urology;  Laterality: Right;  Marland Kitchen VAGINAL HYSTERECTOMY  06/09/2011   Procedure: HYSTERECTOMY VAGINAL;  Surgeon: Sharene Butters, MD;  Location: [facility];  Service: Gynecology;  Laterality: N/A;  . VAGINAL PROLAPSE REPAIR  06/09/2011   Procedure: VAGINAL VAULT SUSPENSION;  Surgeon: Reece Packer, MD;  Location: [facility];  Service: Urology;  Laterality: N/A;  . VEIN LIGATION AND STRIPPING     LEFT LEG    OB History   No obstetric history on file.      Home Medications    Prior to Admission medications   Medication Sig Start Date End Date Taking? Authorizing Provider  aspirin (ASPIR-LOW) 81 MG EC tablet Take 81 mg by mouth daily. Swallow whole.    [provider]  cetirizine (ZYRTEC) 10 MG tablet Take 1 tablet (  10 mg total) by mouth daily. 10/31/18   Billie Ruddy, MD  fluticasone (FLONASE) 50 MCG/ACT nasal spray Place 1 spray into both nostrils daily. 10/31/18   Billie Ruddy, MD  furosemide (LASIX) 20 MG tablet Take 1 tablet (20 mg total) by mouth daily as needed for fluid. 07/25/18   Laurey Morale, MD  hydrochlorothiazide (HYDRODIURIL) 25 MG tablet Take 1 tablet (25 mg total) by mouth daily. 01/22/19   Laurey Morale, MD  mupirocin nasal ointment  (BACTROBAN NASAL) 2 % Place 1 application into the nose 2 (two) times daily. 06/15/17   Laurey Morale, MD  nitrofurantoin, macrocrystal-monohydrate, (MACROBID) 100 MG capsule Take 1 capsule (100 mg total) by mouth 2 (two) times daily. 12/04/18   Laurey Morale, MD  NON FORMULARY     [provider]  omeprazole (PRILOSEC) 40 MG capsule Take 1 capsule (40 mg total) by mouth daily. 12/26/16   Laurey Morale, MD  solifenacin (VESICARE) 5 MG tablet Take 1 tablet (5 mg total) by mouth daily. 07/25/18   Laurey Morale, MD  valsartan (DIOVAN) 40 MG tablet TAKE ONE TABLET BY MOUTH DAILY APPOINTMENT NEEDED FOR MORE REFILLS. 01/22/19   Laurey Morale, MD    Family History Family History  Problem Relation Age of Onset  . Cancer Father        kidney, bladder   . Coronary artery disease Other   . Diabetes Other   . Hyperlipidemia Other   . Kidney disease Other   . Cancer Other        lung  . Colon cancer Mother 34    Social History Social History   Tobacco Use  . Smoking status: Never Smoker  . Smokeless tobacco: Never Used  Substance Use Topics  . Alcohol use: Yes    Alcohol/week: 0.0 standard drinks    Comment: occ  . Drug use: No     Allergies   Levofloxacin, Lidocaine, and Percodan [oxycodone-aspirin]   Review of Systems Review of Systems Pertinent negatives listed in HPI Physical Exam Triage Vital Signs ED Triage Vitals  Enc Vitals Group     BP 01/23/19 1041 130/84     Pulse Rate 01/23/19 1041 82     Resp 01/23/19 1041 16     Temp 01/23/19 1041 98 F (36.7 C)     Temp Source 01/23/19 1041 Oral     SpO2 01/23/19 1041 97 %     Weight --      Height --      Head Circumference --      Peak Flow --      Pain Score 01/23/19 1036 0     Pain Loc --      Pain Edu? --      Excl. in [place]? --    No data found.  Updated Vital Signs BP 130/84   Pulse 82   Temp 98 F (36.7 C) (Oral)   Resp 16   SpO2 97%   Visual Acuity Right Eye Distance:   Left Eye Distance:    Bilateral Distance:    Right Eye Near:   Left Eye Near:    Bilateral Near:     Physical Exam General appearance: alert, well developed, well nourished, cooperative and in no distress Head: Normocephalic, without obvious abnormality, atraumatic Respiratory: Respirations even and unlabored, normal respiratory rate Heart: rate and rhythm normal. No gallop or murmurs noted on exam  Extremities: No gross deformities Skin: Skin color,  texture, turgor normal. No rashes seen  Psych: Appropriate mood and affect. Neurologic: Alert, oriented to person, place, and time, thought content appropriate.  UC Treatments / Results  Labs (all labs ordered are listed, but only abnormal results are displayed) Labs Reviewed - No data to display  EKG   Radiology No results found.  Procedures Procedures (including critical care time)  Medications Ordered in UC Medications - No data to display  Initial Impression / Assessment and Plan / UC Course  I have reviewed the triage vital signs and the nursing notes.  Pertinent labs & imaging results that were available during my care of the patient were reviewed by me and considered in my medical decision making (see chart for details).    Encounter for COVID-19 testing only. Asymptomatic. Recent exposure x 10 days ago. Encouraged continue isolation precautions, mask wearing, and hand hygiene. Red flags discussed. COVID 19 results will be available in 48-72 hours. Negative results are immediately resulted to Mychart. All positive results are communicated with a phone call from our office.   Final Clinical Impressions(s) / UC Diagnoses   Final diagnoses:  Encounter for laboratory testing for COVID-19 virus  At increased risk of exposure to COVID-19 virus     Discharge Instructions     Your COVID 19 results will be available in 48-72 hours. Negative results are immediately resulted to Mychart. All positive results are communicated with a phone  call from our office.     ED Prescriptions    None     PDMP not reviewed this encounter.   Scot Jun, Piatt 01/25/19 [phone]

## 2019-01-23 NOTE — ED Triage Notes (Signed)
PT visited family and left 1/2. Her family became sick 1/4. Multiple family members have become sick since then. PT is asymptomatic.

## 2019-01-23 NOTE — Discharge Instructions (Signed)
Your COVID 19 results will be available in 48-72 hours. Negative results are immediately resulted to Mychart. All positive results are communicated with a phone call from our office.

## 2019-01-24 LAB — NOVEL CORONAVIRUS, NAA: SARS-CoV-2, NAA: NOT DETECTED

## 2019-01-29 DIAGNOSIS — B351 Tinea unguium: Secondary | ICD-10-CM | POA: Diagnosis not present

## 2019-02-18 ENCOUNTER — Telehealth: Payer: Self-pay | Admitting: Family Medicine

## 2019-02-18 ENCOUNTER — Ambulatory Visit: Payer: Medicare Other | Attending: Internal Medicine

## 2019-02-18 DIAGNOSIS — Z23 Encounter for immunization: Secondary | ICD-10-CM | POA: Insufficient documentation

## 2019-02-18 NOTE — Telephone Encounter (Signed)
Patient is needing reassurance for the COVID vaccine   She has an appointment today at 3 PM  [phone] morning  [phone] after 1 PM  Please Advise

## 2019-02-18 NOTE — Progress Notes (Signed)
   Covid-19 Vaccination Clinic  Name:  Megan Crane    MRN: [id number] DOB: 04/19/1950  02/18/2019  [name] was observed post Covid-19 immunization for 15 minutes without incidence. She was provided with Vaccine Information Sheet and instruction to access the V-Safe system.   [name] was instructed to call 911 with any severe reactions post vaccine: Marland Kitchen Difficulty breathing  . Swelling of your face and throat  . A fast heartbeat  . A bad rash all over your body  . Dizziness and weakness    Immunizations Administered    Name Date Dose VIS Date Route   Pfizer COVID-19 Vaccine 02/18/2019  3:01 PM 0.3 mL 12/20/2018 Intramuscular   Manufacturer: [place]   Lot: [id number]   [place]: [id number]

## 2019-02-21 NOTE — Telephone Encounter (Signed)
ATC Unable to leave a voice mail.

## 2019-02-21 NOTE — Telephone Encounter (Signed)
I think everyone should get the vaccine (like I did). It is safe and effective

## 2019-02-24 NOTE — Telephone Encounter (Signed)
ATC 

## 2019-02-26 ENCOUNTER — Telehealth: Payer: Medicare Other | Admitting: Family Medicine

## 2019-02-28 NOTE — Telephone Encounter (Signed)
ATC Unable to reach the patient. Message will be closed.

## 2019-03-07 ENCOUNTER — Telehealth: Payer: Self-pay | Admitting: Family Medicine

## 2019-03-07 NOTE — Telephone Encounter (Signed)
I recommend she get a rapid Covid test this weekend to make sure she does not have it. If this is negative, she can get the vaccine next week as scheduled

## 2019-03-07 NOTE — Telephone Encounter (Signed)
Pt stated that a friend of hers tested positive for COVID. She stated that she was at least 6 feet apart and had a cloth mask but did not know she tested positive until she left. She does not have any symptoms and feels well. Pt stated she received her first vaccine a few weeks ago and scheduled to get her second shot next week on Thursday.   Her questions: Does she need to get tested and if so how soon should she get tested?  Can she get COVID if she is vaccinated?  Her son-in law, daughter and her kid are coming down today-will not be staying with her-but will be around them so she is wondering if she needs to stay away from them or not? They have had it back in new years but not her grandson.   Pt is extremely worried about catching it and needs guidance. She can be reached at [phone]   Pt states she is so sorry and doesn't mean to bother Sarajane Jews.

## 2019-03-10 ENCOUNTER — Other Ambulatory Visit: Payer: Self-pay

## 2019-03-10 ENCOUNTER — Emergency Department (INDEPENDENT_AMBULATORY_CARE_PROVIDER_SITE_OTHER)
Admission: EM | Admit: 2019-03-10 | Discharge: 2019-03-10 | Disposition: A | Discharge status: Home / Self Care | Payer: Medicare Other | Source: Home / Self Care

## 2019-03-10 DIAGNOSIS — J309 Allergic rhinitis, unspecified: Secondary | ICD-10-CM | POA: Diagnosis not present

## 2019-03-10 DIAGNOSIS — Z20822 Contact with and (suspected) exposure to covid-19: Secondary | ICD-10-CM

## 2019-03-10 NOTE — Telephone Encounter (Signed)
Noted  

## 2019-03-10 NOTE — ED Triage Notes (Signed)
Pt presents due to exposure to confirmed COVID19 + case. Pt was exposed > 6 feet on Friday 03/07/2019. Asymptomatic. No concerns at this time.

## 2019-03-10 NOTE — ED Provider Notes (Addendum)
[facility]    CSN: [id number] Arrival date & time: 03/10/19  1110      History   Chief Complaint Chief Complaint  Patient presents with  . Covid Exposure    HPI Megan Crane is a 69 y.o. female.   HPI Megan Crane is a 69 y.o. female presenting to UC with request for Covid-19 testing as recommended by her PCP due to exposure to Covid-19 on 03/07/2019.  Pt states she was standing across the room from the person who tested positive for covid.  That person did not have a mask on but her son as well as the patient had masks on.  The son did hug the pt.  The son has tested negative for covid but pt is still concerned. She notes she is scheduled for her 2nd Covid-19 vaccine on Thursday, 03/13/2019.   Pt reports mild nasal congestion but she associates that with her seasonal allergies.      Past Medical History:  Diagnosis Date  . Anxiety disorder    hx vasovagal responses  . Headache(784.0)   . History of kidney stones   . Hydronephrosis, right   . Hyperlipidemia   . Hypertension   . Nephrolithiasis   . Positive nasal culture for methicillin resistant Staphylococcus aureus   . Right ureteral stone   . Urinary incontinence    sees Dr. McDiarmid     Patient Active Problem List   Diagnosis Date Noted  . GERD (gastroesophageal reflux disease) 12/26/2016  . Edema 05/29/2014  . Nasal colonization with methicillin-resistant Staphylococcus aureus 01/22/2014  . Varicose veins of bilateral lower extremities with other complications [id number]  . Allergic rhinitis 11/29/2009  . MOTION SICKNESS 11/29/2009  . Hyperlipidemia 08/31/2008  . Anxiety state 08/31/2008  . Essential hypertension 08/31/2008  . PILONIDAL CYST 08/31/2008  . HEADACHE 08/31/2008  . URINARY INCONTINENCE 08/31/2008  . NEPHROLITHIASIS, HX OF 08/31/2008  . POSTNASAL DRIP SYNDROME 05/28/2006    Past Surgical History:  Procedure Laterality Date  . COLONOSCOPY  12/28/2015   per Dr. Havery Moros,  serrated polyps and diverticula, repeat in 3 yrs   . CYSTOSCOPY  06/09/2011   Procedure: CYSTOSCOPY;  Surgeon: Reece Packer, MD;  Location: [facility];  Service: Urology;  Laterality: N/A;  . CYSTOSCOPY W/ URETERAL STENT PLACEMENT  02/03/2014   Procedure: CYSTOSCOPY WITH  RIGHT RETROGRADE PYELOGRAM/ RIGHT URETERAL STENT PLACEMENT;  Surgeon: Malka So, MD;  Location: [facility];  Service: Urology;;  . Consuela Mimes WITH BIOPSY  02/03/2014   Procedure: CYSTOSCOPY WITH BIOPSY;  Surgeon: Malka So, MD;  Location[facility];  Service: Urology;;  . PILONIDAL CYST EXCISION  1974  . RECTOCELE REPAIR  06/09/2011   Procedure: POSTERIOR REPAIR (RECTOCELE);  Surgeon: Reece Packer, MD;  Location: [facility];  Service: Urology;  Laterality: N/A;  Xenform graft 6x10  . RIGHT URETEROSCOPIC STONE EXTRACTION / STENT PLACEMENT  03-13-2000  . URETEROSCOPY Right 02/03/2014   Procedure: URETEROSCOPY WITH MANAGEMENT OF URETERAL STRICTURE;  Surgeon: Malka So, MD;  Location: [facility];  Service: Urology;  Laterality: Right;  Marland Kitchen VAGINAL HYSTERECTOMY  06/09/2011   Procedure: HYSTERECTOMY VAGINAL;  Surgeon: Sharene Butters, MD;  Location: [facility];  Service: Gynecology;  Laterality: N/A;  . VAGINAL PROLAPSE REPAIR  06/09/2011   Procedure: VAGINAL VAULT SUSPENSION;  Surgeon: Reece Packer, MD;  Location: [facility];  Service: Urology;  Laterality: N/A;  . VEIN LIGATION AND STRIPPING  LEFT LEG    OB History   No obstetric history on file.      Home Medications    Prior to Admission medications   Medication Sig Start Date End Date Taking? Authorizing Provider  aspirin (ASPIR-LOW) 81 MG EC tablet Take 81 mg by mouth daily. Swallow whole.    [provider]  fluticasone (FLONASE) 50 MCG/ACT nasal spray Place 1 spray into both nostrils daily. 10/31/18   Billie Ruddy, MD  hydrochlorothiazide (HYDRODIURIL) 25 MG tablet Take 1 tablet (25 mg total) by  mouth daily. 01/22/19   Laurey Morale, MD  NON FORMULARY     [provider]  solifenacin (VESICARE) 5 MG tablet Take 1 tablet (5 mg total) by mouth daily. 07/25/18   Laurey Morale, MD  valsartan (DIOVAN) 40 MG tablet TAKE ONE TABLET BY MOUTH DAILY APPOINTMENT NEEDED FOR MORE REFILLS. 01/22/19   Laurey Morale, MD    Family History Family History  Problem Relation Age of Onset  . Cancer Father        kidney, bladder   . Coronary artery disease Other   . Diabetes Other   . Hyperlipidemia Other   . Kidney disease Other   . Cancer Other        lung  . Colon cancer Mother 67    Social History Social History   Tobacco Use  . Smoking status: Never Smoker  . Smokeless tobacco: Never Used  Substance Use Topics  . Alcohol use: Yes    Alcohol/week: 0.0 standard drinks    Comment: occ  . Drug use: No     Allergies   Levofloxacin, Levofloxacin, Lidocaine, and Percodan [oxycodone-aspirin]   Review of Systems Review of Systems  Constitutional: Negative for chills and fever.  HENT: Positive for rhinorrhea. Negative for congestion, ear pain, sore throat, trouble swallowing and voice change.   Respiratory: Negative for cough and shortness of breath.   Cardiovascular: Negative for chest pain and palpitations.  Gastrointestinal: Negative for abdominal pain, diarrhea, nausea and vomiting.  Musculoskeletal: Negative for arthralgias, back pain and myalgias.  Skin: Negative for rash.     Physical Exam Triage Vital Signs ED Triage Vitals  Enc Vitals Group     BP 03/10/19 1152 109/68     Pulse Rate 03/10/19 1152 84     Resp 03/10/19 1152 18     Temp 03/10/19 1152 99.6 F (37.6 C)     Temp Source 03/10/19 1152 Oral     SpO2 03/10/19 1152 97 %     Weight --      Height 03/10/19 1153 5\' 6"  (1.676 m)     Head Circumference --      Peak Flow --      Pain Score 03/10/19 1153 0     Pain Loc --      Pain Edu? --      Excl. in [place]? --    No data found.  Updated Vital  Signs BP 109/68 (BP Location: Right Arm)   Pulse 84   Temp 99.6 F (37.6 C) (Oral)   Resp 18   Ht 5\' 6"  (1.676 m)   SpO2 97%   BMI 34.38 kg/m   Visual Acuity Right Eye Distance:   Left Eye Distance:   Bilateral Distance:    Right Eye Near:   Left Eye Near:    Bilateral Near:     Physical Exam Vitals and nursing note reviewed.  Constitutional:  Appearance: Normal appearance. She is well-developed.  HENT:     Head: Normocephalic and atraumatic.     Right Ear: Tympanic membrane and ear canal normal.     Left Ear: Tympanic membrane and ear canal normal.     Nose: Nose normal.     Right Sinus: No maxillary sinus tenderness or frontal sinus tenderness.     Left Sinus: No maxillary sinus tenderness or frontal sinus tenderness.     Mouth/Throat:     Lips: Pink.     Mouth: Mucous membranes are moist.     Pharynx: Oropharynx is clear. Uvula midline.  Cardiovascular:     Rate and Rhythm: Normal rate and regular rhythm.  Pulmonary:     Effort: Pulmonary effort is normal. No respiratory distress.     Breath sounds: Normal breath sounds. No stridor. No wheezing or rhonchi.  Musculoskeletal:        General: Normal range of motion.     Cervical back: Normal range of motion.  Skin:    General: Skin is warm and dry.  Neurological:     Mental Status: She is alert and oriented to person, place, and time.  Psychiatric:        Behavior: Behavior normal.      UC Treatments / Results  Labs (all labs ordered are listed, but only abnormal results are displayed) Labs Reviewed  NOVEL CORONAVIRUS, NAA    EKG   Radiology No results found.  Procedures Procedures (including critical care time)  Medications Ordered in UC Medications - No data to display  Initial Impression / Assessment and Plan / UC Course  I have reviewed the triage vital signs and the nursing notes.  Pertinent labs & imaging results that were available during my care of the patient were reviewed by me  and considered in my medical decision making (see chart for details).     Send out covid test pending AVS provided  Final Clinical Impressions(s) / UC Diagnoses   Final diagnoses:  Allergic rhinitis, unspecified seasonality, unspecified trigger  Close exposure to COVID-19 virus     Discharge Instructions          ED Prescriptions    None     PDMP not reviewed this encounter.   Noe Gens, PA-C 03/10/19 1413    Noe Gens, PA-C 03/10/19 1414

## 2019-03-10 NOTE — Telephone Encounter (Signed)
Pt called in, advised of Dr. Sarajane Jews previous notes. She will go get Covid test today.

## 2019-03-11 LAB — NOVEL CORONAVIRUS, NAA: SARS-CoV-2, NAA: NOT DETECTED

## 2019-03-12 ENCOUNTER — Telehealth: Payer: Self-pay | Admitting: Family Medicine

## 2019-03-12 NOTE — Telephone Encounter (Signed)
Pt is aware covid 19 test is neg on 03-12-2019

## 2019-03-13 ENCOUNTER — Ambulatory Visit: Payer: Medicare Other | Attending: Internal Medicine

## 2019-03-13 DIAGNOSIS — Z23 Encounter for immunization: Secondary | ICD-10-CM

## 2019-03-13 NOTE — Progress Notes (Signed)
   Covid-19 Vaccination Clinic  Name:  Megan Crane    MRN: [id number] DOB: 01-19-1950  03/13/2019  Megan Crane was observed post Covid-19 immunization for 15 minutes without incident. She was provided with Vaccine Information Sheet and instruction to access the V-Safe system.   Megan Crane was instructed to call 911 with any severe reactions post vaccine: Marland Kitchen Difficulty breathing  . Swelling of face and throat  . A fast heartbeat  . A bad rash all over body  . Dizziness and weakness   Immunizations Administered    Name Date Dose VIS Date Route   Pfizer COVID-19 Vaccine 03/13/2019 11:15 AM 0.3 mL 12/20/2018 Intramuscular   Manufacturer: [place]   Lot: [id number]   [place]: [id number]

## 2019-03-27 ENCOUNTER — Other Ambulatory Visit: Payer: Self-pay

## 2019-03-28 ENCOUNTER — Encounter: Payer: Self-pay | Admitting: Family Medicine

## 2019-03-28 ENCOUNTER — Ambulatory Visit (INDEPENDENT_AMBULATORY_CARE_PROVIDER_SITE_OTHER): Payer: Medicare Other | Admitting: Family Medicine

## 2019-03-28 VITALS — BP 122/78 | HR 69 | Temp 97.6°F | Ht 66.0 in | Wt 217.2 lb

## 2019-03-28 DIAGNOSIS — M25561 Pain in right knee: Secondary | ICD-10-CM

## 2019-03-28 DIAGNOSIS — G8929 Other chronic pain: Secondary | ICD-10-CM

## 2019-03-28 DIAGNOSIS — L309 Dermatitis, unspecified: Secondary | ICD-10-CM | POA: Diagnosis not present

## 2019-03-28 NOTE — Progress Notes (Signed)
   Subjective:    Patient ID: Megan Crane, female    DOB: [birth year], 69 y.o.   MRN: [id number]  HPI Here asking about several issues. First one week ago she developed an itchy rash over the chest and upper back. Today it seems to be fading away. She normally uses a moisterizing soap like Dove. Also for a few months she has had an intermittent pain in the medial right knee. No swelling. No locking or giving way. No recent trauma.    Review of Systems  Constitutional: Negative.   Respiratory: Negative.   Cardiovascular: Negative.   Musculoskeletal: Positive for arthralgias.  Skin: Positive for rash.       Objective:   Physical Exam Constitutional:      Appearance: Normal appearance.  Cardiovascular:     Rate and Rhythm: Normal rate and regular rhythm.     Pulses: Normal pulses.     Heart sounds: Normal heart sounds.  Pulmonary:     Effort: Pulmonary effort is normal.     Breath sounds: Normal breath sounds.  Musculoskeletal:     Comments: The right knee has crepitus. ROM is full. She is tender in the medial joint space   Skin:    Comments: The skin on the upper torso is clear except for a few excoriations   Neurological:     Mental Status: She is alert.           Assessment & Plan:  Her skin rash is likely a bout of eczema. She may apply a lotion like Eucerin several times a day. This seems to be clearing on its own. Also the right knee has some osteoarthritis starting. She can wear an elastic support sleeve and take Ibuprofen as needed.  Alysia Penna, MD

## 2019-04-09 ENCOUNTER — Telehealth: Payer: Self-pay | Admitting: Family Medicine

## 2019-04-09 NOTE — Telephone Encounter (Addendum)
Pt states she has a pre-op appt tomorrow at 10:30am and an appt at the respiratory clinic Monday 4/5. She is wondering what it is called when she blacks out when getting her ears flushed out? She wants to be knowledgeable when going to these appointments since she has to be tested for COVID for the surgery on the 12th and if she blacks out from it she wants to be able to tell them what she has prior.   Pt can be reached at [phone] or call [phone] if the first one does not work   She needs a call before 9:30am tomorrow if possible.

## 2019-04-10 DIAGNOSIS — E785 Hyperlipidemia, unspecified: Secondary | ICD-10-CM | POA: Diagnosis not present

## 2019-04-10 DIAGNOSIS — N8110 Cystocele, unspecified: Secondary | ICD-10-CM | POA: Diagnosis not present

## 2019-04-10 DIAGNOSIS — Z79899 Other long term (current) drug therapy: Secondary | ICD-10-CM | POA: Diagnosis not present

## 2019-04-10 DIAGNOSIS — Z87442 Personal history of urinary calculi: Secondary | ICD-10-CM | POA: Diagnosis not present

## 2019-04-10 DIAGNOSIS — N952 Postmenopausal atrophic vaginitis: Secondary | ICD-10-CM | POA: Diagnosis not present

## 2019-04-10 DIAGNOSIS — Z8614 Personal history of Methicillin resistant Staphylococcus aureus infection: Secondary | ICD-10-CM | POA: Diagnosis not present

## 2019-04-10 DIAGNOSIS — F419 Anxiety disorder, unspecified: Secondary | ICD-10-CM | POA: Diagnosis not present

## 2019-04-10 DIAGNOSIS — I10 Essential (primary) hypertension: Secondary | ICD-10-CM | POA: Diagnosis not present

## 2019-04-10 DIAGNOSIS — K219 Gastro-esophageal reflux disease without esophagitis: Secondary | ICD-10-CM | POA: Diagnosis not present

## 2019-04-10 NOTE — Telephone Encounter (Signed)
Left message for patient to call back  

## 2019-04-10 NOTE — Telephone Encounter (Signed)
These spells are called "vasovagal episodes" or simple fainting spells from her BP dropping

## 2019-04-14 DIAGNOSIS — Z20822 Contact with and (suspected) exposure to covid-19: Secondary | ICD-10-CM | POA: Diagnosis not present

## 2019-04-14 DIAGNOSIS — Z01812 Encounter for preprocedural laboratory examination: Secondary | ICD-10-CM | POA: Diagnosis not present

## 2019-04-14 DIAGNOSIS — N819 Female genital prolapse, unspecified: Secondary | ICD-10-CM | POA: Diagnosis not present

## 2019-04-14 NOTE — Telephone Encounter (Signed)
Noted. Nothing further needed. 

## 2019-04-14 NOTE — Telephone Encounter (Signed)
Pt called in advised her of previous message from Dr. Sarajane Jews.

## 2019-04-21 DIAGNOSIS — R3914 Feeling of incomplete bladder emptying: Secondary | ICD-10-CM | POA: Diagnosis not present

## 2019-04-21 DIAGNOSIS — E785 Hyperlipidemia, unspecified: Secondary | ICD-10-CM | POA: Diagnosis not present

## 2019-04-21 DIAGNOSIS — K219 Gastro-esophageal reflux disease without esophagitis: Secondary | ICD-10-CM | POA: Diagnosis not present

## 2019-04-21 DIAGNOSIS — N813 Complete uterovaginal prolapse: Secondary | ICD-10-CM | POA: Diagnosis not present

## 2019-04-21 DIAGNOSIS — N8110 Cystocele, unspecified: Secondary | ICD-10-CM | POA: Diagnosis not present

## 2019-04-21 DIAGNOSIS — F419 Anxiety disorder, unspecified: Secondary | ICD-10-CM | POA: Diagnosis not present

## 2019-04-21 DIAGNOSIS — Z87442 Personal history of urinary calculi: Secondary | ICD-10-CM | POA: Diagnosis not present

## 2019-04-21 DIAGNOSIS — R339 Retention of urine, unspecified: Secondary | ICD-10-CM | POA: Diagnosis not present

## 2019-04-21 DIAGNOSIS — N952 Postmenopausal atrophic vaginitis: Secondary | ICD-10-CM | POA: Diagnosis not present

## 2019-04-21 DIAGNOSIS — N812 Incomplete uterovaginal prolapse: Secondary | ICD-10-CM | POA: Diagnosis not present

## 2019-06-04 DIAGNOSIS — N134 Hydroureter: Secondary | ICD-10-CM | POA: Diagnosis not present

## 2019-06-04 DIAGNOSIS — K5792 Diverticulitis of intestine, part unspecified, without perforation or abscess without bleeding: Secondary | ICD-10-CM | POA: Diagnosis not present

## 2019-06-19 DIAGNOSIS — N2889 Other specified disorders of kidney and ureter: Secondary | ICD-10-CM | POA: Diagnosis not present

## 2019-06-19 DIAGNOSIS — R93422 Abnormal radiologic findings on diagnostic imaging of left kidney: Secondary | ICD-10-CM | POA: Diagnosis not present

## 2019-06-19 DIAGNOSIS — N1330 Unspecified hydronephrosis: Secondary | ICD-10-CM | POA: Diagnosis not present

## 2019-06-19 DIAGNOSIS — R339 Retention of urine, unspecified: Secondary | ICD-10-CM | POA: Diagnosis not present

## 2019-06-19 DIAGNOSIS — R1031 Right lower quadrant pain: Secondary | ICD-10-CM | POA: Diagnosis not present

## 2019-06-20 DIAGNOSIS — N1330 Unspecified hydronephrosis: Secondary | ICD-10-CM | POA: Diagnosis not present

## 2019-06-20 DIAGNOSIS — R109 Unspecified abdominal pain: Secondary | ICD-10-CM | POA: Diagnosis not present

## 2019-07-03 ENCOUNTER — Telehealth: Payer: Self-pay | Admitting: Family Medicine

## 2019-07-03 NOTE — Telephone Encounter (Signed)
The patient called to see if Dr. Sarajane Jews can write a Rx for a diet.  She seen Mikle Bosworth and she weighs 220 lbs and needs to lose 20/25 lbs by October in order to have a cystopexy Procedure done.  Her weight is pushing down on her bladder.  She's on restrictions from her doctor, she can't lift 10-15 lbs and no bending down to the floor  Can you also recommend some exercises for her to strengthen her bladder  She also wanted to know what you think about the 800 calorie diet?  2 weeks  Off a week 2 weeks  Fruit  Vegetables Meat

## 2019-07-03 NOTE — Telephone Encounter (Signed)
Please advise 

## 2019-07-08 ENCOUNTER — Telehealth: Payer: Self-pay | Admitting: Family Medicine

## 2019-07-08 NOTE — Telephone Encounter (Signed)
See phone note from 6/24

## 2019-07-08 NOTE — Telephone Encounter (Signed)
(  1) I agree with the diet plan. The main idea is to strictly restirct the intake of carbs and sugars, (2) Kegel exercises are good to strengthen the bladder muscles, she can ask her urologist or GYN about these, and (3) I would focus on cardiovascular exercises like power walking, riding a bike or swimming

## 2019-07-08 NOTE — Telephone Encounter (Signed)
Discussed Dr. Barbie Banner notes with the patient. She expressed understanding. Nothing further needed.

## 2019-07-08 NOTE — Telephone Encounter (Signed)
Left message for patient to call back  

## 2019-07-08 NOTE — Telephone Encounter (Signed)
Pt is returning your call she said you can reach her by cell [phone]

## 2019-08-13 DIAGNOSIS — N8111 Cystocele, midline: Secondary | ICD-10-CM | POA: Diagnosis not present

## 2019-08-13 DIAGNOSIS — N819 Female genital prolapse, unspecified: Secondary | ICD-10-CM | POA: Diagnosis not present

## 2019-09-03 ENCOUNTER — Ambulatory Visit (INDEPENDENT_AMBULATORY_CARE_PROVIDER_SITE_OTHER): Payer: Medicare Other | Admitting: Family Medicine

## 2019-09-03 ENCOUNTER — Other Ambulatory Visit: Payer: Self-pay

## 2019-09-03 ENCOUNTER — Encounter: Payer: Self-pay | Admitting: Family Medicine

## 2019-09-03 VITALS — BP 120/60 | HR 85 | Temp 98.2°F | Wt 220.0 lb

## 2019-09-03 DIAGNOSIS — G8929 Other chronic pain: Secondary | ICD-10-CM

## 2019-09-03 DIAGNOSIS — E782 Mixed hyperlipidemia: Secondary | ICD-10-CM | POA: Diagnosis not present

## 2019-09-03 DIAGNOSIS — I83893 Varicose veins of bilateral lower extremities with other complications: Secondary | ICD-10-CM | POA: Diagnosis not present

## 2019-09-03 DIAGNOSIS — K219 Gastro-esophageal reflux disease without esophagitis: Secondary | ICD-10-CM

## 2019-09-03 DIAGNOSIS — M25561 Pain in right knee: Secondary | ICD-10-CM | POA: Diagnosis not present

## 2019-09-03 DIAGNOSIS — R32 Unspecified urinary incontinence: Secondary | ICD-10-CM | POA: Diagnosis not present

## 2019-09-03 DIAGNOSIS — F411 Generalized anxiety disorder: Secondary | ICD-10-CM | POA: Diagnosis not present

## 2019-09-03 DIAGNOSIS — I10 Essential (primary) hypertension: Secondary | ICD-10-CM

## 2019-09-03 MED ORDER — HYDROCHLOROTHIAZIDE 25 MG PO TABS
25.0000 mg | ORAL_TABLET | Freq: Every day | ORAL | 3 refills | Status: DC
Start: 2019-09-03 — End: 2020-08-02

## 2019-09-03 MED ORDER — SOLIFENACIN SUCCINATE 5 MG PO TABS: 5.0000 mg | ORAL_TABLET | Freq: Every day | ORAL | 3 refills | Status: DC

## 2019-09-03 MED ORDER — VALSARTAN 40 MG PO TABS
ORAL_TABLET | ORAL | 3 refills | Status: DC
Start: 2019-09-03 — End: 2020-08-02

## 2019-09-03 NOTE — Progress Notes (Signed)
Subjective:    Patient ID: Megan Crane, female    DOB: 1950/04/18, 69 y.o.   MRN: [id number]  HPI Here to follow up on issues. She feels well but she does ask about a high pitched ringing in her ears that started a few months ago. No ear pain or hearing loss, no dizziness. Also her chronic right knee pain has gotten worse and she asksk to see a specialist for this. She had a successful surgery to correct a vaginal prolapse and a cystocele per Dr. [place] Pink at [facility].    Review of Systems  Constitutional: Negative.   HENT: Positive for tinnitus.   Eyes: Negative.   Respiratory: Negative.   Cardiovascular: Negative.   Gastrointestinal: Negative.   Genitourinary: Negative for decreased urine volume, difficulty urinating, dyspareunia, dysuria, enuresis, flank pain, frequency, hematuria, pelvic pain and urgency.  Musculoskeletal: Positive for arthralgias.  Skin: Negative.   Neurological: Negative.   Psychiatric/Behavioral: Negative.        Objective:   Physical Exam Constitutional:      General: She is not in acute distress.    Appearance: She is well-developed.  HENT:     Head: Normocephalic and atraumatic.     Right Ear: External ear normal.     Left Ear: External ear normal.     Nose: Nose normal.     Mouth/Throat:     Pharynx: No oropharyngeal exudate.  Eyes:     General: No scleral icterus.    Conjunctiva/sclera: Conjunctivae normal.     Pupils: Pupils are equal, round, and reactive to light.  Neck:     Thyroid: No thyromegaly.     Vascular: No JVD.  Cardiovascular:     Rate and Rhythm: Normal rate and regular rhythm.     Heart sounds: Normal heart sounds. No murmur heard.  No friction rub. No gallop.   Pulmonary:     Effort: Pulmonary effort is normal. No respiratory distress.     Breath sounds: Normal breath sounds. No wheezing or rales.  Chest:     Chest wall: No tenderness.  Abdominal:     General: Bowel sounds are normal. There is no  distension.     Palpations: Abdomen is soft. There is no mass.     Tenderness: There is no abdominal tenderness. There is no guarding or rebound.  Musculoskeletal:        General: No tenderness. Normal range of motion.     Cervical back: Normal range of motion and neck supple.  Lymphadenopathy:     Cervical: No cervical adenopathy.  Skin:    General: Skin is warm and dry.     Findings: No erythema or rash.  Neurological:     Mental Status: She is alert and oriented to person, place, and time.     Cranial Nerves: No cranial nerve deficit.     Motor: No abnormal muscle tone.     Coordination: Coordination normal.     Deep Tendon Reflexes: Reflexes are normal and symmetric. Reflexes normal.  Psychiatric:        Behavior: Behavior normal.        Thought Content: Thought content normal.        Judgment: Judgment normal.           Assessment & Plan:  Her HTN and anxiety are stable. She has some tinnitus and we discussed ways to deal with this like wearing hearing protection around loud noises and limiting her use of NSAIDs.  We wil get fasting labs for lipids, etc. She is past due for a colonoscopy so she will contact the GI office. We will refer her to Orthopedics for the right knee pain.

## 2019-09-04 LAB — BASIC METABOLIC PANEL
BUN: 20 mg/dL (ref 7–25)
CO2: 30 mmol/L (ref 20–32)
Calcium: 9.9 mg/dL (ref 8.6–10.4)
Chloride: 103 mmol/L (ref 98–110)
Creat: 0.81 mg/dL (ref 0.50–0.99)
Glucose, Bld: 95 mg/dL (ref 65–99)
Potassium: 4.1 mmol/L (ref 3.5–5.3)
Sodium: 140 mmol/L (ref 135–146)

## 2019-09-04 LAB — CBC WITH DIFFERENTIAL/PLATELET
Absolute Monocytes: 376 cells/uL (ref 200–950)
Basophils Absolute: 51 cells/uL (ref 0–200)
Basophils Relative: 0.9 %
Eosinophils Absolute: 68 cells/uL (ref 15–500)
Eosinophils Relative: 1.2 %
HCT: 41.2 % (ref 35.0–45.0)
Hemoglobin: 13.7 g/dL (ref 11.7–15.5)
Lymphs Abs: 1682 cells/uL (ref 850–3900)
MCH: 30.2 pg (ref 27.0–33.0)
MCHC: 33.3 g/dL (ref 32.0–36.0)
MCV: 90.9 fL (ref 80.0–100.0)
MPV: 11.5 fL (ref 7.5–12.5)
Monocytes Relative: 6.6 %
Neutro Abs: 3523 cells/uL (ref 1500–7800)
Neutrophils Relative %: 61.8 %
Platelets: 192 10*3/uL (ref 140–400)
RBC: 4.53 10*6/uL (ref 3.80–5.10)
RDW: 12.7 % (ref 11.0–15.0)
Total Lymphocyte: 29.5 %
WBC: 5.7 10*3/uL (ref 3.8–10.8)

## 2019-09-04 LAB — HEPATIC FUNCTION PANEL
AG Ratio: 1.7 (calc) (ref 1.0–2.5)
ALT: 18 U/L (ref 6–29)
AST: 19 U/L (ref 10–35)
Albumin: 4.3 g/dL (ref 3.6–5.1)
Alkaline phosphatase (APISO): 60 U/L (ref 37–153)
Bilirubin, Direct: 0.1 mg/dL (ref 0.0–0.2)
Globulin: 2.5 g/dL (calc) (ref 1.9–3.7)
Indirect Bilirubin: 0.4 mg/dL (calc) (ref 0.2–1.2)
Total Bilirubin: 0.5 mg/dL (ref 0.2–1.2)
Total Protein: 6.8 g/dL (ref 6.1–8.1)

## 2019-09-04 LAB — LIPID PANEL
Cholesterol: 222 mg/dL — ABNORMAL HIGH (ref <200)
HDL: 48 mg/dL — ABNORMAL LOW (ref >=50)
LDL Cholesterol (Calc): 147 mg/dL (calc) — ABNORMAL HIGH
Non-HDL Cholesterol (Calc): 174 mg/dL (calc) — ABNORMAL HIGH (ref <130)
Total CHOL/HDL Ratio: 4.6 (calc) (ref <5.0)
Triglycerides: 141 mg/dL (ref <150)

## 2019-09-04 LAB — TSH: TSH: 1.6 mIU/L (ref 0.40–4.50)

## 2019-09-09 DIAGNOSIS — M1711 Unilateral primary osteoarthritis, right knee: Secondary | ICD-10-CM | POA: Diagnosis not present

## 2019-09-09 DIAGNOSIS — M25561 Pain in right knee: Secondary | ICD-10-CM | POA: Diagnosis not present

## 2019-09-30 ENCOUNTER — Telehealth: Payer: Self-pay | Admitting: Family Medicine

## 2019-09-30 NOTE — Telephone Encounter (Signed)
Spoke with patient she stated to call back today

## 2019-10-06 ENCOUNTER — Ambulatory Visit (INDEPENDENT_AMBULATORY_CARE_PROVIDER_SITE_OTHER): Payer: Medicare Other

## 2019-10-06 ENCOUNTER — Telehealth: Payer: Self-pay | Admitting: Family Medicine

## 2019-10-06 ENCOUNTER — Other Ambulatory Visit: Payer: Self-pay

## 2019-10-06 DIAGNOSIS — Z0000 Encounter for general adult medical examination without abnormal findings: Secondary | ICD-10-CM

## 2019-10-06 NOTE — Patient Instructions (Signed)
Megan Crane , Thank you for taking time to come for your Medicare Wellness Visit. I appreciate your ongoing commitment to your health goals. Please review the following plan we discussed and let me know if I can assist you in the future.   Screening recommendations/referrals: Colonoscopy: Currently due, please contact your gastroenterologist to get your colonoscopy scheduled Mammogram: Up to date, next due 10/25/2019 Bone Density: Currently due, orders placed this visit Recommended yearly ophthalmology/optometry visit for glaucoma screening and checkup Recommended yearly dental visit for hygiene and checkup  Vaccinations: Influenza vaccine: Currently due, you may receive in our office or at your local pharmacy Pneumococcal vaccine: Currently due, for your Pneumococcal 23 vaccine, you may receive at your next in office visit Tdap vaccine: Up to date, next due 07/23/2022 Shingles vaccine: Currently due, please contact your pharmacy to receive the vaccine     Advanced directives: Please bring a copy of your medical advanced directives to the office so that we may scan into your chart.  Conditions/risks identified: None  Next appointment: None    Preventive Care 65 Years and Older, Female Preventive care refers to lifestyle choices and visits with your health care provider that can promote health and wellness. What does preventive care include?  A yearly physical exam. This is also called an annual well check.  Dental exams once or twice a year.  Routine eye exams. Ask your health care provider how often you should have your eyes checked.  Personal lifestyle choices, including:  Daily care of your teeth and gums.  Regular physical activity.  Eating a healthy diet.  Avoiding tobacco and drug use.  Limiting alcohol use.  Practicing safe sex.  Taking low-dose aspirin every day.  Taking vitamin and mineral supplements as recommended by your health care provider. What happens  during an annual well check? The services and screenings done by your health care provider during your annual well check will depend on your age, overall health, lifestyle risk factors, and family history of disease. Counseling  Your health care provider may ask you questions about your:  Alcohol use.  Tobacco use.  Drug use.  Emotional well-being.  Home and relationship well-being.  Sexual activity.  Eating habits.  History of falls.  Memory and ability to understand (cognition).  Work and work Statistician.  Reproductive health. Screening  You may have the following tests or measurements:  Height, weight, and BMI.  Blood pressure.  Lipid and cholesterol levels. These may be checked every 5 years, or more frequently if you are over 57 years old.  Skin check.  Lung cancer screening. You may have this screening every year starting at age 36 if you have a 30-pack-year history of smoking and currently smoke or have quit within the past 15 years.  Fecal occult blood test (FOBT) of the stool. You may have this test every year starting at age 70.  Flexible sigmoidoscopy or colonoscopy. You may have a sigmoidoscopy every 5 years or a colonoscopy every 10 years starting at age 68.  Hepatitis C blood test.  Hepatitis B blood test.  Sexually transmitted disease (STD) testing.  Diabetes screening. This is done by checking your blood sugar (glucose) after you have not eaten for a while (fasting). You may have this done every 1-3 years.  Bone density scan. This is done to screen for osteoporosis. You may have this done starting at age 57.  Mammogram. This may be done every 1-2 years. Talk to your health care provider about  how often you should have regular mammograms. Talk with your health care provider about your test results, treatment options, and if necessary, the need for more tests. Vaccines  Your health care provider may recommend certain vaccines, such  as:  Influenza vaccine. This is recommended every year.  Tetanus, diphtheria, and acellular pertussis (Tdap, Td) vaccine. You may need a Td booster every 10 years.  Zoster vaccine. You may need this after age 48.  Pneumococcal 13-valent conjugate (PCV13) vaccine. One dose is recommended after age 48.  Pneumococcal polysaccharide (PPSV23) vaccine. One dose is recommended after age 33. Talk to your health care provider about which screenings and vaccines you need and how often you need them. This information is not intended to replace advice given to you by your health care provider. Make sure you discuss any questions you have with your health care provider. Document Released: 01/22/2015 Document Revised: 09/15/2015 Document Reviewed: 10/27/2014 Elsevier Interactive Patient Education  2017 [place] Prevention in the Home Falls can cause injuries. They can happen to people of all ages. There are many things you can do to make your home safe and to help prevent falls. What can I do on the outside of my home?  Regularly fix the edges of walkways and driveways and fix any cracks.  Remove anything that might make you trip as you walk through a door, such as a raised step or threshold.  Trim any bushes or trees on the path to your home.  Use bright outdoor lighting.  Clear any walking paths of anything that might make someone trip, such as rocks or tools.  Regularly check to see if handrails are loose or broken. Make sure that both sides of any steps have handrails.  Any raised decks and porches should have guardrails on the edges.  Have any leaves, snow, or ice cleared regularly.  Use sand or salt on walking paths during winter.  Clean up any spills in your garage right away. This includes oil or grease spills. What can I do in the bathroom?  Use night lights.  Install grab bars by the toilet and in the tub and shower. Do not use towel bars as grab bars.  Use  non-skid mats or decals in the tub or shower.  If you need to sit down in the shower, use a plastic, non-slip stool.  Keep the floor dry. Clean up any water that spills on the floor as soon as it happens.  Remove soap buildup in the tub or shower regularly.  Attach bath mats securely with double-sided non-slip rug tape.  Do not have throw rugs and other things on the floor that can make you trip. What can I do in the bedroom?  Use night lights.  Make sure that you have a light by your bed that is easy to reach.  Do not use any sheets or blankets that are too big for your bed. They should not hang down onto the floor.  Have a firm chair that has side arms. You can use this for support while you get dressed.  Do not have throw rugs and other things on the floor that can make you trip. What can I do in the kitchen?  Clean up any spills right away.  Avoid walking on wet floors.  Keep items that you use a lot in easy-to-reach places.  If you need to reach something above you, use a strong step stool that has a grab bar.  Keep  electrical cords out of the way.  Do not use floor polish or wax that makes floors slippery. If you must use wax, use non-skid floor wax.  Do not have throw rugs and other things on the floor that can make you trip. What can I do with my stairs?  Do not leave any items on the stairs.  Make sure that there are handrails on both sides of the stairs and use them. Fix handrails that are broken or loose. Make sure that handrails are as long as the stairways.  Check any carpeting to make sure that it is firmly attached to the stairs. Fix any carpet that is loose or worn.  Avoid having throw rugs at the top or bottom of the stairs. If you do have throw rugs, attach them to the floor with carpet tape.  Make sure that you have a light switch at the top of the stairs and the bottom of the stairs. If you do not have them, ask someone to add them for you. What  else can I do to help prevent falls?  Wear shoes that:  Do not have high heels.  Have rubber bottoms.  Are comfortable and fit you well.  Are closed at the toe. Do not wear sandals.  If you use a stepladder:  Make sure that it is fully opened. Do not climb a closed stepladder.  Make sure that both sides of the stepladder are locked into place.  Ask someone to hold it for you, if possible.  Clearly mark and make sure that you can see:  Any grab bars or handrails.  First and last steps.  Where the edge of each step is.  Use tools that help you move around (mobility aids) if they are needed. These include:  Canes.  Walkers.  Scooters.  Crutches.  Turn on the lights when you go into a dark area. Replace any light bulbs as soon as they burn out.  Set up your furniture so you have a clear path. Avoid moving your furniture around.  If any of your floors are uneven, fix them.  If there are any pets around you, be aware of where they are.  Review your medicines with your doctor. Some medicines can make you feel dizzy. This can increase your chance of falling. Ask your doctor what other things that you can do to help prevent falls. This information is not intended to replace advice given to you by your health care provider. Make sure you discuss any questions you have with your health care provider. Document Released: 10/22/2008 Document Revised: 06/03/2015 Document Reviewed: 01/30/2014 Elsevier Interactive Patient Education  2017 [employer].

## 2019-10-06 NOTE — Telephone Encounter (Signed)
She can get these at her pharmacy. I suggest she get the flu shot first, then wait 2 weeks and then get the booster

## 2019-10-06 NOTE — Telephone Encounter (Signed)
Patient is aware 

## 2019-10-06 NOTE — Telephone Encounter (Signed)
The patient was wanting to know where to get the [place] booster.  She was also wanting to know if she should get the booster first or the flu shot first.  Please advise

## 2019-10-06 NOTE — Progress Notes (Signed)
Subjective:   Megan Crane is a 69 y.o. female who presents for an Initial Medicare Annual Wellness Visit.  I connected with Megan Crane  today by telephone and verified that I am speaking with the correct person using two identifiers. Location patient: home Location provider: work Persons participating in the virtual visit: patient, provider.   I discussed the limitations, risks, security and privacy concerns of performing an evaluation and management service by telephone and the availability of in person appointments. I also discussed with the patient that there may be a patient responsible charge related to this service. The patient expressed understanding and verbally consented to this telephonic visit.    Interactive audio and video telecommunications were attempted between this provider and patient, however failed, due to patient having technical difficulties OR patient did not have access to video capability.  We continued and completed visit with audio only.      Review of Systems    N/A Cardiac Risk Factors include: advanced age (>39men, >38 women)     Objective:    Today's Vitals   There is no height or weight on file to calculate BMI.  Advanced Directives 10/06/2019 12/22/2015 02/03/2014 06/09/2011 05/31/2011  Does Patient Have a Medical Advance Directive? Yes No No Patient does not have advance directive Patient does not have advance directive  Type of Advance Directive [place];Living will - - - -  Does patient want to make changes to medical advance directive? No - Patient declined - - - -  Copy of [place] in Chart? No - copy requested - - - -  Would patient like information on creating a medical advance directive? - - Yes - Educational materials given - -  Pre-existing out of facility DNR order (yellow form or pink MOST form) - - - No -    Current Medications (verified) Outpatient Encounter Medications as of 10/06/2019    Medication Sig  . aspirin (ASPIR-LOW) 81 MG EC tablet Take 81 mg by mouth daily. Swallow whole.  . fluticasone (FLONASE) 50 MCG/ACT nasal spray Place 1 spray into both nostrils daily.  . hydrochlorothiazide (HYDRODIURIL) 25 MG tablet Take 1 tablet (25 mg total) by mouth daily.  . NON FORMULARY   . valsartan (DIOVAN) 40 MG tablet TAKE ONE TABLET BY MOUTH DAILY APPOINTMENT NEEDED FOR MORE REFILLS.  Marland Kitchen solifenacin (VESICARE) 5 MG tablet Take 1 tablet (5 mg total) by mouth daily. (Patient not taking: Reported on 10/06/2019)   Facility-Administered Encounter Medications as of 10/06/2019  Medication  . 0.9 %  sodium chloride infusion    Allergies (verified) Levofloxacin, Levofloxacin, Lidocaine, and Percodan [oxycodone-aspirin]   History: Past Medical History:  Diagnosis Date  . Anxiety disorder    hx vasovagal responses  . Headache(784.0)   . History of kidney stones   . Hydronephrosis, right   . Hyperlipidemia   . Hypertension   . Nephrolithiasis   . Positive nasal culture for methicillin resistant Staphylococcus aureus   . Right ureteral stone   . Urinary incontinence    sees Dr. McDiarmid    Past Surgical History:  Procedure Laterality Date  . COLONOSCOPY  12/28/2015   per Dr. Havery Moros, serrated polyps and diverticula, repeat in 3 yrs   . CYSTOSCOPY  06/09/2011   Procedure: CYSTOSCOPY;  Surgeon: Reece Packer, MD;  Location: [facility];  Service: Urology;  Laterality: N/A;  . CYSTOSCOPY W/ URETERAL STENT PLACEMENT  02/03/2014   Procedure: CYSTOSCOPY WITH  RIGHT  RETROGRADE PYELOGRAM/ RIGHT URETERAL STENT PLACEMENT;  Surgeon: Malka So, MD;  Location: [facility];  Service: Urology;;  . Consuela Mimes WITH BIOPSY  02/03/2014   Procedure: CYSTOSCOPY WITH BIOPSY;  Surgeon: Malka So, MD;  Location: [facility];  Service: Urology;;  . PILONIDAL CYST EXCISION  1974  . RECTOCELE REPAIR  06/09/2011   Procedure: POSTERIOR REPAIR (RECTOCELE);  Surgeon:  Reece Packer, MD;  Location: [facility];  Service: Urology;  Laterality: N/A;  Xenform graft 6x10  . RIGHT URETEROSCOPIC STONE EXTRACTION / STENT PLACEMENT  03-13-2000  . URETEROSCOPY Right 02/03/2014   Procedure: URETEROSCOPY WITH MANAGEMENT OF URETERAL STRICTURE;  Surgeon: Malka So, MD;  Location: [facility];  Service: Urology;  Laterality: Right;  Marland Kitchen VAGINAL HYSTERECTOMY  06/09/2011   Procedure: HYSTERECTOMY VAGINAL;  Surgeon: Sharene Butters, MD;  Location: [facility];  Service: Gynecology;  Laterality: N/A;  . VAGINAL PROLAPSE REPAIR  06/09/2011   Procedure: VAGINAL VAULT SUSPENSION;  Surgeon: Reece Packer, MD;  Location: [facility];  Service: Urology;  Laterality: N/A;  . VEIN LIGATION AND STRIPPING     LEFT LEG   Family History  Problem Relation Age of Onset  . Cancer Father        kidney, bladder   . Coronary artery disease Other   . Diabetes Other   . Hyperlipidemia Other   . Kidney disease Other   . Cancer Other        lung  . Colon cancer Mother 56   Social History   Socioeconomic History  . Marital status: Married    Spouse name: Not on file  . Number of children: Not on file  . Years of education: Not on file  . Highest education level: Not on file  Occupational History  . Not on file  Tobacco Use  . Smoking status: Never Smoker  . Smokeless tobacco: Never Used  Substance and Sexual Activity  . Alcohol use: Yes    Alcohol/week: 0.0 standard drinks    Comment: occ  . Drug use: No  . Sexual activity: Not on file  Other Topics Concern  . Not on file  Social History Narrative  . Not on file   Social Determinants of Health   Financial Resource Strain: Low Risk   . Difficulty of Paying Living Expenses: Not hard at all  Food Insecurity: No Food Insecurity  . Worried About Charity fundraiser in the Last Year: Never true  . Ran Out of Food in the Last Year: Never true  Transportation Needs: No Transportation Needs  . Lack of Transportation  (Medical): No  . Lack of Transportation (Non-Medical): No  Physical Activity: Insufficiently Active  . Days of Exercise per Week: 2 days  . Minutes of Exercise per Session: 60 min  Stress: No Stress Concern Present  . Feeling of Stress : Not at all  Social Connections: Moderately Integrated  . Frequency of Communication with Friends and Family: More than three times a week  . Frequency of Social Gatherings with Friends and Family: Three times a week  . Attends Religious Services: Never  . Active Member of Clubs or Organizations: Yes  . Attends Archivist Meetings: More than 4 times per year  . Marital Status: Married    Tobacco Counseling Counseling given: Not Answered   Clinical Intake:  Pre-visit preparation completed: Yes  Pain : No/denies pain     Nutritional Risks: None Diabetes: No  CBG done?: No Did pt. bring in CBG monitor from home?: No  How often do you need to have someone help you when you read instructions, pamphlets, or other written materials from your doctor or pharmacy?: 1 - Never What is the last grade level you completed in school?: Bachelors of Arts of History  Diabetic?No  Interpreter Needed?: No  Information entered by :: [place] of Daily Living In your present state of health, do you have any difficulty performing the following activities: 10/06/2019  Hearing? Y  Comment has occasional ringing in ears  Vision? N  Difficulty concentrating or making decisions? N  Walking or climbing stairs? N  Dressing or bathing? N  Doing errands, shopping? N  Preparing Food and eating ? N  Using the Toilet? N  In the past six months, have you accidently leaked urine? N  Do you have problems with loss of bowel control? N  Managing your Medications? N  Managing your Finances? N  Housekeeping or managing your Housekeeping? N  Some recent data might be hidden    Patient Care Team: Laurey Morale, MD as PCP - General  Indicate  any recent Medical Services you may have received from other than [facility] providers in the past year (date may be approximate).     Assessment:   This is a routine wellness examination for [place].  Hearing/Vision screen  Hearing Screening   125Hz  250Hz  500Hz  1000Hz  2000Hz  3000Hz  4000Hz  6000Hz  8000Hz   Right ear:           Left ear:           Vision Screening Comments: Patient states gets annual eye exams. Has a retina issues in one eye unable to remember which eye   Dietary issues and exercise activities discussed: Current Exercise Habits: Structured exercise class, Type of exercise: stretching (cardio), Time (Minutes): 60, Frequency (Times/Week): 2, Weekly Exercise (Minutes/Week): 120, Intensity: Moderate, Exercise limited by: None identified  Goals    . Patient Stated     I would like to go to the [facility] Monday-Thursday to work out      Depression Screen PHQ 2/9 Scores 10/06/2019  PHQ - 2 Score 0  PHQ- 9 Score 0    Fall Risk Fall Risk  10/06/2019  Falls in the past year? 0  Number falls in past yr: 0  Injury with Fall? 0  Risk for fall due to : Medication side effect  Follow up Falls evaluation completed;Falls prevention discussed    Any stairs in or around the home? No  If so, are there any without handrails? No  Home free of loose throw rugs in walkways, pet beds, electrical cords, etc? Yes  Adequate lighting in your home to reduce risk of falls? Yes   ASSISTIVE DEVICES UTILIZED TO PREVENT FALLS:  Life alert? No  Use of a cane, walker or w/c? No  Grab bars in the bathroom? No  Shower chair or bench in shower? No  Elevated toilet seat or a handicapped toilet? No     Cognitive Function:  Cognition within normal limits no screening indicated       Immunizations Immunization History  Administered Date(s) Administered  . Fluad Quad(high Dose 65+) 09/10/2018  . Influenza Split 11/23/2010  . Influenza Whole 10/20/2008  . Influenza,inj,Quad PF,6+ Mos 12/11/2012,  09/19/2013, 12/22/2014, 09/29/2015, 09/21/2016, 10/05/2017  . Influenza-Unspecified 09/10/2015  . PFIZER SARS-COV-2 Vaccination 02/18/2019, 03/13/2019  . Pneumococcal Conjugate-13 10/02/2018  . Tdap 07/22/2012    TDAP  status: Up to date Flu Vaccine status: Up to date Pneumococcal vaccine status: Up to date Covid-19 vaccine status: Completed vaccines  Qualifies for Shingles Vaccine? Yes   Zostavax completed No   Shingrix Completed?: No.    Education has been provided regarding the importance of this vaccine. Patient has been advised to call insurance company to determine out of pocket expense if they have not yet received this vaccine. Advised may also receive vaccine at local pharmacy or Health Dept. Verbalized acceptance and understanding.  Screening Tests Health Maintenance  Topic Date Due  . Hepatitis C Screening  Never done  . DEXA SCAN  10/14/2017  . COLONOSCOPY  12/28/2018  . INFLUENZA VACCINE  08/10/2019  . PNA vac Low Risk Adult (2 of 2 - PPSV23) 10/02/2019  . MAMMOGRAM  10/24/2020  . TETANUS/TDAP  07/23/2022  . COVID-19 Vaccine  Completed    Health Maintenance  Health Maintenance Due  Topic Date Due  . Hepatitis C Screening  Never done  . DEXA SCAN  10/14/2017  . COLONOSCOPY  12/28/2018  . INFLUENZA VACCINE  08/10/2019  . PNA vac Low Risk Adult (2 of 2 - PPSV23) 10/02/2019    Colorectal cancer screening: Referral to GI placed 10/06/2019. Pt aware the office will call re: appt. Mammogram status: Completed 10/25/2018. Repeat every year Bone Density status: Ordered 10/06/2019. Pt provided with contact info and advised to call to schedule appt.  Lung Cancer Screening: (Low Dose CT Chest recommended if Age 27-80 years, 30 pack-year currently smoking OR have quit w/in 15years.) does not qualify.   Lung Cancer Screening Referral: N/A  Additional Screening:  Hepatitis C Screening: does qualify;   Vision Screening: Recommended annual ophthalmology exams for early  detection of glaucoma and other disorders of the eye. Is the patient up to date with their annual eye exam?  Yes  Who is the provider or what is the name of the office in which the patient attends annual eye exams? Dr. Lajoyce Corners, and Retina specialist Dr. Yolanda Bonine If pt is not established with a provider, would they like to be referred to a provider to establish care? No .   Dental Screening: Recommended annual dental exams for proper oral hygiene  Community Resource Referral / Chronic Care Management: CRR required this visit?  No   CCM required this visit?  No      Plan:     I have personally reviewed and noted the following in the patient's chart:   . Medical and social history . Use of alcohol, tobacco or illicit drugs  . Current medications and supplements . Functional ability and status . Nutritional status . Physical activity . Advanced directives . List of other physicians . Hospitalizations, surgeries, and ER visits in previous 12 months . Vitals . Screenings to include cognitive, depression, and falls . Referrals and appointments  In addition, I have reviewed and discussed with patient certain preventive protocols, quality metrics, and best practice recommendations. A written personalized care plan for preventive services as well as general preventive health recommendations were provided to patient.     Ofilia Neas, LPN   [id number]   Nurse Notes: None

## 2019-10-09 ENCOUNTER — Telehealth: Payer: Self-pay | Admitting: Family Medicine

## 2019-10-09 NOTE — Telephone Encounter (Signed)
The patient is wanting to know if we can move her up sooner on the schedule for her flu shot. Her daughter is due with her grand-daughter around that time. She's hoping to have it done within the next two weeks.  She also wants to know if we do the whooping cough vaccinations.  She also wants to know how far apart to have the flu shot and the COVID booster.  Please advise

## 2019-10-09 NOTE — Telephone Encounter (Signed)
There was no availability for the flu prior to her scheduled appointment. I told her she could go to a pharmacy to get one, but she decided she wanted to wait, but did ask to be put on a waiting list if there were a cancellation. She has a [place] due soon and was wanting to make sure she is protected.  She had a Tdap in 2014 and I told her that was good for 10 years.  She said she has decided to wait a month in between her flu and COVID booster.

## 2019-10-30 DIAGNOSIS — Z23 Encounter for immunization: Secondary | ICD-10-CM | POA: Diagnosis not present

## 2019-11-24 ENCOUNTER — Other Ambulatory Visit: Payer: Self-pay

## 2019-11-24 ENCOUNTER — Ambulatory Visit: Payer: Medicare Other

## 2019-11-24 ENCOUNTER — Ambulatory Visit (INDEPENDENT_AMBULATORY_CARE_PROVIDER_SITE_OTHER): Payer: Medicare Other | Admitting: Family Medicine

## 2019-11-24 ENCOUNTER — Encounter: Payer: Self-pay | Admitting: Family Medicine

## 2019-11-24 VITALS — BP 122/80 | HR 91 | Temp 98.2°F | Ht 66.0 in | Wt 216.2 lb

## 2019-11-24 DIAGNOSIS — Z7689 Persons encountering health services in other specified circumstances: Secondary | ICD-10-CM | POA: Diagnosis not present

## 2019-11-24 DIAGNOSIS — Z23 Encounter for immunization: Secondary | ICD-10-CM | POA: Diagnosis not present

## 2019-11-24 DIAGNOSIS — M8949 Other hypertrophic osteoarthropathy, multiple sites: Secondary | ICD-10-CM

## 2019-11-24 DIAGNOSIS — M159 Polyosteoarthritis, unspecified: Secondary | ICD-10-CM

## 2019-11-24 NOTE — Progress Notes (Signed)
   Subjective:    Patient ID: Marina Gravel, female    DOB: 10/21/1950, 69 y.o.   MRN: [id number]  HPI Here to discuss arthritis and for a flu shot. Also to discuss any other immunizations she may need before she travels to [place] to see her newborn granddaughter. She got her Covid vaccine booster a few weeks ago. She has stiffness and pain in many joints in the mornings and this eases up during the day. She is trying to lose weight.    Review of Systems  Constitutional: Negative.   Respiratory: Negative.   Cardiovascular: Negative.   Musculoskeletal: Positive for arthralgias.       Objective:   Physical Exam Constitutional:      Appearance: Normal appearance.  Cardiovascular:     Rate and Rhythm: Normal rate and regular rhythm.     Pulses: Normal pulses.     Heart sounds: Normal heart sounds.  Pulmonary:     Effort: Pulmonary effort is normal.     Breath sounds: Normal breath sounds.  Neurological:     Mental Status: She is alert.           Assessment & Plan:  She has some osteoarthritis and she can use ES Tylenol each morning. She is given a flu shot today. Otherwise she got a TDaP in 2014, so she will not need any more immunizations before her trip. Alysia Penna, MD

## 2019-12-15 DIAGNOSIS — Z1231 Encounter for screening mammogram for malignant neoplasm of breast: Secondary | ICD-10-CM | POA: Diagnosis not present

## 2020-01-21 DIAGNOSIS — H47323 Drusen of optic disc, bilateral: Secondary | ICD-10-CM | POA: Diagnosis not present

## 2020-01-31 IMAGING — DX DG KNEE COMPLETE 4+V*R*
4 series · 4 of 4 positions shown · non-contrast
Comparison: None.

CLINICAL DATA: Patient complains of intermittent pain in the medial
right knee X several months. Patient denies injury.

EXAM:
RIGHT KNEE - COMPLETE 4+ VIEW

[knee ap]
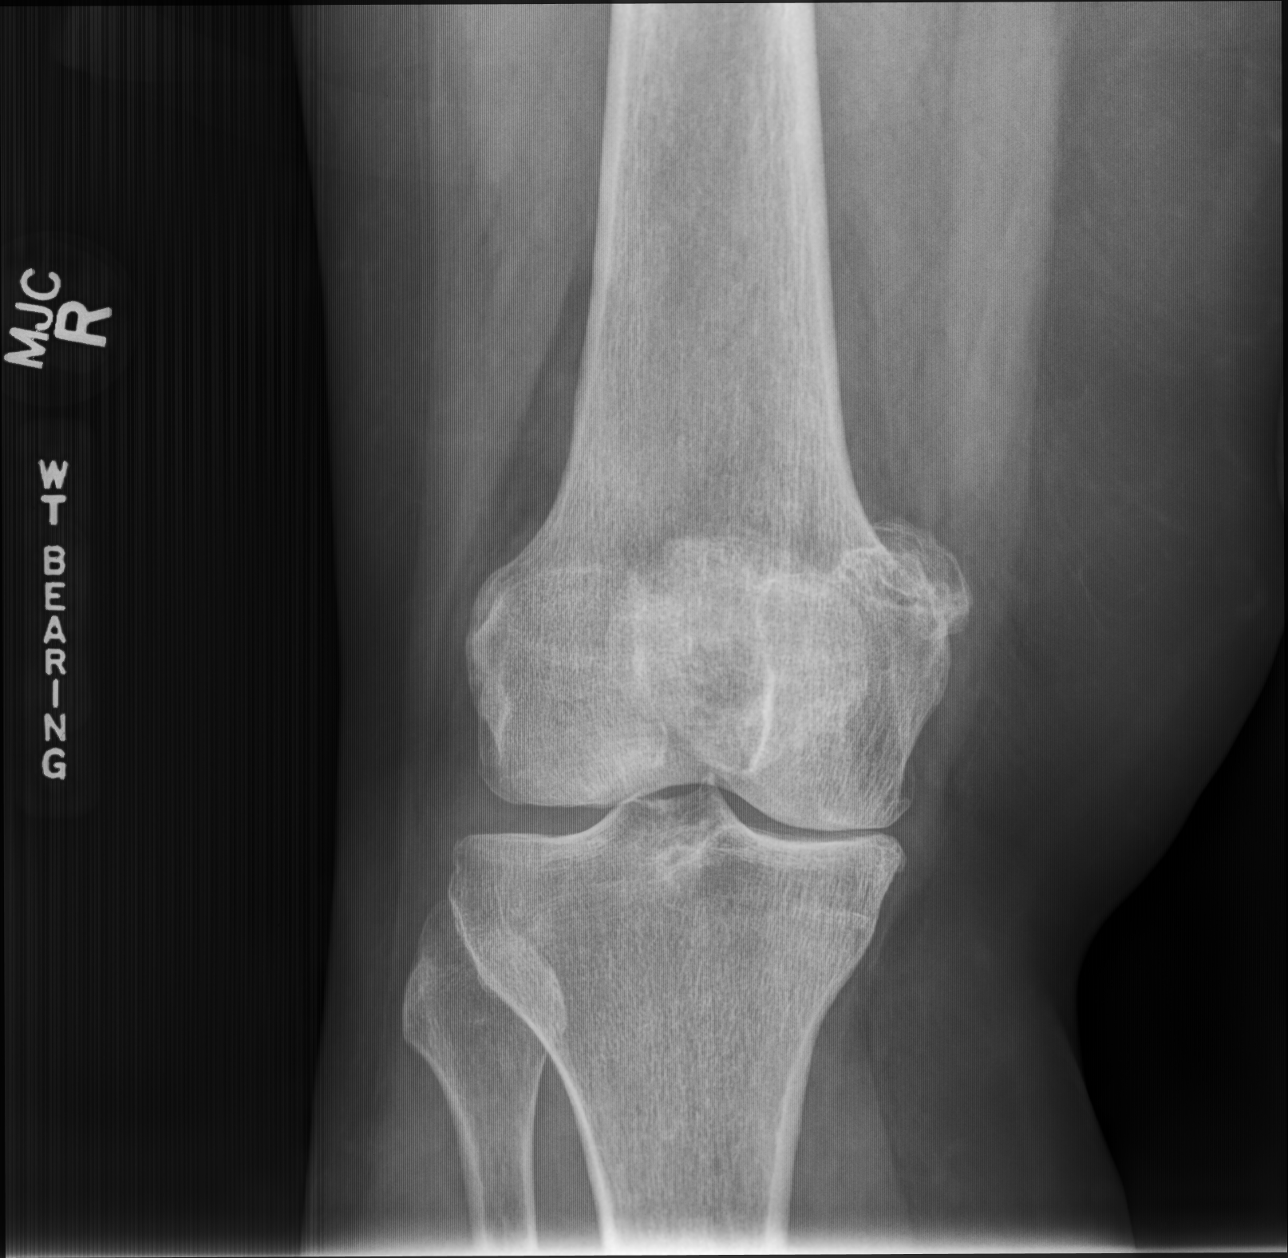

[knee intercondylar (tunnel view)]
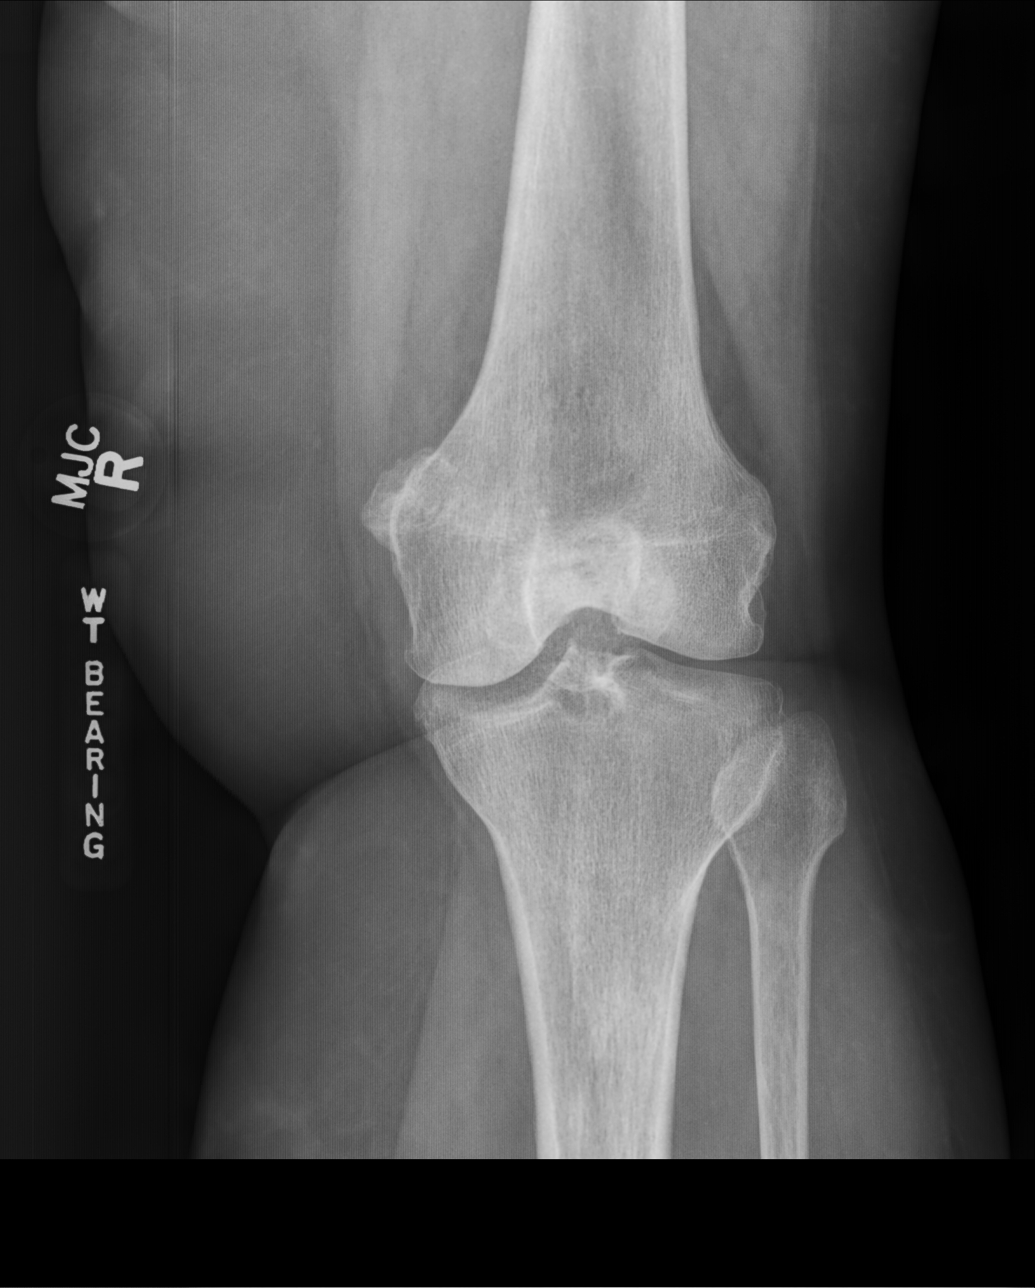

[knee lat]
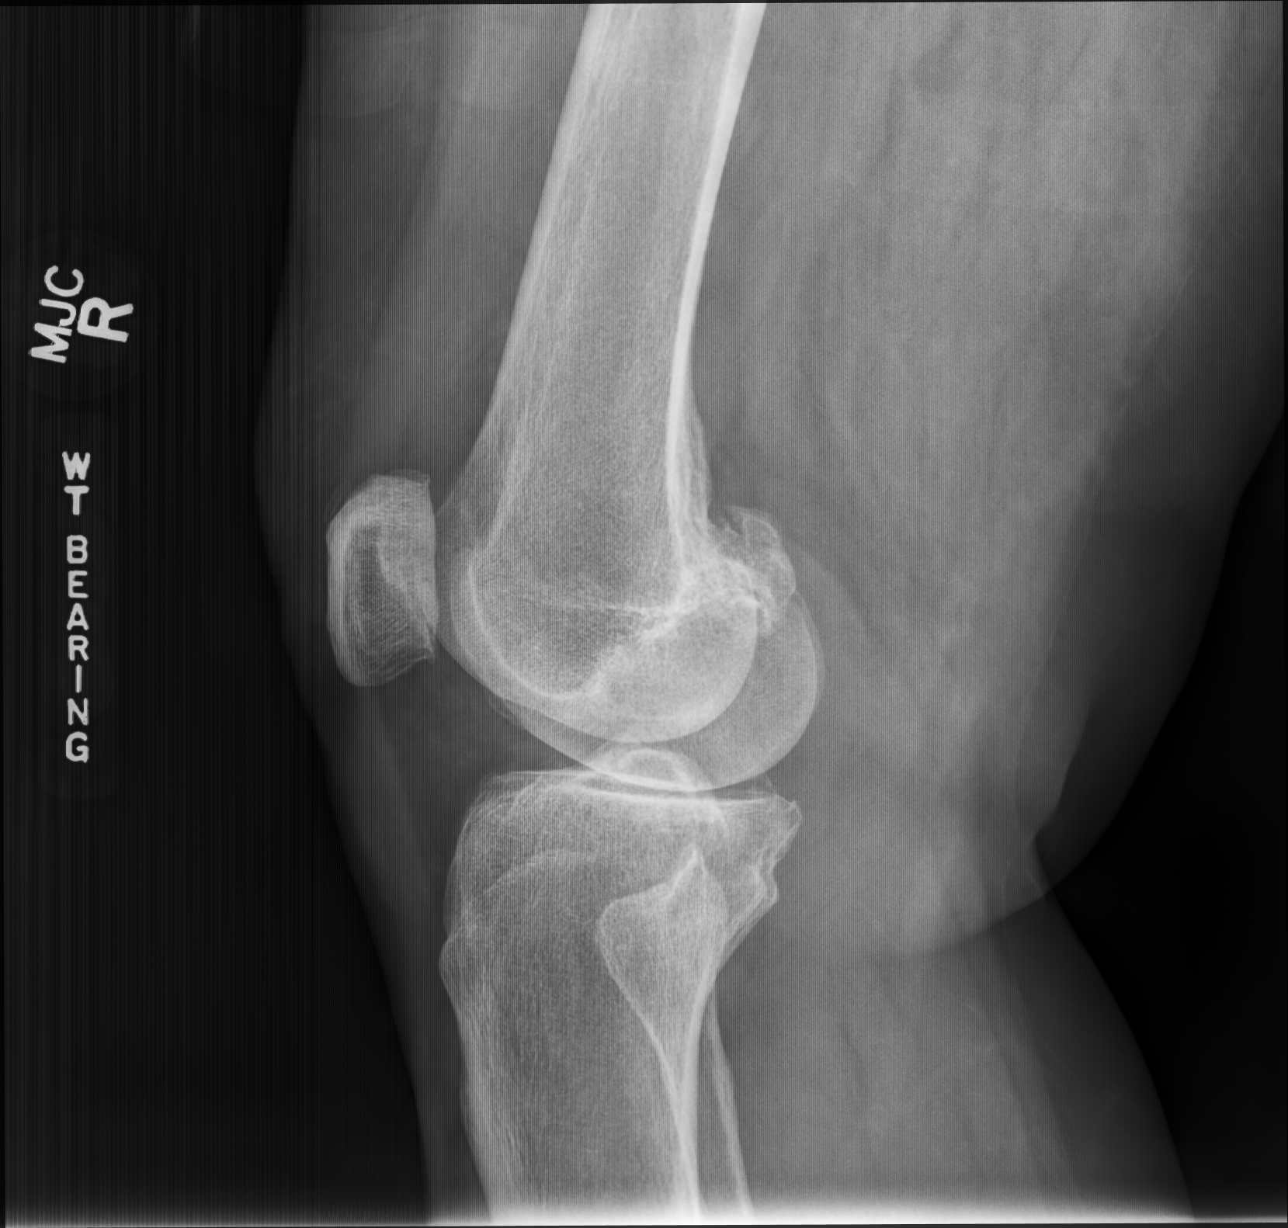

[patella (sunrise) tan]
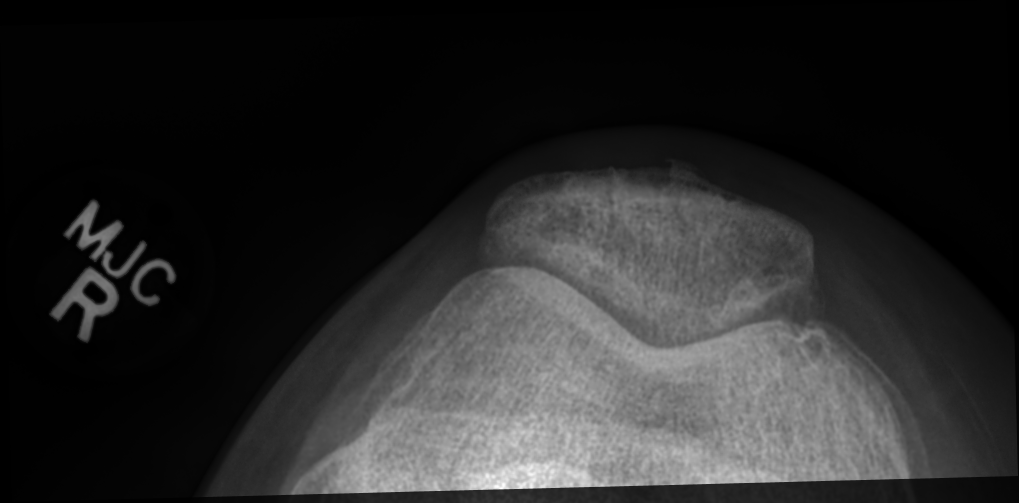

[4 of 4 positions shown; findings below may reference images not displayed]

FINDINGS: Probable exostosis from the posterior aspect of the medial femoral
condyle. Normal alignment. Narrowing of the articular cartilage in
the medial compartment with marginal spurring. Small patellar spurs.
No fracture. No dislocation. No definite effusion.
IMPRESSION: 1. Negative for fracture or other acute bone abnormality.
2. Mild degenerative changes most marked in the medial compartment.
3. Benign-appearing exostosis posteriorly from the medial femoral
condyle. If pain localized to this region, recommend MR for further
characterization to exclude malignant degeneration.

## 2020-02-03 ENCOUNTER — Encounter: Payer: Self-pay | Admitting: Family Medicine

## 2020-02-03 ENCOUNTER — Telehealth (INDEPENDENT_AMBULATORY_CARE_PROVIDER_SITE_OTHER): Payer: Medicare Other | Admitting: Family Medicine

## 2020-02-03 VITALS — Ht 66.0 in | Wt 210.0 lb

## 2020-02-03 DIAGNOSIS — H6981 Other specified disorders of Eustachian tube, right ear: Secondary | ICD-10-CM

## 2020-02-03 DIAGNOSIS — R609 Edema, unspecified: Secondary | ICD-10-CM | POA: Diagnosis not present

## 2020-02-03 DIAGNOSIS — J309 Allergic rhinitis, unspecified: Secondary | ICD-10-CM | POA: Diagnosis not present

## 2020-02-03 DIAGNOSIS — H6991 Unspecified Eustachian tube disorder, right ear: Secondary | ICD-10-CM

## 2020-02-03 MED ORDER — FLUTICASONE PROPIONATE 50 MCG/ACT NA SUSP: 1.0000 | Freq: Every day | NASAL | 11 refills | Status: DC

## 2020-02-03 MED ORDER — FUROSEMIDE 20 MG PO TABS: ORAL_TABLET | ORAL | 11 refills | Status: DC

## 2020-02-03 MED ORDER — CETIRIZINE HCL 10 MG PO TABS: 10.0000 mg | ORAL_TABLET | Freq: Every day | ORAL | 11 refills | Status: DC

## 2020-02-03 NOTE — Progress Notes (Signed)
   Subjective:    Patient ID: Megan Crane, female    DOB: December 02, 1950, 70 y.o.   MRN: [id number]  HPI Virtual Visit via Telephone Note  I connected with the patient on 02/03/20 at 11:00 AM EST by telephone and verified that I am speaking with the correct person using two identifiers.   I discussed the limitations, risks, security and privacy concerns of performing an evaluation and management service by telephone and the availability of in person appointments. I also discussed with the patient that there may be a patient responsible charge related to this service. The patient expressed understanding and agreed to proceed.  Location patient: home Location provider: work or home office Participants present for the call: patient, provider Patient did not have a visit in the prior 7 days to address this/these issue(s).   History of Present Illness: Here for several issues. First she asks for refills on Lasix to use as needed. She occasionally gets some mild puffiness in the hands and feet. No SOB. Also for several weeks she has had slight nasal stuffiness with PND. No headache or body aches. No fever or ST. No cough or NVD.   Observations/Objective: Patient sounds cheerful and well on the phone. I do not appreciate any SOB. Speech and thought processing are grossly intact. Patient reported vitals:  Assessment and Plan: For mild edema, she can use lasix 20 mg daily as needed. She has allergies so she can use Zyrtec and Flonase every day. Recheck prn.  Alysia Penna, MD   Follow Up Instructions:     [phone] [phone] 11-20 [id number] 21-30 I did not refer this patient for an OV in the next 24 hours for this/these issue(s).  I discussed the assessment and treatment plan with the patient. The patient was provided an opportunity to ask questions and all were answered. The patient agreed with the plan and demonstrated an understanding of the instructions.   The patient was advised to call  back or seek an in-person evaluation if the symptoms worsen or if the condition fails to improve as anticipated.  I provided 13 minutes of non-face-to-face time during this encounter.   Alysia Penna, MD    Review of Systems     Objective:   Physical Exam        Assessment & Plan:

## 2020-03-31 ENCOUNTER — Telehealth: Payer: Self-pay | Admitting: Family Medicine

## 2020-03-31 NOTE — Telephone Encounter (Signed)
The patient has some concerns about dizzy spells and weakness that she is having. She's wondering if stress can cause this.   Please advise

## 2020-03-31 NOTE — Telephone Encounter (Signed)
Set up an in person OV for this

## 2020-03-31 NOTE — Telephone Encounter (Signed)
Spoke with patient, she stated that she really didn't get dizzy, she more had a "weak spell" where her legs got very weak.  No dizziness, SOB, chest pain, sweating.  Episode lasted less that 10 mins.  Patient said she is willing to come in for an appointment if needed.

## 2020-04-01 ENCOUNTER — Telehealth: Payer: Self-pay | Admitting: Family Medicine

## 2020-04-01 ENCOUNTER — Other Ambulatory Visit: Payer: Self-pay

## 2020-04-01 NOTE — Telephone Encounter (Signed)
See previous message

## 2020-04-01 NOTE — Telephone Encounter (Signed)
LVM to call office and schedule an appointment

## 2020-04-01 NOTE — Telephone Encounter (Signed)
FYI pt called back to say she will make her appt on Friday

## 2020-04-02 ENCOUNTER — Encounter: Payer: Self-pay | Admitting: Family Medicine

## 2020-04-02 ENCOUNTER — Ambulatory Visit (INDEPENDENT_AMBULATORY_CARE_PROVIDER_SITE_OTHER): Payer: Medicare Other | Admitting: Family Medicine

## 2020-04-02 VITALS — BP 128/82 | HR 85 | Temp 98.0°F | Wt 214.0 lb

## 2020-04-02 DIAGNOSIS — R531 Weakness: Secondary | ICD-10-CM

## 2020-04-02 DIAGNOSIS — M2550 Pain in unspecified joint: Secondary | ICD-10-CM | POA: Diagnosis not present

## 2020-04-02 DIAGNOSIS — I10 Essential (primary) hypertension: Secondary | ICD-10-CM

## 2020-04-02 DIAGNOSIS — F411 Generalized anxiety disorder: Secondary | ICD-10-CM | POA: Diagnosis not present

## 2020-04-02 LAB — BASIC METABOLIC PANEL
BUN: 18 mg/dL (ref 6–23)
CO2: 30 mEq/L (ref 19–32)
Calcium: 10.6 mg/dL — ABNORMAL HIGH (ref 8.4–10.5)
Chloride: 101 mEq/L (ref 96–112)
Creatinine, Ser: 0.84 mg/dL (ref 0.40–1.20)
GFR: 71.88 mL/min (ref >60.00)
Glucose, Bld: 92 mg/dL (ref 70–99)
Potassium: 4.2 mEq/L (ref 3.5–5.1)
Sodium: 139 mEq/L (ref 135–145)

## 2020-04-02 LAB — HEPATIC FUNCTION PANEL
ALT: 18 U/L (ref 0–35)
AST: 18 U/L (ref 0–37)
Albumin: 4.6 g/dL (ref 3.5–5.2)
Alkaline Phosphatase: 69 U/L (ref 39–117)
Bilirubin, Direct: 0.1 mg/dL (ref 0.0–0.3)
Total Bilirubin: 0.5 mg/dL (ref 0.2–1.2)
Total Protein: 7.2 g/dL (ref 6.0–8.3)

## 2020-04-02 LAB — TSH: TSH: 1.43 u[IU]/mL (ref 0.35–4.50)

## 2020-04-02 LAB — CBC WITH DIFFERENTIAL/PLATELET
Basophils Absolute: 0 10*3/uL (ref 0.0–0.1)
Basophils Relative: 0.7 % (ref 0.0–3.0)
Eosinophils Absolute: 0.1 10*3/uL (ref 0.0–0.7)
Eosinophils Relative: 0.9 % (ref 0.0–5.0)
HCT: 41.9 % (ref 36.0–46.0)
Hemoglobin: 14.1 g/dL (ref 12.0–15.0)
Lymphocytes Relative: 28.8 % (ref 12.0–46.0)
Lymphs Abs: 1.7 10*3/uL (ref 0.7–4.0)
MCHC: 33.6 g/dL (ref 30.0–36.0)
MCV: 89.9 fl (ref 78.0–100.0)
Monocytes Absolute: 0.4 10*3/uL (ref 0.1–1.0)
Monocytes Relative: 7.5 % (ref 3.0–12.0)
Neutro Abs: 3.7 10*3/uL (ref 1.4–7.7)
Neutrophils Relative %: 62.1 % (ref 43.0–77.0)
Platelets: 195 10*3/uL (ref 150.0–400.0)
RBC: 4.66 Mil/uL (ref 3.87–5.11)
RDW: 13.6 % (ref 11.5–15.5)
WBC: 5.9 10*3/uL (ref 4.0–10.5)

## 2020-04-02 LAB — VITAMIN B12: Vitamin B-12: 369 pg/mL (ref 211–911)

## 2020-04-02 LAB — T4, FREE: Free T4: 0.89 ng/dL (ref 0.60–1.60)

## 2020-04-02 LAB — T3, FREE: T3, Free: 2.8 pg/mL (ref 2.3–4.2)

## 2020-04-02 MED ORDER — ESCITALOPRAM OXALATE 10 MG PO TABS: 10.0000 mg | ORAL_TABLET | Freq: Every day | ORAL | 2 refills | Status: DC

## 2020-04-02 NOTE — Progress Notes (Signed)
   Subjective:    Patient ID: Megan Crane, female    DOB: 1950-04-08, 70 y.o.   MRN: [id number]  HPI Here to discuss several issues. First she describes several episodes in the past few weeks of suddenly feeling weakness all over her body to the point she had to sit down. No lightheadedness or dizziness. No chest pain or SOB. This usually passes after 3-5 minutes. She also has spells where her hands feel numb or tingle (not the feet). She thinks these may all be related to anxiety. She has dealt with anxiety all her life, and she has a very stressful marital situation. She has been talking to therapists for years but she has never taken a medication for this. She thinks she is now to the point that she wants to try something. She is thinking about buying a stationary bicycle so she can exercise ar home. She knows she needs to lose weight, and this would help her stress levels. Her sleep and appetite are intact.    Review of Systems  Constitutional: Positive for fatigue.  Respiratory: Negative.   Cardiovascular: Negative.   Gastrointestinal: Negative.   Genitourinary: Negative.   Neurological: Positive for weakness and numbness. Negative for dizziness, tremors, seizures, syncope, facial asymmetry, speech difficulty, light-headedness and headaches.  Psychiatric/Behavioral: Negative for agitation, behavioral problems, confusion, decreased concentration, dysphoric mood, hallucinations and sleep disturbance. The patient is nervous/anxious.        Objective:   Physical Exam Constitutional:      Appearance: Normal appearance. She is not ill-appearing.  Cardiovascular:     Rate and Rhythm: Normal rate and regular rhythm.     Pulses: Normal pulses.     Heart sounds: Normal heart sounds.  Pulmonary:     Effort: Pulmonary effort is normal.     Breath sounds: Normal breath sounds.  Neurological:     General: No focal deficit present.     Mental Status: She is alert and oriented to person,  place, and time.     Cranial Nerves: No cranial nerve deficit.     Motor: No weakness.     Coordination: Coordination normal.     Gait: Gait normal.  Psychiatric:        Behavior: Behavior normal.        Thought Content: Thought content normal.        Judgment: Judgment normal.     Comments: Anxious           Assessment & Plan:  She has a lot of anxiety, and I think this is responsible for all the physical symptoms she is describing. We will check labs today to make sure. We will start her on Lexapro 10 m daily, and recheck in 3-4 weeks.  Alysia Penna, MD

## 2020-04-05 ENCOUNTER — Telehealth: Payer: Self-pay | Admitting: Family Medicine

## 2020-04-05 NOTE — Telephone Encounter (Signed)
Pt is calling in to see if Dr. Sarajane Jews would run a lab test from the blood that she gave on Friday 04/02/2020 for arthritis due to arthritis runs on her mothers side of the family.

## 2020-04-06 NOTE — Addendum Note (Signed)
Addended by: Alysia Penna A on: 04/06/2020 11:27 AM   Modules accepted: Orders

## 2020-04-21 DIAGNOSIS — N952 Postmenopausal atrophic vaginitis: Secondary | ICD-10-CM | POA: Diagnosis not present

## 2020-04-21 DIAGNOSIS — N8111 Cystocele, midline: Secondary | ICD-10-CM | POA: Diagnosis not present

## 2020-04-21 DIAGNOSIS — Z6835 Body mass index (BMI) 35.0-35.9, adult: Secondary | ICD-10-CM | POA: Diagnosis not present

## 2020-04-21 DIAGNOSIS — N3281 Overactive bladder: Secondary | ICD-10-CM | POA: Diagnosis not present

## 2020-04-27 ENCOUNTER — Ambulatory Visit: Payer: Medicare Other | Admitting: Family Medicine

## 2020-04-28 ENCOUNTER — Ambulatory Visit: Payer: Medicare Other | Admitting: Osteopathic Medicine

## 2020-05-14 ENCOUNTER — Other Ambulatory Visit: Payer: Self-pay

## 2020-05-17 ENCOUNTER — Other Ambulatory Visit: Payer: Self-pay

## 2020-05-17 ENCOUNTER — Ambulatory Visit (INDEPENDENT_AMBULATORY_CARE_PROVIDER_SITE_OTHER): Payer: Medicare Other | Admitting: Family Medicine

## 2020-05-17 ENCOUNTER — Encounter: Payer: Self-pay | Admitting: Family Medicine

## 2020-05-17 VITALS — BP 128/72 | HR 93 | Temp 98.1°F | Wt 215.8 lb

## 2020-05-17 DIAGNOSIS — R079 Chest pain, unspecified: Secondary | ICD-10-CM

## 2020-05-17 DIAGNOSIS — F411 Generalized anxiety disorder: Secondary | ICD-10-CM | POA: Diagnosis not present

## 2020-05-17 DIAGNOSIS — K219 Gastro-esophageal reflux disease without esophagitis: Secondary | ICD-10-CM | POA: Diagnosis not present

## 2020-05-17 MED ORDER — LORAZEPAM 0.5 MG PO TABS: 0.5000 mg | ORAL_TABLET | Freq: Three times a day (TID) | ORAL | 0 refills | Status: DC | PRN

## 2020-05-17 MED ORDER — OMEPRAZOLE 40 MG PO CPDR: 40.0000 mg | DELAYED_RELEASE_CAPSULE | Freq: Every day | ORAL | 5 refills | Status: DC

## 2020-05-17 NOTE — Progress Notes (Signed)
Subjective:    Patient ID: Megan Crane, female    DOB: Nov 17, 1950, 70 y.o.   MRN: [id number]  HPI Here for several issues. First she says she has had several episodes over the past 2 weeks of pressure and pain in the left side of the chest which radiates down the left arm. These are quite brief, lasting less than 5 seconds each. There is no SOB or nausea or sweats. These are clearly related to her being stressed, and several of them occurred while she has having heated arguments with her daughter over the phone. These are never related to exertion. She also described having  Frequent heartburn lately and feeling the need to belch. When she belches she feels better. She takes TUMS at timed. We discussed the stress she is experiencing in dealing with her husband at our visit in March, and I gave her Lexapro to try. She has decided not to take this however because she is afraid if having side effects. She says she would rather have something that she can take on an as needed basis which would calm her down quickly. Of note she had a normal Spect Myoview in 2017.   Review of Systems  Constitutional: Negative.   Respiratory: Positive for chest tightness. Negative for cough, shortness of breath and wheezing.   Cardiovascular: Positive for chest pain. Negative for palpitations and leg swelling.  Gastrointestinal: Negative for abdominal distention, abdominal pain, constipation, diarrhea, nausea and vomiting.  Psychiatric/Behavioral: Positive for dysphoric mood and sleep disturbance. Negative for agitation, behavioral problems, confusion, decreased concentration and hallucinations. The patient is nervous/anxious.        Objective:   Physical Exam Constitutional:      General: She is not in acute distress.    Appearance: Normal appearance. She is well-developed.  Neck:     Thyroid: No thyromegaly.     Vascular: No JVD.  Cardiovascular:     Rate and Rhythm: Normal rate and regular rhythm.      Heart sounds: Normal heart sounds. No murmur heard. No friction rub. No gallop.      Comments: EKG today is normal  Pulmonary:     Effort: Pulmonary effort is normal. No respiratory distress.     Breath sounds: Normal breath sounds. No stridor. No wheezing or rales.  Chest:     Chest wall: No tenderness.  Abdominal:     General: Abdomen is flat. Bowel sounds are normal. There is no distension or abdominal bruit.     Palpations: Abdomen is soft. Abdomen is not rigid. There is no mass.     Tenderness: There is no abdominal tenderness. There is no guarding or rebound.     Hernia: No hernia is present.  Genitourinary:    Vagina: No erythema, tenderness or bleeding.     Cervix: No cervical motion tenderness, discharge or friability.     Adnexa:        Right: No mass, tenderness or fullness.         Left: No mass, tenderness or fullness.    Musculoskeletal:        General: Tenderness present.  Neurological:     Mental Status: She is alert.     Motor: No abnormal muscle tone.     Deep Tendon Reflexes: Reflexes are normal and symmetric.  Psychiatric:        Behavior: Behavior normal.        Thought Content: Thought content normal.  Judgment: Judgment normal.     Comments: Very anxious            Assessment & Plan:  She is dealing with a very stressful situation with her husband, and she plans on contacting [facility] to begin therapy asap. We agreed that she will not take Lexapro, and instead she will try Lorazepam 0.5 mg to take as needed. The stress is causing her GERD to flare up, and she seems to have some esophageal spasms at times. To treat this she will start taking Omeprazole 40 mg daily. Recheck as needed. We spent 50 minutes discussing these issues today. Alysia Penna, MD

## 2020-05-31 ENCOUNTER — Other Ambulatory Visit: Payer: Self-pay

## 2020-06-02 ENCOUNTER — Ambulatory Visit (INDEPENDENT_AMBULATORY_CARE_PROVIDER_SITE_OTHER): Payer: Medicare Other | Admitting: Family Medicine

## 2020-06-02 ENCOUNTER — Other Ambulatory Visit: Payer: Self-pay

## 2020-06-02 ENCOUNTER — Encounter: Payer: Self-pay | Admitting: Family Medicine

## 2020-06-02 VITALS — BP 124/80 | HR 69 | Temp 98.8°F | Wt 217.0 lb

## 2020-06-02 DIAGNOSIS — J309 Allergic rhinitis, unspecified: Secondary | ICD-10-CM | POA: Diagnosis not present

## 2020-06-02 DIAGNOSIS — H6121 Impacted cerumen, right ear: Secondary | ICD-10-CM | POA: Diagnosis not present

## 2020-06-02 NOTE — Progress Notes (Signed)
   Subjective:    Patient ID: Megan Crane, female    DOB: [birth year], 70 y.o.   MRN: [id number]  HPI Here for several issues. First she has had 4 days of decreased hearing in the right ear, ringing in the right ear, and pressure. No ear pain. Also for 2 weeks she has had stuffy head, PND, and a mild ST. No fever or cough, no body aches or NVD.    Review of Systems  Constitutional: Negative.   HENT: Positive for congestion, hearing loss, postnasal drip, sore throat and tinnitus. Negative for ear discharge and ear pain.   Eyes: Negative.   Respiratory: Negative.        Objective:   Physical Exam Constitutional:      Appearance: Normal appearance. She is not ill-appearing.  HENT:     Right Ear: There is impacted cerumen.     Left Ear: Tympanic membrane, ear canal and external ear normal.     Nose: Nose normal.     Mouth/Throat:     Pharynx: Oropharynx is clear.  Eyes:     Conjunctiva/sclera: Conjunctivae normal.  Cardiovascular:     Rate and Rhythm: Normal rate and regular rhythm.     Pulses: Normal pulses.     Heart sounds: Normal heart sounds.  Pulmonary:     Effort: Pulmonary effort is normal.     Breath sounds: Normal breath sounds.  Lymphadenopathy:     Cervical: No cervical adenopathy.  Neurological:     Mental Status: She is alert.           Assessment & Plan:  First she has a cerumen impaction. Informed consent was obtained to remove the cerumen. This was done successfully using a speculum. She tolerated the procedure well and afterwards her hearing was back to normal. She is also having symptoms of seasonal allergies. She can try Flonase and Zyrtec daily until the pollen slows down outside. We spent 45 minutes together dealing with these issues.  Alysia Penna, MD

## 2020-06-29 ENCOUNTER — Ambulatory Visit: Payer: Medicare Other | Admitting: Osteopathic Medicine

## 2020-07-07 ENCOUNTER — Other Ambulatory Visit: Payer: Self-pay

## 2020-07-08 ENCOUNTER — Encounter: Payer: Self-pay | Admitting: Family Medicine

## 2020-07-08 ENCOUNTER — Ambulatory Visit (INDEPENDENT_AMBULATORY_CARE_PROVIDER_SITE_OTHER): Payer: Medicare Other | Admitting: Family Medicine

## 2020-07-08 VITALS — BP 128/80 | HR 88 | Temp 98.2°F | Wt 217.4 lb

## 2020-07-08 DIAGNOSIS — B351 Tinea unguium: Secondary | ICD-10-CM

## 2020-07-08 DIAGNOSIS — B353 Tinea pedis: Secondary | ICD-10-CM

## 2020-07-08 MED ORDER — TERBINAFINE HCL 250 MG PO TABS: 250.0000 mg | ORAL_TABLET | Freq: Every day | ORAL | 1 refills | Status: DC

## 2020-07-08 MED ORDER — KETOCONAZOLE 2 % EX CREA: 1.0000 "application " | TOPICAL_CREAM | Freq: Two times a day (BID) | CUTANEOUS | 2 refills | Status: DC

## 2020-07-08 NOTE — Progress Notes (Signed)
   Subjective:    Patient ID: Megan Crane, female    DOB: 1950/12/16, 70 y.o.   MRN: [id number]  HPI Here to talk about her feet. For years she has had thickened toenails and they are beginning to dig into her toes. Also she has itchy areas of skin between the toes.    Review of Systems  Constitutional: Negative.   Respiratory: Negative.    Cardiovascular: Negative.   Skin:  Positive for rash.      Objective:   Physical Exam Constitutional:      Appearance: Normal appearance.  Cardiovascular:     Rate and Rhythm: Normal rate and regular rhythm.     Pulses: Normal pulses.     Heart sounds: Normal heart sounds.  Pulmonary:     Effort: Pulmonary effort is normal.     Breath sounds: Normal breath sounds.  Skin:    Comments: All toenails are yellow and thickened. There are areas of erythema and scaling between several toes on both feet   Neurological:     Mental Status: She is alert.          Assessment & Plan:  For the onychomycosis, treat with Terbiniafine daily for 3-6 months. For the tinea pedis, treat with Ketoconazole cream BID.  Alysia Penna, MD

## 2020-07-21 ENCOUNTER — Encounter: Payer: Self-pay | Admitting: *Deleted

## 2020-07-21 ENCOUNTER — Other Ambulatory Visit: Payer: Self-pay

## 2020-07-21 ENCOUNTER — Emergency Department (INDEPENDENT_AMBULATORY_CARE_PROVIDER_SITE_OTHER)
Admission: EM | Admit: 2020-07-21 | Discharge: 2020-07-21 | Disposition: A | Discharge status: Home / Self Care | Payer: Medicare Other | Source: Home / Self Care | Attending: Family Medicine | Admitting: Family Medicine

## 2020-07-21 DIAGNOSIS — B351 Tinea unguium: Secondary | ICD-10-CM | POA: Diagnosis not present

## 2020-07-21 DIAGNOSIS — B353 Tinea pedis: Secondary | ICD-10-CM

## 2020-07-21 MED ORDER — VALACYCLOVIR HCL 1 G PO TABS: 1000.0000 mg | ORAL_TABLET | Freq: Three times a day (TID) | ORAL | 0 refills | Status: DC

## 2020-07-21 MED ORDER — CLOTRIMAZOLE-BETAMETHASONE 1-0.05 % EX CREA: TOPICAL_CREAM | CUTANEOUS | 0 refills | Status: DC

## 2020-07-21 NOTE — Discharge Instructions (Addendum)
The new cream, Lotrisone, will treat the fungal infection and the itching Keep feet dry See Dr. Sheppard Coil as scheduled

## 2020-07-21 NOTE — ED Triage Notes (Addendum)
Pt c/o  toe nail fungus x 1 year. Podiatrist gave her Terbinafine, but she didn't take it. She has used Ketoconzale cream that she states caused the rash to spread. She got Terbinafine cream this week from  [facility] with some relief. She also reports itching on her back and lower legs.  She has an appt to establish with Dr Sheppard Coil in primary care on 08/02/20.

## 2020-07-21 NOTE — ED Provider Notes (Signed)
Megan Crane CARE    CSN: [id number] Arrival date & time: 07/21/20  0933      History   Chief Complaint Chief Complaint  Patient presents with   Nail Problem    HPI Megan Crane is a 70 y.o. female.   HPI  Patient is here for fungal infection both feet.  She has chronic onychomycosis of toenails with pincher toenails and thickening, chronic pain.  She also has a new onset of rash spreading from the toenails at the sides of the great toes that appears fungal, erythema, raised border, scaling, central slight clearing. Patient's has seen a physician many times for this.  He has prescribed terbinafine.  She refused to take the oral terbinafine because the provider did not feel she needed regular liver function test.  She has used topical terbinafine with no improvement.  She has used topical ketoconazole with no improvement.  She has seen a podiatrist.  She refused to allow them to remove her toenails.  I explained to her with pincer nail deformities, surgery is likely her only option. She has multiple medical problems that are documented in the chart. She is changing primary care doctors because of her prior doctors refusal to do liver tests  Past Medical History:  Diagnosis Date   Anxiety disorder    hx vasovagal responses   Headache(784.0)    History of kidney stones    Hydronephrosis, right    Hyperlipidemia    Hypertension    Nephrolithiasis    Positive nasal culture for methicillin resistant Staphylococcus aureus    Right ureteral stone    Urinary incontinence    sees Dr. McDiarmid     Patient Active Problem List   Diagnosis Date Noted   Primary osteoarthritis involving multiple joints 11/24/2019   GERD (gastroesophageal reflux disease) 12/26/2016   Edema 05/29/2014   Nasal colonization with methicillin-resistant Staphylococcus aureus 01/22/2014   Varicose veins of bilateral lower extremities with other complications 42/35/3614   Allergic rhinitis  11/29/2009   MOTION SICKNESS 11/29/2009   Hyperlipidemia 08/31/2008   Anxiety state 08/31/2008   Essential hypertension 08/31/2008   PILONIDAL CYST 08/31/2008   HEADACHE 08/31/2008   URINARY INCONTINENCE 08/31/2008   NEPHROLITHIASIS, HX OF 08/31/2008   POSTNASAL DRIP SYNDROME 05/28/2006    Past Surgical History:  Procedure Laterality Date   COLONOSCOPY  12/28/2015   per Dr. Havery Moros, serrated polyps and diverticula, repeat in 3 yrs    CYSTOSCOPY  06/09/2011   Procedure: CYSTOSCOPY;  Surgeon: Reece Packer, MD;  Location: [facility];  Service: Urology;  Laterality: N/A;   CYSTOSCOPY W/ URETERAL STENT PLACEMENT  02/03/2014   Procedure: CYSTOSCOPY WITH  RIGHT RETROGRADE PYELOGRAM/ RIGHT URETERAL STENT PLACEMENT;  Surgeon: Malka So, MD;  Location: [facility];  Service: Urology;;   CYSTOSCOPY WITH BIOPSY  02/03/2014   Procedure: CYSTOSCOPY WITH BIOPSY;  Surgeon: Malka So, MD;  Location: [facility];  Service: Urology;;   PILONIDAL CYST [place]  06/09/2011   Procedure: POSTERIOR REPAIR (RECTOCELE);  Surgeon: Reece Packer, MD;  Location: [facility];  Service: Urology;  Laterality: N/A;  Xenform graft 6x10   RIGHT URETEROSCOPIC STONE EXTRACTION / STENT PLACEMENT  03-13-2000   URETEROSCOPY Right 02/03/2014   Procedure: URETEROSCOPY WITH MANAGEMENT OF URETERAL STRICTURE;  Surgeon: Malka So, MD;  Location: [facility];  Service: Urology;  Laterality: Right;   VAGINAL HYSTERECTOMY  06/09/2011   Procedure: HYSTERECTOMY  VAGINAL;  Surgeon: Sharene Butters, MD;  Location: [facility];  Service: Gynecology;  Laterality: N/A;   VAGINAL PROLAPSE REPAIR  06/09/2011   Procedure: VAGINAL VAULT SUSPENSION;  Surgeon: Reece Packer, MD;  Location: [facility];  Service: Urology;  Laterality: N/A;   VEIN LIGATION AND STRIPPING     LEFT LEG    OB History   No obstetric history on file.      Home Medications    Prior to  Admission medications   Medication Sig Start Date End Date Taking? Authorizing Provider  aspirin 81 MG EC tablet Take 81 mg by mouth daily. Swallow whole.   Yes [provider]  cetirizine (ZYRTEC) 10 MG tablet Take 1 tablet (10 mg total) by mouth daily. 02/03/20  Yes Laurey Morale, MD  clotrimazole-betamethasone Donalynn Furlong) cream Apply to affected area 2 times daily prn rash 07/21/20  Yes Raylene Everts, MD  fluticasone [facility]) 50 MCG/ACT nasal spray Place 1 spray into both nostrils daily. 02/03/20  Yes Laurey Morale, MD  hydrochlorothiazide (HYDRODIURIL) 25 MG tablet Take 1 tablet (25 mg total) by mouth daily. 09/03/19  Yes Laurey Morale, MD  ketoconazole (NIZORAL) 2 % cream Apply 1 application topically 2 (two) times daily. 07/08/20  Yes Laurey Morale, MD  omeprazole (PRILOSEC) 40 MG capsule Take 1 capsule (40 mg total) by mouth daily. 05/17/20  Yes Laurey Morale, MD  valsartan (DIOVAN) 40 MG tablet TAKE ONE TABLET BY MOUTH DAILY APPOINTMENT NEEDED FOR MORE REFILLS. 09/03/19  Yes Laurey Morale, MD  LORazepam (ATIVAN) 0.5 MG tablet Take 1 tablet (0.5 mg total) by mouth every 8 (eight) hours as needed for anxiety. 05/17/20   Laurey Morale, MD  NON FORMULARY     [provider]  solifenacin (VESICARE) 5 MG tablet Take 1 tablet (5 mg total) by mouth daily. 09/03/19   Laurey Morale, MD  terbinafine (LAMISIL) 250 MG tablet Take 1 tablet (250 mg total) by mouth daily. 07/08/20   Laurey Morale, MD    Family History Family History  Problem Relation Age of Onset   Cancer Father        kidney, bladder    Coronary artery disease Other    Diabetes Other    Hyperlipidemia Other    Kidney disease Other    Cancer Other        lung   Colon cancer Mother 72    Social History Social History   Tobacco Use   Smoking status: Never   Smokeless tobacco: Never  Vaping Use   Vaping Use: Never used  Substance Use Topics   Alcohol use: Yes    Alcohol/week: 0.0 standard drinks     Comment: occ   Drug use: No     Allergies   Levofloxacin, Levofloxacin, Lidocaine, and Percodan [oxycodone-aspirin]   Review of Systems Review of Systems See HPI  Physical Exam Triage Vital Signs ED Triage Vitals  Enc Vitals Group     BP 07/21/20 0959 138/78     Pulse Rate 07/21/20 0959 82     Resp 07/21/20 0959 18     Temp 07/21/20 0959 98.3 F (36.8 C)     Temp Source 07/21/20 0959 Oral     SpO2 07/21/20 0959 97 %     Weight 07/21/20 0955 217 lb (98.4 kg)     Height 07/21/20 0955 5' 6.5" (1.689 m)     Head Circumference --  Peak Flow --      Pain Score 07/21/20 0955 0     Pain Loc --      Pain Edu? --      Excl. in [place]? --    No data found.  Updated Vital Signs BP 138/78 (BP Location: Right Arm)   Pulse 82   Temp 98.3 F (36.8 C) (Oral)   Resp 18   Ht 5' 6.5" (1.689 m)   Wt 98.4 kg   SpO2 97%   BMI 34.50 kg/m       Physical Exam Constitutional:      General: She is not in acute distress.    Appearance: She is well-developed.     Comments: Overweight  HENT:     Head: Normocephalic and atraumatic.     Mouth/Throat:     Comments: Mask in place Eyes:     Conjunctiva/sclera: Conjunctivae normal.     Pupils: Pupils are equal, round, and reactive to light.  Cardiovascular:     Rate and Rhythm: Normal rate.  Pulmonary:     Effort: Pulmonary effort is normal. No respiratory distress.  Abdominal:     General: There is no distension.     Palpations: Abdomen is soft.  Musculoskeletal:        General: Normal range of motion.     Cervical back: Normal range of motion.  Skin:    General: Skin is warm and dry.     Comments: Both great toenails have lateral deviation.  Pincher toe.  Thickening and crusting under the edge.  Also has a rash on her toes as described  Neurological:     Mental Status: She is alert.     UC Treatments / Results  Labs (all labs ordered are listed, but only abnormal results are displayed) Labs Reviewed - No data to  display  EKG   Radiology No results found.  Procedures Procedures (including critical care time)  Medications Ordered in UC Medications - No data to display  Initial Impression / Assessment and Plan / UC Course  I have reviewed the triage vital signs and the nursing notes.  Pertinent labs & imaging results that were available during my care of the patient were reviewed by me and considered in my medical decision making (see chart for details).     I explained to the patient that we cannot do chronic management at an urgent care office and I would not be recommending medication.  This should be handled by her next PCP.  I did explain to her that the terbinafine is generally safe and infrequently causes liver problems. She wants to be treated with topicals.  I am uncertain what is causing her rash but she says it itches terribly.  We will give her Lotrisone for symptom relief until she can see her new primary care Final Clinical Impressions(s) / UC Diagnoses   Final diagnoses:  Tinea pedis of both feet  Onychomycosis     Discharge Instructions      The new cream, Lotrisone, will treat the fungal infection and the itching Keep feet dry See Dr. Sheppard Coil as scheduled   ED Prescriptions     Medication Sig Dispense Auth. Provider   clotrimazole-betamethasone (LOTRISONE) cream Apply to affected area 2 times daily prn rash 30 g Raylene Everts, MD      PDMP not reviewed this encounter.   Raylene Everts, MD 07/21/20 Jeri Lager

## 2020-08-02 ENCOUNTER — Encounter: Payer: Self-pay | Admitting: Osteopathic Medicine

## 2020-08-02 ENCOUNTER — Ambulatory Visit (INDEPENDENT_AMBULATORY_CARE_PROVIDER_SITE_OTHER): Payer: Medicare Other | Admitting: Osteopathic Medicine

## 2020-08-02 ENCOUNTER — Other Ambulatory Visit: Payer: Self-pay

## 2020-08-02 VITALS — BP 127/64 | HR 80 | Temp 97.8°F | Ht 66.0 in | Wt 219.0 lb

## 2020-08-02 DIAGNOSIS — R0789 Other chest pain: Secondary | ICD-10-CM | POA: Diagnosis not present

## 2020-08-02 DIAGNOSIS — Z833 Family history of diabetes mellitus: Secondary | ICD-10-CM

## 2020-08-02 DIAGNOSIS — I10 Essential (primary) hypertension: Secondary | ICD-10-CM | POA: Diagnosis not present

## 2020-08-02 DIAGNOSIS — B351 Tinea unguium: Secondary | ICD-10-CM

## 2020-08-02 DIAGNOSIS — F411 Generalized anxiety disorder: Secondary | ICD-10-CM

## 2020-08-02 DIAGNOSIS — N3281 Overactive bladder: Secondary | ICD-10-CM

## 2020-08-02 DIAGNOSIS — I83893 Varicose veins of bilateral lower extremities with other complications: Secondary | ICD-10-CM | POA: Diagnosis not present

## 2020-08-02 MED ORDER — VALSARTAN 40 MG PO TABS: 40.0000 mg | ORAL_TABLET | Freq: Every day | ORAL | 3 refills | Status: DC

## 2020-08-02 MED ORDER — HYDROCHLOROTHIAZIDE 25 MG PO TABS: 25.0000 mg | ORAL_TABLET | Freq: Every day | ORAL | 3 refills | Status: DC

## 2020-08-02 NOTE — Patient Instructions (Addendum)
Refilled the hydrochlorothiazide and the valsartan Restart the terbinafine for nail fungus assuming labs are ok  Consider the escitalopram for anxiety  Will keep an eye on veins

## 2020-08-02 NOTE — Progress Notes (Signed)
Megan Crane is a 70 y.o. female who presents to  [place] at [facility]  today, 08/02/20, seeking care for the following:  ESTABLISH CARE - see a/p below      ASSESSMENT & PLAN with other pertinent findings:  The primary encounter diagnosis was Chest pressure. Diagnoses of Family history of diabetes mellitus, Onychomycosis, Varicose veins of bilateral lower extremities with other complications, Overactive bladder, Essential hypertension, and Anxiety state were also pertinent to this visit.   1. Chest pressure Benign, reports w/ anxiety episodes, no exertional symptoms, pt would like to defer cariac w/u, I think low risk but offered sterss test / cardiology referral at any rate to further evaluate as needed   2. Family history of diabetes mellitus Labs pending   3. Onychomycosis Terminafine if AST/ALT ok   4. Varicose veins of bilateral lower extremities with other complications Stable, uncomplicated  5. Overactive bladder Continue meds   6. Essential hypertension BP Readings from Last 3 Encounters:  08/02/20 127/64  07/21/20 138/78  07/08/20 128/80  Stable and at goal Continue meds   7. Anxiety state Discussed option for medications if desired, pt reports longstanding anxiety relatively stable, would like to defer emds for now but will keep this in mind if symptoms worsen   Patient Instructions  Refilled the hydrochlorothiazide and the valsartan Restart the terbinafine for nail fungus assuming labs are ok  Consider the escitalopram for anxiety  Will keep an eye on veins        Orders Placed This Encounter  Procedures   COMPLETE METABOLIC PANEL WITH GFR   Lipid panel    Meds ordered this encounter  Medications   valsartan (DIOVAN) 40 MG tablet    Sig: Take 1 tablet (40 mg total) by mouth daily.    Dispense:  90 tablet    Refill:  3   hydrochlorothiazide (HYDRODIURIL) 25 MG tablet    Sig: Take 1 tablet (25  mg total) by mouth daily.    Dispense:  90 tablet    Refill:  3     See below for relevant physical exam findings  See below for recent lab and imaging results reviewed  Medications, allergies, PMH, PSH, SocH, [place] reviewed below    Follow-up instructions: Return in about 6 weeks (around 09/13/2020) for RECHECK [place] W/ DR Sheppard Coil.                                        Exam:  BP 127/64 (BP Location: Left Arm, Patient Position: Sitting, Cuff Size: Large)   Pulse 80   Temp 97.8 F (36.6 C) (Oral)   Ht '5\' 6"'  (1.676 m)   Wt 219 lb 0.6 oz (99.4 kg)   BMI 35.35 kg/m  Constitutional: VS see above. General Appearance: alert, well-developed, well-nourished, NAD Neck: No masses, trachea midline.  Respiratory: Normal respiratory effort. no wheeze, no rhonchi, no rales Cardiovascular: S1/S2 normal, no murmur, no rub/gallop auscultated. RRR.  Musculoskeletal: Gait normal. Symmetric and independent movement of all extremities Abdominal: non-tender, non-distended, no appreciable organomegaly, neg Murphy's, BS WNLx4 Neurological: Normal balance/coordination. No tremor. Skin: warm, dry, intact.  Psychiatric: Normal judgment/insight. Normal mood and affect. Oriented x3.   Current Meds  Medication Sig   aspirin 81 MG EC tablet Take 81 mg by mouth daily. Swallow whole.   clotrimazole-betamethasone (LOTRISONE) cream Apply  to affected area 2 times daily prn rash   fluticasone (FLONASE) 50 MCG/ACT nasal spray Place 1 spray into both nostrils daily.   solifenacin (VESICARE) 5 MG tablet Take 1 tablet (5 mg total) by mouth daily.   [DISCONTINUED] hydrochlorothiazide (HYDRODIURIL) 25 MG tablet Take 1 tablet (25 mg total) by mouth daily.   [DISCONTINUED] omeprazole (PRILOSEC) 40 MG capsule Take 1 capsule (40 mg total) by mouth daily.   [DISCONTINUED] valsartan (DIOVAN) 40 MG tablet TAKE ONE TABLET BY MOUTH DAILY APPOINTMENT NEEDED FOR MORE  REFILLS.   Current Facility-Administered Medications for the 08/02/20 encounter (Office Visit) with Emeterio Reeve, DO  Medication   0.9 %  sodium chloride infusion    Allergies  Allergen Reactions   Levofloxacin Itching and Other (See Comments)    disoriented   Lidocaine Other (See Comments)    "Eyes crossed"   Percodan [Oxycodone-Aspirin] Other (See Comments)    "VERY DIZZY"    Patient Active Problem List   Diagnosis Date Noted   Primary osteoarthritis involving multiple joints 11/24/2019   GERD (gastroesophageal reflux disease) 12/26/2016   Edema 05/29/2014   Nasal colonization with methicillin-resistant Staphylococcus aureus 01/22/2014   Varicose veins of bilateral lower extremities with other complications 86/75/4492   Allergic rhinitis 11/29/2009   MOTION SICKNESS 11/29/2009   Hyperlipidemia 08/31/2008   Anxiety state 08/31/2008   Essential hypertension 08/31/2008   PILONIDAL CYST 08/31/2008   HEADACHE 08/31/2008   URINARY INCONTINENCE 08/31/2008   NEPHROLITHIASIS, HX OF 08/31/2008   POSTNASAL DRIP SYNDROME 05/28/2006    Family History  Problem Relation Age of Onset   Cancer Father        kidney, bladder    Coronary artery disease Other    Diabetes Other    Hyperlipidemia Other    Kidney disease Other    Cancer Other        lung   Colon cancer Mother 14    Social History   Tobacco Use  Smoking Status Never  Smokeless Tobacco Never    Past Surgical History:  Procedure Laterality Date   COLONOSCOPY  12/28/2015   per Dr. Havery Moros, serrated polyps and diverticula, repeat in 3 yrs    CYSTOSCOPY  06/09/2011   Procedure: CYSTOSCOPY;  Surgeon: Reece Packer, MD;  Location: [facility];  Service: Urology;  Laterality: N/A;   CYSTOSCOPY W/ URETERAL STENT PLACEMENT  02/03/2014   Procedure: CYSTOSCOPY WITH  RIGHT RETROGRADE PYELOGRAM/ RIGHT URETERAL STENT PLACEMENT;  Surgeon: Malka So, MD;  Location: [facility];  Service: Urology;;    CYSTOSCOPY WITH BIOPSY  02/03/2014   Procedure: CYSTOSCOPY WITH BIOPSY;  Surgeon: Malka So, MD;  Location: [facility];  Service: Urology;;   PILONIDAL CYST [place]  06/09/2011   Procedure: POSTERIOR REPAIR (RECTOCELE);  Surgeon: Reece Packer, MD;  Location: [facility];  Service: Urology;  Laterality: N/A;  Xenform graft 6x10   RIGHT URETEROSCOPIC STONE EXTRACTION / STENT PLACEMENT  03-13-2000   URETEROSCOPY Right 02/03/2014   Procedure: URETEROSCOPY WITH MANAGEMENT OF URETERAL STRICTURE;  Surgeon: Malka So, MD;  Location: [facility];  Service: Urology;  Laterality: Right;   VAGINAL HYSTERECTOMY  06/09/2011   Procedure: HYSTERECTOMY VAGINAL;  Surgeon: Sharene Butters, MD;  Location: [facility];  Service: Gynecology;  Laterality: N/A;   VAGINAL PROLAPSE REPAIR  06/09/2011   Procedure: VAGINAL VAULT SUSPENSION;  Surgeon: Reece Packer, MD;  Location: [facility];  Service: Urology;  Laterality: N/A;   VEIN LIGATION AND STRIPPING     LEFT LEG    Immunization History  Administered Date(s) Administered   Fluad Quad(high Dose 65+) 09/10/2018, 11/24/2019   Influenza Split 11/23/2010   Influenza Whole 10/20/2008   Influenza,inj,Quad PF,6+ Mos 12/11/2012, 09/19/2013, 12/22/2014, 09/29/2015, 09/21/2016, 10/05/2017   Influenza-Unspecified 09/10/2015   PFIZER(Purple Top)SARS-COV-2 Vaccination 02/18/2019, 03/13/2019, 10/30/2019   Pneumococcal Conjugate-13 10/02/2018   Tdap 07/22/2012    Recent Results (from the past 2160 hour(s))  COMPLETE METABOLIC PANEL WITH GFR     Status: None   Collection Time: 08/03/20 12:00 AM  Result Value Ref Range   Glucose, Bld 91 65 - 99 mg/dL    Comment: .            Fasting reference interval .    BUN 17 7 - 25 mg/dL   Creat 0.80 0.50 - 1.05 mg/dL   eGFR 81 > OR = 60 mL/min/1.41m    Comment: The eGFR is based on the CKD-EPI 2021 equation. To calculate  the new eGFR from a previous Creatinine or  Cystatin C result, go to https://www.kidney.org/professionals/ kdoqi/gfr%5Fcalculator    BUN/Creatinine Ratio NOT APPLICABLE 6 - 22 (calc)   Sodium 139 135 - 146 mmol/L   Potassium 4.7 3.5 - 5.3 mmol/L   Chloride 104 98 - 110 mmol/L   CO2 31 20 - 32 mmol/L   Calcium 10.1 8.6 - 10.4 mg/dL   Total Protein 6.9 6.1 - 8.1 g/dL   Albumin 4.0 3.6 - 5.1 g/dL   Globulin 2.9 1.9 - 3.7 g/dL (calc)   AG Ratio 1.4 1.0 - 2.5 (calc)   Total Bilirubin 0.5 0.2 - 1.2 mg/dL   Alkaline phosphatase (APISO) 67 37 - 153 U/L   AST 18 10 - 35 U/L   ALT 19 6 - 29 U/L  Lipid panel     Status: Abnormal   Collection Time: 08/03/20 12:00 AM  Result Value Ref Range   Cholesterol 226 (H) <200 mg/dL   HDL 46 (L) > OR = 50 mg/dL   Triglycerides 164 (H) <150 mg/dL   LDL Cholesterol (Calc) 150 (H) mg/dL (calc)    Comment: Reference range: <100 . Desirable range <100 mg/dL for primary prevention;   <70 mg/dL for patients with CHD or diabetic patients  with > or = 2 CHD risk factors. .Marland KitchenLDL-C is now calculated using the Martin-Hopkins  calculation, which is a validated novel method providing  better accuracy than the Friedewald equation in the  estimation of LDL-C.  MCresenciano Genreet al. JAnnamaria Helling 27543;606(77: [id number]  (http://education.[employer].com/faq/FAQ164)    Total CHOL/HDL Ratio 4.9 <5.0 (calc)   Non-HDL Cholesterol (Calc) 180 (H) <130 mg/dL (calc)    Comment: For patients with diabetes plus 1 major ASCVD risk  factor, treating to a non-HDL-C goal of <100 mg/dL  (LDL-C of <70 mg/dL) is considered a therapeutic  option.     No results found.     All questions at time of visit were answered - patient instructed to contact office with any additional concerns or updates. ER/RTC precautions were reviewed with the patient as applicable.   Please note: manual typing as well as voice recognition software may have been used to produce this document - typos may escape review. Please contact Dr.  ASheppard Coilfor any needed clarifications.

## 2020-08-03 ENCOUNTER — Other Ambulatory Visit: Payer: Medicare Other

## 2020-08-03 DIAGNOSIS — R0789 Other chest pain: Secondary | ICD-10-CM | POA: Diagnosis not present

## 2020-08-03 DIAGNOSIS — M25561 Pain in right knee: Secondary | ICD-10-CM | POA: Diagnosis not present

## 2020-08-03 DIAGNOSIS — B351 Tinea unguium: Secondary | ICD-10-CM | POA: Diagnosis not present

## 2020-08-03 DIAGNOSIS — Z833 Family history of diabetes mellitus: Secondary | ICD-10-CM | POA: Diagnosis not present

## 2020-08-03 DIAGNOSIS — M25551 Pain in right hip: Secondary | ICD-10-CM | POA: Diagnosis not present

## 2020-08-03 DIAGNOSIS — M1711 Unilateral primary osteoarthritis, right knee: Secondary | ICD-10-CM | POA: Diagnosis not present

## 2020-08-03 LAB — LIPID PANEL
Cholesterol: 226 mg/dL — ABNORMAL HIGH (ref <200)
HDL: 46 mg/dL — ABNORMAL LOW (ref >=50)
LDL Cholesterol (Calc): 150 mg/dL (calc) — ABNORMAL HIGH
Non-HDL Cholesterol (Calc): 180 mg/dL (calc) — ABNORMAL HIGH (ref <130)
Total CHOL/HDL Ratio: 4.9 (calc) (ref <5.0)
Triglycerides: 164 mg/dL — ABNORMAL HIGH (ref <150)

## 2020-08-03 LAB — COMPLETE METABOLIC PANEL WITH GFR
AG Ratio: 1.4 (calc) (ref 1.0–2.5)
ALT: 19 U/L (ref 6–29)
AST: 18 U/L (ref 10–35)
Albumin: 4 g/dL (ref 3.6–5.1)
Alkaline phosphatase (APISO): 67 U/L (ref 37–153)
BUN: 17 mg/dL (ref 7–25)
CO2: 31 mmol/L (ref 20–32)
Calcium: 10.1 mg/dL (ref 8.6–10.4)
Chloride: 104 mmol/L (ref 98–110)
Creat: 0.8 mg/dL (ref 0.50–1.05)
Globulin: 2.9 g/dL (calc) (ref 1.9–3.7)
Glucose, Bld: 91 mg/dL (ref 65–99)
Potassium: 4.7 mmol/L (ref 3.5–5.3)
Sodium: 139 mmol/L (ref 135–146)
Total Bilirubin: 0.5 mg/dL (ref 0.2–1.2)
Total Protein: 6.9 g/dL (ref 6.1–8.1)
eGFR: 81 mL/min/{1.73_m2} (ref >=60)

## 2020-08-20 ENCOUNTER — Ambulatory Visit (INDEPENDENT_AMBULATORY_CARE_PROVIDER_SITE_OTHER): Payer: Medicare Other | Admitting: Osteopathic Medicine

## 2020-08-20 ENCOUNTER — Encounter: Payer: Self-pay | Admitting: Osteopathic Medicine

## 2020-08-20 ENCOUNTER — Other Ambulatory Visit: Payer: Self-pay

## 2020-08-20 VITALS — BP 129/77 | HR 79 | Temp 98.2°F | Wt 218.1 lb

## 2020-08-20 DIAGNOSIS — R102 Pelvic and perineal pain: Secondary | ICD-10-CM

## 2020-08-20 DIAGNOSIS — Z201 Contact with and (suspected) exposure to tuberculosis: Secondary | ICD-10-CM | POA: Diagnosis not present

## 2020-08-20 DIAGNOSIS — Z111 Encounter for screening for respiratory tuberculosis: Secondary | ICD-10-CM

## 2020-08-20 DIAGNOSIS — Z1211 Encounter for screening for malignant neoplasm of colon: Secondary | ICD-10-CM

## 2020-08-20 DIAGNOSIS — N3010 Interstitial cystitis (chronic) without hematuria: Secondary | ICD-10-CM

## 2020-08-20 LAB — POCT URINALYSIS DIP (CLINITEK)
Bilirubin, UA: NEGATIVE
Blood, UA: NEGATIVE
Glucose, UA: NEGATIVE mg/dL
Ketones, POC UA: NEGATIVE mg/dL
Leukocytes, UA: NEGATIVE
Nitrite, UA: NEGATIVE
POC PROTEIN,UA: NEGATIVE
Spec Grav, UA: 1.015 (ref 1.010–1.025)
Urobilinogen, UA: 0.2 E.U./dL
pH, UA: 7 (ref 5.0–8.0)

## 2020-08-20 NOTE — Patient Instructions (Addendum)
Will get urine testing today - looks totally normal in office, await lab testing Will get blood for TB testing  Will have form for school ready next week Referral to GI for colonoscopy - [place] will call you!

## 2020-08-20 NOTE — Progress Notes (Signed)
Megan Crane is a 70 y.o. female who presents to  [place] at [facility]  today, 08/20/20, seeking care for the following:  Lower pelvic pain, concern about bladder issues.  Previously diagnosed with interstitial cystitis, last bout with this was maybe a year or so ago, patient denies dysuria/hematuria at this point, recently had some concern for unusual bowel movements after eating some raw cookie dough, thinks this might also have something to do with lower abdominal pain.  She reports feeling like a pulling sensation in the uterus/rectum area, similar to after giving childbirth.  She reports today that the pain has overall dissipated though is not completely resolved compared to when she first called to make the appointment several days ago.      ASSESSMENT & PLAN with other pertinent findings:  The primary encounter diagnosis was Pelvic pain. Diagnoses of Screening-pulmonary TB, Interstitial cystitis, and Colon cancer screening were also pertinent to this visit.   Differential diagnosis includes transient functional bowel disorder, irritable bowel disorder, interstitial cystitis, UTI.  Patient is due either way for colonoscopy to follow-up on previous, low suspicion for UTI given urinalysis results in the office but urine culture is currently pending.  Patient Instructions  Will get urine testing today - looks totally normal in office, await lab testing Will get blood for TB testing  Will have form for school ready next week Referral to GI for colonoscopy - [facility] will call you!      Orders Placed This Encounter  Procedures   Urine Culture   Urinalysis, Routine w reflex microscopic   QuantiFERON-TB Gold Plus   Ambulatory referral to Gastroenterology   POCT URINALYSIS DIP (CLINITEK)    No orders of the defined types were placed in this encounter.    See below for relevant physical exam findings  See below for recent lab and  imaging results reviewed  Medications, allergies, PMH, PSH, SocH, FamH reviewed below    Follow-up instructions: Return if symptoms worsen or fail to improve, for KEEP CURRENTLY SCHEDULED APPOINTMENT.                                        Exam:  BP 129/77 (BP Location: Left Arm, Patient Position: Sitting, Cuff Size: Large)   Pulse 79   Temp 98.2 F (36.8 C) (Oral)   Wt 218 lb 1.9 oz (98.9 kg)   BMI 35.21 kg/m  Constitutional: VS see above. General Appearance: alert, well-developed, well-nourished, NAD Neck: No masses, trachea midline.  Respiratory: Normal respiratory effort. no wheeze, no rhonchi, no rales Cardiovascular: S1/S2 normal, no murmur, no rub/gallop auscultated. RRR.  Musculoskeletal: Gait normal. Symmetric and independent movement of all extremities Abdominal: non-tender, non-distended, no appreciable organomegaly, neg Murphy's, BS WNLx4 Neurological: Normal balance/coordination. No tremor. Skin: warm, dry, intact.  Psychiatric: Normal judgment/insight. Normal mood and affect. Oriented x3.   Current Meds  Medication Sig   aspirin 81 MG EC tablet Take 81 mg by mouth daily. Swallow whole.   clotrimazole-betamethasone (LOTRISONE) cream Apply to affected area 2 times daily prn rash   fluticasone (FLONASE) 50 MCG/ACT nasal spray Place 1 spray into both nostrils daily.   hydrochlorothiazide (HYDRODIURIL) 25 MG tablet Take 1 tablet (25 mg total) by mouth daily.   solifenacin (VESICARE) 5 MG tablet Take 1 tablet (5 mg total) by mouth daily.   valsartan (DIOVAN) 40 MG tablet  Take 1 tablet (40 mg total) by mouth daily.   Current Facility-Administered Medications for the 08/20/20 encounter (Office Visit) with Emeterio Reeve, DO  Medication   0.9 %  sodium chloride infusion    Allergies  Allergen Reactions   Lidocaine Other (See Comments), Anaphylaxis and Shortness Of Breath    "Eyes crossed" Other reaction(s): Other (See  Comments) "Eyes crossed"   Escitalopram     Other reaction(s): Other (See Comments) Sleepy Sleepy   Levofloxacin Itching and Other (See Comments)    disoriented   Levofloxacin Itching and Other (See Comments)    disoriented Other reaction(s): Other (See Comments), Other (See Comments) disoriented disoriented disoriented   Oxycodone-Aspirin Other (See Comments)    "VERY DIZZY" Other reaction(s): Other (See Comments) Dizzy "VERY DIZZY"    Patient Active Problem List   Diagnosis Date Noted   Primary osteoarthritis involving multiple joints 11/24/2019   GERD (gastroesophageal reflux disease) 12/26/2016   Edema 05/29/2014   Nasal colonization with methicillin-resistant Staphylococcus aureus 01/22/2014   Varicose veins of bilateral lower extremities with other complications 04/54/0981   Allergic rhinitis 11/29/2009   MOTION SICKNESS 11/29/2009   Hyperlipidemia 08/31/2008   Anxiety state 08/31/2008   Essential hypertension 08/31/2008   PILONIDAL CYST 08/31/2008   HEADACHE 08/31/2008   URINARY INCONTINENCE 08/31/2008   NEPHROLITHIASIS, HX OF 08/31/2008   POSTNASAL DRIP SYNDROME 05/28/2006    Family History  Problem Relation Age of Onset   Cancer Father        kidney, bladder    Coronary artery disease Other    Diabetes Other    Hyperlipidemia Other    Kidney disease Other    Cancer Other        lung   Colon cancer Mother 37    Social History   Tobacco Use  Smoking Status Never  Smokeless Tobacco Never    Past Surgical History:  Procedure Laterality Date   COLONOSCOPY  12/28/2015   per Dr. Havery Moros, serrated polyps and diverticula, repeat in 3 yrs    CYSTOSCOPY  06/09/2011   Procedure: CYSTOSCOPY;  Surgeon: Reece Packer, MD;  Location: [facility];  Service: Urology;  Laterality: N/A;   CYSTOSCOPY W/ URETERAL STENT PLACEMENT  02/03/2014   Procedure: CYSTOSCOPY WITH  RIGHT RETROGRADE PYELOGRAM/ RIGHT URETERAL STENT PLACEMENT;  Surgeon: Malka So, MD;   Location: [facility];  Service: Urology;;   CYSTOSCOPY WITH BIOPSY  02/03/2014   Procedure: CYSTOSCOPY WITH BIOPSY;  Surgeon: Malka So, MD;  Location: [facility];  Service: Urology;;   PILONIDAL CYST [place]  06/09/2011   Procedure: POSTERIOR REPAIR (RECTOCELE);  Surgeon: Reece Packer, MD;  Location: [facility];  Service: Urology;  Laterality: N/A;  Xenform graft 6x10   RIGHT URETEROSCOPIC STONE EXTRACTION / STENT PLACEMENT  03-13-2000   URETEROSCOPY Right 02/03/2014   Procedure: URETEROSCOPY WITH MANAGEMENT OF URETERAL STRICTURE;  Surgeon: Malka So, MD;  Location: [facility];  Service: Urology;  Laterality: Right;   VAGINAL HYSTERECTOMY  06/09/2011   Procedure: HYSTERECTOMY VAGINAL;  Surgeon: Sharene Butters, MD;  Location: [facility];  Service: Gynecology;  Laterality: N/A;   VAGINAL PROLAPSE REPAIR  06/09/2011   Procedure: VAGINAL VAULT SUSPENSION;  Surgeon: Reece Packer, MD;  Location: [facility];  Service: Urology;  Laterality: N/A;   VEIN LIGATION AND STRIPPING     LEFT LEG    Immunization History  Administered Date(s) Administered   Fluad  Quad(high Dose 65+) 09/10/2018, 11/24/2019   Hepatitis B 05/08/2008   Influenza Split 11/23/2010, 01/12/2012, 12/11/2012, 09/19/2013, 12/22/2014, 09/29/2015, 09/21/2016, 10/05/2017, 09/10/2018, 11/24/2019   Influenza Whole 10/20/2008   Influenza,inj,Quad PF,6+ Mos 12/11/2012, 09/19/2013, 12/22/2014, 09/29/2015, 09/21/2016, 10/05/2017   Influenza-Unspecified 09/10/2015   MMR 09/09/1998   PFIZER(Purple Top)SARS-COV-2 Vaccination 02/18/2019, 03/13/2019, 10/30/2019   Pneumococcal Conjugate-13 10/02/2018   Tdap 07/22/2012    Recent Results (from the past 2160 hour(s))  COMPLETE METABOLIC PANEL WITH GFR     Status: None   Collection Time: 08/03/20 12:00 AM  Result Value Ref Range   Glucose, Bld 91 65 - 99 mg/dL    Comment: .            Fasting reference interval .     BUN 17 7 - 25 mg/dL   Creat 0.80 0.50 - 1.05 mg/dL   eGFR 81 > OR = 60 mL/min/1.52m    Comment: The eGFR is based on the CKD-EPI 2021 equation. To calculate  the new eGFR from a previous Creatinine or Cystatin C result, go to https://www.kidney.org/professionals/ kdoqi/gfr%5Fcalculator    BUN/Creatinine Ratio NOT APPLICABLE 6 - 22 (calc)   Sodium 139 135 - 146 mmol/L   Potassium 4.7 3.5 - 5.3 mmol/L   Chloride 104 98 - 110 mmol/L   CO2 31 20 - 32 mmol/L   Calcium 10.1 8.6 - 10.4 mg/dL   Total Protein 6.9 6.1 - 8.1 g/dL   Albumin 4.0 3.6 - 5.1 g/dL   Globulin 2.9 1.9 - 3.7 g/dL (calc)   AG Ratio 1.4 1.0 - 2.5 (calc)   Total Bilirubin 0.5 0.2 - 1.2 mg/dL   Alkaline phosphatase (APISO) 67 37 - 153 U/L   AST 18 10 - 35 U/L   ALT 19 6 - 29 U/L  Lipid panel     Status: Abnormal   Collection Time: 08/03/20 12:00 AM  Result Value Ref Range   Cholesterol 226 (H) <200 mg/dL   HDL 46 (L) > OR = 50 mg/dL   Triglycerides 164 (H) <150 mg/dL   LDL Cholesterol (Calc) 150 (H) mg/dL (calc)    Comment: Reference range: <100 . Desirable range <100 mg/dL for primary prevention;   <70 mg/dL for patients with CHD or diabetic patients  with > or = 2 CHD risk factors. .Marland KitchenLDL-C is now calculated using the Martin-Hopkins  calculation, which is a validated novel method providing  better accuracy than the Friedewald equation in the  estimation of LDL-C.  MCresenciano Genreet al. JAnnamaria Helling 29150;569(79: 2061-2068  (http://education.[employer].com/faq/FAQ164)    Total CHOL/HDL Ratio 4.9 <5.0 (calc)   Non-HDL Cholesterol (Calc) 180 (H) <130 mg/dL (calc)    Comment: For patients with diabetes plus 1 major ASCVD risk  factor, treating to a non-HDL-C goal of <100 mg/dL  (LDL-C of <70 mg/dL) is considered a therapeutic  option.   POCT URINALYSIS DIP (CLINITEK)     Status: None   Collection Time: 08/20/20 10:42 AM  Result Value Ref Range   Color, UA yellow yellow   Clarity, UA clear clear   Glucose, UA  negative negative mg/dL   Bilirubin, UA negative negative   Ketones, POC UA negative negative mg/dL   Spec Grav, UA 1.015 1.010 - 1.025   Blood, UA negative negative   pH, UA 7.0 5.0 - 8.0   POC PROTEIN,UA negative negative, trace   Urobilinogen, UA 0.2 0.2 or 1.0 E.U./dL   Nitrite, UA Negative Negative   Leukocytes, UA Negative Negative  No results found.     All questions at time of visit were answered - patient instructed to contact office with any additional concerns or updates. ER/RTC precautions were reviewed with the patient as applicable.   Please note: manual typing as well as voice recognition software may have been used to produce this document - typos may escape review. Please contact Dr. Sheppard Coil for any needed clarifications.

## 2020-08-22 LAB — URINE CULTURE
MICRO NUMBER:: 12239124
SPECIMEN QUALITY:: ADEQUATE

## 2020-08-22 LAB — URINALYSIS, ROUTINE W REFLEX MICROSCOPIC
Bilirubin Urine: NEGATIVE
Glucose, UA: NEGATIVE
Hgb urine dipstick: NEGATIVE
Ketones, ur: NEGATIVE
Leukocytes,Ua: NEGATIVE
Nitrite: NEGATIVE
Protein, ur: NEGATIVE
Specific Gravity, Urine: 1.009 (ref 1.001–1.035)
pH: 7 (ref 5.0–8.0)

## 2020-08-23 ENCOUNTER — Telehealth: Payer: Self-pay | Admitting: Osteopathic Medicine

## 2020-08-23 NOTE — Telephone Encounter (Signed)
Pt came in to pick up forms that she dropped off last week and they were not ready for pick up. Patient was really nice and states that as long as she can get them as soon as possible this week and also if someone call her back in today or in the morning with her urine results? Thank you so much

## 2020-08-24 LAB — QUANTIFERON-TB GOLD PLUS
Mitogen-NIL: >10 IU/mL
NIL: 0.04 IU/mL
QuantiFERON-TB Gold Plus: NEGATIVE
TB1-NIL: 0.01 IU/mL
TB2-NIL: 0.04 IU/mL

## 2020-08-24 NOTE — Telephone Encounter (Signed)
Attempted to contact patient at 816 AM, no answer. Unable to leave a vm msg, patient's vm was full.

## 2020-09-14 ENCOUNTER — Ambulatory Visit: Payer: Medicare Other | Admitting: Osteopathic Medicine

## 2020-09-14 ENCOUNTER — Telehealth: Payer: Self-pay

## 2020-09-14 NOTE — Telephone Encounter (Signed)
Former patient called requesting if Dr. Sarajane Jews would take her back as a patient I informed pt that no provider was accepting new patient

## 2020-09-15 ENCOUNTER — Encounter: Payer: Self-pay | Admitting: Osteopathic Medicine

## 2020-09-15 ENCOUNTER — Ambulatory Visit (INDEPENDENT_AMBULATORY_CARE_PROVIDER_SITE_OTHER): Payer: Medicare Other | Admitting: Osteopathic Medicine

## 2020-09-15 ENCOUNTER — Other Ambulatory Visit: Payer: Self-pay

## 2020-09-15 VITALS — BP 122/79 | HR 89 | Temp 97.6°F | Wt 217.0 lb

## 2020-09-15 DIAGNOSIS — I83893 Varicose veins of bilateral lower extremities with other complications: Secondary | ICD-10-CM | POA: Diagnosis not present

## 2020-09-15 DIAGNOSIS — B351 Tinea unguium: Secondary | ICD-10-CM

## 2020-09-15 DIAGNOSIS — I10 Essential (primary) hypertension: Secondary | ICD-10-CM

## 2020-09-15 DIAGNOSIS — F411 Generalized anxiety disorder: Secondary | ICD-10-CM | POA: Diagnosis not present

## 2020-09-15 MED ORDER — TERBINAFINE HCL 1 % EX CREA: TOPICAL_CREAM | CUTANEOUS | 3 refills | Status: DC

## 2020-09-15 MED ORDER — CICLOPIROX 8 % EX SOLN: Freq: Every day | CUTANEOUS | 3 refills | Status: DC

## 2020-09-15 MED ORDER — ALPRAZOLAM 0.25 MG PO TABS: 0.2500 mg | ORAL_TABLET | Freq: Two times a day (BID) | ORAL | 0 refills | Status: DC | PRN

## 2020-09-15 NOTE — Progress Notes (Signed)
Megan Crane is a 70 y.o. female who presents to  [place] at [facility]  today, 09/15/20, seeking care for the following:  Onychomycosis - never started the po terbinafine. Took a dose and felt some stomach upset. Still having concerns w/ onychomycosis and ingrown nails  Requests lowest dose possibel anti-anxiety medication to use as absolutely needed  Concerned about varicose veins and spider veins, using compression stockings regularly Unable to do much lifting at work without pain, requests letter      ASSESSMENT & PLAN with other pertinent findings:  The primary encounter diagnosis was Onychomycosis. Diagnoses of Varicose veins of bilateral lower extremities with other complications, Essential hypertension, and Anxiety state were also pertinent to this visit.    Patient Instructions  Sent Rx for nail treatment to [facility]  See attached GoodRx coupon   Refills should be on file for other Rx   Sent low dose anti-anxiety Rx to use as needed  Let us know if you'd like referral for veins   Letter for lifting   No orders of the defined types were placed in this encounter.   Meds ordered this encounter  Medications   ciclopirox (PENLAC) 8 % solution    Sig: Apply topically at bedtime. Apply over nail and surrounding skin. Apply daily over previous coat. After seven (7) days, may remove with alcohol and continue cycle. If insurance not covering this, please run w/ GoodRx coupon    Dispense:  19.8 mL    Refill:  3   terbinafine (LAMISIL) 1 % cream    Sig: Apply to affected area BID until rash gone, then apply 2 more days.    Dispense:  30 g    Refill:  3   ALPRAZolam (XANAX) 0.25 MG tablet    Sig: Take 1-2 tablets (0.25-0.5 mg total) by mouth 2 (two) times daily as needed for anxiety.    Dispense:  15 tablet    Refill:  0     See below for relevant physical exam findings  See below for recent lab and imaging results  reviewed  Medications, allergies, PMH, PSH, SocH, FamH reviewed below    Follow-up instructions: Return in about 6 months (around 03/15/2021) for ESTABLISH W/ DR MATTHEWS, NURSE VISIT IN 2ND WEEK OCTOBER FOR FLU VAX & PPSV23.                                        Exam:  BP 122/79 (BP Location: Left Arm, Patient Position: Sitting, Cuff Size: Normal)   Pulse 89   Temp 97.6 F (36.4 C) (Oral)   Wt 217 lb (98.4 kg)   BMI 35.02 kg/m  Constitutional: VS see above. General Appearance: alert, well-developed, well-nourished, NAD Neck: No masses, trachea midline.  Respiratory: Normal respiratory effort. no wheeze, no rhonchi, no rales Cardiovascular: S1/S2 normal, no murmur, no rub/gallop auscultated. RRR.  Musculoskeletal: Gait normal. Symmetric and independent movement of all extremities Neurological: Normal balance/coordination. No tremor. Skin: warm, dry, intact.  Psychiatric: Normal judgment/insight. Normal mood and affect. Oriented x3.   Current Meds  Medication Sig   ALPRAZolam (XANAX) 0.25 MG tablet Take 1-2 tablets (0.25-0.5 mg total) by mouth 2 (two) times daily as needed for anxiety.   aspirin 81 MG EC tablet Take 81 mg by mouth daily. Swallow whole.   ciclopirox (PENLAC) 8 % solution Apply topically at  bedtime. Apply over nail and surrounding skin. Apply daily over previous coat. After seven (7) days, may remove with alcohol and continue cycle. If insurance not covering this, please run w/ GoodRx coupon   fluticasone (FLONASE) 50 MCG/ACT nasal spray Place 1 spray into both nostrils daily.   hydrochlorothiazide (HYDRODIURIL) 25 MG tablet Take 1 tablet (25 mg total) by mouth daily.   solifenacin (VESICARE) 5 MG tablet Take 1 tablet (5 mg total) by mouth daily.   terbinafine (LAMISIL) 1 % cream Apply to affected area BID until rash gone, then apply 2 more days.   valsartan (DIOVAN) 40 MG tablet Take 1 tablet (40 mg total) by mouth daily.    [DISCONTINUED] clotrimazole-betamethasone (LOTRISONE) cream Apply to affected area 2 times daily prn rash   [DISCONTINUED] terbinafine (LAMISIL) 250 MG tablet Take 1 tablet (250 mg total) by mouth daily.   Current Facility-Administered Medications for the 09/15/20 encounter (Office Visit) with Emeterio Reeve, DO  Medication   0.9 %  sodium chloride infusion    Allergies  Allergen Reactions   Lidocaine Anaphylaxis, Shortness Of Breath and Other (See Comments)    "Eyes crossed"   Escitalopram     Sleepy   Levofloxacin Itching and Other (See Comments)    disoriented   Levofloxacin Itching and Other (See Comments)    disoriented   Oxycodone-Aspirin Other (See Comments)    "VERY DIZZY"    Patient Active Problem List   Diagnosis Date Noted   Primary osteoarthritis involving multiple joints 11/24/2019   GERD (gastroesophageal reflux disease) 12/26/2016   Edema 05/29/2014   Nasal colonization with methicillin-resistant Staphylococcus aureus 01/22/2014   Varicose veins of bilateral lower extremities with other complications 62/83/1517   Allergic rhinitis 11/29/2009   MOTION SICKNESS 11/29/2009   Hyperlipidemia 08/31/2008   Anxiety state 08/31/2008   Essential hypertension 08/31/2008   PILONIDAL CYST 08/31/2008   HEADACHE 08/31/2008   URINARY INCONTINENCE 08/31/2008   NEPHROLITHIASIS, HX OF 08/31/2008   POSTNASAL DRIP SYNDROME 05/28/2006    Family History  Problem Relation Age of Onset   Cancer Father        kidney, bladder    Coronary artery disease Other    Diabetes Other    Hyperlipidemia Other    Kidney disease Other    Cancer Other        lung   Colon cancer Mother 74    Social History   Tobacco Use  Smoking Status Never  Smokeless Tobacco Never    Past Surgical History:  Procedure Laterality Date   COLONOSCOPY  12/28/2015   per Dr. Havery Moros, serrated polyps and diverticula, repeat in 3 yrs    CYSTOSCOPY  06/09/2011   Procedure: CYSTOSCOPY;  Surgeon:  Reece Packer, MD;  Location: [facility];  Service: Urology;  Laterality: N/A;   CYSTOSCOPY W/ URETERAL STENT PLACEMENT  02/03/2014   Procedure: CYSTOSCOPY WITH  RIGHT RETROGRADE PYELOGRAM/ RIGHT URETERAL STENT PLACEMENT;  Surgeon: Malka So, MD;  Location: [facility];  Service: Urology;;   CYSTOSCOPY WITH BIOPSY  02/03/2014   Procedure: CYSTOSCOPY WITH BIOPSY;  Surgeon: Malka So, MD;  Location: [facility];  Service: Urology;;   PILONIDAL CYST [place]  06/09/2011   Procedure: POSTERIOR REPAIR (RECTOCELE);  Surgeon: Reece Packer, MD;  Location: [facility];  Service: Urology;  Laterality: N/A;  Xenform graft 6x10   RIGHT URETEROSCOPIC STONE EXTRACTION / STENT PLACEMENT  03-13-2000   URETEROSCOPY Right 02/03/2014  Procedure: URETEROSCOPY WITH MANAGEMENT OF URETERAL STRICTURE;  Surgeon: Malka So, MD;  Location: [facility];  Service: Urology;  Laterality: Right;   VAGINAL HYSTERECTOMY  06/09/2011   Procedure: HYSTERECTOMY VAGINAL;  Surgeon: Sharene Butters, MD;  Location: [facility];  Service: Gynecology;  Laterality: N/A;   VAGINAL PROLAPSE REPAIR  06/09/2011   Procedure: VAGINAL VAULT SUSPENSION;  Surgeon: Reece Packer, MD;  Location: [facility];  Service: Urology;  Laterality: N/A;   VEIN LIGATION AND STRIPPING     LEFT LEG    Immunization History  Administered Date(s) Administered   Fluad Quad(high Dose 65+) 09/10/2018, 11/24/2019   Hepatitis B 05/08/2008   Influenza Split 11/23/2010, 01/12/2012, 12/11/2012, 09/19/2013, 12/22/2014, 09/29/2015, 09/21/2016, 10/05/2017, 09/10/2018, 11/24/2019   Influenza Whole 10/20/2008   Influenza,inj,Quad PF,6+ Mos 12/11/2012, 09/19/2013, 12/22/2014, 09/29/2015, 09/21/2016, 10/05/2017   Influenza-Unspecified 09/10/2015   MMR 09/09/1998   PFIZER(Purple Top)SARS-COV-2 Vaccination 02/18/2019, 03/13/2019, 10/30/2019   Pneumococcal Conjugate-13 10/02/2018   Tdap 07/22/2012     Recent Results (from the past 2160 hour(s))  COMPLETE METABOLIC PANEL WITH GFR     Status: None   Collection Time: 08/03/20 12:00 AM  Result Value Ref Range   Glucose, Bld 91 65 - 99 mg/dL    Comment: .            Fasting reference interval .    BUN 17 7 - 25 mg/dL   Creat 0.80 0.50 - 1.05 mg/dL   eGFR 81 > OR = 60 mL/min/1.26m    Comment: The eGFR is based on the CKD-EPI 2021 equation. To calculate  the new eGFR from a previous Creatinine or Cystatin C result, go to https://www.kidney.org/professionals/ kdoqi/gfr%5Fcalculator    BUN/Creatinine Ratio NOT APPLICABLE 6 - 22 (calc)   Sodium 139 135 - 146 mmol/L   Potassium 4.7 3.5 - 5.3 mmol/L   Chloride 104 98 - 110 mmol/L   CO2 31 20 - 32 mmol/L   Calcium 10.1 8.6 - 10.4 mg/dL   Total Protein 6.9 6.1 - 8.1 g/dL   Albumin 4.0 3.6 - 5.1 g/dL   Globulin 2.9 1.9 - 3.7 g/dL (calc)   AG Ratio 1.4 1.0 - 2.5 (calc)   Total Bilirubin 0.5 0.2 - 1.2 mg/dL   Alkaline phosphatase (APISO) 67 37 - 153 U/L   AST 18 10 - 35 U/L   ALT 19 6 - 29 U/L  Lipid panel     Status: Abnormal   Collection Time: 08/03/20 12:00 AM  Result Value Ref Range   Cholesterol 226 (H) <200 mg/dL   HDL 46 (L) > OR = 50 mg/dL   Triglycerides 164 (H) <150 mg/dL   LDL Cholesterol (Calc) 150 (H) mg/dL (calc)    Comment: Reference range: <100 . Desirable range <100 mg/dL for primary prevention;   <70 mg/dL for patients with CHD or diabetic patients  with > or = 2 CHD risk factors. .Marland KitchenLDL-C is now calculated using the Martin-Hopkins  calculation, which is a validated novel method providing  better accuracy than the Friedewald equation in the  estimation of LDL-C.  MCresenciano Genreet al. JAnnamaria Helling 24656;812(75: 2061-2068  (http://education.[facility].com/faq/FAQ164)    Total CHOL/HDL Ratio 4.9 <5.0 (calc)   Non-HDL Cholesterol (Calc) 180 (H) <130 mg/dL (calc)    Comment: For patients with diabetes plus 1 major ASCVD risk  factor, treating to a non-HDL-C goal  of <100 mg/dL  (LDL-C of <70 mg/dL) is considered a therapeutic  option.   QuantiFERON-TB Gold Plus  Status: None   Collection Time: 08/20/20 12:00 AM  Result Value Ref Range   QuantiFERON-TB Gold Plus NEGATIVE NEGATIVE    Comment: Negative test result. M. tuberculosis complex  infection unlikely.    NIL 0.04 IU/mL   Mitogen-NIL >10.00 IU/mL   TB1-NIL 0.01 IU/mL   TB2-NIL 0.04 IU/mL    Comment: . The Nil tube value reflects the background interferon gamma immune response of the patient's blood sample. This value has been subtracted from the patient's displayed TB and Mitogen results. . Lower than expected results with the Mitogen tube prevent false-negative Quantiferon readings by detecting a patient with a potential immune suppressive condition and/or suboptimal pre-analytical specimen handling. . The TB1 Antigen tube is coated with the M. tuberculosis-specific antigens designed to elicit responses from TB antigen primed CD4+ helper T-lymphocytes. . The TB2 Antigen tube is coated with the M. tuberculosis-specific antigens designed to elicit responses from TB antigen primed CD4+ helper and CD8+ cytotoxic T-lymphocytes. . For additional information, please refer to https://education.questdiagnostics.com/faq/FAQ204 (This link is being provided for informational/ educational purposes only.) .   Urinalysis, Routine w reflex microscopic     Status: None   Collection Time: 08/20/20 10:29 AM  Result Value Ref Range   Color, Urine YELLOW YELLOW   APPearance CLEAR CLEAR   Specific Gravity, Urine 1.009 1.001 - 1.035   pH 7.0 5.0 - 8.0   Glucose, UA NEGATIVE NEGATIVE   Bilirubin Urine NEGATIVE NEGATIVE   Ketones, ur NEGATIVE NEGATIVE   Hgb urine dipstick NEGATIVE NEGATIVE   Protein, ur NEGATIVE NEGATIVE   Nitrite NEGATIVE NEGATIVE   Leukocytes,Ua NEGATIVE NEGATIVE  Urine Culture     Status: None   Collection Time: 08/20/20 10:29 AM   Specimen: Urine  Result Value  Ref Range   MICRO NUMBER: [id number]    SPECIMEN QUALITY: Adequate    Sample Source URINE    STATUS: FINAL    Result:      Mixed genital flora isolated. These superficial bacteria are not indicative of a urinary tract infection. No further organism identification is warranted on this specimen. If clinically indicated, recollect clean-catch, mid-stream urine and transfer  immediately to Urine Culture Transport Tube.   POCT URINALYSIS DIP (CLINITEK)     Status: None   Collection Time: 08/20/20 10:42 AM  Result Value Ref Range   Color, UA yellow yellow   Clarity, UA clear clear   Glucose, UA negative negative mg/dL   Bilirubin, UA negative negative   Ketones, POC UA negative negative mg/dL   Spec Grav, UA 1.015 1.010 - 1.025   Blood, UA negative negative   pH, UA 7.0 5.0 - 8.0   POC PROTEIN,UA negative negative, trace   Urobilinogen, UA 0.2 0.2 or 1.0 E.U./dL   Nitrite, UA Negative Negative   Leukocytes, UA Negative Negative    No results found.     All questions at time of visit were answered - patient instructed to contact office with any additional concerns or updates. ER/RTC precautions were reviewed with the patient as applicable.   Please note: manual typing as well as voice recognition software may have been used to produce this document - typos may escape review. Please contact Dr. Sheppard Coil for any needed clarifications.   Total encounter time on date of service, 09/16/20, was 30 minutes spent addressing problems/issues as noted above in [place], including time spent in discussion with patient regarding the HPI, ROS, confirming history, reviewing Assessment & Plan, as well as time spent  on coordination of care, record review.

## 2020-09-15 NOTE — Telephone Encounter (Signed)
Please advise 

## 2020-09-15 NOTE — Patient Instructions (Addendum)
Sent Rx for nail treatment to Megan Crane  See attached GoodRx coupon   Refills should be on file for other Rx   Sent low dose anti-anxiety Rx to use as needed  Let us know if you'd like referral for veins   Letter for lifting

## 2020-09-16 NOTE — Telephone Encounter (Addendum)
Lvm for call back to schedule appointment.Megan Crane

## 2020-09-16 NOTE — Telephone Encounter (Signed)
I just saw her in June. Of course I will see her again

## 2020-09-22 NOTE — Telephone Encounter (Signed)
Patient called back to verify voicemail  an wants to extend her appreciation and will call back at a later time to schedule an appt.

## 2020-10-06 ENCOUNTER — Ambulatory Visit (INDEPENDENT_AMBULATORY_CARE_PROVIDER_SITE_OTHER): Payer: Medicare Other

## 2020-10-06 DIAGNOSIS — Z780 Asymptomatic menopausal state: Secondary | ICD-10-CM | POA: Diagnosis not present

## 2020-10-06 DIAGNOSIS — Z0000 Encounter for general adult medical examination without abnormal findings: Secondary | ICD-10-CM

## 2020-10-06 DIAGNOSIS — Z1211 Encounter for screening for malignant neoplasm of colon: Secondary | ICD-10-CM | POA: Diagnosis not present

## 2020-10-06 NOTE — Progress Notes (Signed)
Subjective:   Megan Crane is a 70 y.o. female who presents for Medicare Annual (Subsequent) preventive examination.   I connected with Megan Crane day by telephone and verified that I am speaking with the correct person using two identifiers. Location patient: home Location provider: work Persons participating in the virtual visit: patient, provider.   I discussed the limitations, risks, security and privacy concerns of performing an evaluation and management service by telephone and the availability of in person appointments. I also discussed with the patient that there may be a patient responsible charge related to this service. The patient expressed understanding and verbally consented to this telephonic visit.    Interactive audio and video telecommunications were attempted between this provider and patient, however failed, due to patient having technical difficulties OR patient did not have access to video capability.  We continued and completed visit with audio only.    Review of Systems    N/A       Objective:    There were no vitals filed for this visit. There is no height or weight on file to calculate BMI.  Advanced Directives 10/06/2019 12/22/2015 02/03/2014 06/09/2011 05/31/2011  Does Patient Have a Medical Advance Directive? Yes No No Patient does not have advance directive Patient does not have advance directive  Type of Advance Directive [place];Living will - - - -  Does patient want to make changes to medical advance directive? No - Patient declined - - - -  Copy of Echo in Chart? No - copy requested - - - -  Would patient like information on creating a medical advance directive? - - Yes - Educational materials given - -  Pre-existing out of facility DNR order (yellow form or pink MOST form) - - - No -    Current Medications (verified) Outpatient Encounter Medications as of 10/06/2020  Medication Sig   ALPRAZolam  (XANAX) 0.25 MG tablet Take 1-2 tablets (0.25-0.5 mg total) by mouth 2 (two) times daily as needed for anxiety.   aspirin 81 MG EC tablet Take 81 mg by mouth daily. Swallow whole.   ciclopirox (PENLAC) 8 % solution Apply topically at bedtime. Apply over nail and surrounding skin. Apply daily over previous coat. After seven (7) days, may remove with alcohol and continue cycle. If insurance not covering this, please run w/ GoodRx coupon   fluticasone (FLONASE) 50 MCG/ACT nasal spray Place 1 spray into both nostrils daily.   hydrochlorothiazide (HYDRODIURIL) 25 MG tablet Take 1 tablet (25 mg total) by mouth daily.   NON FORMULARY  (Patient not taking: No sig reported)   solifenacin (VESICARE) 5 MG tablet Take 1 tablet (5 mg total) by mouth daily.   terbinafine (LAMISIL) 1 % cream Apply to affected area BID until rash gone, then apply 2 more days.   valsartan (DIOVAN) 40 MG tablet Take 1 tablet (40 mg total) by mouth daily.   Facility-Administered Encounter Medications as of 10/06/2020  Medication   0.9 %  sodium chloride infusion    Allergies (verified) Lidocaine, Escitalopram, Levofloxacin, Levofloxacin, and Oxycodone-aspirin   History: Past Medical History:  Diagnosis Date   Anxiety disorder    hx vasovagal responses   Headache(784.0)    History of kidney stones    Hydronephrosis, right    Hyperlipidemia    Hypertension    Nephrolithiasis    Positive nasal culture for methicillin resistant Staphylococcus aureus    Right ureteral stone    Urinary incontinence  sees Dr. Wendy Poet    Past Surgical History:  Procedure Laterality Date   COLONOSCOPY  12/28/2015   per Dr. Havery Moros, serrated polyps and diverticula, repeat in 3 yrs    CYSTOSCOPY  06/09/2011   Procedure: CYSTOSCOPY;  Surgeon: Reece Packer, MD;  Location: [facility];  Service: Urology;  Laterality: N/A;   CYSTOSCOPY W/ URETERAL STENT PLACEMENT  02/03/2014   Procedure: CYSTOSCOPY WITH  RIGHT RETROGRADE PYELOGRAM/ RIGHT  URETERAL STENT PLACEMENT;  Surgeon: Malka So, MD;  Location: [facility];  Service: Urology;;   CYSTOSCOPY WITH BIOPSY  02/03/2014   Procedure: CYSTOSCOPY WITH BIOPSY;  Surgeon: Malka So, MD;  Location[facility];  Service: Urology;;   PILONIDAL CYST [place]  06/09/2011   Procedure: POSTERIOR REPAIR (RECTOCELE);  Surgeon: Reece Packer, MD;  Location: [facility];  Service: Urology;  Laterality: N/A;  Xenform graft 6x10   RIGHT URETEROSCOPIC STONE EXTRACTION / STENT PLACEMENT  03-13-2000   URETEROSCOPY Right 02/03/2014   Procedure: URETEROSCOPY WITH MANAGEMENT OF URETERAL STRICTURE;  Surgeon: Malka So, MD;  Location: [facility];  Service: Urology;  Laterality: Right;   VAGINAL HYSTERECTOMY  06/09/2011   Procedure: HYSTERECTOMY VAGINAL;  Surgeon: Sharene Butters, MD;  Location: [facility];  Service: Gynecology;  Laterality: N/A;   VAGINAL PROLAPSE REPAIR  06/09/2011   Procedure: VAGINAL VAULT SUSPENSION;  Surgeon: Reece Packer, MD;  Location: [facility];  Service: Urology;  Laterality: N/A;   VEIN LIGATION AND STRIPPING     LEFT LEG   Family History  Problem Relation Age of Onset   Cancer Father        kidney, bladder    Coronary artery disease Other    Diabetes Other    Hyperlipidemia Other    Kidney disease Other    Cancer Other        lung   Colon cancer Mother 40   Social History   Socioeconomic History   Marital status: Married    Spouse name: Not on file   Number of children: Not on file   Years of education: Not on file   Highest education level: Not on file  Occupational History   Not on file  Tobacco Use   Smoking status: Never   Smokeless tobacco: Never  Vaping Use   Vaping Use: Never used  Substance and Sexual Activity   Alcohol use: Yes    Alcohol/week: 0.0 standard drinks    Comment: occ   Drug use: No   Sexual activity: Not Currently    Partners: Male  Other Topics Concern    Not on file  Social History Narrative   Not on file   Social Determinants of Health   Financial Resource Strain: Low Risk    Difficulty of Paying Living Expenses: Not hard at all  Food Insecurity: No Food Insecurity   Worried About Charity fundraiser in the Last Year: Never true   [place] in the Last Year: Never true  Transportation Needs: No Transportation Needs   Lack of Transportation (Medical): No   Lack of Transportation (Non-Medical): No  Physical Activity: Insufficiently Active   Days of Exercise per Week: 2 days   Minutes of Exercise per Session: 60 min  Stress: No Stress Concern Present   Feeling of Stress : Not at all  Social Connections: Moderately Integrated   Frequency of Communication with Friends and Family: More  than three times a week   Frequency of Social Gatherings with Friends and Family: Three times a week   Attends Religious Services: Never   Active Member of Clubs or Organizations: Yes   Attends Music therapist: More than 4 times per year   Marital Status: Married    Tobacco Counseling Counseling given: Not Answered   Clinical Intake:                 Diabetic?no         Activities of Daily Living No flowsheet data found.  Patient Care Team: Emeterio Reeve, DO as PCP - General (Osteopathic Medicine)  Indicate any recent Medical Services you may have received from other than [facility] providers in the past year (date may be approximate).     Assessment:   This is a routine wellness examination for Samie.  Hearing/Vision screen No results found.  Dietary issues and exercise activities discussed:     Goals Addressed   None    Depression Screen PHQ 2/9 Scores 09/15/2020 08/02/2020 04/02/2020 10/06/2019  PHQ - 2 Score _0 0  PHQ- 9 Score - 4 6 0    Fall Risk Fall Risk  09/15/2020 08/02/2020 10/06/2019  Falls in the past year? 1 0 0  Number falls in past yr: 1 0 0  Injury with Fall? 0 0 0  Risk for  fall due to : Impaired balance/gait No Fall Risks Medication side effect  Follow up Falls evaluation completed;Falls prevention discussed Falls evaluation completed Falls evaluation completed;Falls prevention discussed    FALL RISK PREVENTION PERTAINING TO THE HOME:  Any stairs in or around the home? No  If so, are there any without handrails? No  Home free of loose throw rugs in walkways, pet beds, electrical cords, etc? Yes  Adequate lighting in your home to reduce risk of falls? Yes   ASSISTIVE DEVICES UTILIZED TO PREVENT FALLS:  Life alert? No  Use of a cane, walker or w/c? No  Grab bars in the bathroom? No  Shower chair or bench in shower? No  Elevated toilet seat or a handicapped toilet? No    Cognitive Function:    Normal cognitive status assessed by direct observation by this Nurse Health Advisor. No abnormalities found.      Immunizations Immunization History  Administered Date(s) Administered   Fluad Quad(high Dose 65+) 09/10/2018, 11/24/2019   Hepatitis B 05/08/2008   Influenza Split 11/23/2010, 01/12/2012, 12/11/2012, 09/19/2013, 12/22/2014, 09/29/2015, 09/21/2016, 10/05/2017, 09/10/2018, 11/24/2019   Influenza Whole 10/20/2008   Influenza,inj,Quad PF,6+ Mos 12/11/2012, 09/19/2013, 12/22/2014, 09/29/2015, 09/21/2016, 10/05/2017   Influenza-Unspecified 09/10/2015   MMR 09/09/1998   PFIZER(Purple Top)SARS-COV-2 Vaccination 02/18/2019, 03/13/2019, 10/30/2019   Pneumococcal Conjugate-13 10/02/2018   Tdap 07/22/2012    TDAP status: Up to date  Flu Vaccine status: Up to date  Pneumococcal vaccine status: Up to date  Covid-19 vaccine status: Completed vaccines  Qualifies for Shingles Vaccine? Yes   Zostavax completed No   Shingrix Completed?: No.    Education has been provided regarding the importance of this vaccine. Patient has been advised to call insurance company to determine out of pocket expense if they have not yet received this vaccine. Advised may also  receive vaccine at local pharmacy or Health Dept. Verbalized acceptance and understanding.  Screening Tests Health Maintenance  Topic Date Due   Hepatitis C Screening  Never done   DEXA SCAN  10/14/2017   COLONOSCOPY (Pts 45-70yr Insurance coverage will need to be  confirmed)  12/28/2018   COVID-19 Vaccine (4 - Booster for Pfizer series) 03/01/2020   Zoster Vaccines- Shingrix (1 of 2) 12/15/2020 (Originally 10/15/2002)   INFLUENZA VACCINE  04/08/2021 (Originally 08/09/2020)   MAMMOGRAM  12/14/2021   TETANUS/TDAP  07/23/2022   HPV VACCINES  Aged Out    Health Maintenance  Health Maintenance Due  Topic Date Due   Hepatitis C Screening  Never done   DEXA SCAN  10/14/2017   COLONOSCOPY (Pts 45-19yr Insurance coverage will need to be confirmed)  12/28/2018   COVID-19 Vaccine (4 - Booster for [place]) 03/01/2020    Colorectal cancer screening: Referral to GI placed 10/06/2020. Pt aware the office will call re: appt.  Mammogram status: Completed 12/18/2019. Repeat every year/  Bone Density status: Ordered 10/03/2020. Pt provided with contact info and advised to call to schedule appt.  Lung Cancer Screening: (Low Dose CT Chest recommended if Age 68-80years, 30 pack-year currently smoking OR have quit w/in 15years.) does not qualify.   Lung Cancer Screening Referral: n/a  Additional Screening:  Hepatitis C Screening: does qualify;   Vision Screening: Recommended annual ophthalmology exams for early detection of glaucoma and other disorders of the eye. Is the patient up to date with their annual eye exam?  Yes  Who is the provider or what is the name of the office in which the patient attends annual eye exams? Dr.Gould  If pt is not established with a provider, would they like to be referred to a provider to establish care? No .   Dental Screening: Recommended annual dental exams for proper oral hygiene  Community Resource Referral / Chronic Care Management: CRR required  this visit?  No   CCM required this visit?  No      Plan:     I have personally reviewed and noted the following in the patient's chart:   Medical and social history Use of alcohol, tobacco or illicit drugs  Current medications and supplements including opioid prescriptions.  Functional ability and status Nutritional status Physical activity Advanced directives List of other physicians Hospitalizations, surgeries, and ER visits in previous 12 months Vitals Screenings to include cognitive, depression, and falls Referrals and appointments  In addition, I have reviewed and discussed with patient certain preventive protocols, quality metrics, and best practice recommendations. A written personalized care plan for preventive services as well as general preventive health recommendations were provided to patient.     LRandel Pigg LPN   [id number]  Nurse Notes: none

## 2020-10-06 NOTE — Patient Instructions (Signed)
Megan Crane , Thank you for taking time to come for your Medicare Wellness Visit. I appreciate your ongoing commitment to your health goals. Please review the following plan we discussed and let me know if I can assist you in the future.   Screening recommendations/referrals: Colonoscopy: order placed 10/06/2020 Mammogram: 12/18/2019 Bone Density: ordered 10/06/2020 Recommended yearly ophthalmology/optometry visit for glaucoma screening and checkup Recommended yearly dental visit for hygiene and checkup  Vaccinations: Influenza vaccine: due in fall 2022  Pneumococcal vaccine: due 2nd dose  Tdap vaccine: 07/22/2012 Shingles vaccine: will consider     Advanced directives: none   Conditions/risks identified: none   Next appointment: none    Preventive Care 68 Years and Older, Female Preventive care refers to lifestyle choices and visits with your health care provider that can promote health and wellness. What does preventive care include? A yearly physical exam. This is also called an annual well check. Dental exams once or twice a year. Routine eye exams. Ask your health care provider how often you should have your eyes checked. Personal lifestyle choices, including: Daily care of your teeth and gums. Regular physical activity. Eating a healthy diet. Avoiding tobacco and drug use. Limiting alcohol use. Practicing safe sex. Taking low-dose aspirin every day. Taking vitamin and mineral supplements as recommended by your health care provider. What happens during an annual well check? The services and screenings done by your health care provider during your annual well check will depend on your age, overall health, lifestyle risk factors, and family history of disease. Counseling  Your health care provider may ask you questions about your: Alcohol use. Tobacco use. Drug use. Emotional well-being. Home and relationship well-being. Sexual activity. Eating habits. History of  falls. Memory and ability to understand (cognition). Work and work Statistician. Reproductive health. Screening  You may have the following tests or measurements: Height, weight, and BMI. Blood pressure. Lipid and cholesterol levels. These may be checked every 5 years, or more frequently if you are over 75 years old. Skin check. Lung cancer screening. You may have this screening every year starting at age 68 if you have a 30-pack-year history of smoking and currently smoke or have quit within the past 15 years. Fecal occult blood test (FOBT) of the stool. You may have this test every year starting at age 68. Flexible sigmoidoscopy or colonoscopy. You may have a sigmoidoscopy every 5 years or a colonoscopy every 10 years starting at age 67. Hepatitis C blood test. Hepatitis B blood test. Sexually transmitted disease (STD) testing. Diabetes screening. This is done by checking your blood sugar (glucose) after you have not eaten for a while (fasting). You may have this done every 1-3 years. Bone density scan. This is done to screen for osteoporosis. You may have this done starting at age 68. Mammogram. This may be done every 1-2 years. Talk to your health care provider about how often you should have regular mammograms. Talk with your health care provider about your test results, treatment options, and if necessary, the need for more tests. Vaccines  Your health care provider may recommend certain vaccines, such as: Influenza vaccine. This is recommended every year. Tetanus, diphtheria, and acellular pertussis (Tdap, Td) vaccine. You may need a Td booster every 10 years. Zoster vaccine. You may need this after age 68. Pneumococcal 13-valent conjugate (PCV13) vaccine. One dose is recommended after age 68. Pneumococcal polysaccharide (PPSV23) vaccine. One dose is recommended after age 68. Talk to your health care provider about which  screenings and vaccines you need and how often you need  them. This information is not intended to replace advice given to you by your health care provider. Make sure you discuss any questions you have with your health care provider. Document Released: 01/22/2015 Document Revised: 09/15/2015 Document Reviewed: 10/27/2014 Elsevier Interactive Patient Education  2017 [place] Prevention in the Home Falls can cause injuries. They can happen to people of all ages. There are many things you can do to make your home safe and to help prevent falls. What can I do on the outside of my home? Regularly fix the edges of walkways and driveways and fix any cracks. Remove anything that might make you trip as you walk through a door, such as a raised step or threshold. Trim any bushes or trees on the path to your home. Use bright outdoor lighting. Clear any walking paths of anything that might make someone trip, such as rocks or tools. Regularly check to see if handrails are loose or broken. Make sure that both sides of any steps have handrails. Any raised decks and porches should have guardrails on the edges. Have any leaves, snow, or ice cleared regularly. Use sand or salt on walking paths during winter. Clean up any spills in your garage right away. This includes oil or grease spills. What can I do in the bathroom? Use night lights. Install grab bars by the toilet and in the tub and shower. Do not use towel bars as grab bars. Use non-skid mats or decals in the tub or shower. If you need to sit down in the shower, use a plastic, non-slip stool. Keep the floor dry. Clean up any water that spills on the floor as soon as it happens. Remove soap buildup in the tub or shower regularly. Attach bath mats securely with double-sided non-slip rug tape. Do not have throw rugs and other things on the floor that can make you trip. What can I do in the bedroom? Use night lights. Make sure that you have a light by your bed that is easy to reach. Do not use  any sheets or blankets that are too big for your bed. They should not hang down onto the floor. Have a firm chair that has side arms. You can use this for support while you get dressed. Do not have throw rugs and other things on the floor that can make you trip. What can I do in the kitchen? Clean up any spills right away. Avoid walking on wet floors. Keep items that you use a lot in easy-to-reach places. If you need to reach something above you, use a strong step stool that has a grab bar. Keep electrical cords out of the way. Do not use floor polish or wax that makes floors slippery. If you must use wax, use non-skid floor wax. Do not have throw rugs and other things on the floor that can make you trip. What can I do with my stairs? Do not leave any items on the stairs. Make sure that there are handrails on both sides of the stairs and use them. Fix handrails that are broken or loose. Make sure that handrails are as long as the stairways. Check any carpeting to make sure that it is firmly attached to the stairs. Fix any carpet that is loose or worn. Avoid having throw rugs at the top or bottom of the stairs. If you do have throw rugs, attach them to the floor with carpet  tape. Make sure that you have a light switch at the top of the stairs and the bottom of the stairs. If you do not have them, ask someone to add them for you. What else can I do to help prevent falls? Wear shoes that: Do not have high heels. Have rubber bottoms. Are comfortable and fit you well. Are closed at the toe. Do not wear sandals. If you use a stepladder: Make sure that it is fully opened. Do not climb a closed stepladder. Make sure that both sides of the stepladder are locked into place. Ask someone to hold it for you, if possible. Clearly mark and make sure that you can see: Any grab bars or handrails. First and last steps. Where the edge of each step is. Use tools that help you move around (mobility aids)  if they are needed. These include: Canes. Walkers. Scooters. Crutches. Turn on the lights when you go into a dark area. Replace any light bulbs as soon as they burn out. Set up your furniture so you have a clear path. Avoid moving your furniture around. If any of your floors are uneven, fix them. If there are any pets around you, be aware of where they are. Review your medicines with your doctor. Some medicines can make you feel dizzy. This can increase your chance of falling. Ask your doctor what other things that you can do to help prevent falls. This information is not intended to replace advice given to you by your health care provider. Make sure you discuss any questions you have with your health care provider. Document Released: 10/22/2008 Document Revised: 06/03/2015 Document Reviewed: 01/30/2014 Elsevier Interactive Patient Education  2017 [employer].

## 2020-10-07 ENCOUNTER — Other Ambulatory Visit: Payer: Self-pay

## 2020-10-07 ENCOUNTER — Ambulatory Visit (INDEPENDENT_AMBULATORY_CARE_PROVIDER_SITE_OTHER): Payer: Medicare Other | Admitting: Family Medicine

## 2020-10-07 ENCOUNTER — Encounter: Payer: Self-pay | Admitting: Family Medicine

## 2020-10-07 VITALS — BP 91/53 | HR 87 | Temp 102.6°F | Ht 66.0 in | Wt 217.0 lb

## 2020-10-07 DIAGNOSIS — R059 Cough, unspecified: Secondary | ICD-10-CM

## 2020-10-07 LAB — POCT RAPID STREP A (OFFICE): Rapid Strep A Screen: NEGATIVE

## 2020-10-07 LAB — POCT INFLUENZA A/B
Influenza A, POC: NEGATIVE
Influenza B, POC: NEGATIVE

## 2020-10-07 NOTE — Progress Notes (Signed)
Established Patient Office Visit  Subjective:  Patient ID: Megan Crane, female    DOB: Apr 21, 1950  Age: 70 y.o. MRN: [id number]  CC:  Chief Complaint  Patient presents with   Sinusitis    HPI Megan Crane presents for sick started on Monday.  Started with some upper bilateral back pain.  She says the next day she started to get a lot of frontal headache and pressure and started using her Flonase.  And then yesterday felt like her ears were starting to itch.  Not having any ear pain but she also started developing a cough yesterday that is occasionally productive of sputum but mostly dry.  She did do a home COVID test yesterday.  She did not take any medications today so that she could see if she was running a fever.  She is worried about pneumonia. Using Emergen-C, and Tylenol    Past Medical History:  Diagnosis Date   Anxiety disorder    hx vasovagal responses   Headache(784.0)    History of kidney stones    Hydronephrosis, right    Hyperlipidemia    Hypertension    Nephrolithiasis    Positive nasal culture for methicillin resistant Staphylococcus aureus    Right ureteral stone    Urinary incontinence    sees Dr. Wendy Poet     Past Surgical History:  Procedure Laterality Date   COLONOSCOPY  12/28/2015   per Dr. Havery Moros, serrated polyps and diverticula, repeat in 3 yrs    CYSTOSCOPY  06/09/2011   Procedure: CYSTOSCOPY;  Surgeon: Reece Packer, MD;  Location: [facility];  Service: Urology;  Laterality: N/A;   CYSTOSCOPY W/ URETERAL STENT PLACEMENT  02/03/2014   Procedure: CYSTOSCOPY WITH  RIGHT RETROGRADE PYELOGRAM/ RIGHT URETERAL STENT PLACEMENT;  Surgeon: Malka So, MD;  Location: [facility];  Service: Urology;;   CYSTOSCOPY WITH BIOPSY  02/03/2014   Procedure: CYSTOSCOPY WITH BIOPSY;  Surgeon: Malka So, MD;  Location: [facility];  Service: Urology;;   PILONIDAL CYST [place]  06/09/2011   Procedure:  POSTERIOR REPAIR (RECTOCELE);  Surgeon: Reece Packer, MD;  Location: [facility];  Service: Urology;  Laterality: N/A;  Xenform graft 6x10   RIGHT URETEROSCOPIC STONE EXTRACTION / STENT PLACEMENT  03-13-2000   URETEROSCOPY Right 02/03/2014   Procedure: URETEROSCOPY WITH MANAGEMENT OF URETERAL STRICTURE;  Surgeon: Malka So, MD;  Location: [facility];  Service: Urology;  Laterality: Right;   VAGINAL HYSTERECTOMY  06/09/2011   Procedure: HYSTERECTOMY VAGINAL;  Surgeon: Sharene Butters, MD;  Location: [facility];  Service: Gynecology;  Laterality: N/A;   VAGINAL PROLAPSE REPAIR  06/09/2011   Procedure: VAGINAL VAULT SUSPENSION;  Surgeon: Reece Packer, MD;  Location: [facility];  Service: Urology;  Laterality: N/A;   VEIN LIGATION AND STRIPPING     LEFT LEG    Family History  Problem Relation Age of Onset   Cancer Father        kidney, bladder    Coronary artery disease Other    Diabetes Other    Hyperlipidemia Other    Kidney disease Other    Cancer Other        lung   Colon cancer Mother 43    Social History   Socioeconomic History   Marital status: Married    Spouse name: Not on file   Number of children: Not on file   Years of education: Not  on file   Highest education level: Not on file  Occupational History   Not on file  Tobacco Use   Smoking status: Never   Smokeless tobacco: Never  Vaping Use   Vaping Use: Never used  Substance and Sexual Activity   Alcohol use: Yes    Alcohol/week: 0.0 standard drinks    Comment: occ   Drug use: No   Sexual activity: Not Currently    Partners: Male  Other Topics Concern   Not on file  Social History Narrative   Not on file   Social Determinants of Health   Financial Resource Strain: Low Risk    Difficulty of Paying Living Expenses: Not hard at all  Food Insecurity: No Food Insecurity   Worried About Charity fundraiser in the Last Year: Never true   [place] in the Last Year: Never true   Transportation Needs: No Transportation Needs   Lack of Transportation (Medical): No   Lack of Transportation (Non-Medical): No  Physical Activity: Inactive   Days of Exercise per Week: 0 days   Minutes of Exercise per Session: 0 min  Stress: No Stress Concern Present   Feeling of Stress : Not at all  Social Connections: Socially Integrated   Frequency of Communication with Friends and Family: Three times a week   Frequency of Social Gatherings with Friends and Family: Three times a week   Attends Religious Services: 1 to 4 times per year   Active Member of Clubs or Organizations: Yes   Attends Archivist Meetings: 1 to 4 times per year   Marital Status: Married  Human resources officer Violence: Not At Risk   Fear of Current or Ex-Partner: No   Emotionally Abused: No   Physically Abused: No   Sexually Abused: No    Outpatient Medications Prior to Visit  Medication Sig Dispense Refill   ALPRAZolam (XANAX) 0.25 MG tablet Take 1-2 tablets (0.25-0.5 mg total) by mouth 2 (two) times daily as needed for anxiety. 15 tablet 0   aspirin 81 MG EC tablet Take 81 mg by mouth daily. Swallow whole.     ciclopirox (PENLAC) 8 % solution Apply topically at bedtime. Apply over nail and surrounding skin. Apply daily over previous coat. After seven (7) days, may remove with alcohol and continue cycle. If insurance not covering this, please run w/ GoodRx coupon 19.8 mL 3   fluticasone (FLONASE) 50 MCG/ACT nasal spray Place 1 spray into both nostrils daily. 16 g 11   hydrochlorothiazide (HYDRODIURIL) 25 MG tablet Take 1 tablet (25 mg total) by mouth daily. 90 tablet 3   NON FORMULARY      solifenacin (VESICARE) 5 MG tablet Take 1 tablet (5 mg total) by mouth daily. 90 tablet 3   terbinafine (LAMISIL) 1 % cream Apply to affected area BID until rash gone, then apply 2 more days. 30 g 3   valsartan (DIOVAN) 40 MG tablet Take 1 tablet (40 mg total) by mouth daily. 90 tablet 3   Facility-Administered  Medications Prior to Visit  Medication Dose Route Frequency Provider Last Rate Last Admin   0.9 %  sodium chloride infusion  500 mL Intravenous Continuous Armbruster, Carlota Raspberry, MD        Allergies  Allergen Reactions   Lidocaine Anaphylaxis, Shortness Of Breath and Other (See Comments)    "Eyes crossed"   Escitalopram     Sleepy   Levofloxacin Itching and Other (See Comments)    disoriented  Levofloxacin Itching and Other (See Comments)    disoriented   Oxycodone-Aspirin Other (See Comments)    "VERY DIZZY"    ROS Review of Systems    Objective:    Physical Exam Constitutional:      Appearance: Normal appearance. She is well-developed.  HENT:     Head: Normocephalic and atraumatic.     Right Ear: Tympanic membrane, ear canal and external ear normal.     Left Ear: Tympanic membrane, ear canal and external ear normal.     Nose: Nose normal.     Mouth/Throat:     Mouth: Mucous membranes are moist.     Pharynx: Oropharynx is clear. No oropharyngeal exudate or posterior oropharyngeal erythema.  Eyes:     Conjunctiva/sclera: Conjunctivae normal.     Pupils: Pupils are equal, round, and reactive to light.  Neck:     Thyroid: No thyromegaly.  Cardiovascular:     Rate and Rhythm: Normal rate and regular rhythm.     Heart sounds: Normal heart sounds.  Pulmonary:     Effort: Pulmonary effort is normal.     Breath sounds: Normal breath sounds. No wheezing.  Musculoskeletal:     Cervical back: Neck supple.  Lymphadenopathy:     Cervical: No cervical adenopathy.  Skin:    General: Skin is warm and dry.  Neurological:     Mental Status: She is alert and oriented to person, place, and time.  Psychiatric:        Behavior: Behavior normal.    BP (!) 91/53   Pulse 87   Temp (!) 102.6 F (39.2 C) (Oral)   Ht '5\' 6"'  (1.676 m)   Wt 217 lb (98.4 kg)   SpO2 94%   BMI 35.02 kg/m  Wt Readings from Last 3 Encounters:  10/07/20 217 lb (98.4 kg)  09/15/20 217 lb (98.4 kg)   08/20/20 218 lb 1.9 oz (98.9 kg)     Health Maintenance Due  Topic Date Due   Hepatitis C Screening  Never done   DEXA SCAN  10/14/2017   COLONOSCOPY (Pts 45-65yr Insurance coverage will need to be confirmed)  12/28/2018    There are no preventive care reminders to display for this patient.  Lab Results  Component Value Date   TSH 1.43 04/02/2020   Lab Results  Component Value Date   WBC 5.9 04/02/2020   HGB 14.1 04/02/2020   HCT 41.9 04/02/2020   MCV 89.9 04/02/2020   PLT 195.0 04/02/2020   Lab Results  Component Value Date   NA 139 08/03/2020   K 4.7 08/03/2020   CO2 31 08/03/2020   GLUCOSE 91 08/03/2020   BUN 17 08/03/2020   CREATININE 0.80 08/03/2020   BILITOT 0.5 08/03/2020   ALKPHOS 69 04/02/2020   AST 18 08/03/2020   ALT 19 08/03/2020   PROT 6.9 08/03/2020   ALBUMIN 4.6 04/02/2020   CALCIUM 10.1 08/03/2020   EGFR 81 08/03/2020   GFR 71.88 04/02/2020   Lab Results  Component Value Date   CHOL 226 (H) 08/03/2020   Lab Results  Component Value Date   HDL 46 (L) 08/03/2020   Lab Results  Component Value Date   LDLCALC 150 (H) 08/03/2020   Lab Results  Component Value Date   TRIG 164 (H) 08/03/2020   Lab Results  Component Value Date   CHOLHDL 4.9 08/03/2020   Lab Results  Component Value Date   HGBA1C 5.8 12/21/2017      Assessment &  Plan:   Problem List Items Addressed This Visit   None Visit Diagnoses     Cough    -  Primary   Relevant Orders   Novel Coronavirus, NAA ([facility])   POCT rapid strep A (Completed)   POCT Influenza A/B (Completed)       We will test for flu as we have seen a few cases in the last couple of weeks here locally.  We will also chest check for COVID-19 even though her home test was negative yesterday we did discuss that several people are testing negative the first couple days of illness and then it becomes positive the COVID will be a send out PCR.  Will call with results once available in the meantime  okay to use Tylenol and/or ibuprofen alternating as needed for fever and pain relief.  Make sure you are staying well-hydrated.  Flu swab was negative.  COVID test pending.  Commend symptomatic care.  If not better after the weekend then please let me know.  But I do suspect that this is probably viral.  No orders of the defined types were placed in this encounter.   Follow-up: No follow-ups on file.    Beatrice Lecher, MD

## 2020-10-07 NOTE — Patient Instructions (Signed)
Okay to take an over-the-counter decongestant such as Sudafed.  This can help with the pressure. I would also recommend an antihistamine for the itching in your ears so this could be Claritin or Allegra. You can use Delsym for cough. If you are not feeling better after the weekend then please let us know. Should hopefully have your COVID test back sometime tomorrow and we can let you know.

## 2020-10-08 ENCOUNTER — Ambulatory Visit: Payer: Medicare Other

## 2020-10-08 LAB — SARS-COV-2, NAA 2 DAY TAT

## 2020-10-08 LAB — NOVEL CORONAVIRUS, NAA: SARS-CoV-2, NAA: DETECTED — AB

## 2020-10-08 NOTE — Progress Notes (Signed)
These call patient and let her know that she is positive for COVID.  So she will need to quarantine for a total of 5 days with the first day of symptoms being day 0.  And then if she is feeling better then she will need to mask out in public for the additional 5 days.

## 2020-10-11 ENCOUNTER — Telehealth: Payer: Self-pay

## 2020-10-11 NOTE — Telephone Encounter (Signed)
Pt tested positive for COVID last Thursday. Her husband got sick and was seen at [facility] and was given a Rx for the antiviral. She is wanting to know if she can get one. She states she is feeling better, but is still not 100%. She said that she can get up and do things like the dishes but will suddenly get fatigued and weak. She is wanting to know if it would be beneficial for her to get a Rx for the antiviral even though she is 5 days out. She still has a cough but it is improving a little. PND, clear mucus.

## 2020-10-12 ENCOUNTER — Other Ambulatory Visit: Payer: Self-pay

## 2020-10-12 ENCOUNTER — Encounter: Payer: Self-pay | Admitting: Physician Assistant

## 2020-10-12 ENCOUNTER — Ambulatory Visit (INDEPENDENT_AMBULATORY_CARE_PROVIDER_SITE_OTHER): Payer: Medicare Other | Admitting: Physician Assistant

## 2020-10-12 VITALS — BP 126/73 | HR 65 | Temp 98.4°F | Ht 66.0 in | Wt 216.0 lb

## 2020-10-12 DIAGNOSIS — R051 Acute cough: Secondary | ICD-10-CM

## 2020-10-12 DIAGNOSIS — J329 Chronic sinusitis, unspecified: Secondary | ICD-10-CM

## 2020-10-12 DIAGNOSIS — J40 Bronchitis, not specified as acute or chronic: Secondary | ICD-10-CM

## 2020-10-12 DIAGNOSIS — U071 COVID-19: Secondary | ICD-10-CM | POA: Diagnosis not present

## 2020-10-12 MED ORDER — DEXAMETHASONE 4 MG PO TABS: 4.0000 mg | ORAL_TABLET | Freq: Two times a day (BID) | ORAL | 0 refills | Status: DC

## 2020-10-12 MED ORDER — AZITHROMYCIN 250 MG PO TABS: ORAL_TABLET | ORAL | 0 refills | Status: DC

## 2020-10-12 MED ORDER — BENZONATATE 200 MG PO CAPS: 200.0000 mg | ORAL_CAPSULE | Freq: Two times a day (BID) | ORAL | 0 refills | Status: DC | PRN

## 2020-10-12 MED ORDER — FLUTICASONE PROPIONATE 50 MCG/ACT NA SUSP: 2.0000 | Freq: Every day | NASAL | 2 refills | Status: DC

## 2020-10-12 NOTE — Progress Notes (Signed)
Subjective:    Patient ID: Megan Crane, female    DOB: [birth year], 70 y.o.   MRN: [id number]  HPI Patient is a 70 year old obese female with hypertension, hyperlipidemia who presents to the clinic on day 7 of COVID.  She was seen in the clinic by Dr. Madilyn Fireman on 9/29 where she was given symptomatic care for her viral symptoms.  She was called back with positive COVID test but not sent an antiviral.  Patient feels like she continues to worsen.  She has a productive cough.  She continues to feel really weak.  She has some central chest tightness.  She is taking over-the-counter Tylenol Cold sinus severe. Her husband had antiviral and feels much better. She wonders if she can get. She is frustrated she is not better.  No calf pain or tenderness, fever, chills, body aches.  .. Active Ambulatory Problems    Diagnosis Date Noted   Hyperlipidemia 08/31/2008   Anxiety state 08/31/2008   Essential hypertension 08/31/2008   POSTNASAL DRIP SYNDROME 05/28/2006   Allergic rhinitis 11/29/2009   PILONIDAL CYST 08/31/2008   HEADACHE 08/31/2008   URINARY INCONTINENCE 08/31/2008   MOTION SICKNESS 11/29/2009   NEPHROLITHIASIS, HX OF 08/31/2008   Varicose veins of bilateral lower extremities with other complications 60/10/9321   Nasal colonization with methicillin-resistant Staphylococcus aureus 01/22/2014   Edema 05/29/2014   GERD (gastroesophageal reflux disease) 12/26/2016   Primary osteoarthritis involving multiple joints 11/24/2019   Resolved Ambulatory Problems    Diagnosis Date Noted   No Resolved Ambulatory Problems   Past Medical History:  Diagnosis Date   Anxiety disorder    History of kidney stones    Hydronephrosis, right    Hypertension    Nephrolithiasis    Positive nasal culture for methicillin resistant Staphylococcus aureus    Right ureteral stone    Urinary incontinence      Review of Systems See HPI.     Objective:   Physical Exam Vitals reviewed.   Constitutional:      Appearance: Normal appearance. She is obese.  HENT:     Head: Normocephalic.     Right Ear: Tympanic membrane, ear canal and external ear normal. There is no impacted cerumen.     Left Ear: Tympanic membrane, ear canal and external ear normal. There is no impacted cerumen.     Nose: Congestion present.     Mouth/Throat:     Mouth: Mucous membranes are moist.     Pharynx: Posterior oropharyngeal erythema present. No oropharyngeal exudate.  Eyes:     Extraocular Movements: Extraocular movements intact.     Conjunctiva/sclera: Conjunctivae normal.     Pupils: Pupils are equal, round, and reactive to light.  Neck:     Vascular: No carotid bruit.  Cardiovascular:     Rate and Rhythm: Normal rate and regular rhythm.     Pulses: Normal pulses.     Heart sounds: Normal heart sounds.  Pulmonary:     Effort: Pulmonary effort is normal.     Breath sounds: Normal breath sounds.  Abdominal:     General: Bowel sounds are normal. There is no distension.     Palpations: Abdomen is soft. There is no mass.     Tenderness: There is no abdominal tenderness. There is no right CVA tenderness, left CVA tenderness, guarding or rebound.  Musculoskeletal:        General: No swelling or tenderness.     Cervical back: Normal range of motion and neck supple.  No rigidity or tenderness.     Right lower leg: No edema.     Left lower leg: No edema.     Comments: No calf tenderness, swelling, redness.   Lymphadenopathy:     Cervical: No cervical adenopathy.  Neurological:     General: No focal deficit present.     Mental Status: She is alert.  Psychiatric:        Mood and Affect: Mood normal.          Assessment & Plan:  Marland KitchenMarland KitchenYounique was seen today for covid positive.  Diagnoses and all orders for this visit:  COVID-19 virus infection -     azithromycin (ZITHROMAX Z-PAK) 250 MG tablet; Take 2 tablets (500 mg) on  Day 1,  followed by 1 tablet (250 mg) once daily on Days 2 through  5. -     dexamethasone (DECADRON) 4 MG tablet; Take 1 tablet (4 mg total) by mouth 2 (two) times daily with a meal.  Sinobronchitis -     azithromycin (ZITHROMAX Z-PAK) 250 MG tablet; Take 2 tablets (500 mg) on  Day 1,  followed by 1 tablet (250 mg) once daily on Days 2 through 5. -     dexamethasone (DECADRON) 4 MG tablet; Take 1 tablet (4 mg total) by mouth 2 (two) times daily with a meal. -     fluticasone (FLONASE) 50 MCG/ACT nasal spray; Place 2 sprays into both nostrils daily. -     benzonatate (TESSALON) 200 MG capsule; Take 1 capsule (200 mg total) by mouth 2 (two) times daily as needed for cough.  Day 7 of symptoms and worried about not feeling better.  Explained she is too far outside of the window to start antiviral.  Treating for sinobronchitis secondary to covid with zpak/dexamethasone/flonase/tessalon.  Reassured that vital signs look so good.  Continue to quarantine for full 10 days.  Rest and hydrate.  Marland Kitchen.Vitamin D3 5000 IU (125 mcg) daily Vitamin C 500 mg twice daily Zinc 50 to 75 mg daily Recommended to start vitamins.  Follow up if not improving or worsening. Discussed can take weeks to feel 100 percent.

## 2020-10-12 NOTE — Patient Instructions (Signed)
..Vitamin D3 5000 IU (125 mcg) daily Vitamin C 500 mg twice daily Zinc 50 to 75 mg daily  Acute Bronchitis, Adult Acute bronchitis is when air tubes in the lungs (bronchi) suddenly get swollen. The condition can make it hard for you to breathe. In adults, acute bronchitis usually goes away within 2 weeks. A cough caused by bronchitis may last up to 3 weeks. Smoking, allergies, and asthma can make the condition worse. What are the causes? This condition is caused by: Cold and flu viruses. The most common cause of this condition is the virus that causes the common cold. Bacteria. Substances that irritate the lungs, including: Smoke from cigarettes and other types of tobacco. Dust and pollen. Fumes from chemicals, gases, or burned fuel. Other materials that pollute indoor or outdoor air. Close contact with someone who has acute bronchitis. What increases the risk? The following factors may make you more likely to develop this condition: A weak body's defense system. This is also called the immune system. Any condition that affects your lungs and breathing, such as asthma. What are the signs or symptoms? Symptoms of this condition include: A cough. Coughing up clear, yellow, or green mucus. Wheezing. Having too much mucus in your lungs (chest congestion). Shortness of breath. A fever. Chills. Body aches. A sore throat. How is this treated? Acute bronchitis may go away over time without treatment. Your doctor may recommend: Drinking more fluids. Using a device that gets medicine into your lungs (inhaler). Using a vaporizer or a humidifier. These are machines that add water or moisture to the air. This helps with coughing and poor breathing. Taking a medicine for fever. Taking a medicine that thins mucus and clears congestion. Taking a medicine that prevents or stops coughing. Follow these instructions at home: Activity Get a lot of rest. Return to your normal activities as told  by your doctor. Ask your doctor what activities are safe for you. Lifestyle  Drink enough fluid to keep your pee (urine) pale yellow. Do not drink alcohol. Do not use any products that contain nicotine or tobacco, such as cigarettes, e-cigarettes, and chewing tobacco. If you need help quitting, ask your doctor. Be aware that: Your bronchitis will get worse if you smoke or breathe in other people's smoke (secondhand smoke). Your lungs will heal faster if you quit smoking. General instructions Take over-the-counter and prescription medicines only as told by your doctor. Use an inhaler, cool mist vaporizer, or humidifier as told by your doctor. Rinse your mouth often with salt water. To make salt water, dissolve -1 tsp (3-6 g) of salt in 1 cup (237 mL) of warm water. Take two teaspoons of honey at bedtime. This helps lessen your coughing at night. Keep all follow-up visits as told by your doctor. This is important. How is this prevented? To lower your risk of getting this condition again: Wash your hands often with soap and water. If you cannot use soap and water, use hand sanitizer. Avoid contact with people who have cold symptoms. Try not to touch your mouth, nose, or eyes with your hands. Make sure to get the flu shot every year. Contact a doctor if: Your symptoms do not get better in 2 weeks. You vomit more than once or twice. You have symptoms of loss of fluid from your body (dehydration). These include: Dark pee. Dry skin or eyes. Increased thirst. Headaches. Confusion. Muscle cramps. Get help right away if: You cough up blood. You have chest pain. You have very  bad shortness of breath. You become dehydrated. You faint or keep feeling like you are going to faint. You have a very bad headache. Your fever or chills get worse. These symptoms may be an emergency. Get help right away. Call your local emergency services (911 in the [place] Do not wait to see if the symptoms will  go away. Do not drive yourself to the hospital. Summary Acute bronchitis is when air tubes in the lungs (bronchi) suddenly get swollen. In adults, acute bronchitis usually goes away within 2 weeks. Take over-the-counter and prescription medicines only as told by your doctor. Drink enough fluid to keep your pee (urine) pale yellow. Contact a doctor if your symptoms do not improve after 2 weeks of treatment. Get help right away if you cough up blood, faint, or have chest pain or shortness of breath. This information is not intended to replace advice given to you by your health care provider. Make sure you discuss any questions you have with your health care provider. Document Revised: 11/26/2019 Document Reviewed: 07/19/2018 Elsevier Patient Education  [employer].

## 2020-10-12 NOTE — Telephone Encounter (Signed)
Unfortunately she is outside of the treatment window so she would not be a candidate.  Plus if she is feeling much better then she is doing well without extra medication which is fantastic.  Were happy to see her though if she has any problems or she feels like she is not continuing to improve.

## 2020-10-13 NOTE — Telephone Encounter (Signed)
Pt was seen on 10/12/20 by Luvenia Starch and was given a Rx for an antibiotic. She states she is feeling better.

## 2020-10-18 ENCOUNTER — Ambulatory Visit: Payer: Medicare Other | Admitting: Family Medicine

## 2020-10-20 ENCOUNTER — Other Ambulatory Visit: Payer: Medicare Other

## 2020-10-25 ENCOUNTER — Other Ambulatory Visit: Payer: Self-pay

## 2020-10-25 ENCOUNTER — Ambulatory Visit (INDEPENDENT_AMBULATORY_CARE_PROVIDER_SITE_OTHER): Payer: Medicare Other | Admitting: Family Medicine

## 2020-10-25 ENCOUNTER — Encounter: Payer: Self-pay | Admitting: Family Medicine

## 2020-10-25 VITALS — BP 122/72 | HR 104 | Ht 66.0 in | Wt 219.0 lb

## 2020-10-25 DIAGNOSIS — R229 Localized swelling, mass and lump, unspecified: Secondary | ICD-10-CM | POA: Diagnosis not present

## 2020-10-25 DIAGNOSIS — M79605 Pain in left leg: Secondary | ICD-10-CM | POA: Diagnosis not present

## 2020-10-25 NOTE — Progress Notes (Signed)
Acute Office Visit  Subjective:    Patient ID: Megan Crane, female    DOB: 14-Aug-1950, 70 y.o.   MRN: [id number]  Chief Complaint  Patient presents with   Rash    Pt reports that this began Thursday or Friday. She has redness and swelling on her L leg at thigh and behind her knee    HPI Patient is in today for left leg pain and redness.  She says she has been more sedentary since COVID 1.  She has been spending a lot of time in her recliner.  She says last Tuesday she said she just "felt terrible" all day just extremely tired.  She never ran a fever had chills etc.  But then the next day is when she first noticed a little lump on the left inner thigh and since then has noticed more redness and firmness and heat and discomfort.  No injury or trauma that she is aware of.  Past Medical History:  Diagnosis Date   Anxiety disorder    hx vasovagal responses   Headache(784.0)    History of kidney stones    Hydronephrosis, right    Hyperlipidemia    Hypertension    Nephrolithiasis    Positive nasal culture for methicillin resistant Staphylococcus aureus    Right ureteral stone    Urinary incontinence    sees Dr. Wendy Poet     Past Surgical History:  Procedure Laterality Date   COLONOSCOPY  12/28/2015   per Dr. Havery Moros, serrated polyps and diverticula, repeat in 3 yrs    CYSTOSCOPY  06/09/2011   Procedure: CYSTOSCOPY;  Surgeon: Reece Packer, MD;  Location: [facility];  Service: Urology;  Laterality: N/A;   CYSTOSCOPY W/ URETERAL STENT PLACEMENT  02/03/2014   Procedure: CYSTOSCOPY WITH  RIGHT RETROGRADE PYELOGRAM/ RIGHT URETERAL STENT PLACEMENT;  Surgeon: Malka So, MD;  Location: [facility];  Service: Urology;;   CYSTOSCOPY WITH BIOPSY  02/03/2014   Procedure: CYSTOSCOPY WITH BIOPSY;  Surgeon: Malka So, MD;  Location: [facility];  Service: Urology;;   PILONIDAL CYST [place]  06/09/2011   Procedure: POSTERIOR  REPAIR (RECTOCELE);  Surgeon: Reece Packer, MD;  Location: [facility];  Service: Urology;  Laterality: N/A;  Xenform graft 6x10   RIGHT URETEROSCOPIC STONE EXTRACTION / STENT PLACEMENT  03-13-2000   URETEROSCOPY Right 02/03/2014   Procedure: URETEROSCOPY WITH MANAGEMENT OF URETERAL STRICTURE;  Surgeon: Malka So, MD;  Location: [facility];  Service: Urology;  Laterality: Right;   VAGINAL HYSTERECTOMY  06/09/2011   Procedure: HYSTERECTOMY VAGINAL;  Surgeon: Sharene Butters, MD;  Location: [facility];  Service: Gynecology;  Laterality: N/A;   VAGINAL PROLAPSE REPAIR  06/09/2011   Procedure: VAGINAL VAULT SUSPENSION;  Surgeon: Reece Packer, MD;  Location: [facility];  Service: Urology;  Laterality: N/A;   VEIN LIGATION AND STRIPPING     LEFT LEG    Family History  Problem Relation Age of Onset   Cancer Father        kidney, bladder    Coronary artery disease Other    Diabetes Other    Hyperlipidemia Other    Kidney disease Other    Cancer Other        lung   Colon cancer Mother 63    Social History   Socioeconomic History   Marital status: Married    Spouse name: Not on file   Number  of children: Not on file   Years of education: Not on file   Highest education level: Not on file  Occupational History   Not on file  Tobacco Use   Smoking status: Never   Smokeless tobacco: Never  Vaping Use   Vaping Use: Never used  Substance and Sexual Activity   Alcohol use: Yes    Alcohol/week: 0.0 standard drinks    Comment: occ   Drug use: No   Sexual activity: Not Currently    Partners: Male  Other Topics Concern   Not on file  Social History Narrative   Not on file   Social Determinants of Health   Financial Resource Strain: Low Risk    Difficulty of Paying Living Expenses: Not hard at all  Food Insecurity: No Food Insecurity   Worried About Charity fundraiser in the Last Year: Never true   [place] in the Last Year: Never true  Transportation  Needs: No Transportation Needs   Lack of Transportation (Medical): No   Lack of Transportation (Non-Medical): No  Physical Activity: Inactive   Days of Exercise per Week: 0 days   Minutes of Exercise per Session: 0 min  Stress: No Stress Concern Present   Feeling of Stress : Not at all  Social Connections: Socially Integrated   Frequency of Communication with Friends and Family: Three times a week   Frequency of Social Gatherings with Friends and Family: Three times a week   Attends Religious Services: 1 to 4 times per year   Active Member of Clubs or Organizations: Yes   Attends Archivist Meetings: 1 to 4 times per year   Marital Status: Married  Human resources officer Violence: Not At Risk   Fear of Current or Ex-Partner: No   Emotionally Abused: No   Physically Abused: No   Sexually Abused: No    Outpatient Medications Prior to Visit  Medication Sig Dispense Refill   ALPRAZolam (XANAX) 0.25 MG tablet Take 1-2 tablets (0.25-0.5 mg total) by mouth 2 (two) times daily as needed for anxiety. 15 tablet 0   aspirin 81 MG EC tablet Take 81 mg by mouth daily. Swallow whole.     Cholecalciferol (VITAMIN D-3) 25 MCG (1000 UT) CAPS Take by mouth.     ciclopirox (PENLAC) 8 % solution Apply topically at bedtime. Apply over nail and surrounding skin. Apply daily over previous coat. After seven (7) days, may remove with alcohol and continue cycle. If insurance not covering this, please run w/ GoodRx coupon 19.8 mL 3   Cyanocobalamin (VITAMIN B 12) 500 MCG TABS Take by mouth.     fluticasone (FLONASE) 50 MCG/ACT nasal spray Place 1 spray into both nostrils daily. 16 g 11   hydrochlorothiazide (HYDRODIURIL) 25 MG tablet Take 1 tablet (25 mg total) by mouth daily. 90 tablet 3   NON FORMULARY      solifenacin (VESICARE) 5 MG tablet Take 1 tablet (5 mg total) by mouth daily. 90 tablet 3   terbinafine (LAMISIL) 1 % cream Apply to affected area BID until rash gone, then apply 2 more days. 30 g 3    valsartan (DIOVAN) 40 MG tablet Take 1 tablet (40 mg total) by mouth daily. 90 tablet 3   vitamin C (ASCORBIC ACID) 500 MG tablet Take 500 mg by mouth daily.     Zinc 30 MG TABS Take by mouth.     azithromycin (ZITHROMAX Z-PAK) 250 MG tablet Take 2 tablets (500 mg)  on  Day 1,  followed by 1 tablet (250 mg) once daily on Days 2 through 5. 6 tablet 0   benzonatate (TESSALON) 200 MG capsule Take 1 capsule (200 mg total) by mouth 2 (two) times daily as needed for cough. 20 capsule 0   dexamethasone (DECADRON) 4 MG tablet Take 1 tablet (4 mg total) by mouth 2 (two) times daily with a meal. 10 tablet 0   fluticasone (FLONASE) 50 MCG/ACT nasal spray Place 2 sprays into both nostrils daily. 16 g 2   0.9 %  sodium chloride infusion      No facility-administered medications prior to visit.    Allergies  Allergen Reactions   Lidocaine Anaphylaxis, Shortness Of Breath and Other (See Comments)    "Eyes crossed"   Escitalopram     Sleepy   Levofloxacin Itching and Other (See Comments)    disoriented   Levofloxacin Itching and Other (See Comments)    disoriented   Oxycodone-Aspirin Other (See Comments)    "VERY DIZZY"    Review of Systems     Objective:    Physical Exam Constitutional:      Appearance: She is well-developed.  HENT:     Head: Normocephalic and atraumatic.  Cardiovascular:     Rate and Rhythm: Normal rate and regular rhythm.     Heart sounds: Normal heart sounds.  Pulmonary:     Effort: Pulmonary effort is normal.     Breath sounds: Normal breath sounds.  Skin:    General: Skin is warm and dry.     Comments: A very large erythematous area on the medial left upper thigh a smaller area on the lower part of the upper thigh and then right around the knee crease.  There are palpable knots underneath the skin where the erythema is present.  No open wound or drainage.  Neurological:     Mental Status: She is alert and oriented to person, place, and time.  Psychiatric:         Behavior: Behavior normal.      BP 122/72   Pulse (!) 104   Ht _0  (1.676 m)   Wt 219 lb (99.3 kg)   SpO2 97%   BMI 35.35 kg/m  Wt Readings from Last 3 Encounters:  10/25/20 219 lb (99.3 kg)  10/12/20 216 lb (98 kg)  10/07/20 217 lb (98.4 kg)    Health Maintenance Due  Topic Date Due   Hepatitis C Screening  Never done   DEXA SCAN  10/14/2017   COLONOSCOPY (Pts 45-71yr Insurance coverage will need to be confirmed)  12/28/2018    There are no preventive care reminders to display for this patient.   Lab Results  Component Value Date   TSH 1.43 04/02/2020   Lab Results  Component Value Date   WBC 5.9 04/02/2020   HGB 14.1 04/02/2020   HCT 41.9 04/02/2020   MCV 89.9 04/02/2020   PLT 195.0 04/02/2020   Lab Results  Component Value Date   NA 139 08/03/2020   K 4.7 08/03/2020   CO2 31 08/03/2020   GLUCOSE 91 08/03/2020   BUN 17 08/03/2020   CREATININE 0.80 08/03/2020   BILITOT 0.5 08/03/2020   ALKPHOS 69 04/02/2020   AST 18 08/03/2020   ALT 19 08/03/2020   PROT 6.9 08/03/2020   ALBUMIN 4.6 04/02/2020   CALCIUM 10.1 08/03/2020   EGFR 81 08/03/2020   GFR 71.88 04/02/2020   Lab Results  Component Value Date  CHOL 226 (H) 08/03/2020   Lab Results  Component Value Date   HDL 46 (L) 08/03/2020   Lab Results  Component Value Date   LDLCALC 150 (H) 08/03/2020   Lab Results  Component Value Date   TRIG 164 (H) 08/03/2020   Lab Results  Component Value Date   CHOLHDL 4.9 08/03/2020   Lab Results  Component Value Date   HGBA1C 5.8 12/21/2017       Assessment & Plan:   Problem List Items Addressed This Visit   None Visit Diagnoses     Left leg pain    -  Primary   Relevant Orders   US Venous Img Lower Unilateral Left   Antistreptolysin O titer   Sedimentation rate   C-reactive protein   COMPLETE METABOLIC PANEL WITH GFR   CBC with Differential/Platelet   Skin nodule       Relevant Orders   Antistreptolysin O titer   Sedimentation  rate   C-reactive protein   COMPLETE METABOLIC PANEL WITH GFR   CBC with Differential/Platelet       Left leg pain with significant erythema bone palpable knots underneath the erythema.  Consider superficial thrombophlebitis versus erythema nodosum.  We will do some additional lab work and schedule for venous Doppler.  No orders of the defined types were placed in this encounter.    Beatrice Lecher, MD

## 2020-10-26 ENCOUNTER — Ambulatory Visit (INDEPENDENT_AMBULATORY_CARE_PROVIDER_SITE_OTHER): Payer: Medicare Other

## 2020-10-26 DIAGNOSIS — M79605 Pain in left leg: Secondary | ICD-10-CM

## 2020-10-26 DIAGNOSIS — I8002 Phlebitis and thrombophlebitis of superficial vessels of left lower extremity: Secondary | ICD-10-CM | POA: Diagnosis not present

## 2020-10-26 LAB — COMPLETE METABOLIC PANEL WITHOUT GFR
AG Ratio: 1.5 (calc) (ref 1.0–2.5)
ALT: 23 U/L (ref 6–29)
AST: 19 U/L (ref 10–35)
Albumin: 3.9 g/dL (ref 3.6–5.1)
Alkaline phosphatase (APISO): 63 U/L (ref 37–153)
BUN: 19 mg/dL (ref 7–25)
CO2: 29 mmol/L (ref 20–32)
Calcium: 9.8 mg/dL (ref 8.6–10.4)
Chloride: 102 mmol/L (ref 98–110)
Creat: 0.87 mg/dL (ref 0.50–1.05)
Globulin: 2.6 g/dL (ref 1.9–3.7)
Glucose, Bld: 96 mg/dL (ref 65–99)
Potassium: 4 mmol/L (ref 3.5–5.3)
Sodium: 139 mmol/L (ref 135–146)
Total Bilirubin: 0.4 mg/dL (ref 0.2–1.2)
Total Protein: 6.5 g/dL (ref 6.1–8.1)
eGFR: 73 mL/min/1.73m2

## 2020-10-26 LAB — CBC WITH DIFFERENTIAL/PLATELET
Absolute Monocytes: 824 cells/uL (ref 200–950)
Basophils Absolute: 41 cells/uL (ref 0–200)
Basophils Relative: 0.4 %
Eosinophils Absolute: 155 cells/uL (ref 15–500)
Eosinophils Relative: 1.5 %
HCT: 39.3 % (ref 35.0–45.0)
Hemoglobin: 13.2 g/dL (ref 11.7–15.5)
Lymphs Abs: 1947 cells/uL (ref 850–3900)
MCH: 30.1 pg (ref 27.0–33.0)
MCHC: 33.6 g/dL (ref 32.0–36.0)
MCV: 89.5 fL (ref 80.0–100.0)
MPV: 11.9 fL (ref 7.5–12.5)
Monocytes Relative: 8 %
Neutro Abs: 7334 cells/uL (ref 1500–7800)
Neutrophils Relative %: 71.2 %
Platelets: 180 10*3/uL (ref 140–400)
RBC: 4.39 10*6/uL (ref 3.80–5.10)
RDW: 13.2 % (ref 11.0–15.0)
Total Lymphocyte: 18.9 %
WBC: 10.3 10*3/uL (ref 3.8–10.8)

## 2020-10-26 LAB — SEDIMENTATION RATE: Sed Rate: 11 mm/h (ref 0–30)

## 2020-10-26 LAB — C-REACTIVE PROTEIN: CRP: 45.2 mg/L — ABNORMAL HIGH (ref <8.0)

## 2020-10-26 LAB — ANTISTREPTOLYSIN O TITER: ASO: <50 IU/mL (ref <200)

## 2020-10-26 NOTE — Progress Notes (Signed)
Call patient: Labs overall look good.  One of the inflammatory markers was elevated but the other 1 was normal.  So no sign of autoimmune disorder.

## 2020-10-26 NOTE — Progress Notes (Signed)
Call patient: Ultrasound rules out any deep clots which is great news.  This is superficial thrombophlebitis.  This is where the blood vessel is inflamed because of a clot blockage.  These are painful but they are not the kind that are concerning for causing a stroke etc. so the recommended treatment is taking the anti-inflammatory which thins the blood slightly and using compression with an Ace wrap.  I would recommend taking 1 Aleve twice a day, or 3 ibuprofen, 3 times a day for the next week.  The compression is very important.  We are happy to wrap you here if you would like.  But if you feel comfortable doing that on your own at home and you have some Ace wraps at home, then that is fine as well.  You will probably need to wrap the areas for 1 to 2 weeks.  Until the redness and swelling is improving.  I would like to see you back in about 2 weeks just to make sure that everything is healing.

## 2020-10-29 ENCOUNTER — Ambulatory Visit: Payer: Medicare Other

## 2020-10-29 ENCOUNTER — Emergency Department (HOSPITAL_COMMUNITY)
Admission: EM | Admit: 2020-10-29 | Discharge: 2020-10-29 | Disposition: A | Discharge status: Home / Self Care | Payer: Medicare Other | Attending: Emergency Medicine | Admitting: Emergency Medicine

## 2020-10-29 ENCOUNTER — Emergency Department (HOSPITAL_BASED_OUTPATIENT_CLINIC_OR_DEPARTMENT_OTHER): Admit: 2020-10-29 | Discharge: 2020-10-29 | Disposition: A | Discharge status: Home / Self Care | Payer: Medicare Other

## 2020-10-29 DIAGNOSIS — I8002 Phlebitis and thrombophlebitis of superficial vessels of left lower extremity: Secondary | ICD-10-CM | POA: Insufficient documentation

## 2020-10-29 DIAGNOSIS — I10 Essential (primary) hypertension: Secondary | ICD-10-CM | POA: Diagnosis not present

## 2020-10-29 DIAGNOSIS — M7989 Other specified soft tissue disorders: Secondary | ICD-10-CM | POA: Diagnosis not present

## 2020-10-29 DIAGNOSIS — M79606 Pain in leg, unspecified: Secondary | ICD-10-CM | POA: Diagnosis present

## 2020-10-29 LAB — BASIC METABOLIC PANEL
Anion gap: 8 (ref 5–15)
BUN: 12 mg/dL (ref 8–23)
CO2: 26 mmol/L (ref 22–32)
Calcium: 9.4 mg/dL (ref 8.9–10.3)
Chloride: 106 mmol/L (ref 98–111)
Creatinine, Ser: 0.89 mg/dL (ref 0.44–1.00)
GFR, Estimated: >60 mL/min (ref >60)
Glucose, Bld: 101 mg/dL — ABNORMAL HIGH (ref 70–99)
Potassium: 4.1 mmol/L (ref 3.5–5.1)
Sodium: 140 mmol/L (ref 135–145)

## 2020-10-29 LAB — CBC
HCT: 39.3 % (ref 36.0–46.0)
Hemoglobin: 12.9 g/dL (ref 12.0–15.0)
MCH: 29.8 pg (ref 26.0–34.0)
MCHC: 32.8 g/dL (ref 30.0–36.0)
MCV: 90.8 fL (ref 80.0–100.0)
Platelets: 199 10*3/uL (ref 150–400)
RBC: 4.33 MIL/uL (ref 3.87–5.11)
RDW: 13.9 % (ref 11.5–15.5)
WBC: 6.6 10*3/uL (ref 4.0–10.5)
nRBC: 0 % (ref 0.0–0.2)

## 2020-10-29 LAB — PROTIME-INR
INR: 1 (ref 0.8–1.2)
Prothrombin Time: 13 seconds (ref 11.4–15.2)

## 2020-10-29 MED ORDER — CEPHALEXIN 500 MG PO CAPS: 500.0000 mg | ORAL_CAPSULE | Freq: Four times a day (QID) | ORAL | 0 refills | Status: DC

## 2020-10-29 NOTE — ED Provider Notes (Signed)
Emergency Medicine Provider Triage Evaluation Note  Megan Crane , a 70 y.o. female  was evaluated in triage.  Pt complains of swelling and pain to the left thigh into medial aspect of leg and behind the knee.  She reports recently being diagnosed with superficial thrombophlebitis.  Symptoms started 10 days ago.  She was seen by her primary care physician and had an MRI done.  She has been doing some conservative therapy wearing an Ace wrap with some elevation of the leg but feels that the symptoms are getting worse with more pain, redness, and swelling.  Review of Systems  Positive: Patient with recent COVID diagnosis, Negative: Negative history of DVT or risk factors besides COVID  Physical Exam  BP (!) 157/106 (BP Location: Right Arm)   Pulse 98   Temp 99.4 F (37.4 C) (Oral)   Resp 16   SpO2 100%  Gen:   Awake, no distress   Resp:  Normal effort  MSK:   Moves extremities without difficulty redness with cords along upper thigh consistent with superficial thrombophlebitis, some erythema in the medial aspect of the leg and behind knee Other:  Heart is regular rate and rhythm lungs are clear  Medical Decision Making  Medically screening exam initiated at 10:21 AM.  Appropriate orders placed.  Megan Crane was informed that the remainder of the evaluation will be completed by another provider, this initial triage assessment does not replace that evaluation, and the importance of remaining in the ED until their evaluation is complete.  Plan Doppler study of left lower extremity.  If negative would consider adding Keflex and will continue conservative therapy for superficial thrombophlebitis   Pattricia Boss, MD 10/29/20 1022

## 2020-10-29 NOTE — ED Triage Notes (Signed)
Pt. Stated, I might have a blood clot in left leg.for about 10 days , went to Dr. And said it was superficial

## 2020-10-29 NOTE — ED Provider Notes (Signed)
[facility] EMERGENCY DEPARTMENT Provider Note   CSN: [id number] Arrival date & time: 10/29/20  2725     History Chief Complaint  Patient presents with   Leg Pain    Megan Crane is a 70 y.o. female.  70 year old female presents today for evaluation of left lower extremity redness and swelling of 10-day duration.  Patient was evaluated by PCP and patient states she had an MRI done which was significant for superficial thrombophlebitis and negative for DVT.  She reports she was instructed on supportive care with ibuprofen, compression but has not had any relief.  She reports she has noticed some increased swelling and redness.  She denies pain, difficulty ambulating, dyspnea, chest pain.  Husband also at bedside who states patient has been noncompliant with her ibuprofen.  The history is provided by the patient. No language interpreter was used.      Past Medical History:  Diagnosis Date   Anxiety disorder    hx vasovagal responses   Headache(784.0)    History of kidney stones    Hydronephrosis, right    Hyperlipidemia    Hypertension    Nephrolithiasis    Positive nasal culture for methicillin resistant Staphylococcus aureus    Right ureteral stone    Urinary incontinence    sees Dr. 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 Havery Moros, serrated polyps and diverticula, repeat in 3 yrs    CYSTOSCOPY  06/09/2011   Procedure: CYSTOSCOPY;  Surgeon: Reece Packer, MD;  Location: [facility];  Service: Urology;  Laterality: N/A;   CYSTOSCOPY W/ URETERAL STENT PLACEMENT  02/03/2014   Procedure: CYSTOSCOPY WITH  RIGHT RETROGRADE PYELOGRAM/ RIGHT URETERAL STENT PLACEMENT;  Surgeon: Malka So, MD;  Location: [facility];  Service: Urology;;   CYSTOSCOPY WITH BIOPSY  02/03/2014   Procedure: CYSTOSCOPY WITH BIOPSY;  Surgeon: Malka So, MD;  Location: [facility];  Service: Urology;;   PILONIDAL CYST [place]  06/09/2011   Procedure: POSTERIOR REPAIR (RECTOCELE);  Surgeon: Reece Packer, MD;  Location: [facility];  Service: Urology;  Laterality: N/A;  Xenform graft 6x10   RIGHT URETEROSCOPIC STONE EXTRACTION / STENT PLACEMENT  03-13-2000   URETEROSCOPY Right 02/03/2014   Procedure: URETEROSCOPY WITH MANAGEMENT OF URETERAL STRICTURE;  Surgeon: Malka So, MD;  Location: [facility];  Service: Urology;  Laterality: Right;   VAGINAL HYSTERECTOMY  06/09/2011   Procedure: HYSTERECTOMY VAGINAL;  Surgeon: Sharene Butters, MD;  Location: [facility];  Service: Gynecology;  Laterality: N/A;   VAGINAL PROLAPSE REPAIR  06/09/2011   Procedure: VAGINAL VAULT SUSPENSION;  Surgeon: Reece Packer, MD;  Location: [facility];  Service: Urology;  Laterality: N/A;   VEIN LIGATION AND STRIPPING     LEFT LEG  OB History   No obstetric history on file.     Family History  Problem Relation Age of Onset   Cancer Father        kidney, bladder    Coronary artery disease Other    Diabetes Other    Hyperlipidemia Other    Kidney disease Other    Cancer Other        lung   Colon cancer Mother 70    Social History   Tobacco Use   Smoking status: Never   Smokeless tobacco:  Never  Vaping Use   Vaping Use: Never used  Substance Use Topics   Alcohol use: Yes    Alcohol/week: 0.0 standard drinks    Comment: occ   Drug use: No    Home Medications Prior to Admission medications   Medication Sig Start Date End Date Taking? Authorizing Provider  ALPRAZolam (XANAX) 0.25 MG tablet Take 1-2 tablets (0.25-0.5 mg total) by mouth 2 (two) times daily as needed for anxiety. 09/15/20   Emeterio Reeve, DO  aspirin 81 MG EC tablet Take 81 mg by mouth daily. Swallow whole.    [provider]  Cholecalciferol (VITAMIN D-3) 25 MCG (1000 UT) CAPS Take by mouth.    [provider]  ciclopirox (PENLAC) 8 % solution Apply topically at bedtime. Apply over nail and surrounding skin. Apply daily over previous coat. After seven (7) days, may remove with alcohol and continue cycle. If insurance not covering this, please run w/ GoodRx coupon 09/15/20   Emeterio Reeve, DO  Cyanocobalamin (VITAMIN B 12) 500 MCG TABS Take by mouth.    [provider]  fluticasone (FLONASE) 50 MCG/ACT nasal spray Place 1 spray into both nostrils daily. 02/03/20   Laurey Morale, MD  hydrochlorothiazide (HYDRODIURIL) 25 MG tablet Take 1 tablet (25 mg total) by mouth daily. 08/02/20   Emeterio Reeve, DO  NON FORMULARY     [provider]  solifenacin (VESICARE) 5 MG tablet Take 1 tablet (5 mg total) by mouth daily. 09/03/19   Laurey Morale, MD  terbinafine (LAMISIL) 1 % cream Apply to affected area BID until rash gone, then apply 2 more days. 09/15/20   Emeterio Reeve, DO  valsartan (DIOVAN) 40 MG tablet Take 1 tablet (40 mg total) by mouth daily. 08/02/20   Emeterio Reeve, DO  vitamin C (ASCORBIC ACID) 500 MG tablet Take 500 mg by mouth daily.    [provider]  Zinc 30 MG TABS Take by mouth.    [provider]    Allergies    Lidocaine, Escitalopram, Levofloxacin, Levofloxacin, and Oxycodone-aspirin  Review of Systems   Review of Systems   Respiratory:  Negative for shortness of breath.    Physical Exam Updated Vital Signs BP (!) 157/106 (BP Location: Right Arm)   Pulse 98   Temp 99.4 F (37.4 C) (Oral)   Resp 16   SpO2 100%   Physical Exam  ED Results / Procedures / Treatments   Labs (all labs ordered are listed, but only abnormal results are displayed) Labs Reviewed  BASIC METABOLIC PANEL - Abnormal; Notable for the following components:      Result Value   Glucose, Bld 101 (*)    All other components within normal limits  CBC  PROTIME-INR    EKG None  Radiology No results found.  Procedures Procedures   Medications Ordered in ED Medications - No data to display  ED Course  I have reviewed the triage  vital signs and the nursing notes.  Pertinent labs & imaging results that were available during my care of the patient were reviewed by me and considered in my medical decision making (see chart for details).  Clinical Course as of 10/29/20 1400  Fri Oct 29, 2020  1213 VAS [place] LOWER EXTREMITY VENOUS (DVT) (7a-7p) [AA]    Clinical Course User Index [AA] Evlyn Courier, PA-C   MDM Rules/Calculators/A&P                           70 year old female presents for worsening swelling and redness of her left medial thigh.  She states she had an MRI done but per chart review it appears she had Doppler done on 10/18 which was negative for DVT and significant for superficial thrombophlebitis.  Asides from worsening redness she is without other new complaints.  Repeat Doppler ordered to rule out DVT.  Repeat Doppler negative for DVT.  Superficial thrombophlebitis consistent with previous Doppler present.  Keflex sent to patient's chosen pharmacy.  Discussed continuing ibuprofen, elevation of her leg, compression device.  Patient voices understanding and is in agreement with plan.  Discussed if she goes on long drives to take frequent breaks.   Final Clinical Impression(s) / ED Diagnoses Final diagnoses:  None     Rx / DC Orders ED Discharge Orders     None        Evlyn Courier, PA-C 10/29/20 [place], [place], DO 10/29/20 1448

## 2020-10-29 NOTE — Discharge Instructions (Addendum)
Your ultrasound was negative for DVT today.  It shows the same superficial thrombophlebitis from a few days ago.  As discussed continue taking the ibuprofen, start using cool compress, continue with compression as well.  Elevate your leg as best as you can.  Take Keflex as directed.  Return to the emergency room with the swelling significantly worsens, pain significantly increases, or develop shortness of breath otherwise you can follow-up with your PCP.

## 2020-10-29 NOTE — Progress Notes (Signed)
Left lower extremity venous duplex has been completed. Preliminary results can be found in CV Proc through chart review.  Results were given to Dr. Jeanell Sparrow.  10/29/20 1:33 PM Megan Crane RVT

## 2020-11-02 ENCOUNTER — Ambulatory Visit: Payer: Medicare Other | Admitting: Family Medicine

## 2020-11-09 ENCOUNTER — Ambulatory Visit (INDEPENDENT_AMBULATORY_CARE_PROVIDER_SITE_OTHER): Payer: Medicare Other | Admitting: Family Medicine

## 2020-11-09 ENCOUNTER — Other Ambulatory Visit: Payer: Self-pay

## 2020-11-09 ENCOUNTER — Ambulatory Visit: Payer: Medicare Other | Admitting: Family Medicine

## 2020-11-09 VITALS — BP 140/86 | HR 95 | Temp 97.9°F | Wt 216.4 lb

## 2020-11-09 DIAGNOSIS — I8002 Phlebitis and thrombophlebitis of superficial vessels of left lower extremity: Secondary | ICD-10-CM

## 2020-11-09 DIAGNOSIS — I83893 Varicose veins of bilateral lower extremities with other complications: Secondary | ICD-10-CM | POA: Diagnosis not present

## 2020-11-09 NOTE — Progress Notes (Signed)
Established Patient Office Visit  Subjective:  Patient ID: Megan Crane, female    DOB: [birth year]  Age: 70 y.o. MRN: [id number]  CC:  Chief Complaint  Patient presents with   Hospitalization Follow-up    HPI [name] presents for follow-up from recent ER evaluation.  She is reestablishing with Dr. Sarajane Jews and has follow-up with him soon.  Her recent history is that she was diagnosed with COVID infection September 27.  She seemed to be recovered from that but recently developed some slightly raised painful "whelp like" lesions (per pt description) left thigh.  She describes these as "whelps ".  She went to another primary care provider and on 18 October had venous Doppler which showed no evidence for DVT but did show some superficial thrombophlebitis.  She was prescribed ibuprofen and instructed to use compression locally.  Symptoms did not improve and then she went to the ER on the 21st and had repeat venous Dopplers which again did not show DVT but showed superficial thrombophlebitis.  She was then placed on Keflex and she feels like her symptoms have improved some.  She was also started on thigh-high compression.     denies any history of DVT.  Denies any recent chest pains or pleuritic pain.  No dyspnea.  Pain is gradually improving.  She has significant varicosities in both lower extremities.  Her main concern today is that she and her husband are planning to drive to [place] to visit grandchildren and family soon and she had questions regarding safety with driving  Past Medical History:  Diagnosis Date   Anxiety disorder    hx vasovagal responses   Headache(784.0)    History of kidney stones    Hydronephrosis, right    Hyperlipidemia    Hypertension    Nephrolithiasis    Positive nasal culture for methicillin resistant Staphylococcus aureus    Right ureteral stone    Urinary incontinence    sees Dr. Wendy Poet     Past Surgical History:  Procedure Laterality Date    COLONOSCOPY  12/28/2015   per Dr. Havery Moros, serrated polyps and diverticula, repeat in 3 yrs    CYSTOSCOPY  06/09/2011   Procedure: CYSTOSCOPY;  Surgeon: Reece Packer, MD;  Location: [facility];  Service: Urology;  Laterality: N/A;   CYSTOSCOPY W/ URETERAL STENT PLACEMENT  02/03/2014   Procedure: CYSTOSCOPY WITH  RIGHT RETROGRADE PYELOGRAM/ RIGHT URETERAL STENT PLACEMENT;  Surgeon: Malka So, MD;  Location: [facility];  Service: Urology;;   CYSTOSCOPY WITH BIOPSY  02/03/2014   Procedure: CYSTOSCOPY WITH BIOPSY;  Surgeon: Malka So, MD;  Location: [facility];  Service: Urology;;   PILONIDAL CYST [place]  06/09/2011   Procedure: POSTERIOR REPAIR (RECTOCELE);  Surgeon: Reece Packer, MD;  Location: [facility];  Service: Urology;  Laterality: N/A;  Xenform graft 6x10   RIGHT URETEROSCOPIC STONE EXTRACTION / STENT PLACEMENT  03-13-2000   URETEROSCOPY Right 02/03/2014   Procedure: URETEROSCOPY WITH MANAGEMENT OF URETERAL STRICTURE;  Surgeon: Malka So, MD;  Location: [facility];  Service: Urology;  Laterality: Right;   VAGINAL HYSTERECTOMY  06/09/2011   Procedure: HYSTERECTOMY VAGINAL;  Surgeon: Sharene Butters, MD;  Location: [facility];  Service: Gynecology;  Laterality: N/A;   VAGINAL PROLAPSE REPAIR  06/09/2011   Procedure: VAGINAL VAULT SUSPENSION;  Surgeon: Reece Packer, MD;  Location: [facility];  Service: Urology;  Laterality: N/A;  VEIN LIGATION AND STRIPPING     LEFT LEG    Family History  Problem Relation Age of Onset   Cancer Father        kidney, bladder    Coronary artery disease Other    Diabetes Other    Hyperlipidemia Other    Kidney disease Other    Cancer Other        lung   Colon cancer Mother 21    Social History   Socioeconomic History   Marital status: Married    Spouse name: Not on file   Number of children: Not on file   Years of education: Not on file   Highest education  level: Not on file  Occupational History   Not on file  Tobacco Use   Smoking status: Never   Smokeless tobacco: Never  Vaping Use   Vaping Use: Never used  Substance and Sexual Activity   Alcohol use: Yes    Alcohol/week: 0.0 standard drinks    Comment: occ   Drug use: No   Sexual activity: Not Currently    Partners: Male  Other Topics Concern   Not on file  Social History Narrative   Not on file   Social Determinants of Health   Financial Resource Strain: Low Risk    Difficulty of Paying Living Expenses: Not hard at all  Food Insecurity: No Food Insecurity   Worried About Charity fundraiser in the Last Year: Never true   [place] in the Last Year: Never true  Transportation Needs: No Transportation Needs   Lack of Transportation (Medical): No   Lack of Transportation (Non-Medical): No  Physical Activity: Inactive   Days of Exercise per Week: 0 days   Minutes of Exercise per Session: 0 min  Stress: No Stress Concern Present   Feeling of Stress : Not at all  Social Connections: Socially Integrated   Frequency of Communication with Friends and Family: Three times a week   Frequency of Social Gatherings with Friends and Family: Three times a week   Attends Religious Services: 1 to 4 times per year   Active Member of Clubs or Organizations: Yes   Attends Archivist Meetings: 1 to 4 times per year   Marital Status: Married  Human resources officer Violence: Not At Risk   Fear of Current or Ex-Partner: No   Emotionally Abused: No   Physically Abused: No   Sexually Abused: No    Outpatient Medications Prior to Visit  Medication Sig Dispense Refill   ALPRAZolam (XANAX) 0.25 MG tablet Take 1-2 tablets (0.25-0.5 mg total) by mouth 2 (two) times daily as needed for anxiety. 15 tablet 0   cephALEXin (KEFLEX) 500 MG capsule Take 1 capsule (500 mg total) by mouth 4 (four) times daily. 20 capsule 0   Cholecalciferol (VITAMIN D-3) 25 MCG (1000 UT) CAPS Take 1 capsule  by mouth daily.     ciclopirox (PENLAC) 8 % solution Apply topically at bedtime. Apply over nail and surrounding skin. Apply daily over previous coat. After seven (7) days, may remove with alcohol and continue cycle. If insurance not covering this, please run w/ GoodRx coupon 19.8 mL 3   Cyanocobalamin (VITAMIN B 12) 500 MCG TABS Take 1 tablet by mouth daily.     fluticasone (FLONASE) 50 MCG/ACT nasal spray Place 1 spray into both nostrils daily. 16 g 11   hydrochlorothiazide (HYDRODIURIL) 25 MG tablet Take 1 tablet (25 mg total) by mouth daily.  90 tablet 3   solifenacin (VESICARE) 5 MG tablet Take 1 tablet (5 mg total) by mouth daily. 90 tablet 3   terbinafine (LAMISIL) 1 % cream Apply to affected area BID until rash gone, then apply 2 more days. 30 g 3   valsartan (DIOVAN) 40 MG tablet Take 1 tablet (40 mg total) by mouth daily. 90 tablet 3   vitamin C (ASCORBIC ACID) 500 MG tablet Take 500 mg by mouth daily.     Zinc 30 MG TABS Take 1 tablet by mouth daily.     No facility-administered medications prior to visit.    Allergies  Allergen Reactions   Lidocaine Anaphylaxis, Shortness Of Breath and Other (See Comments)    "Eyes crossed"   Escitalopram     Sleepy   Levofloxacin Itching and Other (See Comments)    disoriented   Oxycodone-Aspirin Other (See Comments)    "VERY DIZZY"    ROS Review of Systems  Constitutional:  Negative for fatigue.  Eyes:  Negative for visual disturbance.  Respiratory:  Negative for cough, chest tightness, shortness of breath and wheezing.   Cardiovascular:  Negative for chest pain and palpitations.  Neurological:  Negative for dizziness, seizures, syncope, weakness, light-headedness and headaches.     Objective:    Physical Exam Vitals reviewed.  Constitutional:      Appearance: Normal appearance.  Cardiovascular:     Rate and Rhythm: Normal rate and regular rhythm.  Pulmonary:     Effort: Pulmonary effort is normal.     Breath sounds: Normal  breath sounds.  Musculoskeletal:     Comments: She has significant varicosities in both lower extremities including calves and thighs.  She has some areas of induration left anterior thigh.  No erythema.  No warmth noted at this time.  These apparently correlate with areas of superficial thrombophlebitis from recent Dopplers.  Neurological:     Mental Status: She is alert.    BP 140/86 (BP Location: Left Arm, Patient Position: Sitting, Cuff Size: Normal)   Pulse 95   Temp 97.9 F (36.6 C) (Oral)   Wt 216 lb 6.4 oz (98.2 kg)   SpO2 98%   BMI 34.93 kg/m  Wt Readings from Last 3 Encounters:  11/09/20 216 lb 6.4 oz (98.2 kg)  10/25/20 219 lb (99.3 kg)  10/12/20 216 lb (98 kg)     Health Maintenance Due  Topic Date Due   Hepatitis C Screening  Never done   DEXA SCAN  10/14/2017   COLONOSCOPY (Pts 45-95yr Insurance coverage will need to be confirmed)  12/28/2018   Pneumonia Vaccine 68 Years old (2 - PPSV23 if available, else PCV20) 10/02/2019    There are no preventive care reminders to display for this patient.  Lab Results  Component Value Date   TSH 1.43 04/02/2020   Lab Results  Component Value Date   WBC 6.6 10/29/2020   HGB 12.9 10/29/2020   HCT 39.3 10/29/2020   MCV 90.8 10/29/2020   PLT 199 10/29/2020   Lab Results  Component Value Date   NA 140 10/29/2020   K 4.1 10/29/2020   CO2 26 10/29/2020   GLUCOSE 101 (H) 10/29/2020   BUN 12 10/29/2020   CREATININE 0.89 10/29/2020   BILITOT 0.4 10/25/2020   ALKPHOS 69 04/02/2020   AST 19 10/25/2020   ALT 23 10/25/2020   PROT 6.5 10/25/2020   ALBUMIN 4.6 04/02/2020   CALCIUM 9.4 10/29/2020   ANIONGAP 8 10/29/2020   EGFR 73 10/25/2020  GFR 71.88 04/02/2020   Lab Results  Component Value Date   CHOL 226 (H) 08/03/2020   Lab Results  Component Value Date   HDL 46 (L) 08/03/2020   Lab Results  Component Value Date   LDLCALC 150 (H) 08/03/2020   Lab Results  Component Value Date   TRIG 164 (H)  08/03/2020   Lab Results  Component Value Date   CHOLHDL 4.9 08/03/2020   Lab Results  Component Value Date   HGBA1C 5.8 12/21/2017      Assessment & Plan:   Problem List Items Addressed This Visit       Unprioritized   Varicose veins of bilateral lower extremities with other complications - Primary  Patient has superficial thrombophlebitis left thigh noted on previous Dopplers with no evidence for recent DVT.  -She will continue with thigh-high compression stockings -Consider topical heat -She knows to stop frequently with upcoming travel at least every couple hours to get out and stretch and move around. -Continue anti-inflammatory medications as needed -We did discuss possible referral to vein specialist since she will discuss further with Dr. Sarajane Jews at follow-up  No orders of the defined types were placed in this encounter.   Follow-up: No follow-ups on file.    Carolann Littler, MD

## 2020-11-16 ENCOUNTER — Other Ambulatory Visit: Payer: Self-pay

## 2020-11-17 ENCOUNTER — Ambulatory Visit (INDEPENDENT_AMBULATORY_CARE_PROVIDER_SITE_OTHER): Payer: Medicare Other | Admitting: Family Medicine

## 2020-11-17 ENCOUNTER — Encounter: Payer: Self-pay | Admitting: Family Medicine

## 2020-11-17 VITALS — BP 128/82 | HR 89 | Temp 98.5°F | Ht 66.0 in | Wt 215.0 lb

## 2020-11-17 DIAGNOSIS — J40 Bronchitis, not specified as acute or chronic: Secondary | ICD-10-CM

## 2020-11-17 DIAGNOSIS — U071 COVID-19: Secondary | ICD-10-CM

## 2020-11-17 DIAGNOSIS — I7300 Raynaud's syndrome without gangrene: Secondary | ICD-10-CM | POA: Diagnosis not present

## 2020-11-17 DIAGNOSIS — I8002 Phlebitis and thrombophlebitis of superficial vessels of left lower extremity: Secondary | ICD-10-CM | POA: Diagnosis not present

## 2020-11-17 DIAGNOSIS — I83893 Varicose veins of bilateral lower extremities with other complications: Secondary | ICD-10-CM

## 2020-11-17 HISTORY — DX: Phlebitis and thrombophlebitis of superficial vessels of left lower extremity: I80.02

## 2020-11-17 MED ORDER — ASPIRIN EC 325 MG PO TBEC: 325.0000 mg | DELAYED_RELEASE_TABLET | Freq: Every day | ORAL | 0 refills | Status: DC

## 2020-11-17 NOTE — Progress Notes (Signed)
Subjective:    Patient ID: Megan Crane, female    DOB: 1950/05/30, 70 y.o.   MRN: [id number]  HPI Here to re-establish with [place] and to follow up on several issues. She had been seeing Dr. Emeterio Reeve at the [facility] primary care clinic iin [place] for a few months. First she tested positive for the Covid-19 virus on 10-08-20, and she was then treated with a Zpack and Dexamethasone on 10-12-20 for a bronchitis. She feels better from this but she still has a slight dry cough. No fever or SOB. Then she was seen in the ED on 10-29-20 for swelling and pain in the left thigh. A venous doppler revealed a superficial thrombophlebitis in the left greater saphenous vein, but there were no DVT's. She was given a round of Keflex for this, and she has been using compression wraps, elevation, and Ibuprofen. Her leg feels much better now with only slight tenderness. She thinks the thrombus was the result of being immobile in her home for a week while she had the Covid infection. Finally she asks about a problem in her hands that started about a year ago. She says her fingers and hands will suddenly turn white and her hands feel as if they were swollen and would "burst". There is no visible swelling. This often happens when her hands get cold. It lasts from 10 to 30 minutes and then resolves.    Review of Systems  Constitutional: Negative.   HENT: Negative.    Eyes: Negative.   Respiratory:  Positive for cough. Negative for shortness of breath and wheezing.   Cardiovascular:  Positive for leg swelling. Negative for chest pain and palpitations.  Skin:  Positive for color change.      Objective:   Physical Exam Constitutional:      Appearance: Normal appearance. She is not ill-appearing.  Cardiovascular:     Rate and Rhythm: Normal rate and regular rhythm.     Pulses: Normal pulses.     Heart sounds: Normal heart sounds.  Pulmonary:     Effort: Pulmonary effort is normal.     Breath sounds:  Normal breath sounds.  Musculoskeletal:     Right lower leg: No edema.     Left lower leg: No edema.     Comments: There are multiple varicosities in both legs, both large and small. The left medial thigh has several areas that are tender and there are several palpable cords under the skin. No erythema or warmth.   Lymphadenopathy:     Cervical: No cervical adenopathy.  Skin:    General: Skin is warm and dry.     Coloration: Skin is not pale.     Findings: No erythema.  Neurological:     Mental Status: She is alert.          Assessment & Plan:  First she has recovered from her recent Covid-19 infection and a secondary bronchitis. Second she is recovering from thrombophlebitis in the left thigh, and she will continue with elevation and rest. The thrombus will dissolve on its own over the next 4-8 weeks. To prevent future thrombi, she will start taking a 325 mg aspirin daily. Finally she has Raynauds phenomenon. I reassured her this is not dangerous but can be uncomfortable. She will try to keep her hands warm at all times. We discussed the possibility of changing one of her BP medications to a calcium channel blocker to help prevent this, but she wants to avoid  this if possible. We spent a total of (35   ) minutes reviewing records and discussing these issues.  Alysia Penna, MD

## 2020-11-30 ENCOUNTER — Ambulatory Visit: Payer: Medicare Other | Admitting: Family Medicine

## 2020-12-01 ENCOUNTER — Telehealth: Payer: Self-pay

## 2020-12-01 NOTE — Telephone Encounter (Signed)
Patient called asking if it is ok to get a Flu shot and to call her to let her know so she can schedule

## 2020-12-01 NOTE — Telephone Encounter (Signed)
Last office visit- 11/17/20.   Please advise

## 2020-12-07 ENCOUNTER — Telehealth: Payer: Self-pay

## 2020-12-07 NOTE — Telephone Encounter (Signed)
Spoke with pt verbalized understanding 

## 2020-12-07 NOTE — Telephone Encounter (Signed)
Please advise 

## 2020-12-07 NOTE — Telephone Encounter (Signed)
Patient called asking for names of Surgeons patient's husband needs gallbladder surgery and patient would like some recommendation from Dr. Sarajane Jews.  Call back # [phone]

## 2020-12-07 NOTE — Telephone Encounter (Signed)
Yes she should get a flu shot

## 2020-12-08 NOTE — Telephone Encounter (Signed)
Dr. Autumn Messing is very good

## 2020-12-09 NOTE — Telephone Encounter (Signed)
Spoke with patient about message.  

## 2020-12-10 ENCOUNTER — Ambulatory Visit: Payer: Medicare Other | Admitting: Family Medicine

## 2020-12-13 ENCOUNTER — Ambulatory Visit: Payer: Medicare Other

## 2020-12-14 ENCOUNTER — Emergency Department
Admission: EM | Admit: 2020-12-14 | Discharge: 2020-12-14 | Discharge status: Against Medical Advice | Payer: Medicare Other | Source: Home / Self Care | Attending: Family Medicine | Admitting: Family Medicine

## 2020-12-14 ENCOUNTER — Other Ambulatory Visit: Payer: Self-pay

## 2020-12-15 ENCOUNTER — Ambulatory Visit (INDEPENDENT_AMBULATORY_CARE_PROVIDER_SITE_OTHER): Payer: Medicare Other

## 2020-12-15 DIAGNOSIS — Z23 Encounter for immunization: Secondary | ICD-10-CM

## 2020-12-28 ENCOUNTER — Telehealth: Payer: Self-pay | Admitting: Family Medicine

## 2020-12-28 NOTE — Telephone Encounter (Signed)
Error/njr °

## 2020-12-28 NOTE — Telephone Encounter (Deleted)
Pt has history of blood clot pt has a little tenderness to her leg . Pt does not want to talk with access nurse and would like to know if dr fry would call her in cephalexin 500 mg to be on the safe side. Pt is flying out to [place] on 01-01-2021 and will be back on 01-07-2021. Pt

## 2020-12-31 ENCOUNTER — Ambulatory Visit (INDEPENDENT_AMBULATORY_CARE_PROVIDER_SITE_OTHER): Payer: Medicare Other | Admitting: Family Medicine

## 2020-12-31 ENCOUNTER — Encounter: Payer: Self-pay | Admitting: Family Medicine

## 2020-12-31 VITALS — BP 134/82 | HR 82 | Temp 98.7°F | Wt 214.0 lb

## 2020-12-31 DIAGNOSIS — L600 Ingrowing nail: Secondary | ICD-10-CM | POA: Diagnosis not present

## 2020-12-31 DIAGNOSIS — L989 Disorder of the skin and subcutaneous tissue, unspecified: Secondary | ICD-10-CM

## 2020-12-31 DIAGNOSIS — I83893 Varicose veins of bilateral lower extremities with other complications: Secondary | ICD-10-CM

## 2020-12-31 DIAGNOSIS — I8002 Phlebitis and thrombophlebitis of superficial vessels of left lower extremity: Secondary | ICD-10-CM | POA: Diagnosis not present

## 2020-12-31 NOTE — Progress Notes (Signed)
° °  Subjective:    Patient ID: Megan Crane, female    DOB: 1950-05-28, 70 y.o.   MRN: [id number]  HPI Here for several issues. First she has some spots on her arms and face she wants [place] to look at. They do not bother her and they are not changing. Also she wants [place] to check her legs. She has varicose veins in both legs, and she had a superficial thrombophlebitis in the left thigh a month ago. Venous dopplers confirmed no DVT. She will be leaving tomorrow morning on a plane flight to [facility]. She takes a 325 mg ASA daily. Also she has had pain in the left great toe for weeks, but now it has turned red.    Review of Systems  Constitutional: Negative.   Respiratory: Negative.    Cardiovascular: Negative.   Skin:  Positive for color change.      Objective:   Physical Exam Constitutional:      Appearance: Normal appearance. She is not ill-appearing.  Cardiovascular:     Rate and Rhythm: Normal rate and regular rhythm.     Pulses: Normal pulses.     Heart sounds: Normal heart sounds.  Pulmonary:     Effort: Pulmonary effort is normal.     Breath sounds: Normal breath sounds.  Musculoskeletal:     Comments: Both legs gave numerous varicosities, no cords are felt. No tenderness or swelling.   Skin:    Comments: There are scattered macular tan spots over the trunk, face, and forearms. There is a papular lesion on the right forehead which has papillae. The skin around the left great toenail is red but not tender. The toenail is ingrown.  Neurological:     Mental Status: She is alert.          Assessment & Plan:  She has leg varicose veins, but there is no evidence of a thrombus anywhere. She will stay on ASA. On the flight I asked her to walk  up and down the aisle once an hour. The lesions on her arms are just solar lentigos that are benign. The forehead lesion may be a wart, so we will refer her to Dermatology. She also has an ingrowm toenail, so we will refer her to Podiatry.We  spent a total of (33   ) minutes reviewing records and discussing these issues.  Alysia Penna, MD

## 2021-01-14 ENCOUNTER — Encounter: Payer: Self-pay | Admitting: Podiatry

## 2021-01-14 ENCOUNTER — Other Ambulatory Visit: Payer: Self-pay

## 2021-01-14 ENCOUNTER — Ambulatory Visit (INDEPENDENT_AMBULATORY_CARE_PROVIDER_SITE_OTHER): Payer: Medicare Other | Admitting: Podiatry

## 2021-01-14 ENCOUNTER — Ambulatory Visit: Payer: Self-pay | Admitting: Podiatry

## 2021-01-14 DIAGNOSIS — D689 Coagulation defect, unspecified: Secondary | ICD-10-CM | POA: Diagnosis not present

## 2021-01-14 DIAGNOSIS — B351 Tinea unguium: Secondary | ICD-10-CM

## 2021-01-14 DIAGNOSIS — B353 Tinea pedis: Secondary | ICD-10-CM | POA: Diagnosis not present

## 2021-01-14 DIAGNOSIS — M79675 Pain in left toe(s): Secondary | ICD-10-CM

## 2021-01-14 DIAGNOSIS — M79674 Pain in right toe(s): Secondary | ICD-10-CM

## 2021-01-14 DIAGNOSIS — I7300 Raynaud's syndrome without gangrene: Secondary | ICD-10-CM

## 2021-01-14 MED ORDER — KETOCONAZOLE 2 % EX CREA: 1.0000 "application " | TOPICAL_CREAM | Freq: Every day | CUTANEOUS | 2 refills | Status: DC

## 2021-01-14 NOTE — Progress Notes (Signed)
°  Subjective:  Patient ID: Megan Crane, female    DOB: 1950/09/18,   MRN: [id number]  Chief Complaint  Patient presents with   Nail Problem    Bil hallux- curving sideway- pt mentioned its been this way for years- concern with athletes foot as well, pt reports she now wants further evaluation on nails-     71 y.o. female presents for concern of bilateral great ingrown toenails. Relates redness and tenderness to borders of nails. Concerned for redness around the toes.  Relates left is worse than right. Does give some discomfort but no pain. History of blood clots and currently on blood thinner which puts her at risk for foot care. Does have a family history of RA as well.  Relates she is also concerned she may have some athletes foot. Denies any other pedal complaints. Denies n/v/f/c.   Past Medical History:  Diagnosis Date   Anxiety disorder    hx vasovagal responses   Headache(784.0)    History of kidney stones    Hydronephrosis, right    Hyperlipidemia    Hypertension    Nephrolithiasis    Positive nasal culture for methicillin resistant Staphylococcus aureus    Right ureteral stone    Urinary incontinence    sees Dr. McDiarmid     Objective:  Physical Exam: Vascular: DP/PT pulses 2/4 bilateral. CFT <3 seconds. Normal hair growth on digits. No edema. Erythema noted to distal digits 1-5 bilateral. Noted that fingers also turning white and red throughout visit.  Skin. No lacerations or abrasions bilateral feet. Nails 1-5 are thickened discolored and elongated with subungual debris.   Musculoskeletal: MMT 5/5 bilateral lower extremities in DF, PF, Inversion and Eversion. Deceased ROM in DF of ankle joint. Minimal tenderness to nails 1-5 bilateral.  Neurological: Sensation intact to light touch.   Assessment:   1. Pain due to onychomycosis of toenails of both feet   2. Raynaud's phenomenon without gangrene   3. Coagulation defect ([place])      Plan:  Patient was evaluated and  treated and all questions answered. -Discussed and educated patient on  foot care, especially with  regards to the vascular, neurological and musculoskeletal systems.  -Discussed supportive shoes at all times and checking feet regularly.  -Mechanically debrided all nails 1-5 bilateral using sterile nail nipper and small incident to medial right hallux covered with neosporin and bandaid.  -Prescription for ketoconazole provided,  -Discussed raynauds and causing redness in toes as well as concern for pain in multiple joints in feet. Discussed following up for rheumatologist for work-up.  -Answered all patient questions -Patient to return  in 3 months for at risk foot care -Patient advised to call the office if any problems or questions arise in the meantime.   Lorenda Peck, DPM

## 2021-01-19 ENCOUNTER — Encounter: Payer: Self-pay | Admitting: Osteopathic Medicine

## 2021-01-19 DIAGNOSIS — Z1321 Encounter for screening for nutritional disorder: Secondary | ICD-10-CM | POA: Diagnosis not present

## 2021-01-19 DIAGNOSIS — Z1231 Encounter for screening mammogram for malignant neoplasm of breast: Secondary | ICD-10-CM | POA: Diagnosis not present

## 2021-01-19 LAB — HM MAMMOGRAPHY

## 2021-01-26 ENCOUNTER — Encounter: Payer: Self-pay | Admitting: Family Medicine

## 2021-01-28 ENCOUNTER — Encounter: Payer: Self-pay | Admitting: Gastroenterology

## 2021-02-01 ENCOUNTER — Encounter: Payer: Self-pay | Admitting: Family Medicine

## 2021-02-17 DIAGNOSIS — H47323 Drusen of optic disc, bilateral: Secondary | ICD-10-CM | POA: Diagnosis not present

## 2021-02-17 DIAGNOSIS — H5203 Hypermetropia, bilateral: Secondary | ICD-10-CM | POA: Diagnosis not present

## 2021-02-17 DIAGNOSIS — H2513 Age-related nuclear cataract, bilateral: Secondary | ICD-10-CM | POA: Diagnosis not present

## 2021-02-17 DIAGNOSIS — H524 Presbyopia: Secondary | ICD-10-CM | POA: Diagnosis not present

## 2021-03-03 ENCOUNTER — Other Ambulatory Visit: Payer: Self-pay

## 2021-03-03 ENCOUNTER — Ambulatory Visit (AMBULATORY_SURGERY_CENTER): Payer: Medicare Other | Admitting: *Deleted

## 2021-03-03 VITALS — Ht 66.0 in | Wt 216.0 lb

## 2021-03-03 DIAGNOSIS — Z800 Family history of malignant neoplasm of digestive organs: Secondary | ICD-10-CM

## 2021-03-03 DIAGNOSIS — Z86010 Personal history of colonic polyps: Secondary | ICD-10-CM

## 2021-03-03 NOTE — Progress Notes (Signed)
Patient's pre-visit was done today over the phone with the patient. Name,DOB and address verified. Patient denies any allergies to Eggs and Soy. Patient denies any problems with anesthesia/sedation. Patient is not taking any diet pills or blood thinners. No home Oxygen.  The patient is COVID-19 vaccinated.    During PV patient states she is having dyspnea once a week-I encouraged patient to follow up with her PCP before colonoscopy. Patient will get clearance then she will call us back to make this appointment. I did let the patient know she was overdue for this colonoscopy-patient denies any GI issues.

## 2021-03-04 ENCOUNTER — Telehealth: Payer: Self-pay | Admitting: Gastroenterology

## 2021-03-04 ENCOUNTER — Ambulatory Visit (INDEPENDENT_AMBULATORY_CARE_PROVIDER_SITE_OTHER): Payer: Medicare Other | Admitting: Family Medicine

## 2021-03-04 ENCOUNTER — Encounter: Payer: Self-pay | Admitting: Family Medicine

## 2021-03-04 VITALS — BP 120/80 | HR 79 | Temp 98.4°F | Ht 66.0 in | Wt 212.6 lb

## 2021-03-04 DIAGNOSIS — I10 Essential (primary) hypertension: Secondary | ICD-10-CM | POA: Diagnosis not present

## 2021-03-04 DIAGNOSIS — Z86010 Personal history of colonic polyps: Secondary | ICD-10-CM

## 2021-03-04 DIAGNOSIS — Z1211 Encounter for screening for malignant neoplasm of colon: Secondary | ICD-10-CM | POA: Diagnosis not present

## 2021-03-04 DIAGNOSIS — Z800 Family history of malignant neoplasm of digestive organs: Secondary | ICD-10-CM

## 2021-03-04 MED ORDER — NA SULFATE-K SULFATE-MG SULF 17.5-3.13-1.6 GM/177ML PO SOLN: 1.0000 | ORAL | 0 refills | Status: DC

## 2021-03-04 NOTE — Progress Notes (Signed)
° °  Subjective:    Patient ID: Megan Crane, female    DOB: December 28, 1950, 71 y.o.   MRN: [id number]  HPI Here for clearance to undergo a colonoscopy. She is preparing to have a colonoscopy on March 9,  and she had a telephone evaluation this week with a nurse prior to the procedure. Carson said something about "catching her breath" and the nurse requested that we evaluate her before undergoing the anesthesia. Today Astrid says she feels fine. She never has actual shortness of breath, but she describes an occasional episode where she feels the need to take a deep breath. After a single breath in like this, she feels fine. She denies any chest pressure or pain. These episodes always occur at rest, never during exertion. No cough or wheezing. Of note, she had a Myoview stress test in 2017 that was normal. She deals with anxiety on a daily basis, and we have talked about this for years.    Review of Systems  Constitutional: Negative.   Respiratory:  Positive for shortness of breath. Negative for cough, choking, chest tightness and wheezing.   Cardiovascular:  Positive for leg swelling. Negative for chest pain and palpitations.      Objective:   Physical Exam Constitutional:      Appearance: Normal appearance.  Cardiovascular:     Rate and Rhythm: Normal rate and regular rhythm.     Pulses: Normal pulses.     Heart sounds: Normal heart sounds.  Pulmonary:     Effort: Pulmonary effort is normal. No respiratory distress.     Breath sounds: No stridor. No wheezing, rhonchi or rales.  Musculoskeletal:     Right lower leg: No edema.     Left lower leg: No edema.  Neurological:     Mental Status: She is alert.          Assessment & Plan:  I think these episodes are simply due to anxiety, and they are of no medical concern. Her HTN is well controlled. She is therefore CLEARED to have a colonoscopy as scheduled.  Alysia Penna, MD

## 2021-03-04 NOTE — Telephone Encounter (Signed)
Patient had previsit yesterday. Everything was cancelled because of possible breathing issue.  However, patient went to see Dr. Sarajane Jews today and is telling her that is nothing to worry about as far as having a procedure.  He is supposed to be forwarding something to [place] to that effect.  However, patient would like to know if she can still have her procedure as scheduled.  Please call patient and advise.  Thank you.

## 2021-03-04 NOTE — Telephone Encounter (Signed)
Noted Dr.Fry's office visit notes-pt is cleared for colonoscopy.   New appointment made with patient and new prep instructions went over with pt and mailed to pt. Suprep Rx sent to pharmacy-pt is aware.

## 2021-03-15 ENCOUNTER — Ambulatory Visit: Payer: Medicare Other | Admitting: Family Medicine

## 2021-03-17 ENCOUNTER — Encounter: Payer: Medicare Other | Admitting: Gastroenterology

## 2021-03-24 ENCOUNTER — Telehealth: Payer: Self-pay

## 2021-03-24 NOTE — Telephone Encounter (Signed)
Patient called asking if Rx ca be sent to the pharmacy for condition Dr. Sarajane Jews treated last year patient stated she is having some discomfort in lower Gi tract ?

## 2021-03-25 ENCOUNTER — Other Ambulatory Visit: Payer: Self-pay

## 2021-03-25 ENCOUNTER — Encounter (HOSPITAL_BASED_OUTPATIENT_CLINIC_OR_DEPARTMENT_OTHER): Payer: Self-pay | Admitting: *Deleted

## 2021-03-25 ENCOUNTER — Emergency Department (HOSPITAL_BASED_OUTPATIENT_CLINIC_OR_DEPARTMENT_OTHER)
Admission: EM | Admit: 2021-03-25 | Discharge: 2021-03-25 | Disposition: A | Discharge status: Home / Self Care | Payer: Medicare Other | Attending: Emergency Medicine | Admitting: Emergency Medicine

## 2021-03-25 ENCOUNTER — Telehealth: Payer: Self-pay | Admitting: Family Medicine

## 2021-03-25 DIAGNOSIS — I10 Essential (primary) hypertension: Secondary | ICD-10-CM | POA: Diagnosis not present

## 2021-03-25 DIAGNOSIS — Z7982 Long term (current) use of aspirin: Secondary | ICD-10-CM | POA: Insufficient documentation

## 2021-03-25 DIAGNOSIS — R197 Diarrhea, unspecified: Secondary | ICD-10-CM

## 2021-03-25 DIAGNOSIS — Z20822 Contact with and (suspected) exposure to covid-19: Secondary | ICD-10-CM | POA: Diagnosis not present

## 2021-03-25 DIAGNOSIS — B349 Viral infection, unspecified: Secondary | ICD-10-CM | POA: Diagnosis not present

## 2021-03-25 LAB — COMPREHENSIVE METABOLIC PANEL
ALT: 23 U/L (ref 0–44)
AST: 24 U/L (ref 15–41)
Albumin: 3.6 g/dL (ref 3.5–5.0)
Alkaline Phosphatase: 61 U/L (ref 38–126)
Anion gap: 4 — ABNORMAL LOW (ref 5–15)
BUN: 16 mg/dL (ref 8–23)
CO2: 29 mmol/L (ref 22–32)
Calcium: 9.5 mg/dL (ref 8.9–10.3)
Chloride: 101 mmol/L (ref 98–111)
Creatinine, Ser: 0.79 mg/dL (ref 0.44–1.00)
GFR, Estimated: >60 mL/min (ref >60)
Glucose, Bld: 92 mg/dL (ref 70–99)
Potassium: 4.2 mmol/L (ref 3.5–5.1)
Sodium: 134 mmol/L — ABNORMAL LOW (ref 135–145)
Total Bilirubin: 0.7 mg/dL (ref 0.3–1.2)
Total Protein: 6.7 g/dL (ref 6.5–8.1)

## 2021-03-25 LAB — CBC
HCT: 39.5 % (ref 36.0–46.0)
Hemoglobin: 13.2 g/dL (ref 12.0–15.0)
MCH: 29.8 pg (ref 26.0–34.0)
MCHC: 33.4 g/dL (ref 30.0–36.0)
MCV: 89.2 fL (ref 80.0–100.0)
Platelets: 205 10*3/uL (ref 150–400)
RBC: 4.43 MIL/uL (ref 3.87–5.11)
RDW: 13.7 % (ref 11.5–15.5)
WBC: 6.7 10*3/uL (ref 4.0–10.5)
nRBC: 0 % (ref 0.0–0.2)

## 2021-03-25 LAB — RESP PANEL BY RT-PCR (FLU A&B, COVID) ARPGX2
Influenza A by PCR: NEGATIVE
Influenza B by PCR: NEGATIVE
SARS Coronavirus 2 by RT PCR: NEGATIVE

## 2021-03-25 LAB — URINALYSIS, ROUTINE W REFLEX MICROSCOPIC
Bilirubin Urine: NEGATIVE
Glucose, UA: NEGATIVE mg/dL
Hgb urine dipstick: NEGATIVE
Ketones, ur: NEGATIVE mg/dL
Nitrite: NEGATIVE
Protein, ur: NEGATIVE mg/dL
Specific Gravity, Urine: 1.02 (ref 1.005–1.030)
pH: 5.5 (ref 5.0–8.0)

## 2021-03-25 LAB — URINALYSIS, MICROSCOPIC (REFLEX)

## 2021-03-25 LAB — LIPASE, BLOOD: Lipase: 29 U/L (ref 11–51)

## 2021-03-25 MED ORDER — SODIUM CHLORIDE 0.9 % IV BOLUS
1000.0000 mL | Freq: Once | INTRAVENOUS | Status: AC
Start: 2021-03-25 — End: 2021-03-25
  Administered 2021-03-25: 1000 mL via INTRAVENOUS

## 2021-03-25 MED ORDER — OMEPRAZOLE 40 MG PO CPDR: 40.0000 mg | DELAYED_RELEASE_CAPSULE | Freq: Every day | ORAL | 5 refills | Status: DC

## 2021-03-25 NOTE — Telephone Encounter (Signed)
Called patient , message given.  Patient said that she will wake her husband up and head over to Urgent Care.  ?

## 2021-03-25 NOTE — Telephone Encounter (Signed)
Omeprazole sent to pt pharmacy per Dr Sarajane Jews, pt notified verbalized undewrstanding ?

## 2021-03-25 NOTE — Discharge Instructions (Signed)
Your workup was overall reassuring in the ED today without any abnormalities.  ?I would recommend continuing to stay hydrated by drinking plenty of fluids and resting as much as possible.  ?Take Imodium as needed.  ? ?Follow up with your PCP for further eval ? ?Return to the ED for any new/worsening symptoms including vomiting, recurrent/more severe abdominal pain, fevers > 100.4, or any other new/concerning symptoms  ?

## 2021-03-25 NOTE — ED Notes (Signed)
Report given to [facility] for continuation of care.  ?

## 2021-03-25 NOTE — Telephone Encounter (Signed)
Pt is calling checking on the previous message ?

## 2021-03-25 NOTE — ED Triage Notes (Signed)
Diarrhea for a week. Abdominal cramps. she took an OTC antidiarrhea medication. She has been drinking Pedialyte. No fever. She feels like her lymph glands are swollen.  ?

## 2021-03-25 NOTE — Telephone Encounter (Signed)
Patient called in stating that she came down with a possible bug Monday. Patient stated that it has gotten worse and was wondering if Dr.Fry could call her in something. She is experiencing cramps in her lower abdomen. Patient said that she is not running a fever and is currently taking anti-diarrhea medication that her husband picked up at [facility]. ? ?Patient is also requesting [name] to give her a phone call. ? ?Patient could be contacted at [phone] first and [phone] if she doesn't answer first number. ?

## 2021-03-25 NOTE — ED Notes (Signed)
Pt ambulatory with steady gait to restroom. Will attempt to provide stool specimen ?

## 2021-03-25 NOTE — Telephone Encounter (Signed)
See the other note for today ?

## 2021-03-25 NOTE — Telephone Encounter (Signed)
Tell her to go to urgent care tonight. This could be a bacterial infection that needs to be treated asap   ?

## 2021-03-25 NOTE — ED Provider Notes (Signed)
?[facility] EMERGENCY DEPARTMENT ?Provider Note ? ? ?CSN: [id number] ?Arrival date & time: 03/25/21  1728 ? ?  ? ?History ? ?Chief Complaint  ?Patient presents with  ? Diarrhea  ? ? ?Megan Crane is a 71 y.o. female with PMHx HTN, HLD who presents to the ED today with complaint of gradual onset, constant, watery diarrhea x 1 week.  Patient also complains of mild lower abdominal pain, sore throat, headache.  She states that she feels like the right side of her neck has a mildly swollen lymph node.  Mentions to me that she was recently at her friend's house and held her friends dying dog in her arms and states that the dog breathed into her mouth she is concerned she could have picked up a bacteria from him.  She reports that she called her friend and reports her friend had also been sick.  Denies any other sick contacts.  States she called her PCP who advised she come to the ED for further evaluation. Denies any recent abx use.  ? ?The history is provided by the patient, medical records and the spouse.  ? ?  ? ?Home Medications ?Prior to Admission medications   ?Medication Sig Start Date End Date Taking? Authorizing Provider  ?ALPRAZolam (XANAX) 0.25 MG tablet Take 1-2 tablets (0.25-0.5 mg total) by mouth 2 (two) times daily as needed for anxiety. 09/15/20   Emeterio Reeve, DO  ?aspirin EC 81 MG tablet Take 81 mg by mouth daily. Swallow whole.    [provider]  ?hydrochlorothiazide (HYDRODIURIL) 25 MG tablet Take 1 tablet (25 mg total) by mouth daily. 08/02/20   Emeterio Reeve, DO  ?ketoconazole (NIZORAL) 2 % cream Apply 1 application topically daily. 01/14/21   Lorenda Peck, MD  ?Multiple Vitamins-Minerals (ONE-A-DAY WOMENS PO) Take by mouth.    [provider]  ?Na Sulfate-K Sulfate-Mg Sulf 17.5-3.13-1.6 GM/177ML SOLN Take 1 kit by mouth as directed. May use generic SUPREP; NO prior authorizations will be done. USE SINGLECARE 03/04/21   Yetta Flock, MD  ?omeprazole  (PRILOSEC) 40 MG capsule Take 1 capsule (40 mg total) by mouth daily. 03/25/21   Laurey Morale, MD  ?valsartan (DIOVAN) 40 MG tablet Take 1 tablet (40 mg total) by mouth daily. 08/02/20   Emeterio Reeve, DO  ?vitamin C (ASCORBIC ACID) 500 MG tablet Take 500 mg by mouth once a week.    [provider]  ?   ? ?Allergies    ?Lidocaine, Escitalopram, Levofloxacin, and Oxycodone-aspirin   ? ?Review of Systems   ?Review of Systems  ?Constitutional:  Negative for chills and fever.  ?HENT:  Positive for sore throat. Negative for trouble swallowing.   ?Respiratory:  Negative for cough and shortness of breath.   ?Gastrointestinal:  Positive for abdominal pain and diarrhea. Negative for nausea and vomiting.  ?Neurological:  Positive for headaches.  ?All other systems reviewed and are negative. ? ?Physical Exam ?Updated Vital Signs ?BP (!) 96/59   Pulse 75   Temp 98.7 ?F (37.1 ?C)   Resp 18   Ht '5\' 6"'  (1.676 m)   Wt 96.4 kg   SpO2 99%   BMI 34.30 kg/m?  ?Physical Exam ?Vitals and nursing note reviewed.  ?Constitutional:   ?   Appearance: She is not ill-appearing or diaphoretic.  ?HENT:  ?   Head: Normocephalic and atraumatic.  ?   Mouth/Throat:  ?   Mouth: Mucous membranes are moist.  ?   Pharynx: Posterior oropharyngeal  erythema present. No oropharyngeal exudate.  ?Eyes:  ?   Conjunctiva/sclera: Conjunctivae normal.  ?Cardiovascular:  ?   Rate and Rhythm: Normal rate and regular rhythm.  ?   Pulses: Normal pulses.  ?Pulmonary:  ?   Effort: Pulmonary effort is normal.  ?   Breath sounds: Normal breath sounds. No wheezing, rhonchi or rales.  ?Abdominal:  ?   Palpations: Abdomen is soft.  ?   Tenderness: There is no abdominal tenderness. There is no guarding or rebound.  ?Musculoskeletal:  ?   Cervical back: Neck supple.  ?Skin: ?   General: Skin is warm and dry.  ?Neurological:  ?   Mental Status: She is alert.  ? ? ?ED Results / Procedures / Treatments   ?Labs ?(all labs ordered are listed, but only abnormal  results are displayed) ?Labs Reviewed  ?COMPREHENSIVE METABOLIC PANEL - Abnormal; Notable for the following components:  ?    Result Value  ? Sodium 134 (*)   ? Anion gap 4 (*)   ? All other components within normal limits  ?URINALYSIS, ROUTINE W REFLEX MICROSCOPIC - Abnormal; Notable for the following components:  ? Leukocytes,Ua TRACE (*)   ? All other components within normal limits  ?URINALYSIS, MICROSCOPIC (REFLEX) - Abnormal; Notable for the following components:  ? Bacteria, UA RARE (*)   ? All other components within normal limits  ?RESP PANEL BY RT-PCR (FLU A&B, COVID) ARPGX2  ?LIPASE, BLOOD  ?CBC  ? ? ?EKG ?None ? ?Radiology ?No results found. ? ?Procedures ?Procedures  ? ? ?Medications Ordered in ED ?Medications  ?sodium chloride 0.9 % bolus 1,000 mL ( Intravenous Restarted 03/25/21 1838)  ? ? ?ED Course/ Medical Decision Making/ A&P ?  ?                        ?Medical Decision Making ?71 year old female who presents to the ED today with complaint of diarrhea x1 week with associated mild sore throat, headache, aminal pain.  On arrival to the ED vitals are stable.  Patient is afebrile, nontachycardic and nontachypneic and appears to be no acute distress.  Some mild posterior oropharyngeal erythema, no edema or exudate.  Phonating normally and tolerating her own secretions without difficulty.  Abdomen soft and nontender.  Lungs clear to auscultation bilaterally.  She mentions concern regarding possibility of bacterial infection secondary to being in close contact with a dying dog last week.  More suspicious for viral etiology at this time including possibility for COVID-19 infection.  We will plan for lab work to assess for electrolyte derangements due to amount of diarrhea.  We will plan for fluid resuscitation.  We will add on COVID testing at this time.  I do not feel patient requires CT imaging at this time as her abdomen is soft and nontender however if lab work significantly abnormal may consider  same.  Question possibility of diverticulitis given amount of diarrhea and lower abdominal pain earlier this week.  Otherwise lower suspicion for acute intra-abdominal infection.  She denies any recent antibiotic use to suggest C. difficile infection.  ? ?Work-up overall reassuring.  Lab work unremarkable at this time.  Patient reports mild improvement after fluids.  I do not feel she requires additional work-up at this time.  Her abdomen continues to be nontender; I do not feel she requires CT scan at this time.  Have recommended following up with PCP next week for further eval.  Strict return precautions have been discussed  with patient.  She is in agreement with plan at this time and stable for discharge. ? ?Problems Addressed: ?Diarrhea, unspecified type: acute illness or injury ?Viral illness: acute illness or injury ? ?Amount and/or Complexity of Data Reviewed ?Labs: ordered. ?   Details: CBC without leukocytosis. Hgb stable at 13.2 ?CMP with sodium 134 - pt receiving fluids. No other electrolyte abnormalities. LFTs unremarkable.  ?Lipase WNL at 29 ?U/A with trace leuks, rare bacteria, and 6-10 squamous epithelial cells. Pt without urinary symptoms; suspect contamination.  ?COVID and flu negative ? ? ? ? ? ? ? ? ? ?Final Clinical Impression(s) / ED Diagnoses ?Final diagnoses:  ?Diarrhea, unspecified type  ?Viral illness  ? ? ?Rx / DC Orders ?ED Discharge Orders   ? ? None  ? ?  ? ? ? ?Discharge Instructions   ? ?  ?Your workup was overall reassuring in the ED today without any abnormalities.  ?I would recommend continuing to stay hydrated by drinking plenty of fluids and resting as much as possible.  ?Take Imodium as needed.  ? ?Follow up with your PCP for further eval ? ?Return to the ED for any new/worsening symptoms including vomiting, recurrent/more severe abdominal pain, fevers > 100.4, or any other new/concerning symptoms  ? ? ? ? ? ?  ?Eustaquio Maize, PA-C ?03/25/21 2023 ? ?  ?Lianne Cure,  DO ?18/29/93 2325 ? ?

## 2021-03-25 NOTE — Telephone Encounter (Signed)
Please see previous message

## 2021-03-25 NOTE — ED Notes (Signed)
Pt ambulatory with steady gait to restroom 

## 2021-03-25 NOTE — Telephone Encounter (Signed)
Pt states that she has had diarrhea since Monday, state that she has been having lower abdominal cramps, yellowish/gold bowels that are slimy and headaches. Pt states that she has been hydrating and taking Imodium which has helped. Pt denies nausea /vomiting, pt wants to know if she is fine to continue taking Imodium or should she go to Urgent care. Please advise ?

## 2021-03-25 NOTE — Telephone Encounter (Signed)
Patient has taken Omeprazole '40mg'$  in past.    See 05/17/2020 office note.  ?

## 2021-03-25 NOTE — Telephone Encounter (Signed)
Call in Omeprazole 40 mg every morning #30 with 5 rf. If she is not better by next week, come see [place]  ?

## 2021-03-29 ENCOUNTER — Telehealth: Payer: Self-pay | Admitting: Family Medicine

## 2021-03-29 NOTE — Telephone Encounter (Signed)
Pt call and stated she need you to call her back she need to talk about the diarrhea and the honey she have been using pt stated you can call her on both #. ?

## 2021-03-29 NOTE — Telephone Encounter (Signed)
Called and spoke with patient.    Patient went to Urgent care on Friday 03/25/2021.  Sample came back with no bacteria, afterward her diarrhea had gotten somewhat better.    On Yesterday patient ate green beans, rice, and pot roast had small amount of diarrhea afterward.    Later patient ate toast with raw honey, then the diarrhea started again, she had 2 to 3 loose bowel movements.   No cramping, no fever, no vomiting, but she is still having loose stools.    Colonoscopy is scheduled for 04/26/21.   At this time patient said she is a little hungry so she is going to eat toast no honey , rice and maybe some chicken noodle soup.      Please advise patient on what her diet should be.     ?

## 2021-03-30 NOTE — Telephone Encounter (Signed)
Called and spoke with patient.   Message given.   Voiced understanding.   Will contact office back if still continuing to experiencing problems. ?

## 2021-03-30 NOTE — Telephone Encounter (Signed)
I would avoid honey, anything fatty or fried, and anything spicy  ?

## 2021-03-31 DIAGNOSIS — H47323 Drusen of optic disc, bilateral: Secondary | ICD-10-CM | POA: Diagnosis not present

## 2021-04-08 ENCOUNTER — Telehealth: Payer: Self-pay | Admitting: Gastroenterology

## 2021-04-08 NOTE — Telephone Encounter (Signed)
Patient called requesting to speak with a nurse. Per patient, she's been feeling sick and had questions regarding colonoscopy scheduled for 04/26/21. Please advise.  ?

## 2021-04-08 NOTE — Telephone Encounter (Signed)
Pt returned call. She states that she was sick a few weeks ago with diarrhea, she felt like she had a stomach virus. Her PCP sent her to the ED for fluids and lab work. Pt states that since her stools are becoming more formed but are light in color. Pt expressed that she ate peaches and pizza yesterday and that seemed to bother her stomach some. Pt reports that her PCP told her to avoid any spicy, greasy, acidic foods. I told pt since her symptoms seem to be improving I would recommend that she try a bland diet for the the next few days and slowly advance as tolerated. I told pt to call back next week with an update on symptoms. If no improvement I told her we will get a message to Dr. Havery Moros and see how he would like to proceed prior to her colonoscopy. I reviewed in detail items that pt may eat, she is drinking Pedialyte and water as well. Pt knows to avoid coffee, acidic, spicy, fatty, fried, greasy, dairy, etc. Pt verbalized understanding of all information and had no concerns at the end of the call.  ?

## 2021-04-08 NOTE — Telephone Encounter (Signed)
Lm on vm for patient to return call 

## 2021-04-14 ENCOUNTER — Ambulatory Visit (INDEPENDENT_AMBULATORY_CARE_PROVIDER_SITE_OTHER): Payer: Medicare Other | Admitting: Podiatry

## 2021-04-14 ENCOUNTER — Encounter: Payer: Self-pay | Admitting: Podiatry

## 2021-04-14 DIAGNOSIS — B351 Tinea unguium: Secondary | ICD-10-CM

## 2021-04-14 DIAGNOSIS — M79675 Pain in left toe(s): Secondary | ICD-10-CM

## 2021-04-14 DIAGNOSIS — I7300 Raynaud's syndrome without gangrene: Secondary | ICD-10-CM

## 2021-04-14 DIAGNOSIS — M79674 Pain in right toe(s): Secondary | ICD-10-CM | POA: Diagnosis not present

## 2021-04-14 DIAGNOSIS — D689 Coagulation defect, unspecified: Secondary | ICD-10-CM

## 2021-04-14 NOTE — Progress Notes (Signed)
?  Subjective:  ?Patient ID: Megan Crane, female    DOB: 18-Oct-1950,   MRN: [id number] ? ?No chief complaint on file. ? ? ?71 y.o. female presents for concern of bilateral great ingrown toenails. Relates redness and tenderness to borders of nails. History of blood clots and currently on blood thinner which puts her at risk for foot care. Does have a family history of RA as well.  Relates she is also concerned she may have some athletes foot. Denies any other pedal complaints. Denies n/v/f/c.  ? ?Past Medical History:  ?Diagnosis Date  ? Anxiety disorder   ? hx vasovagal responses  ? Headache(784.0)   ? History of kidney stones   ? Hydronephrosis, right   ? Hyperlipidemia   ? Hypertension   ? Nephrolithiasis   ? Positive nasal culture for methicillin resistant Staphylococcus aureus   ? Right ureteral stone   ? Urinary incontinence   ? sees Dr. McDiarmid   ? ? ?Objective:  ?Physical Exam: ?Vascular: DP/PT pulses 2/4 bilateral. CFT <3 seconds. Normal hair growth on digits. No edema. Erythema noted to distal digits 1-5 bilateral. Noted that fingers also turning white and red throughout visit.  ?Skin. No lacerations or abrasions bilateral feet. Nails 1-5 are thickened discolored and elongated with subungual debris.   ?Musculoskeletal: MMT 5/5 bilateral lower extremities in DF, PF, Inversion and Eversion. Deceased ROM in DF of ankle joint. Minimal tenderness to nails 1-5 bilateral.  ?Neurological: Sensation intact to light touch.  ? ?Assessment:  ? ?1. Coagulation defect ([place])   ?2. Pain due to onychomycosis of toenails of both feet   ?3. Raynaud's phenomenon without gangrene   ? ? ? ? ?Plan:  ?Patient was evaluated and treated and all questions answered. ?-Discussed and educated patient on  foot care, especially with  ?regards to the vascular, neurological and musculoskeletal systems.  ?-Discussed supportive shoes at all times and checking feet regularly.  ?-Mechanically debrided all nails 1-5 bilateral using sterile  nail nipper and small incident to medial right hallux covered with neosporin and bandaid.  ?-Continue ketoconazole.  ?-Discussed raynauds and causing redness in toes as well as concern for pain in multiple joints in feet.  ?-Answered all patient questions ?-Patient to return  in 3 months for at risk foot care ?-Patient advised to call the office if any problems or questions arise in the meantime. ? ? ?Lorenda Peck, DPM  ? ? ?

## 2021-04-20 ENCOUNTER — Telehealth: Payer: Self-pay | Admitting: Gastroenterology

## 2021-04-20 NOTE — Telephone Encounter (Signed)
Received call from patient regarding upcoming procedure. Per patient, she has questions about her medications/ when she can take them. Please advise. ?

## 2021-04-20 NOTE — Telephone Encounter (Signed)
Went over prep instructions with the patient. Answered all questions from pt at this time. Patient states she is doing better since ED visit for diarrhea. Bowels are back to normal per pt. ?

## 2021-04-20 NOTE — Telephone Encounter (Signed)
Called pt- no answer- Mail box full, cannot LM  ?

## 2021-04-26 ENCOUNTER — Ambulatory Visit (AMBULATORY_SURGERY_CENTER): Payer: Medicare Other | Admitting: Gastroenterology

## 2021-04-26 ENCOUNTER — Encounter: Payer: Self-pay | Admitting: Gastroenterology

## 2021-04-26 VITALS — BP 116/73 | HR 79 | Temp 96.2°F | Resp 17 | Ht 66.0 in | Wt 216.0 lb

## 2021-04-26 DIAGNOSIS — K639 Disease of intestine, unspecified: Secondary | ICD-10-CM

## 2021-04-26 DIAGNOSIS — F419 Anxiety disorder, unspecified: Secondary | ICD-10-CM | POA: Diagnosis not present

## 2021-04-26 DIAGNOSIS — K529 Noninfective gastroenteritis and colitis, unspecified: Secondary | ICD-10-CM | POA: Diagnosis not present

## 2021-04-26 DIAGNOSIS — Z86010 Personal history of colonic polyps: Secondary | ICD-10-CM

## 2021-04-26 DIAGNOSIS — I10 Essential (primary) hypertension: Secondary | ICD-10-CM | POA: Diagnosis not present

## 2021-04-26 DIAGNOSIS — E785 Hyperlipidemia, unspecified: Secondary | ICD-10-CM | POA: Diagnosis not present

## 2021-04-26 HISTORY — PX: COLONOSCOPY: SHX174

## 2021-04-26 MED ORDER — SODIUM CHLORIDE 0.9 % IV SOLN: 500.0000 mL | Freq: Once | INTRAVENOUS | Status: DC

## 2021-04-26 NOTE — Progress Notes (Signed)
[facility] History and Physical ? ? ?Primary Care Physician:  Laurey Morale, MD ? ? ?Reason for Procedure:   History of colon polyps, FH of CRC ? ?Plan:    colonoscopy ? ? ? ? ?HPI: Megan Crane is a 71 y.o. female  here for colonoscopy surveillance - SSP removed 12/2015, Mother had CRC age 20s. . Patient denies any bowel symptoms at this time. Otherwise feels well without any cardiopulmonary symptoms. She wishes to proceed after discussion of risks / benefits. ? ? ?Past Medical History:  ?Diagnosis Date  ? Anxiety disorder   ? hx vasovagal responses  ? Headache(784.0)   ? History of kidney stones   ? Hydronephrosis, right   ? Hyperlipidemia   ? Hypertension   ? Nephrolithiasis   ? Positive nasal culture for methicillin resistant Staphylococcus aureus   ? Right ureteral stone   ? Urinary incontinence   ? sees Dr. McDiarmid   ? ? ?Past Surgical History:  ?Procedure Laterality Date  ? COLONOSCOPY  12/28/2015  ? per Dr. Havery Moros, serrated polyps and diverticula, repeat in 3 yrs   ? CYSTOSCOPY  06/09/2011  ? Procedure: CYSTOSCOPY;  Surgeon: Reece Packer, MD;  Location: [facility];  Service: Urology;  Laterality: N/A;  ? CYSTOSCOPY W/ URETERAL STENT PLACEMENT  02/03/2014  ? Procedure: CYSTOSCOPY WITH  RIGHT RETROGRADE PYELOGRAM/ RIGHT URETERAL STENT PLACEMENT;  Surgeon: Malka So, MD;  Location: [facility];  Service: Urology;;  ? CYSTOSCOPY WITH BIOPSY  02/03/2014  ? Procedure: CYSTOSCOPY WITH BIOPSY;  Surgeon: Malka So, MD;  Location: [facility];  Service: Urology;;  ? [place]  ? RECTOCELE REPAIR  06/09/2011  ? Procedure: POSTERIOR REPAIR (RECTOCELE);  Surgeon: Reece Packer, MD;  Location: [facility];  Service: Urology;  Laterality: N/A;  Xenform graft 6x10  ? RIGHT URETEROSCOPIC STONE EXTRACTION / STENT PLACEMENT  03-13-2000  ? URETEROSCOPY Right 02/03/2014  ? Procedure: URETEROSCOPY WITH MANAGEMENT OF URETERAL STRICTURE;  Surgeon: Malka So, MD;  Location: [facility];  Service: Urology;  Laterality: Right;  ? VAGINAL HYSTERECTOMY  06/09/2011  ? Procedure: HYSTERECTOMY VAGINAL;  Surgeon: Sharene Butters, MD;  Location: [facility];  Service: Gynecology;  Laterality: N/A;  ? VAGINAL PROLAPSE REPAIR  06/09/2011  ? Procedure: VAGINAL VAULT SUSPENSION;  Surgeon: Reece Packer, MD;  Location: [facility];  Service: Urology;  Laterality: N/A;  ? VEIN LIGATION AND STRIPPING    ? LEFT LEG  ? ? ?Prior to Admission medications   ?Medication Sig Start Date End Date Taking? Authorizing Provider  ?aspirin EC 81 MG tablet Take 81 mg by mouth daily. Swallow whole.   Yes [provider]  ?hydrochlorothiazide (HYDRODIURIL) 25 MG tablet Take 1 tablet (25 mg total) by mouth daily. 08/02/20  Yes Emeterio Reeve, DO  ?Multiple Vitamins-Minerals (ONE-A-DAY WOMENS PO) Take by mouth.   Yes [provider]  ?valsartan (DIOVAN) 40 MG tablet Take 1 tablet (40 mg total) by mouth daily. 08/02/20  Yes Emeterio Reeve, DO  ?ALPRAZolam (XANAX) 0.25 MG tablet Take 1-2 tablets (0.25-0.5 mg total) by mouth 2 (two) times daily as needed for anxiety. 09/15/20   Emeterio Reeve, DO  ?ketoconazole (NIZORAL) 2 % cream Apply 1 application topically daily. 01/14/21   Lorenda Peck, MD  ?omeprazole (PRILOSEC) 40 MG capsule Take 1 capsule (40 mg total) by mouth daily. 03/25/21   Laurey Morale, MD  ?vitamin C (ASCORBIC ACID) 500  MG tablet Take 500 mg by mouth once a week.    [provider]  ? ? ?Current Outpatient Medications  ?Medication Sig Dispense Refill  ? aspirin EC 81 MG tablet Take 81 mg by mouth daily. Swallow whole.    ? hydrochlorothiazide (HYDRODIURIL) 25 MG tablet Take 1 tablet (25 mg total) by mouth daily. 90 tablet 3  ? Multiple Vitamins-Minerals (ONE-A-DAY WOMENS PO) Take by mouth.    ? valsartan (DIOVAN) 40 MG tablet Take 1 tablet (40 mg total) by mouth daily. 90 tablet 3  ? ALPRAZolam (XANAX) 0.25 MG tablet Take 1-2 tablets  (0.25-0.5 mg total) by mouth 2 (two) times daily as needed for anxiety. 15 tablet 0  ? ketoconazole (NIZORAL) 2 % cream Apply 1 application topically daily. 60 g 2  ? omeprazole (PRILOSEC) 40 MG capsule Take 1 capsule (40 mg total) by mouth daily. 30 capsule 5  ? vitamin C (ASCORBIC ACID) 500 MG tablet Take 500 mg by mouth once a week.    ? ?Current Facility-Administered Medications  ?Medication Dose Route Frequency Provider Last Rate Last Admin  ? 0.9 %  sodium chloride infusion  500 mL Intravenous Once Chasitee Zenker, Carlota Raspberry, MD      ? ? ?Allergies as of 04/26/2021 - Review Complete 04/26/2021  ?Allergen Reaction Noted  ? Lidocaine Anaphylaxis, Shortness Of Breath, and Other (See Comments) 08/31/2008  ? Escitalopram  09/08/2010  ? Levofloxacin Itching and Other (See Comments) 05/04/2011  ? Oxycodone-aspirin Other (See Comments) 09/08/2010  ? ? ?Family History  ?Problem Relation Age of Onset  ? Colon cancer Mother 47  ? Colon polyps Father   ? Cancer Father   ?     kidney, bladder   ? Colon polyps Brother   ? Coronary artery disease Other   ? Diabetes Other   ? Hyperlipidemia Other   ? Kidney disease Other   ? Cancer Other   ?     lung  ? Esophageal cancer Neg Hx   ? Stomach cancer Neg Hx   ? Rectal cancer Neg Hx   ? ? ?Social History  ? ?Socioeconomic History  ? Marital status: Married  ?  Spouse name: Not on file  ? Number of children: Not on file  ? Years of education: Not on file  ? Highest education level: Not on file  ?Occupational History  ? Not on file  ?Tobacco Use  ? Smoking status: Former  ?  Types: Cigarettes  ? Smokeless tobacco: Never  ?Vaping Use  ? Vaping Use: Never used  ?Substance and Sexual Activity  ? Alcohol use: Yes  ?  Comment: once a month  ? Drug use: No  ? Sexual activity: Not Currently  ?  Partners: Male  ?Other Topics Concern  ? Not on file  ?Social History Narrative  ? Not on file  ? ?Social Determinants of Health  ? ?Financial Resource Strain: Low Risk   ? Difficulty of Paying Living  Expenses: Not hard at all  ?Food Insecurity: No Food Insecurity  ? Worried About Charity fundraiser in the Last Year: Never true  ? Ran Out of Food in the Last Year: Never true  ?Transportation Needs: No Transportation Needs  ? Lack of Transportation (Medical): No  ? Lack of Transportation (Non-Medical): No  ?Physical Activity: Inactive  ? Days of Exercise per Week: 0 days  ? Minutes of Exercise per Session: 0 min  ?Stress: No Stress Concern Present  ? Feeling of Stress :  Not at all  ?Social Connections: Socially Integrated  ? Frequency of Communication with Friends and Family: Three times a week  ? Frequency of Social Gatherings with Friends and Family: Three times a week  ? Attends Religious Services: 1 to 4 times per year  ? Active Member of Clubs or Organizations: Yes  ? Attends Archivist Meetings: 1 to 4 times per year  ? Marital Status: Married  ?Intimate Partner Violence: Not At Risk  ? Fear of Current or Ex-Partner: No  ? Emotionally Abused: No  ? Physically Abused: No  ? Sexually Abused: No  ? ? ?Review of Systems: ?All other review of systems negative except as mentioned in the HPI. ? ?Physical Exam: ?Vital signs ?BP 139/76   Pulse 96   Temp (!) 96.2 ?F (35.7 ?C)   Ht '5\' 6"'$  (1.676 m)   Wt 216 lb (98 kg)   SpO2 98%   BMI 34.86 kg/m?  ? ?General:   Alert,  Well-developed, pleasant and cooperative in NAD ?Lungs:  Clear throughout to auscultation.   ?Heart:  Regular rate and rhythm ?Abdomen:  Soft, nontender and nondistended.   ?Neuro/Psych:  Alert and cooperative. Normal mood and affect. A and O x 3 ? ?Jolly Mango, MD ?[facility] ? ? ?

## 2021-04-26 NOTE — Progress Notes (Signed)
Called to room to assist during endoscopic procedure.  Patient ID and intended procedure confirmed with present staff. Received instructions for my participation in the procedure from the performing physician.  

## 2021-04-26 NOTE — Patient Instructions (Signed)
Handouts Provided:  Diverticulosis ? ?YOU HAD AN ENDOSCOPIC PROCEDURE TODAY AT [facility]:   Refer to the procedure report that was given to you for any specific questions about what was found during the examination.  If the procedure report does not answer your questions, please call your gastroenterologist to clarify.  If you requested that your care partner not be given the details of your procedure findings, then the procedure report has been included in a sealed envelope for you to review at your convenience later. ? ?YOU SHOULD EXPECT: Some feelings of bloating in the abdomen. Passage of more gas than usual.  Walking can help get rid of the air that was put into your GI tract during the procedure and reduce the bloating. If you had a lower endoscopy (such as a colonoscopy or flexible sigmoidoscopy) you may notice spotting of blood in your stool or on the toilet paper. If you underwent a bowel prep for your procedure, you may not have a normal bowel movement for a few days. ? ?Please Note:  You might notice some irritation and congestion in your nose or some drainage.  This is from the oxygen used during your procedure.  There is no need for concern and it should clear up in a day or so. ? ?SYMPTOMS TO REPORT IMMEDIATELY: ? ?Following lower endoscopy (colonoscopy or flexible sigmoidoscopy): ? Excessive amounts of blood in the stool ? Significant tenderness or worsening of abdominal pains ? Swelling of the abdomen that is new, acute ? Fever of 100?F or higher ? ?For urgent or emergent issues, a gastroenterologist can be reached at any hour by calling [phone]. ?Do not use MyChart messaging for urgent concerns.  ? ? ?DIET:  We do recommend a small meal at first, but then you may proceed to your regular diet.  Drink plenty of fluids but you should avoid alcoholic beverages for 24 hours. ? ?ACTIVITY:  You should plan to take it easy for the rest of today and you should NOT DRIVE or use heavy  machinery until tomorrow (because of the sedation medicines used during the test).   ? ?FOLLOW UP: ?Our staff will call the number listed on your records 48-72 hours following your procedure to check on you and address any questions or concerns that you may have regarding the information given to you following your procedure. If we do not reach you, we will leave a message.  We will attempt to reach you two times.  During this call, we will ask if you have developed any symptoms of COVID 19. If you develop any symptoms (ie: fever, flu-like symptoms, shortness of breath, cough etc.) before then, please call [phone].  If you test positive for Covid 19 in the 2 weeks post procedure, please call and report this information to [place].   ? ?If any biopsies were taken you will be contacted by phone or by letter within the next 1-3 weeks.  Please call us at [phone] if you have not heard about the biopsies in 3 weeks.  ? ? ?SIGNATURES/CONFIDENTIALITY: ?You and/or your care partner have signed paperwork which will be entered into your electronic medical record.  These signatures attest to the fact that that the information above on your After Visit Summary has been reviewed and is understood.  Full responsibility of the confidentiality of this discharge information lies with you and/or your care-partner. ? ?

## 2021-04-26 NOTE — Progress Notes (Signed)
Pt's states no medical or surgical changes since previsit or office visit. 

## 2021-04-26 NOTE — Progress Notes (Signed)
To Pacu, VSS. Report to [email or web address] 

## 2021-04-26 NOTE — Op Note (Signed)
[place] ?Patient Name: Sheyann Sulton ?Procedure Date: 04/26/2021 9:54 AM ?MRN: [id number] ?Endoscopist: Carlota Raspberry. Havery Moros , MD ?Age: 71 ?Referring MD:  ?Date of Birth: 09/25/1950 ?Gender: Female ?Account #: [id number] ?Procedure:                Colonoscopy ?Indications:              High risk colon cancer surveillance: Personal  ?                          history of colonic polyps - ssesile serrated polyp  ?                          12/2015, mother with colon cancer age 50s ?Medicines:                Monitored Anesthesia Care ?Procedure:                Pre-Anesthesia Assessment: ?                          - Prior to the procedure, a History and Physical  ?                          was performed, and patient medications and  ?                          allergies were reviewed. The patient's tolerance of  ?                          previous anesthesia was also reviewed. The risks  ?                          and benefits of the procedure and the sedation  ?                          options and risks were discussed with the patient.  ?                          All questions were answered, and informed consent  ?                          was obtained. Prior Anticoagulants: The patient has  ?                          taken no previous anticoagulant or antiplatelet  ?                          agents. ASA Grade Assessment: II - A patient with  ?                          mild systemic disease. After reviewing the risks  ?                          and benefits, the patient was deemed in  ?  satisfactory condition to undergo the procedure. ?                          After obtaining informed consent, the colonoscope  ?                          was passed under direct vision. Throughout the  ?                          procedure, the patient's blood pressure, pulse, and  ?                          oxygen saturations were monitored continuously.The  ?                          colonoscopy was  performed without difficulty. The  ?                          patient tolerated the procedure well. The quality  ?                          of the bowel preparation was good. The ileocecal  ?                          valve, appendiceal orifice, and rectum were  ?                          photographed. The 0441 [id number]-[id number] Slim SB  ?                          Colonoscope was introduced through the anus and  ?                          advanced to the the cecum, identified by  ?                          appendiceal orifice and ileocecal valve. ?Scope In: 10:04:12 AM ?Scope Out: 10:29:15 AM ?Scope Withdrawal Time: 0 hours 14 minutes 43 seconds  ?Total Procedure Duration: 0 hours 25 minutes 3 seconds  ?Findings:                 The perianal and digital rectal examinations were  ?                          normal. ?                          Many small-mouthed diverticula were found in the  ?                          entire colon. Mild in right and transverse colon,  ?                          severe in the distal left colon. There was luminal  ?  narrowing / edema in the lower sigmoid /  ?                          rectosigmoid colon from diverticular changes. Adult  ?                          colonoscope could not traverse narrowed lumen in  ?                          the lower sigmoid colon. This was switched to an  ?                          ultraslim pediatric colonoscope which easily  ?                          traversed the area. ?                          A localized area of edematous / inflamed mucosa was  ?                          found in the recto-sigmoid / distal sigmoid colon  ?                          around an area of what appeared to be an inflamed  ?                          diverticulum.. Biopsies were taken with a cold  ?                          forceps for histology to ensure no dysplastic /  ?                          adenomatous change but think this very unlikely. ?                           Internal hemorrhoids were found during retroflexion. ?                          The exam was otherwise without abnormality. No  ?                          polyps ?Complications:            No immediate complications. Estimated blood loss:  ?                          Minimal. ?Estimated Blood Loss:     Estimated blood loss was minimal. ?Impression:               - Diverticulosis in the entire examined colon.  ?                          Severe in the left colon with luminal narrowing -  ?  ultraslim pediatric colonoscope used to complete  ?                          the exam. ?                          - Abnormal mucosa in the recto-sigmoid colon as  ?                          above - suspect inflammatory changes from  ?                          diverticulosis. Biopsied. ?                          - Internal hemorrhoids. ?                          - The examination was otherwise normal. ?Recommendation:           - Patient has a contact number available for  ?                          emergencies. The signs and symptoms of potential  ?                          delayed complications were discussed with the  ?                          patient. Return to normal activities tomorrow.  ?                          Written discharge instructions were provided to the  ?                          patient. ?                          - Resume previous diet. ?                          - Continue present medications. ?                          - Await pathology results. Anticipate repeat  ?                          colonoscopy in 5 years for strong family history of  ?                          colon cancer ?                          - If symptoms of diverticulitis, low threshold to  ?                          give a course of bentyl as needed and /  or  ?                          antibiotics ?Remo Lipps P. Wilder Kurowski, MD ?04/26/2021 10:37:51 AM ?This report has been signed electronically. ?

## 2021-04-28 ENCOUNTER — Telehealth: Payer: Self-pay

## 2021-04-28 ENCOUNTER — Encounter: Payer: Self-pay | Admitting: Gastroenterology

## 2021-04-28 NOTE — Telephone Encounter (Signed)
?  Follow up Call- ? ? ?  04/26/2021  ?  9:18 AM  ?Call back number  ?Post procedure Call Back phone  # [phone]  ?Permission to leave phone message Yes  ?  ? ?Patient questions: ? ?Do you have a fever, pain , or abdominal swelling? No. ?Pain Score  0 * ? ?Have you tolerated food without any problems? Yes.   ? ?Have you been able to return to your normal activities? Yes.   ? ?Do you have any questions about your discharge instructions: ?Diet   No. ?Medications  No. ?Follow up visit  No. ? ?Do you have questions or concerns about your Care? No. ? ?Actions: ?* If pain score is 4 or above: ?No action needed, pain <4. ? ? ?

## 2021-05-11 ENCOUNTER — Telehealth: Payer: Self-pay | Admitting: Family Medicine

## 2021-05-11 NOTE — Telephone Encounter (Signed)
Spoke with patient, message given.    

## 2021-05-11 NOTE — Telephone Encounter (Signed)
She can have these procedures as scheduled. They are far enough apart that one will never interfere with the others  ?

## 2021-05-11 NOTE — Telephone Encounter (Signed)
Requesting call from nurse to discuss sedation during a surgery (oral surgery and orthopedic surgery), she may be having surgery and would like to get info for safe timeframes between anesthesia ?

## 2021-05-11 NOTE — Telephone Encounter (Signed)
Called and spoke with patient.   Patient stated that she had Colonoscopy on 04/26/21 on that day she had anesthesia.  ?  Around last week of July, pt is planning to schedule Oral Surgery, which she will receive anesthesia on that day.     ?At the mid or end of September pt is planning to schedule right knee replacement, which she will receive anesthesia on that day also. ?  Question the patient has is it okay to receive anesthesia to be placed asleep within the time frames noted above.   Please advise ?

## 2021-05-12 DIAGNOSIS — N8111 Cystocele, midline: Secondary | ICD-10-CM | POA: Diagnosis not present

## 2021-05-12 DIAGNOSIS — Z6835 Body mass index (BMI) 35.0-35.9, adult: Secondary | ICD-10-CM | POA: Diagnosis not present

## 2021-05-12 DIAGNOSIS — N952 Postmenopausal atrophic vaginitis: Secondary | ICD-10-CM | POA: Diagnosis not present

## 2021-06-02 ENCOUNTER — Other Ambulatory Visit: Payer: Self-pay | Admitting: Osteopathic Medicine

## 2021-06-14 ENCOUNTER — Telehealth: Payer: Self-pay | Admitting: Family Medicine

## 2021-06-14 ENCOUNTER — Ambulatory Visit (INDEPENDENT_AMBULATORY_CARE_PROVIDER_SITE_OTHER): Payer: Medicare Other | Admitting: Family Medicine

## 2021-06-14 ENCOUNTER — Encounter: Payer: Self-pay | Admitting: Family Medicine

## 2021-06-14 VITALS — BP 124/82 | HR 91 | Temp 98.7°F | Wt 215.0 lb

## 2021-06-14 DIAGNOSIS — G5603 Carpal tunnel syndrome, bilateral upper limbs: Secondary | ICD-10-CM | POA: Diagnosis not present

## 2021-06-14 DIAGNOSIS — F419 Anxiety disorder, unspecified: Secondary | ICD-10-CM

## 2021-06-14 DIAGNOSIS — R300 Dysuria: Secondary | ICD-10-CM

## 2021-06-14 DIAGNOSIS — M2550 Pain in unspecified joint: Secondary | ICD-10-CM | POA: Diagnosis not present

## 2021-06-14 LAB — POC URINALSYSI DIPSTICK (AUTOMATED)
Bilirubin, UA: NEGATIVE
Blood, UA: NEGATIVE
Glucose, UA: NEGATIVE
Ketones, UA: NEGATIVE
Leukocytes, UA: NEGATIVE
Nitrite, UA: NEGATIVE
Protein, UA: NEGATIVE
Spec Grav, UA: 1.01 (ref 1.010–1.025)
Urobilinogen, UA: 0.2 E.U./dL
pH, UA: 6.5 (ref 5.0–8.0)

## 2021-06-14 LAB — CBC WITH DIFFERENTIAL/PLATELET
Basophils Absolute: 0 10*3/uL (ref 0.0–0.1)
Basophils Relative: 0.7 % (ref 0.0–3.0)
Eosinophils Absolute: 0.1 10*3/uL (ref 0.0–0.7)
Eosinophils Relative: 1.5 % (ref 0.0–5.0)
HCT: 39.7 % (ref 36.0–46.0)
Hemoglobin: 13.4 g/dL (ref 12.0–15.0)
Lymphocytes Relative: 23.9 % (ref 12.0–46.0)
Lymphs Abs: 1.7 10*3/uL (ref 0.7–4.0)
MCHC: 33.7 g/dL (ref 30.0–36.0)
MCV: 90.6 fl (ref 78.0–100.0)
Monocytes Absolute: 0.4 10*3/uL (ref 0.1–1.0)
Monocytes Relative: 6.2 % (ref 3.0–12.0)
Neutro Abs: 4.8 10*3/uL (ref 1.4–7.7)
Neutrophils Relative %: 67.7 % (ref 43.0–77.0)
Platelets: 200 10*3/uL (ref 150.0–400.0)
RBC: 4.39 Mil/uL (ref 3.87–5.11)
RDW: 14.1 % (ref 11.5–15.5)
WBC: 7.1 10*3/uL (ref 4.0–10.5)

## 2021-06-14 LAB — SEDIMENTATION RATE: Sed Rate: 7 mm/hr (ref 0–30)

## 2021-06-14 LAB — C-REACTIVE PROTEIN: CRP: <1 mg/dL (ref 0.5–20.0)

## 2021-06-14 MED ORDER — HYDROCHLOROTHIAZIDE 25 MG PO TABS: 25.0000 mg | ORAL_TABLET | Freq: Every day | ORAL | 3 refills | Status: DC

## 2021-06-14 MED ORDER — LORAZEPAM 0.5 MG PO TABS: 0.5000 mg | ORAL_TABLET | Freq: Three times a day (TID) | ORAL | 2 refills | Status: DC | PRN

## 2021-06-14 NOTE — Telephone Encounter (Signed)
Pt states there ws a discussion during her visit today regarding her seeing a urologist for her cystitis. She would like to go forward with that treatment, whatever you feel would be best.

## 2021-06-14 NOTE — Telephone Encounter (Signed)
Please advise 

## 2021-06-14 NOTE — Telephone Encounter (Signed)
Spoke with pt verbalized understanding to call her urologist office to schedule appointment

## 2021-06-14 NOTE — Telephone Encounter (Signed)
She already has a urologist (Dr. Zigmund Daniel at [facility]). She can simply make an appt with her

## 2021-06-14 NOTE — Progress Notes (Signed)
   Subjective:    Patient ID: Megan Crane, female    DOB: 1950-12-18, 71 y.o.   MRN: [id number]  HPI Here for several issues. First she has had increased frequency of urination with some burning for the past 2 weeks. No fever. She drinks plenty of water. She saw Dr. Maryland Pink of [facility] on 05-12-21, and she had a normal follow up. Second she has frequent stiffness and pain in many of her joints and she asks for labs to look for possible arthritis. Third she has occasional anxiety and she asks to try a mild medication on an as needed basis. Fourth she describes frequent numbness in both hands, especially when she is driving. No pain or swelling or color changes.    Review of Systems  Constitutional: Negative.   Respiratory: Negative.    Cardiovascular: Negative.   Genitourinary:  Positive for dysuria and frequency. Negative for hematuria and urgency.  Musculoskeletal:  Positive for arthralgias.  Neurological:  Positive for numbness.  Psychiatric/Behavioral:  Negative for agitation, dysphoric mood and sleep disturbance. The patient is nervous/anxious.       Objective:   Physical Exam Constitutional:      Appearance: Normal appearance. She is not ill-appearing.  Cardiovascular:     Rate and Rhythm: Normal rate and regular rhythm.     Pulses: Normal pulses.     Heart sounds: Normal heart sounds.  Pulmonary:     Effort: Pulmonary effort is normal.     Breath sounds: Normal breath sounds.  Abdominal:     General: Abdomen is flat. Bowel sounds are normal. There is no distension.     Palpations: Abdomen is soft. There is no mass.     Tenderness: There is no abdominal tenderness. There is no right CVA tenderness, left CVA tenderness, guarding or rebound.     Hernia: No hernia is present.  Musculoskeletal:        General: No swelling or tenderness.  Neurological:     Mental Status: She is alert.  Psychiatric:        Mood and Affect: Mood normal.        Behavior:  Behavior normal.        Thought Content: Thought content normal.          Assessment & Plan:  For the dysuria, she may have some interstitial cystitis. I advised her to follow up with Urology for this. Second we will get labs today to look at inflammation markers and possible arthritis. Third she has some carpal tunnel symptoms, and I suggested she wear wrist splints to bed every night. Lastly for the anxiety she can try Lorazepam 0.5 mg as needed.  Alysia Penna, MD

## 2021-06-15 LAB — RHEUMATOID FACTOR: Rheumatoid fact SerPl-aCnc: <14 IU/mL (ref <14)

## 2021-06-16 DIAGNOSIS — R399 Unspecified symptoms and signs involving the genitourinary system: Secondary | ICD-10-CM | POA: Diagnosis not present

## 2021-06-24 ENCOUNTER — Ambulatory Visit (INDEPENDENT_AMBULATORY_CARE_PROVIDER_SITE_OTHER): Payer: Medicare Other | Admitting: Family Medicine

## 2021-06-24 ENCOUNTER — Encounter: Payer: Self-pay | Admitting: Family Medicine

## 2021-06-24 VITALS — BP 124/78 | HR 73 | Temp 98.8°F | Wt 212.0 lb

## 2021-06-24 DIAGNOSIS — N390 Urinary tract infection, site not specified: Secondary | ICD-10-CM | POA: Diagnosis not present

## 2021-06-24 DIAGNOSIS — N898 Other specified noninflammatory disorders of vagina: Secondary | ICD-10-CM | POA: Diagnosis not present

## 2021-06-24 LAB — POC URINALSYSI DIPSTICK (AUTOMATED)
Bilirubin, UA: NEGATIVE
Blood, UA: NEGATIVE
Glucose, UA: NEGATIVE
Ketones, UA: NEGATIVE
Leukocytes, UA: NEGATIVE
Nitrite, UA: NEGATIVE
Protein, UA: NEGATIVE
Spec Grav, UA: 1.01 (ref 1.010–1.025)
Urobilinogen, UA: 0.2 E.U./dL
pH, UA: 6 (ref 5.0–8.0)

## 2021-06-24 MED ORDER — ESTRADIOL 0.1 MG/GM VA CREA: 1.0000 | TOPICAL_CREAM | Freq: Every day | VAGINAL | 0 refills | Status: DC

## 2021-06-24 NOTE — Addendum Note (Signed)
Addended by: Wyvonne Lenz on: 06/24/2021 12:17 PM   Modules accepted: Orders

## 2021-06-24 NOTE — Progress Notes (Signed)
   Subjective:    Patient ID: Megan Crane, female    DOB: 1950-02-08, 71 y.o.   MRN: [id number]  HPI Here to follow up on a UTI and what is likely IC. She was having burning with urination, and she submitted a urine sample to Dr. Maryland Pink, her urologist, on 06-20-21. This grew Klebsiella variiola, and she was treated with 5 days of Macrobid. Today she feels better but wants [place] to check another urine sample. She also describes vaginal itching that she has had intermittently for the past year. No DC.    Review of Systems  Constitutional: Negative.   Respiratory: Negative.    Cardiovascular: Negative.   Genitourinary:  Positive for dysuria. Negative for hematuria, urgency, vaginal bleeding and vaginal discharge.       Objective:   Physical Exam Constitutional:      Appearance: Normal appearance.  Cardiovascular:     Rate and Rhythm: Normal rate and regular rhythm.     Pulses: Normal pulses.     Heart sounds: Normal heart sounds.  Pulmonary:     Effort: Pulmonary effort is normal.     Breath sounds: Normal breath sounds.  Neurological:     Mental Status: She is alert.           Assessment & Plan:  She is following up on a recent UTI. Her UA today is clear, so the UTI appears to be resolved. As for the vaginal itching, she likely has vaginal atrophy. We will let her try a tube of Estrace vaginal cream to be applied daily. She will see her GYN sometime soon for a pelvic exam.  Alysia Penna, MD

## 2021-07-04 DIAGNOSIS — N760 Acute vaginitis: Secondary | ICD-10-CM | POA: Diagnosis not present

## 2021-07-04 DIAGNOSIS — N390 Urinary tract infection, site not specified: Secondary | ICD-10-CM | POA: Diagnosis not present

## 2021-07-04 DIAGNOSIS — N951 Menopausal and female climacteric states: Secondary | ICD-10-CM | POA: Diagnosis not present

## 2021-07-29 DIAGNOSIS — R300 Dysuria: Secondary | ICD-10-CM | POA: Diagnosis not present

## 2021-08-02 ENCOUNTER — Ambulatory Visit: Payer: Medicare Other | Admitting: Physician Assistant

## 2021-08-09 ENCOUNTER — Telehealth: Payer: Self-pay | Admitting: Family Medicine

## 2021-08-09 NOTE — Telephone Encounter (Signed)
Pt is calling to let md know she went to dr Frederik Schmidt and had 10 teeth removed and to thank dr fry for his recommendation

## 2021-08-09 NOTE — Telephone Encounter (Signed)
FYI

## 2021-08-18 ENCOUNTER — Ambulatory Visit: Payer: Medicare Other | Admitting: Podiatry

## 2021-08-22 ENCOUNTER — Encounter: Payer: Self-pay | Admitting: Family Medicine

## 2021-08-22 ENCOUNTER — Ambulatory Visit (INDEPENDENT_AMBULATORY_CARE_PROVIDER_SITE_OTHER): Payer: Medicare Other | Admitting: Family Medicine

## 2021-08-22 VITALS — BP 108/70 | HR 93 | Temp 99.1°F | Wt 210.0 lb

## 2021-08-22 DIAGNOSIS — L240 Irritant contact dermatitis due to detergents: Secondary | ICD-10-CM

## 2021-08-22 MED ORDER — TRIAMCINOLONE ACETONIDE 0.1 % EX CREA: 1.0000 | TOPICAL_CREAM | Freq: Two times a day (BID) | CUTANEOUS | 2 refills | Status: DC

## 2021-08-22 NOTE — Progress Notes (Signed)
   Subjective:    Patient ID: Megan Crane, female    DOB: October 07, 1950, 71 y.o.   MRN: [id number]  HPI Here for irritation and rash on the upper chest and arms for the past 3 days. The skin itches, and she has ben applying Aveeno. She thinks this was caused by her spraying Clorox last weekend to clean her outdoor [employer].    Review of Systems  Constitutional: Negative.   Respiratory: Negative.    Cardiovascular: Negative.   Skin:  Positive for rash.       Objective:   Physical Exam Constitutional:      Appearance: Normal appearance. She is not ill-appearing.  Cardiovascular:     Rate and Rhythm: Normal rate and regular rhythm.     Pulses: Normal pulses.     Heart sounds: Normal heart sounds.  Pulmonary:     Effort: Pulmonary effort is normal.     Breath sounds: Normal breath sounds.  Skin:    Comments: Scattered fine maulopapular red rash as above   Neurological:     Mental Status: She is alert.           Assessment & Plan:  She has had a reaction to cleaning solution touching her skin, possibly exacerbated by sun exposure. She can apply Triamcinolone cream to these areas until clear.  Alysia Penna, MD

## 2021-09-23 ENCOUNTER — Ambulatory Visit (INDEPENDENT_AMBULATORY_CARE_PROVIDER_SITE_OTHER): Payer: Medicare Other | Admitting: Podiatry

## 2021-09-23 DIAGNOSIS — Z91199 Patient's noncompliance with other medical treatment and regimen due to unspecified reason: Secondary | ICD-10-CM

## 2021-09-23 NOTE — Progress Notes (Signed)
No show

## 2021-10-05 ENCOUNTER — Telehealth: Payer: Self-pay | Admitting: Family Medicine

## 2021-10-05 NOTE — Telephone Encounter (Signed)
That’s fine with me

## 2021-10-05 NOTE — Telephone Encounter (Signed)
Patient called to advise that she lives in [place] and would like to transfer from Dr. Barbie Banner care to Megan Crane due to proximity to her home. Please advise if this okay.

## 2021-10-06 ENCOUNTER — Ambulatory Visit: Payer: Medicare Other | Admitting: Family Medicine

## 2021-10-13 DIAGNOSIS — L570 Actinic keratosis: Secondary | ICD-10-CM | POA: Diagnosis not present

## 2021-10-13 DIAGNOSIS — C44311 Basal cell carcinoma of skin of nose: Secondary | ICD-10-CM | POA: Diagnosis not present

## 2021-10-13 DIAGNOSIS — D1801 Hemangioma of skin and subcutaneous tissue: Secondary | ICD-10-CM | POA: Diagnosis not present

## 2021-10-13 DIAGNOSIS — Z85828 Personal history of other malignant neoplasm of skin: Secondary | ICD-10-CM | POA: Diagnosis not present

## 2021-10-13 DIAGNOSIS — L820 Inflamed seborrheic keratosis: Secondary | ICD-10-CM | POA: Diagnosis not present

## 2021-10-13 DIAGNOSIS — L821 Other seborrheic keratosis: Secondary | ICD-10-CM | POA: Diagnosis not present

## 2021-10-13 DIAGNOSIS — D235 Other benign neoplasm of skin of trunk: Secondary | ICD-10-CM | POA: Diagnosis not present

## 2021-10-13 DIAGNOSIS — L578 Other skin changes due to chronic exposure to nonionizing radiation: Secondary | ICD-10-CM | POA: Diagnosis not present

## 2021-10-13 DIAGNOSIS — L720 Epidermal cyst: Secondary | ICD-10-CM | POA: Diagnosis not present

## 2021-10-18 ENCOUNTER — Ambulatory Visit (INDEPENDENT_AMBULATORY_CARE_PROVIDER_SITE_OTHER): Payer: Medicare Other | Admitting: Family Medicine

## 2021-10-18 ENCOUNTER — Encounter: Payer: Self-pay | Admitting: Family Medicine

## 2021-10-18 VITALS — BP 124/80 | HR 87 | Temp 98.4°F | Wt 207.0 lb

## 2021-10-18 DIAGNOSIS — N390 Urinary tract infection, site not specified: Secondary | ICD-10-CM

## 2021-10-18 DIAGNOSIS — R350 Frequency of micturition: Secondary | ICD-10-CM

## 2021-10-18 LAB — POC URINALSYSI DIPSTICK (AUTOMATED)
Bilirubin, UA: NEGATIVE
Blood, UA: NEGATIVE
Glucose, UA: NEGATIVE
Ketones, UA: NEGATIVE
Nitrite, UA: NEGATIVE
Protein, UA: NEGATIVE
Spec Grav, UA: 1.01 (ref 1.010–1.025)
Urobilinogen, UA: NEGATIVE E.U./dL — AB
pH, UA: 6.5 (ref 5.0–8.0)

## 2021-10-18 MED ORDER — SULFAMETHOXAZOLE-TRIMETHOPRIM 800-160 MG PO TABS: 1.0000 | ORAL_TABLET | Freq: Two times a day (BID) | ORAL | 0 refills | Status: DC

## 2021-10-18 NOTE — Progress Notes (Signed)
   Subjective:    Patient ID: Megan Crane, female    DOB: [birth year], 71 y.o.   MRN: [id number]  HPI Here for 4 days of urinary urgency and lower abdominal pressure. No fever. She drinks plenty of water every day.    Review of Systems  Constitutional: Negative.   Respiratory: Negative.    Cardiovascular: Negative.   Gastrointestinal: Negative.   Genitourinary:  Positive for frequency and urgency. Negative for dysuria, flank pain and hematuria.       Objective:   Physical Exam Constitutional:      Appearance: Normal appearance. She is not ill-appearing.  Cardiovascular:     Rate and Rhythm: Normal rate and regular rhythm.     Pulses: Normal pulses.     Heart sounds: Normal heart sounds.  Pulmonary:     Effort: Pulmonary effort is normal.     Breath sounds: Normal breath sounds.  Abdominal:     General: Abdomen is flat. Bowel sounds are normal. There is no distension.     Palpations: Abdomen is soft. There is no mass.     Tenderness: There is no abdominal tenderness. There is no right CVA tenderness, left CVA tenderness, guarding or rebound.     Hernia: No hernia is present.  Neurological:     Mental Status: She is alert.           Assessment & Plan:  UTI, treat with 7 days of Bactrim DS. Culture the sample.  Alysia Penna, MD

## 2021-10-19 LAB — URINE CULTURE
MICRO NUMBER:: 14031535
SPECIMEN QUALITY:: ADEQUATE

## 2021-10-20 ENCOUNTER — Telehealth: Payer: Self-pay | Admitting: Family Medicine

## 2021-10-20 NOTE — Telephone Encounter (Addendum)
Pt is on abx and would like to know if she should get flu shot or PNA vaccine before flying out on 11-08-2021

## 2021-10-21 NOTE — Telephone Encounter (Signed)
Pt will come in on 10-28-2021 for flu shot only

## 2021-10-21 NOTE — Telephone Encounter (Signed)
Spoke with patient about message.  Voiced understanding.

## 2021-10-21 NOTE — Telephone Encounter (Signed)
Yes I suggest she get the flu shot the week before she leaves, then get the pneumonia shot after she gets back

## 2021-10-24 ENCOUNTER — Telehealth: Payer: Self-pay | Admitting: Family Medicine

## 2021-10-24 NOTE — Telephone Encounter (Signed)
Left message for patient to call back and schedule Medicare Annual Wellness Visit (AWV) either virtually or in office. Left  my jabber number [phone]   Last AWV 10/06/20 please schedule with Nurse Health Adviser   45 min for awv-i and in office appointments 30 min for awv-s  phone/virtual appointments   

## 2021-10-28 ENCOUNTER — Ambulatory Visit (INDEPENDENT_AMBULATORY_CARE_PROVIDER_SITE_OTHER): Payer: Medicare Other

## 2021-10-28 ENCOUNTER — Ambulatory Visit: Payer: Medicare Other

## 2021-10-28 VITALS — BP 120/60 | HR 66 | Temp 98.1°F | Ht 65.5 in | Wt 209.3 lb

## 2021-10-28 DIAGNOSIS — Z23 Encounter for immunization: Secondary | ICD-10-CM | POA: Diagnosis not present

## 2021-10-28 DIAGNOSIS — Z0000 Encounter for general adult medical examination without abnormal findings: Secondary | ICD-10-CM

## 2021-10-28 DIAGNOSIS — Z13820 Encounter for screening for osteoporosis: Secondary | ICD-10-CM

## 2021-10-28 NOTE — Patient Instructions (Addendum)
Megan Crane , Thank you for taking time to come for your Medicare Wellness Visit. I appreciate your ongoing commitment to your health goals. Please review the following plan we discussed and let me know if I can assist you in the future.   These are the goals we discussed:  Goals       Patient Stated (pt-stated)      I would like to go to the [facility] Monday-Thursday to work out and walk more.        This is a list of the screening recommended for you and due dates:  Health Maintenance  Topic Date Due   Hepatitis C Screening: USPSTF Recommendation to screen - Ages 57-79 yo.  Never done   DEXA scan (bone density measurement)  10/14/2017   COVID-19 Vaccine (4 - Pfizer series) 11/13/2021*   Zoster (Shingles) Vaccine (1 of 2) 01/28/2022*   Pneumonia Vaccine (2 - PPSV23 or PCV20) 10/29/2022*   Tetanus Vaccine  07/23/2022   Mammogram  01/20/2023   Colon Cancer Screening  04/27/2026   Flu Shot  Completed   HPV Vaccine  Aged Out  *Topic was postponed. The date shown is not the original due date.    Advanced directives: Please bring a copy of your health care power of attorney and living will to the office to be added to your chart at your convenience.   Conditions/risks identified: None  Next appointment: Follow up in one year for your annual wellness visit     Preventive Care 65 Years and Older, Female Preventive care refers to lifestyle choices and visits with your health care provider that can promote health and wellness. What does preventive care include? A yearly physical exam. This is also called an annual well check. Dental exams once or twice a year. Routine eye exams. Ask your health care provider how often you should have your eyes checked. Personal lifestyle choices, including: Daily care of your teeth and gums. Regular physical activity. Eating a healthy diet. Avoiding tobacco and drug use. Limiting alcohol use. Practicing safe sex. Taking low-dose aspirin every  day. Taking vitamin and mineral supplements as recommended by your health care provider. What happens during an annual well check? The services and screenings done by your health care provider during your annual well check will depend on your age, overall health, lifestyle risk factors, and family history of disease. Counseling  Your health care provider may ask you questions about your: Alcohol use. Tobacco use. Drug use. Emotional well-being. Home and relationship well-being. Sexual activity. Eating habits. History of falls. Memory and ability to understand (cognition). Work and work Statistician. Reproductive health. Screening  You may have the following tests or measurements: Height, weight, and BMI. Blood pressure. Lipid and cholesterol levels. These may be checked every 5 years, or more frequently if you are over 55 years old. Skin check. Lung cancer screening. You may have this screening every year starting at age 77 if you have a 30-pack-year history of smoking and currently smoke or have quit within the past 15 years. Fecal occult blood test (FOBT) of the stool. You may have this test every year starting at age 28. Flexible sigmoidoscopy or colonoscopy. You may have a sigmoidoscopy every 5 years or a colonoscopy every 10 years starting at age 75. Hepatitis C blood test. Hepatitis B blood test. Sexually transmitted disease (STD) testing. Diabetes screening. This is done by checking your blood sugar (glucose) after you have not eaten for a while (fasting). You may  have this done every 1-3 years. Bone density scan. This is done to screen for osteoporosis. You may have this done starting at age 17. Mammogram. This may be done every 1-2 years. Talk to your health care provider about how often you should have regular mammograms. Talk with your health care provider about your test results, treatment options, and if necessary, the need for more tests. Vaccines  Your health care  provider may recommend certain vaccines, such as: Influenza vaccine. This is recommended every year. Tetanus, diphtheria, and acellular pertussis (Tdap, Td) vaccine. You may need a Td booster every 10 years. Zoster vaccine. You may need this after age 55. Pneumococcal 13-valent conjugate (PCV13) vaccine. One dose is recommended after age 16. Pneumococcal polysaccharide (PPSV23) vaccine. One dose is recommended after age 8. Talk to your health care provider about which screenings and vaccines you need and how often you need them. This information is not intended to replace advice given to you by your health care provider. Make sure you discuss any questions you have with your health care provider. Document Released: 01/22/2015 Document Revised: 09/15/2015 Document Reviewed: 10/27/2014 Elsevier Interactive Patient Education  2017 [place] Prevention in the Home Falls can cause injuries. They can happen to people of all ages. There are many things you can do to make your home safe and to help prevent falls. What can I do on the outside of my home? Regularly fix the edges of walkways and driveways and fix any cracks. Remove anything that might make you trip as you walk through a door, such as a raised step or threshold. Trim any bushes or trees on the path to your home. Use bright outdoor lighting. Clear any walking paths of anything that might make someone trip, such as rocks or tools. Regularly check to see if handrails are loose or broken. Make sure that both sides of any steps have handrails. Any raised decks and porches should have guardrails on the edges. Have any leaves, snow, or ice cleared regularly. Use sand or salt on walking paths during winter. Clean up any spills in your garage right away. This includes oil or grease spills. What can I do in the bathroom? Use night lights. Install grab bars by the toilet and in the tub and shower. Do not use towel bars as grab  bars. Use non-skid mats or decals in the tub or shower. If you need to sit down in the shower, use a plastic, non-slip stool. Keep the floor dry. Clean up any water that spills on the floor as soon as it happens. Remove soap buildup in the tub or shower regularly. Attach bath mats securely with double-sided non-slip rug tape. Do not have throw rugs and other things on the floor that can make you trip. What can I do in the bedroom? Use night lights. Make sure that you have a light by your bed that is easy to reach. Do not use any sheets or blankets that are too big for your bed. They should not hang down onto the floor. Have a firm chair that has side arms. You can use this for support while you get dressed. Do not have throw rugs and other things on the floor that can make you trip. What can I do in the kitchen? Clean up any spills right away. Avoid walking on wet floors. Keep items that you use a lot in easy-to-reach places. If you need to reach something above you, use a strong step  stool that has a grab bar. Keep electrical cords out of the way. Do not use floor polish or wax that makes floors slippery. If you must use wax, use non-skid floor wax. Do not have throw rugs and other things on the floor that can make you trip. What can I do with my stairs? Do not leave any items on the stairs. Make sure that there are handrails on both sides of the stairs and use them. Fix handrails that are broken or loose. Make sure that handrails are as long as the stairways. Check any carpeting to make sure that it is firmly attached to the stairs. Fix any carpet that is loose or worn. Avoid having throw rugs at the top or bottom of the stairs. If you do have throw rugs, attach them to the floor with carpet tape. Make sure that you have a light switch at the top of the stairs and the bottom of the stairs. If you do not have them, ask someone to add them for you. What else can I do to help prevent  falls? Wear shoes that: Do not have high heels. Have rubber bottoms. Are comfortable and fit you well. Are closed at the toe. Do not wear sandals. If you use a stepladder: Make sure that it is fully opened. Do not climb a closed stepladder. Make sure that both sides of the stepladder are locked into place. Ask someone to hold it for you, if possible. Clearly mark and make sure that you can see: Any grab bars or handrails. First and last steps. Where the edge of each step is. Use tools that help you move around (mobility aids) if they are needed. These include: Canes. Walkers. Scooters. Crutches. Turn on the lights when you go into a dark area. Replace any light bulbs as soon as they burn out. Set up your furniture so you have a clear path. Avoid moving your furniture around. If any of your floors are uneven, fix them. If there are any pets around you, be aware of where they are. Review your medicines with your doctor. Some medicines can make you feel dizzy. This can increase your chance of falling. Ask your doctor what other things that you can do to help prevent falls. This information is not intended to replace advice given to you by your health care provider. Make sure you discuss any questions you have with your health care provider. Document Released: 10/22/2008 Document Revised: 06/03/2015 Document Reviewed: 01/30/2014 Elsevier Interactive Patient Education  2017 [employer].

## 2021-10-28 NOTE — Progress Notes (Signed)
Subjective:   Megan Crane is a 71 y.o. female who presents for Medicare Annual (Subsequent) preventive examination.  Review of Systems     Cardiac Risk Factors include: advanced age (>22mn, >>5women);hypertension     Objective:    Today's Vitals   10/28/21 1030  BP: 120/60  Pulse: 66  Temp: 98.1 F (36.7 C)  TempSrc: Oral  SpO2: 97%  Weight: 209 lb 4.8 oz (94.9 kg)  Height: 5' 5.5" (1.664 m)   Body mass index is 34.3 kg/m.     10/28/2021   10:45 AM 03/25/2021    5:36 PM 10/06/2020   11:36 AM 10/06/2019   11:28 AM 12/22/2015    2:18 PM 02/03/2014    6:33 AM 06/09/2011   12:21 PM  Advanced Directives  Does Patient Have a Medical Advance Directive? Yes No Yes Yes No No Patient does not have advance directive  Type of Advance Directive [place]  [place]     Does patient want to make changes to medical advance directive?  No - Patient declined  No - Patient declined     Copy of [place] Chart? No - copy requested  No - copy requested No - copy requested     Would patient like information on creating a medical advance directive?      Yes - Educational materials given   Pre-existing out of facility DNR order (yellow form or pink MOST form)       No    Current Medications (verified) Outpatient Encounter Medications as of 10/28/2021  Medication Sig   aspirin EC 81 MG tablet Take 81 mg by mouth daily. Swallow whole.   estradiol (ESTRACE VAGINAL) 0.1 MG/GM vaginal cream Place 1 Applicatorful vaginally at bedtime.   hydrochlorothiazide (HYDRODIURIL) 25 MG tablet Take 1 tablet (25 mg total) by mouth daily.   ketoconazole (NIZORAL) 2 % cream Apply 1 application topically daily.   LORazepam (ATIVAN) 0.5 MG tablet Take 1 tablet (0.5 mg total) by mouth every 8 (eight) hours as needed for anxiety.   Multiple Vitamins-Minerals (ONE-A-DAY WOMENS PO) Take  by mouth.   omeprazole (PRILOSEC) 40 MG capsule Take 1 capsule (40 mg total) by mouth daily.   solifenacin (VESICARE) 5 MG tablet Take 5 mg by mouth daily.   sulfamethoxazole-trimethoprim (BACTRIM DS) 800-160 MG tablet Take 1 tablet by mouth 2 (two) times daily.   triamcinolone cream (KENALOG) 0.1 % Apply 1 Application topically 2 (two) times daily.   valsartan (DIOVAN) 40 MG tablet TAKE ONE TABLET BY MOUTH DAILY   vitamin C (ASCORBIC ACID) 500 MG tablet Take 500 mg by mouth once a week.   No facility-administered encounter medications on file as of 10/28/2021.    Allergies (verified) Lidocaine, Escitalopram, Levofloxacin, and Oxycodone-aspirin   History: Past Medical History:  Diagnosis Date   Anxiety disorder    hx vasovagal responses   Headache(784.0)    History of kidney stones    Hydronephrosis, right    Hyperlipidemia    Hypertension    Nephrolithiasis    Positive nasal culture for methicillin resistant Staphylococcus aureus    Right ureteral stone    Urinary incontinence    sees Dr. MWendy Poet   Past Surgical History:  Procedure Laterality Date   COLONOSCOPY  12/28/2015   per Dr. AHavery Moros serrated polyps and diverticula, repeat in 3 yrs    CYSTOSCOPY  06/09/2011  Procedure: CYSTOSCOPY;  Surgeon: Reece Packer, MD;  Location: [facility];  Service: Urology;  Laterality: N/A;   CYSTOSCOPY W/ URETERAL STENT PLACEMENT  02/03/2014   Procedure: CYSTOSCOPY WITH  RIGHT RETROGRADE PYELOGRAM/ RIGHT URETERAL STENT PLACEMENT;  Surgeon: Malka So, MD;  Location: [facility];  Service: Urology;;   CYSTOSCOPY WITH BIOPSY  02/03/2014   Procedure: CYSTOSCOPY WITH BIOPSY;  Surgeon: Malka So, MD;  Location: [facility];  Service: Urology;;   PILONIDAL CYST [place]  06/09/2011   Procedure: POSTERIOR REPAIR (RECTOCELE);  Surgeon: Reece Packer, MD;  Location: [facility];  Service: Urology;  Laterality: N/A;  Xenform graft  6x10   RIGHT URETEROSCOPIC STONE EXTRACTION / STENT PLACEMENT  03-13-2000   URETEROSCOPY Right 02/03/2014   Procedure: URETEROSCOPY WITH MANAGEMENT OF URETERAL STRICTURE;  Surgeon: Malka So, MD;  Location: [facility];  Service: Urology;  Laterality: Right;   VAGINAL HYSTERECTOMY  06/09/2011   Procedure: HYSTERECTOMY VAGINAL;  Surgeon: Sharene Butters, MD;  Location: [facility];  Service: Gynecology;  Laterality: N/A;   VAGINAL PROLAPSE REPAIR  06/09/2011   Procedure: VAGINAL VAULT SUSPENSION;  Surgeon: Reece Packer, MD;  Location: [facility];  Service: Urology;  Laterality: N/A;   VEIN LIGATION AND STRIPPING     LEFT LEG   Family History  Problem Relation Age of Onset   Colon cancer Mother 21   Colon polyps Father    Cancer Father        kidney, bladder    Colon polyps Brother    Coronary artery disease Other    Diabetes Other    Hyperlipidemia Other    Kidney disease Other    Cancer Other        lung   Esophageal cancer Neg Hx    Stomach cancer Neg Hx    Rectal cancer Neg Hx    Social History   Socioeconomic History   Marital status: Married    Spouse name: Not on file   Number of children: Not on file   Years of education: Not on file   Highest education level: Not on file  Occupational History   Not on file  Tobacco Use   Smoking status: Former    Types: Cigarettes   Smokeless tobacco: Never  Vaping Use   Vaping Use: Never used  Substance and Sexual Activity   Alcohol use: Yes    Comment: once a month   Drug use: No   Sexual activity: Not Currently    Partners: Male  Other Topics Concern   Not on file  Social History Narrative   Not on file   Social Determinants of Health   Financial Resource Strain: Low Risk  (10/28/2021)   Overall Financial Resource Strain (CARDIA)    Difficulty of Paying Living Expenses: Not hard at all  Food Insecurity: No Food Insecurity (10/28/2021)   Hunger Vital Sign    Worried About Running Out of Food in the  Last Year: Never true    Ran Out of Food in the Last Year: Never true  Transportation Needs: No Transportation Needs (10/28/2021)   PRAPARE - Hydrologist (Medical): No    Lack of Transportation (Non-Medical): No  Physical Activity: Inactive (10/28/2021)   Exercise Vital Sign    Days of Exercise per Week: 0 days    Minutes of Exercise per Session: 0 min  Stress: No Stress  Concern Present (10/28/2021)   [place]    Feeling of Stress : Not at all  Social Connections: [place] (10/28/2021)   Social Connection and Isolation Panel [NHANES]    Frequency of Communication with Friends and Family: More than three times a week    Frequency of Social Gatherings with Friends and Family: More than three times a week    Attends Religious Services: More than 4 times per year    Active Member of [employer] or Organizations: Yes    Attends Music therapist: More than 4 times per year    Marital Status: Married    Tobacco Counseling Counseling given: Not Answered   Clinical Intake:  Pre-visit preparation completed: No  Pain : No/denies pain     BMI - recorded: 34.3 Nutritional Status: BMI > 30  Obese Nutritional Risks: None Diabetes: No  How often do you need to have someone help you when you read instructions, pamphlets, or other written materials from your doctor or pharmacy?: 1 - Never  Diabetic?  No  Interpreter Needed?: No  Information entered by :: Rolene Arbour LPN   Activities of Daily Living    10/28/2021   10:40 AM  In your present state of health, do you have any difficulty performing the following activities:  Hearing? 0  Vision? 0  Difficulty concentrating or making decisions? 0  Walking or climbing stairs? 0  Dressing or bathing? 0  Doing errands, shopping? 0  Preparing Food and eating ? N  Using the Toilet? N  In the past six months, have you  accidently leaked urine? N  Do you have problems with loss of bowel control? N  Managing your Medications? N  Managing your Finances? N  Housekeeping or managing your Housekeeping? N    Patient Care Team: Laurey Morale, MD as PCP - General (Family Medicine)  Indicate any recent Medical Services you may have received from other than [facility] providers in the past year (date may be approximate).     Assessment:   This is a routine wellness examination for Arnold.  Hearing/Vision screen Hearing Screening - Comments:: Denies hearing difficulties   Vision Screening - Comments:: Wears rx glasses - up to date with routine eye exams with  Dr Delman Cheadle  Dietary issues and exercise activities discussed: Current Exercise Habits: The patient does not participate in regular exercise at present, Exercise limited by: None identified   Goals Addressed               This Visit's Progress     Patient Stated (pt-stated)        I would like to go to the [facility] Monday-Thursday to work out and walk more.       Depression Screen    10/28/2021   10:38 AM 10/18/2021   11:25 AM 08/22/2021    1:55 PM 12/31/2020   12:12 PM 10/06/2020   11:37 AM 10/06/2020   11:34 AM 09/15/2020    3:03 PM  PHQ 2/9 Scores  PHQ - 2 Score 0 0 0 0 0 0 1  PHQ- 9 Score 0 0 0 1       Fall Risk    10/28/2021   10:40 AM 10/18/2021   11:24 AM 08/22/2021    1:55 PM 12/31/2020   12:12 PM 10/06/2020   11:37 AM  Fall Risk   Falls in the past year? 1 0 0 0 0  Number falls in  past yr: 0 0 0 0 0  Injury with Fall? 1 0 0 0 0  Comment Patient stated Re injury to rt arm. No medical attention needed      Risk for fall due to : No Fall Risks No Fall Risks No Fall Risks No Fall Risks   Follow up Falls prevention discussed Falls evaluation completed Falls evaluation completed  Falls evaluation completed    [place]:  Any stairs in or around the home? No If so, are there any without handrails? No   Home free of loose throw rugs in walkways, pet beds, electrical cords, etc? Yes  Adequate lighting in your home to reduce risk of falls? Yes   ASSISTIVE DEVICES UTILIZED TO PREVENT FALLS:  Life alert? No  Use of a cane, walker or w/c? No  Grab bars in the bathroom? No  Shower chair or bench in shower? No  Elevated toilet seat or a handicapped toilet? No   TIMED UP AND GO:  Was the test performed? Yes .  Length of time to ambulate 10 feet: 10 sec.   Gait steady and fast without use of assistive device  Cognitive Function:        10/28/2021   10:45 AM  6CIT Screen  What Year? 0 points  What month? 0 points  What time? 0 points  Count back from 20 0 points  Months in reverse 0 points  Repeat phrase 0 points  Total Score 0 points    Immunizations Immunization History  Administered Date(s) Administered   Fluad Quad(high Dose 65+) 09/10/2018, 11/24/2019, 12/15/2020, 10/28/2021   Hepatitis B 05/08/2008   Influenza Split 11/23/2010, 01/12/2012, 12/11/2012, 09/19/2013, 12/22/2014, 09/29/2015, 09/21/2016, 10/05/2017, 09/10/2018, 11/24/2019   Influenza Whole 10/20/2008   Influenza,inj,Quad PF,6+ Mos 12/11/2012, 09/19/2013, 12/22/2014, 09/29/2015, 09/21/2016, 10/05/2017   Influenza-Unspecified 09/10/2015   MMR 09/09/1998   PFIZER(Purple Top)SARS-COV-2 Vaccination 02/18/2019, 03/13/2019, 10/30/2019   Pneumococcal Conjugate-13 10/02/2018   Tdap 07/22/2012    TDAP status: Up to date  Flu Vaccine status: Completed at today's visit  Pneumococcal vaccine status: Due, Education has been provided regarding the importance of this vaccine. Advised may receive this vaccine at local pharmacy or Health Dept. Aware to provide a copy of the vaccination record if obtained from local pharmacy or Health Dept. Verbalized acceptance and understanding.  Covid-19 vaccine status: Completed vaccines  Qualifies for Shingles Vaccine? Yes   Zostavax completed No   Shingrix Completed?: No.     Education has been provided regarding the importance of this vaccine. Patient has been advised to call insurance company to determine out of pocket expense if they have not yet received this vaccine. Advised may also receive vaccine at local pharmacy or Health Dept. Verbalized acceptance and understanding.  Screening Tests Health Maintenance  Topic Date Due   Hepatitis C Screening  Never done   DEXA SCAN  10/14/2017   COVID-19 Vaccine (4 - Pfizer series) 11/13/2021 (Originally 12/25/2019)   Zoster Vaccines- Shingrix (1 of 2) 01/28/2022 (Originally 10/15/2002)   Pneumonia Vaccine 56+ Years old (2 - PPSV23 or PCV20) 10/29/2022 (Originally 10/02/2019)   TETANUS/TDAP  07/23/2022   MAMMOGRAM  01/20/2023   COLONOSCOPY (Pts 45-22yr Insurance coverage will need to be confirmed)  04/27/2026   INFLUENZA VACCINE  Completed   HPV VACCINES  Aged Out    Health Maintenance  Health Maintenance Due  Topic Date Due   Hepatitis C Screening  Never done   DEXA SCAN  10/14/2017  Colorectal cancer screening: Type of screening: Colonoscopy. Completed 04/26/21. Repeat every 5 years  Mammogram status: Completed 01/19/21. Repeat every year  Bone Density status: Ordered 10/28/21. Pt provided with contact info and advised to call to schedule appt.  Lung Cancer Screening: (Low Dose CT Chest recommended if Age 19-80 years, 30 pack-year currently smoking OR have quit w/in 15years.) does not qualify.     Additional Screening:  Hepatitis C Screening: does qualify; Completed Deferred  Vision Screening: Recommended annual ophthalmology exams for early detection of glaucoma and other disorders of the eye. Is the patient up to date with their annual eye exam?  Yes  Who is the provider or what is the name of the office in which the patient attends annual eye exams? Dr Delman Cheadle If pt is not established with a provider, would they like to be referred to a provider to establish care? No .   Dental Screening:  Recommended annual dental exams for proper oral hygiene  Community Resource Referral / Chronic Care Management:  CRR required this visit?  No   CCM required this visit?  No      Plan:     I have personally reviewed and noted the following in the patient's chart:   Medical and social history Use of alcohol, tobacco or illicit drugs  Current medications and supplements including opioid prescriptions. Patient is not currently taking opioid prescriptions. Functional ability and status Nutritional status Physical activity Advanced directives List of other physicians Hospitalizations, surgeries, and ER visits in previous 12 months Vitals Screenings to include cognitive, depression, and falls Referrals and appointments  In addition, I have reviewed and discussed with patient certain preventive protocols, quality metrics, and best practice recommendations. A written personalized care plan for preventive services as well as general preventive health recommendations were provided to patient.     Criselda Peaches, LPN   [id number]   Nurse Notes: Patient due Hep-C Screening

## 2021-11-23 ENCOUNTER — Encounter: Payer: Self-pay | Admitting: Family

## 2021-11-23 ENCOUNTER — Ambulatory Visit (INDEPENDENT_AMBULATORY_CARE_PROVIDER_SITE_OTHER): Payer: Medicare Other | Admitting: Family

## 2021-11-23 VITALS — BP 141/78 | HR 88 | Temp 97.9°F | Ht 65.5 in | Wt 210.0 lb

## 2021-11-23 DIAGNOSIS — Z79899 Other long term (current) drug therapy: Secondary | ICD-10-CM | POA: Diagnosis not present

## 2021-11-23 DIAGNOSIS — E782 Mixed hyperlipidemia: Secondary | ICD-10-CM

## 2021-11-23 DIAGNOSIS — N3281 Overactive bladder: Secondary | ICD-10-CM | POA: Insufficient documentation

## 2021-11-23 DIAGNOSIS — K219 Gastro-esophageal reflux disease without esophagitis: Secondary | ICD-10-CM | POA: Diagnosis not present

## 2021-11-23 DIAGNOSIS — I83893 Varicose veins of bilateral lower extremities with other complications: Secondary | ICD-10-CM | POA: Diagnosis not present

## 2021-11-23 DIAGNOSIS — F411 Generalized anxiety disorder: Secondary | ICD-10-CM

## 2021-11-23 DIAGNOSIS — I8393 Asymptomatic varicose veins of bilateral lower extremities: Secondary | ICD-10-CM

## 2021-11-23 DIAGNOSIS — N952 Postmenopausal atrophic vaginitis: Secondary | ICD-10-CM | POA: Diagnosis not present

## 2021-11-23 DIAGNOSIS — F419 Anxiety disorder, unspecified: Secondary | ICD-10-CM

## 2021-11-23 DIAGNOSIS — E785 Hyperlipidemia, unspecified: Secondary | ICD-10-CM

## 2021-11-23 DIAGNOSIS — I10 Essential (primary) hypertension: Secondary | ICD-10-CM

## 2021-11-23 DIAGNOSIS — L84 Corns and callosities: Secondary | ICD-10-CM | POA: Diagnosis not present

## 2021-11-23 LAB — COMPREHENSIVE METABOLIC PANEL
ALT: 20 U/L (ref 0–35)
AST: 21 U/L (ref 0–37)
Albumin: 4.2 g/dL (ref 3.5–5.2)
Alkaline Phosphatase: 70 U/L (ref 39–117)
BUN: 15 mg/dL (ref 6–23)
CO2: 32 mEq/L (ref 19–32)
Calcium: 10.2 mg/dL (ref 8.4–10.5)
Chloride: 102 mEq/L (ref 96–112)
Creatinine, Ser: 0.77 mg/dL (ref 0.40–1.20)
GFR: 78.88 mL/min (ref >60.00)
Glucose, Bld: 84 mg/dL (ref 70–99)
Potassium: 4.2 mEq/L (ref 3.5–5.1)
Sodium: 138 mEq/L (ref 135–145)
Total Bilirubin: 0.4 mg/dL (ref 0.2–1.2)
Total Protein: 7 g/dL (ref 6.0–8.3)

## 2021-11-23 LAB — LIPID PANEL
Cholesterol: 224 mg/dL — ABNORMAL HIGH (ref 0–200)
HDL: 48 mg/dL (ref >39.00)
LDL Cholesterol: 154 mg/dL — ABNORMAL HIGH (ref 0–99)
NonHDL: 176.18
Total CHOL/HDL Ratio: 5
Triglycerides: 109 mg/dL (ref 0.0–149.0)
VLDL: 21.8 mg/dL (ref 0.0–40.0)

## 2021-11-23 MED ORDER — ESTRADIOL 0.1 MG/GM VA CREA: TOPICAL_CREAM | VAGINAL | 0 refills | Status: DC

## 2021-11-23 MED ORDER — OMEPRAZOLE 40 MG PO CPDR: 40.0000 mg | DELAYED_RELEASE_CAPSULE | Freq: Every day | ORAL | 5 refills | Status: DC | PRN

## 2021-11-23 MED ORDER — LORAZEPAM 0.5 MG PO TABS: 0.5000 mg | ORAL_TABLET | Freq: Three times a day (TID) | ORAL | 0 refills | Status: DC | PRN

## 2021-11-23 NOTE — Assessment & Plan Note (Addendum)
Has not needed lorazepam recently.  She does wish to keep rx on hand. Reviewed [place] controlled substance registry.  Pt signed controlled substance registry. UDS is ordered.

## 2021-11-23 NOTE — Assessment & Plan Note (Signed)
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 Uses [facility] prn.

## 2021-11-23 NOTE — Assessment & Plan Note (Signed)
Lesion removed gently with dermal curette, pt tolerated procedure without difficulty.

## 2021-11-23 NOTE — Assessment & Plan Note (Signed)
BP Readings from Last 3 Encounters:  11/23/21 (!) 141/78  10/28/21 120/60  10/18/21 124/80   Maintained on valsartan '40mg'$  once daily.

## 2021-11-23 NOTE — Progress Notes (Addendum)
Subjective:   By signing my name below, I, Shehryar Baig, attest that this documentation has been prepared under the direction and in the presence of Debbrah Alar, NP. 11/23/2021    Patient ID: Megan Crane, female    DOB: 17-Aug-1950, 71 y.o.   MRN: [id number]  Chief Complaint  Patient presents with   Establish Care    Sore on left foot  Check ears    HPI Patient is in today for a new patient visit.   Ulcer on left foot- She complains of a ulcer on her left foot for the past couple of months. She was walking and stepped on a splinter. Her friend tried removing it yesterday and pulled out a small piece but not the whole item.   Vericose veins- She has a history of vericose veins and had a procedure to strip them. She has occasional pain in her upper legs.   Kidney stone- She has a kidney stone in the past. She's only had one episode several years ago.   Blood pressure- She is taking 40 mg valsartan daily PO. Her blood pressure is doing well during this visit.  BP Readings from Last 3 Encounters:  11/23/21 (!) 141/78  10/28/21 120/60  10/18/21 124/80   Pulse Readings from Last 3 Encounters:  11/23/21 88  10/28/21 66  10/18/21 87   Overactive bladder- She is taking 5 mg vesicare PRN. She is seeing a urologist regularly to manage her symptoms. She reports having a sacropexy in the past.   Reflux- She has reflux and is managing it with her diet. Spicy foods and pecan nuts typically cause her symptoms.   Cholesterol- She has a history of high cholesterol but she is controlling it through her diet.   Anxiety- She takes 0.5 mg lorazepam PRN but has not taken it recently. She would like to continue taking it.   Vaginal cream- She has not taken estrace cream recently but is planning on taking it regularly.   Social history- She has 1 husband and 2 daughters. Only she and her husband live at their home.    Past Medical History:  Diagnosis Date   Anxiety disorder     hx vasovagal responses   Headache(784.0)    History of kidney stones    Hydronephrosis, right    Hyperlipidemia    Hypertension    Nephrolithiasis    Positive nasal culture for methicillin resistant Staphylococcus aureus    Right ureteral stone    Thrombophlebitis of leg, left, superficial 11/17/2020   Urinary incontinence    sees Dr. Wendy Poet     Past Surgical History:  Procedure Laterality Date   COLONOSCOPY  12/28/2015   per Dr. Havery Moros, serrated polyps and diverticula, repeat in 3 yrs    CYSTOSCOPY  06/09/2011   Procedure: CYSTOSCOPY;  Surgeon: Reece Packer, MD;  Location: [facility];  Service: Urology;  Laterality: N/A;   CYSTOSCOPY W/ URETERAL STENT PLACEMENT  02/03/2014   Procedure: CYSTOSCOPY WITH  RIGHT RETROGRADE PYELOGRAM/ RIGHT URETERAL STENT PLACEMENT;  Surgeon: Malka So, MD;  Location: [facility];  Service: Urology;;   CYSTOSCOPY WITH BIOPSY  02/03/2014   Procedure: CYSTOSCOPY WITH BIOPSY;  Surgeon: Malka So, MD;  Location: [facility];  Service: Urology;;   PILONIDAL CYST [place]  06/09/2011   Procedure: POSTERIOR REPAIR (RECTOCELE);  Surgeon: Reece Packer, MD;  Location: [facility];  Service: Urology;  Laterality: N/A;  Xenform graft 6x10   RIGHT URETEROSCOPIC STONE EXTRACTION / STENT PLACEMENT  03-13-2000   URETEROSCOPY Right 02/03/2014   Procedure: URETEROSCOPY WITH MANAGEMENT OF URETERAL STRICTURE;  Surgeon: Malka So, MD;  Location: [facility];  Service: Urology;  Laterality: Right;   VAGINAL HYSTERECTOMY  06/09/2011   Procedure: HYSTERECTOMY VAGINAL;  Surgeon: Sharene Butters, MD;  Location: [facility];  Service: Gynecology;  Laterality: N/A;   VAGINAL PROLAPSE REPAIR  06/09/2011   Procedure: VAGINAL VAULT SUSPENSION;  Surgeon: Reece Packer, MD;  Location: [facility];  Service: Urology;  Laterality: N/A;   VEIN LIGATION AND STRIPPING     LEFT LEG    Family History  Problem  Relation Age of Onset   Colon cancer Mother 35   Colon polyps Father    Cancer Father        kidney, bladder    Colon polyps Brother    Coronary artery disease Other    Diabetes Other    Hyperlipidemia Other    Kidney disease Other    Cancer Other        lung   Esophageal cancer Neg Hx    Stomach cancer Neg Hx    Rectal cancer Neg Hx     Social History   Socioeconomic History   Marital status: Married    Spouse name: Not on file   Number of children: Not on file   Years of education: Not on file   Highest education level: Not on file  Occupational History   Not on file  Tobacco Use   Smoking status: Former    Types: Cigarettes   Smokeless tobacco: Never  Vaping Use   Vaping Use: Never used  Substance and Sexual Activity   Alcohol use: Yes    Comment: once a month   Drug use: No   Sexual activity: Not Currently    Partners: Male  Other Topics Concern   Not on file  Social History Narrative   Married to a Truck driver   2 grown daughters   One daughter in [place]- no children, divorced   One daughter in [place], 3 children   [place] to [employer] in 2020, [place]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   Enjoys quilting/cross stitch, motor home, travelling   2 dogs    Best friend is Education administrator Gum   Social Determinants of Health   Financial Resource Strain: Low Risk  (10/28/2021)   Overall Financial Resource Strain (CARDIA)    Difficulty of Paying Living Expenses: Not hard at all  Food Insecurity: No Food Insecurity (10/28/2021)   Hunger Vital Sign    Worried About Running Out of Food in the Last Year: Never true    Ran Out of Food in the Last Year: Never true  Transportation Needs: No Transportation Needs (10/28/2021)   PRAPARE - Hydrologist (Medical): No    Lack of Transportation (Non-Medical): No  Physical Activity: Inactive (10/28/2021)   Exercise Vital Sign    Days of Exercise per Week: 0 days    Minutes of Exercise per Session:  0 min  Stress: No Stress Concern Present (10/28/2021)   [place]    Feeling of Stress : Not at all  Social Connections: [place] (10/28/2021)   Social Connection and Isolation Panel [NHANES]    Frequency of Communication with Friends and Family: More than three times a week  Frequency of Social Gatherings with Friends and Family: More than three times a week    Attends Religious Services: More than 4 times per year    Active Member of [employer] or Organizations: Yes    Attends Music therapist: More than 4 times per year    Marital Status: Married  Human resources officer Violence: Not At Risk (10/28/2021)   Humiliation, Afraid, Rape, and Kick questionnaire    Fear of Current or Ex-Partner: No    Emotionally Abused: No    Physically Abused: No    Sexually Abused: No    Outpatient Medications Prior to Visit  Medication Sig Dispense Refill   aspirin EC 81 MG tablet Take 81 mg by mouth daily. Swallow whole.     hydrochlorothiazide (HYDRODIURIL) 25 MG tablet Take 1 tablet (25 mg total) by mouth daily. 90 tablet 3   ketoconazole (NIZORAL) 2 % cream Apply 1 application topically daily. 60 g 2   Multiple Vitamins-Minerals (ONE-A-DAY WOMENS PO) Take by mouth.     solifenacin (VESICARE) 5 MG tablet Take 5 mg by mouth daily.     triamcinolone cream (KENALOG) 0.1 % Apply 1 Application topically 2 (two) times daily. 45 g 2   valsartan (DIOVAN) 40 MG tablet TAKE ONE TABLET BY MOUTH DAILY 90 tablet 3   vitamin C (ASCORBIC ACID) 500 MG tablet Take 500 mg by mouth once a week.     estradiol (ESTRACE VAGINAL) 0.1 MG/GM vaginal cream Place 1 Applicatorful vaginally at bedtime. 42.5 g 0   LORazepam (ATIVAN) 0.5 MG tablet Take 1 tablet (0.5 mg total) by mouth every 8 (eight) hours as needed for anxiety. 60 tablet 2   omeprazole (PRILOSEC) 40 MG capsule Take 1 capsule (40 mg total) by mouth daily. 30 capsule 5    sulfamethoxazole-trimethoprim (BACTRIM DS) 800-160 MG tablet Take 1 tablet by mouth 2 (two) times daily. 14 tablet 0   No facility-administered medications prior to visit.    Allergies  Allergen Reactions   Lidocaine Anaphylaxis, Shortness Of Breath and Other (See Comments)    "Eyes crossed"   Escitalopram     Sleepy   Levofloxacin Itching and Other (See Comments)    disoriented   Oxycodone-Aspirin Other (See Comments)    "VERY DIZZY"    Review of Systems  Musculoskeletal:        (+)ulcer on left foot       Objective:    Physical Exam Constitutional:      General: She is not in acute distress.    Appearance: Normal appearance. She is not ill-appearing.  HENT:     Head: Normocephalic and atraumatic.     Right Ear: External ear normal.     Left Ear: External ear normal.  Eyes:     Extraocular Movements: Extraocular movements intact.     Pupils: Pupils are equal, round, and reactive to light.  Cardiovascular:     Rate and Rhythm: Normal rate and regular rhythm.     Heart sounds: Normal heart sounds. No murmur heard.    No gallop.     Comments: Bilateral LE varicose veins Pulmonary:     Effort: Pulmonary effort is normal. No respiratory distress.     Breath sounds: Normal breath sounds. No wheezing or rales.  Musculoskeletal:     Right lower leg: No edema.     Left lower leg: No edema.  Skin:    General: Skin is warm and dry.     Comments: + corn left  lateral heel  Neurological:     Mental Status: She is alert and oriented to person, place, and time.  Psychiatric:        Judgment: Judgment normal.     BP (!) 141/78 (BP Location: Left Arm, Patient Position: Sitting, Cuff Size: Large)   Pulse 88   Temp 97.9 F (36.6 C) (Oral)   Ht 5' 5.5" (1.664 m)   Wt 210 lb (95.3 kg)   SpO2 100%   BMI 34.41 kg/m  Wt Readings from Last 3 Encounters:  11/23/21 210 lb (95.3 kg)  10/28/21 209 lb 4.8 oz (94.9 kg)  10/18/21 207 lb (93.9 kg)         Assessment & Plan:    Problem List Items Addressed This Visit       Unprioritized   Varicose veins of bilateral lower extremities with other complications    Recommended support hose. She would like to see vascular for consultation but not until after the winter.       OAB (overactive bladder)    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 Uses vesicare prn.        Hyperlipidemia    Lab Results  Component Value Date   CHOL 226 (H) 08/03/2020   HDL 46 (L) 08/03/2020   LDLCALC 150 (H) 08/03/2020   LDLDIRECT 150.0 12/02/2012   TRIG 164 (H) 08/03/2020   CHOLHDL 4.9 08/03/2020  Reinforced low cholesterol diet.        Relevant Orders   Comp Met (CMET)   Lipid panel   GERD (gastroesophageal reflux disease)    Stable with dietary changes.  Not on medication.        Relevant Medications   omeprazole (PRILOSEC) 40 MG capsule   Essential hypertension - Primary    BP Readings from Last 3 Encounters:  11/23/21 (!) 141/78  10/28/21 120/60  10/18/21 124/80  Maintained on valsartan 60m once daily.       Corn of foot    Lesion removed gently with dermal curette, pt tolerated procedure without difficulty.      Atrophic vaginitis    Continue estrace cream twice weekly.       Anxiety state    Has not needed lorazepam recently.  She does wish to keep rx on hand. Reviewed [place] controlled substance registry.  Pt signed controlled substance registry. UDS is ordered.       Relevant Medications   LORazepam (ATIVAN) 0.5 MG tablet   Other Visit Diagnoses     Varicose veins of both lower extremities, unspecified whether complicated       Anxiety       Relevant Medications   LORazepam (ATIVAN) 0.5 MG tablet   Other Relevant Orders   DRUG MONITORING, PANEL 8 WITH CONFIRMATION, URINE       45 minutes spent on today's visit. Time was spent interviewing patient, reviewing medical record.  Meds ordered this encounter  Medications   omeprazole (PRILOSEC) 40 MG capsule    Sig: Take 1 capsule (40 mg  total) by mouth daily as needed.    Dispense:  30 capsule    Refill:  5    Order Specific Question:   Supervising Provider    Answer:   BPenni HomansA [[id number]]   estradiol (ESTRACE VAGINAL) 0.1 MG/GM vaginal cream    Sig: Apply up to the #1 on applicator PV twice weekly    Dispense:  42.5 g    Refill:  0    Order Specific Question:  Supervising Provider    Answer:   Penni Homans A [[id number]]   LORazepam (ATIVAN) 0.5 MG tablet    Sig: Take 1 tablet (0.5 mg total) by mouth every 8 (eight) hours as needed for anxiety.    Dispense:  30 tablet    Refill:  0    Order Specific Question:   Supervising Provider    Answer:   Penni Homans A [[id number]]    I, Nance Pear, NP, personally preformed the services described in this documentation.  All medical record entries made by the scribe were at my direction and in my presence.  I have reviewed the chart and discharge instructions (if applicable) and agree that the record reflects my personal performance and is accurate and complete. 11/23/2021   I,Shehryar Baig,acting as a Education administrator for Nance Pear, NP.,have documented all relevant documentation on the behalf of Nance Pear, NP,as directed by  Nance Pear, NP while in the presence of Nance Pear, NP.   Nance Pear, NP

## 2021-11-23 NOTE — Assessment & Plan Note (Signed)
Recommended support hose. She would like to see vascular for consultation but not until after the winter.

## 2021-11-23 NOTE — Assessment & Plan Note (Signed)
Continue estrace cream twice weekly.

## 2021-11-23 NOTE — Assessment & Plan Note (Signed)
Lab Results  Component Value Date   CHOL 226 (H) 08/03/2020   HDL 46 (L) 08/03/2020   LDLCALC 150 (H) 08/03/2020   LDLDIRECT 150.0 12/02/2012   TRIG 164 (H) 08/03/2020   CHOLHDL 4.9 08/03/2020   Reinforced low cholesterol diet.

## 2021-11-23 NOTE — Assessment & Plan Note (Signed)
Stable with dietary changes.  Not on medication.

## 2021-11-24 ENCOUNTER — Encounter: Payer: Self-pay | Admitting: Family

## 2021-11-24 LAB — DRUG MONITORING, PANEL 8 WITH CONFIRMATION, URINE
6 Acetylmorphine: NEGATIVE ng/mL (ref <10)
Alcohol Metabolites: NEGATIVE ng/mL (ref <500)
Amphetamines: NEGATIVE ng/mL (ref <500)
Benzodiazepines: NEGATIVE ng/mL (ref <100)
Buprenorphine, Urine: NEGATIVE ng/mL (ref <5)
Cocaine Metabolite: NEGATIVE ng/mL (ref <150)
Creatinine: 29.2 mg/dL (ref >=20.0)
MDMA: NEGATIVE ng/mL (ref <500)
Marijuana Metabolite: NEGATIVE ng/mL (ref <20)
Opiates: NEGATIVE ng/mL (ref <100)
Oxidant: NEGATIVE ug/mL (ref <200)
Oxycodone: NEGATIVE ng/mL (ref <100)
pH: 7.5 (ref 4.5–9.0)

## 2021-11-24 LAB — DM TEMPLATE

## 2021-11-25 ENCOUNTER — Encounter: Payer: Self-pay | Admitting: Family

## 2021-11-29 NOTE — Progress Notes (Signed)
Mailed out to patient 

## 2021-12-07 DIAGNOSIS — Z85828 Personal history of other malignant neoplasm of skin: Secondary | ICD-10-CM | POA: Diagnosis not present

## 2021-12-07 DIAGNOSIS — C44311 Basal cell carcinoma of skin of nose: Secondary | ICD-10-CM | POA: Diagnosis not present

## 2022-01-10 ENCOUNTER — Telehealth: Payer: Self-pay | Admitting: Family Medicine

## 2022-01-10 NOTE — Telephone Encounter (Signed)
Pt is calling and would like to come back to dr fry and she said she will not leave again

## 2022-01-11 NOTE — Telephone Encounter (Signed)
I would be glad to see her again

## 2022-01-11 NOTE — Telephone Encounter (Signed)
Telephone note from 10/05/21 transfer of care to [facility]. Please advise

## 2022-01-12 NOTE — Telephone Encounter (Signed)
Pt has been scheduled for appointment on 01/13/22 per Dr Sarajane Jews

## 2022-01-13 ENCOUNTER — Emergency Department (HOSPITAL_BASED_OUTPATIENT_CLINIC_OR_DEPARTMENT_OTHER)
Admission: EM | Admit: 2022-01-13 | Discharge: 2022-01-13 | Disposition: A | Discharge status: Home / Self Care | Payer: Medicare Other | Attending: Emergency Medicine | Admitting: Emergency Medicine

## 2022-01-13 ENCOUNTER — Encounter (HOSPITAL_BASED_OUTPATIENT_CLINIC_OR_DEPARTMENT_OTHER): Payer: Self-pay

## 2022-01-13 ENCOUNTER — Other Ambulatory Visit: Payer: Self-pay

## 2022-01-13 ENCOUNTER — Emergency Department (HOSPITAL_BASED_OUTPATIENT_CLINIC_OR_DEPARTMENT_OTHER): Payer: Medicare Other

## 2022-01-13 DIAGNOSIS — R072 Precordial pain: Secondary | ICD-10-CM | POA: Diagnosis present

## 2022-01-13 DIAGNOSIS — B9789 Other viral agents as the cause of diseases classified elsewhere: Secondary | ICD-10-CM | POA: Diagnosis not present

## 2022-01-13 DIAGNOSIS — R509 Fever, unspecified: Secondary | ICD-10-CM | POA: Diagnosis not present

## 2022-01-13 DIAGNOSIS — Z1152 Encounter for screening for COVID-19: Secondary | ICD-10-CM | POA: Insufficient documentation

## 2022-01-13 DIAGNOSIS — R059 Cough, unspecified: Secondary | ICD-10-CM | POA: Diagnosis not present

## 2022-01-13 DIAGNOSIS — J069 Acute upper respiratory infection, unspecified: Secondary | ICD-10-CM | POA: Insufficient documentation

## 2022-01-13 DIAGNOSIS — R0789 Other chest pain: Secondary | ICD-10-CM | POA: Diagnosis not present

## 2022-01-13 DIAGNOSIS — I10 Essential (primary) hypertension: Secondary | ICD-10-CM | POA: Insufficient documentation

## 2022-01-13 DIAGNOSIS — R051 Acute cough: Secondary | ICD-10-CM

## 2022-01-13 LAB — CBC
HCT: 36.8 % (ref 36.0–46.0)
Hemoglobin: 12.4 g/dL (ref 12.0–15.0)
MCH: 30 pg (ref 26.0–34.0)
MCHC: 33.7 g/dL (ref 30.0–36.0)
MCV: 89.1 fL (ref 80.0–100.0)
Platelets: 209 10*3/uL (ref 150–400)
RBC: 4.13 MIL/uL (ref 3.87–5.11)
RDW: 13.6 % (ref 11.5–15.5)
WBC: 7.8 10*3/uL (ref 4.0–10.5)
nRBC: 0 % (ref 0.0–0.2)

## 2022-01-13 LAB — COMPREHENSIVE METABOLIC PANEL
ALT: 21 U/L (ref 0–44)
AST: 22 U/L (ref 15–41)
Albumin: 3.4 g/dL — ABNORMAL LOW (ref 3.5–5.0)
Alkaline Phosphatase: 65 U/L (ref 38–126)
Anion gap: 7 (ref 5–15)
BUN: 23 mg/dL (ref 8–23)
CO2: 25 mmol/L (ref 22–32)
Calcium: 9.1 mg/dL (ref 8.9–10.3)
Chloride: 100 mmol/L (ref 98–111)
Creatinine, Ser: 1.01 mg/dL — ABNORMAL HIGH (ref 0.44–1.00)
GFR, Estimated: >60 mL/min (ref >60)
Glucose, Bld: 130 mg/dL — ABNORMAL HIGH (ref 70–99)
Potassium: 3.7 mmol/L (ref 3.5–5.1)
Sodium: 132 mmol/L — ABNORMAL LOW (ref 135–145)
Total Bilirubin: 0.4 mg/dL (ref 0.3–1.2)
Total Protein: 6.8 g/dL (ref 6.5–8.1)

## 2022-01-13 LAB — TROPONIN I (HIGH SENSITIVITY): Troponin I (High Sensitivity): 7 ng/L (ref <18)

## 2022-01-13 LAB — RESP PANEL BY RT-PCR (RSV, FLU A&B, COVID)  RVPGX2
Influenza A by PCR: NEGATIVE
Influenza B by PCR: NEGATIVE
Resp Syncytial Virus by PCR: NEGATIVE
SARS Coronavirus 2 by RT PCR: NEGATIVE

## 2022-01-13 LAB — LIPASE, BLOOD: Lipase: 72 U/L — ABNORMAL HIGH (ref 11–51)

## 2022-01-13 NOTE — ED Provider Notes (Signed)
[facility] EMERGENCY DEPARTMENT Provider Note   CSN: [id number] Arrival date & time: 01/13/22  0108     History  Chief Complaint  Patient presents with   Cough   Abdominal Pain    Megan Crane is a 72 y.o. female.  The history is provided by the patient and the spouse.  Cough Abdominal Pain Associated symptoms: cough   Patient presents for multiple complaints. She reports since New Year's Eve she has been having a variety of complaints including cough, congestion and subjective fevers.  She also reports mild headache and ear pain.  She reports fatigue  she also reports episodes of back pain and arm pain and chest pain.  She also reports upper abdominal pain.  She reports tonight she woke up "scared for my life".  She reports she hurt all over in her chest and abdomen..    Past Medical History:  Diagnosis Date   Anxiety disorder    hx vasovagal responses   Headache(784.0)    History of kidney stones    Hydronephrosis, right    Hyperlipidemia    Hypertension    Nephrolithiasis    Positive nasal culture for methicillin resistant Staphylococcus aureus    Right ureteral stone    Thrombophlebitis of leg, left, superficial 11/17/2020   Urinary incontinence    sees Dr. McDiarmid     Home Medications Prior to Admission medications   Medication Sig Start Date End Date Taking? Authorizing Provider  aspirin EC 81 MG tablet Take 81 mg by mouth daily. Swallow whole.    [provider]  estradiol (ESTRACE VAGINAL) 0.1 MG/GM vaginal cream Apply up to the #1 on applicator PV twice weekly 11/23/21   Debbrah Alar, NP  hydrochlorothiazide (HYDRODIURIL) 25 MG tablet Take 1 tablet (25 mg total) by mouth daily. 06/14/21   Laurey Morale, MD  ketoconazole (NIZORAL) 2 % cream Apply 1 application topically daily. 01/14/21   Lorenda Peck, DPM  LORazepam (ATIVAN) 0.5 MG tablet Take 1 tablet (0.5 mg total) by mouth every 8 (eight) hours as needed for anxiety.  11/23/21   Debbrah Alar, NP  Multiple Vitamins-Minerals (ONE-A-DAY WOMENS PO) Take by mouth.    [provider]  omeprazole (PRILOSEC) 40 MG capsule Take 1 capsule (40 mg total) by mouth daily as needed. 11/23/21   Debbrah Alar, NP  solifenacin (VESICARE) 5 MG tablet Take 5 mg by mouth daily.    [provider]  triamcinolone cream (KENALOG) 0.1 % Apply 1 Application topically 2 (two) times daily. 08/22/21   Laurey Morale, MD  valsartan (DIOVAN) 40 MG tablet TAKE ONE TABLET BY MOUTH DAILY 06/03/21   Laurey Morale, MD  vitamin C (ASCORBIC ACID) 500 MG tablet Take 500 mg by mouth once a week.    [provider]      Allergies    Lidocaine, Escitalopram, Levofloxacin, and Oxycodone-aspirin    Review of Systems   Review of Systems  Respiratory:  Positive for cough.   Gastrointestinal:  Positive for abdominal pain.    Physical Exam Updated Vital Signs BP (!) 145/93 (BP Location: Right Arm)   Pulse 87   Temp 98.5 F (36.9 C) (Oral)   Resp 16   Ht 1.676 m ('5\' 6"'$ )   Wt 95.3 kg   SpO2 99%   BMI 33.89 kg/m  Physical Exam CONSTITUTIONAL: Well developed/well nourished, no distress HEAD: Normocephalic/atraumatic EYES: EOMI/PERRL ENMT: Mucous membranes moist NECK: supple no meningeal signs SPINE/BACK:entire spine nontender CV:  S1/S2 noted, no murmurs/rubs/gallops noted LUNGS: Lungs are clear to auscultation bilaterally, no apparent distress Chest-no tenderness ABDOMEN: soft, nontender, no rebound or guarding, bowel sounds noted throughout abdomen GU:no cva tenderness NEURO: Pt is awake/alert/appropriate, moves all extremitiesx4.  No facial droop.   EXTREMITIES: pulses normal/equal, full ROM, distal pulses equal and intact.  No calf tenderness or edema SKIN: warm, color normal PSYCH: no abnormalities of mood noted, alert and oriented to situation  ED Results / Procedures / Treatments   Labs (all labs ordered are listed, but only abnormal  results are displayed) Labs Reviewed  COMPREHENSIVE METABOLIC PANEL - Abnormal; Notable for the following components:      Result Value   Sodium 132 (*)    Glucose, Bld 130 (*)    Creatinine, Ser 1.01 (*)    Albumin 3.4 (*)    All other components within normal limits  LIPASE, BLOOD - Abnormal; Notable for the following components:   Lipase 72 (*)    All other components within normal limits  RESP PANEL BY RT-PCR (RSV, FLU A&B, COVID)  RVPGX2  CBC  TROPONIN I (HIGH SENSITIVITY)    EKG EKG Interpretation  Date/Time:  Friday January 13 2022 02:13:19 EST Ventricular Rate:  78 PR Interval:  150 QRS Duration: 90 QT Interval:  372 QTC Calculation: 424 R Axis:   62 Text Interpretation: Sinus rhythm Interpretation limited secondary to artifact Confirmed by Ripley Fraise [phone]) on 01/13/2022 2:20:07 AM  Radiology DG Chest Portable 1 View  Result Date: 01/13/2022 CLINICAL DATA:  Cough and fevers EXAM: PORTABLE CHEST 1 VIEW COMPARISON:  03/15/2017 FINDINGS: Cardiac shadow is mildly prominent but accentuated by the portable technique. Lungs are clear with the exception of mild scarring in the left base. No bony abnormality is noted. IMPRESSION: Mild scarring in the left base. No other focal abnormality is noted. Electronically Signed   By: Inez Catalina M.D.   On: 01/13/2022 01:47    Procedures Procedures    Medications Ordered in ED Medications - No data to display  ED Course/ Medical Decision Making/ A&P Clinical Course as of 01/13/22 0314  Ludwig Clarks Jan 13, 2022  0226 Patient presents for multiple complaints.  She reports upper respiratory symptoms, fatigue, headache, chest pain, abdominal pain.  She is in no acute distress.  Labs are pending at this time [DW]  0258 Glucose(!): 130 Mild hyperglycemia [DW]  0313 Patient resting comfortably, no acute distress, reading a book.  She reports she will get brief episodes of random chest pain that last for 1 second.  Vital signs have been  appropriate.  Heart rate and blood pressure and oxygenation are all unremarkable. [DW]  [phone] Patient is safe for discharge home.  She has PCP follow-up next week.  Suspect she has underlying viral URI that is driving her symptoms.  Lipase minimally elevated, but no focal abdominal tenderness at this time, no vomiting, low suspicion for acute pancreatitis [DW]    Clinical Course User Index [DW] Ripley Fraise, MD                           Medical Decision Making Amount and/or Complexity of Data Reviewed Labs: ordered. Decision-making details documented in ED Course. Radiology: ordered. ECG/medicine tests: ordered.   This patient presents to the ED for concern of cough, congestion, chest pain, abdominal pain, this involves an extensive number of treatment options, and is a complaint that carries with it a high risk of complications  and morbidity.  The differential diagnosis includes but is not limited to acute coronary syndrome, aortic dissection, pulmonary embolism, pericarditis, pneumothorax, pneumonia, myocarditis, pleurisy, esophageal rupture, cholecystitis, cholelithiasis, pancreatitis, gastritis, peptic ulcer disease, bowel obstruction, bowel perforation, diverticulitis, AAA, ischemic bowel    Comorbidities that complicate the patient evaluation: Patient's presentation is complicated by their history of hypertension   Additional history obtained: Additional history obtained from spouse   Lab Tests: I Ordered, and personally interpreted labs.  The pertinent results include: Mild hyperglycemia  Imaging Studies ordered: I ordered imaging studies including X-ray chest   I independently visualized and interpreted imaging which showed no acute findings I agree with the radiologist interpretation  Cardiac Monitoring: The patient was maintained on a cardiac monitor.  I personally viewed and interpreted the cardiac monitor which showed an underlying rhythm of:  sinus  rhythm  Reevaluation: After the interventions noted above, I reevaluated the patient and found that they have :stayed the same  Complexity of problems addressed: Patient's presentation is most consistent with  acute presentation with potential threat to life or bodily function  Disposition: After consideration of the diagnostic results and the patient's response to treatment,  I feel that the patent would benefit from discharge   .           Final Clinical Impression(s) / ED Diagnoses Final diagnoses:  Acute cough  Viral URI with cough  Precordial pain    Rx / DC Orders ED Discharge Orders     None         Ripley Fraise, MD 01/13/22 [phone]

## 2022-01-13 NOTE — Discharge Instructions (Signed)

## 2022-01-13 NOTE — ED Triage Notes (Signed)
Pt states beginning new years eve, epigastric pain, cough, runny nose, subjective fever. Last took tylenol at 11pm.  Headache, fatigue.

## 2022-01-17 ENCOUNTER — Encounter: Payer: Self-pay | Admitting: Family Medicine

## 2022-01-17 ENCOUNTER — Ambulatory Visit (INDEPENDENT_AMBULATORY_CARE_PROVIDER_SITE_OTHER): Payer: Medicare Other | Admitting: Family Medicine

## 2022-01-17 VITALS — BP 110/80 | HR 76 | Temp 98.1°F | Wt 212.0 lb

## 2022-01-17 DIAGNOSIS — J029 Acute pharyngitis, unspecified: Secondary | ICD-10-CM

## 2022-01-17 DIAGNOSIS — J020 Streptococcal pharyngitis: Secondary | ICD-10-CM

## 2022-01-17 LAB — POCT RAPID STREP A (OFFICE): Rapid Strep A Screen: POSITIVE — AB

## 2022-01-17 MED ORDER — CEFUROXIME AXETIL 500 MG PO TABS: 500.0000 mg | ORAL_TABLET | Freq: Two times a day (BID) | ORAL | 0 refills | Status: AC

## 2022-01-17 MED ORDER — FLUTICASONE PROPIONATE 50 MCG/ACT NA SUSP: 2.0000 | Freq: Every day | NASAL | 11 refills | Status: DC

## 2022-01-17 NOTE — Progress Notes (Signed)
   Subjective:    Patient ID: Megan Crane, female    DOB: 05/25/1950, 72 y.o.   MRN: [id number]  HPI Here to follow up on an ED visit on 01-13-22. At that time she had been having symptoms for several days of fatigue, brief intermittent sharp pains in the chest and back, a dry cough, and ST. No fever or SOB. That day she tested negative for strep, Covid, and flu. An EKG was normal. Her WBC was normal at 7.8. A portable CXR showed no acute processes. She was felt to have a viral URI, and she was told to drink fluids and take OTC as as needed. Since then her symptoms have continued as above. Still no fever or SOB. Today she tests positive for strep.    Review of Systems  Constitutional:  Positive for fatigue. Negative for diaphoresis and fever.  HENT:  Positive for congestion, postnasal drip and sore throat. Negative for ear pain and sinus pressure.   Eyes: Negative.   Respiratory:  Positive for cough. Negative for shortness of breath and wheezing.   Gastrointestinal: Negative.        Objective:   Physical Exam Constitutional:      Appearance: Normal appearance.  HENT:     Right Ear: Tympanic membrane, ear canal and external ear normal.     Left Ear: Tympanic membrane, ear canal and external ear normal.     Nose: Nose normal.     Mouth/Throat:     Pharynx: Oropharynx is clear.  Eyes:     Conjunctiva/sclera: Conjunctivae normal.  Cardiovascular:     Rate and Rhythm: Normal rate and regular rhythm.     Pulses: Normal pulses.     Heart sounds: Normal heart sounds.  Pulmonary:     Effort: Pulmonary effort is normal.     Breath sounds: Normal breath sounds.  Lymphadenopathy:     Cervical: No cervical adenopathy.  Neurological:     Mental Status: She is alert.           Assessment & Plan:  Strep pharyngitis, treat with 10 days of Cefuroxime. Recheck as needed.We spent a total of (  34 ) minutes reviewing records and discussing these issues.  Alysia Penna, MD

## 2022-01-25 DIAGNOSIS — Z1231 Encounter for screening mammogram for malignant neoplasm of breast: Secondary | ICD-10-CM | POA: Diagnosis not present

## 2022-01-25 LAB — HM MAMMOGRAPHY

## 2022-02-13 ENCOUNTER — Telehealth: Payer: Self-pay | Admitting: Family

## 2022-02-13 DIAGNOSIS — I83893 Varicose veins of bilateral lower extremities with other complications: Secondary | ICD-10-CM

## 2022-02-13 NOTE — Telephone Encounter (Signed)
Referral placed for vascular per patient's previous request.

## 2022-02-14 DIAGNOSIS — L820 Inflamed seborrheic keratosis: Secondary | ICD-10-CM | POA: Diagnosis not present

## 2022-02-14 DIAGNOSIS — Z85828 Personal history of other malignant neoplasm of skin: Secondary | ICD-10-CM | POA: Diagnosis not present

## 2022-02-14 DIAGNOSIS — L570 Actinic keratosis: Secondary | ICD-10-CM | POA: Diagnosis not present

## 2022-02-14 DIAGNOSIS — L720 Epidermal cyst: Secondary | ICD-10-CM | POA: Diagnosis not present

## 2022-03-01 ENCOUNTER — Ambulatory Visit (INDEPENDENT_AMBULATORY_CARE_PROVIDER_SITE_OTHER): Payer: Medicare Other | Admitting: Family

## 2022-03-01 VITALS — BP 131/75 | HR 103 | Temp 98.2°F | Resp 16 | Wt 213.0 lb

## 2022-03-01 DIAGNOSIS — R0789 Other chest pain: Secondary | ICD-10-CM | POA: Diagnosis not present

## 2022-03-01 DIAGNOSIS — I83893 Varicose veins of bilateral lower extremities with other complications: Secondary | ICD-10-CM | POA: Diagnosis not present

## 2022-03-01 DIAGNOSIS — R202 Paresthesia of skin: Secondary | ICD-10-CM | POA: Diagnosis not present

## 2022-03-01 DIAGNOSIS — G5603 Carpal tunnel syndrome, bilateral upper limbs: Secondary | ICD-10-CM

## 2022-03-01 DIAGNOSIS — M7989 Other specified soft tissue disorders: Secondary | ICD-10-CM | POA: Diagnosis not present

## 2022-03-01 LAB — B12 AND FOLATE PANEL
Folate: >23.9 ng/mL (ref >5.9)
Vitamin B-12: 333 pg/mL (ref 211–911)

## 2022-03-01 NOTE — Progress Notes (Signed)
Subjective:   By signing my name below, I, Jamey Reas, attest that this documentation has been prepared under the direction and in the presence of Debbrah Alar, NP. 03/01/2022   Patient ID: Megan Crane, female    DOB: 1950-03-14, 72 y.o.   MRN: [id number]  Chief Complaint  Patient presents with   Numbness    Complains of bilateral hand/ fingers numbness and tingling. Started about 3 days ago    HPI Patient is in today for an office visit.   Hands: Her hands feel numb and tingling. She has joint pain in her hands.    Lab Results  Component Value Date   [id number] 04/02/2020   Stress: She has been under an increased amount stress lately. When she gets upset she feels pain radiating up her chest and left arm. She also notes mild incontinence when she is upset. Last stress test in 2017. She has had counseling in the past and is interested in it.   Sleep: She is only sleeping 4-5 hours per night.   Social History: Rheumatoid arthritis runs in her family on her maternal side. Her mother passed when she was 24.   Diet: She does not regularly consume vegetables.     Past Medical History:  Diagnosis Date   Anxiety disorder    hx vasovagal responses   Headache(784.0)    History of kidney stones    Hydronephrosis, right    Hyperlipidemia    Hypertension    Nephrolithiasis    Positive nasal culture for methicillin resistant Staphylococcus aureus    Right ureteral stone    Thrombophlebitis of leg, left, superficial 11/17/2020   Urinary incontinence    sees Dr. Wendy Poet     Past Surgical History:  Procedure Laterality Date   COLONOSCOPY  12/28/2015   per Dr. Havery Moros, serrated polyps and diverticula, repeat in 3 yrs    CYSTOSCOPY  06/09/2011   Procedure: CYSTOSCOPY;  Surgeon: Reece Packer, MD;  Location: [facility];  Service: Urology;  Laterality: N/A;   CYSTOSCOPY W/ URETERAL STENT PLACEMENT  02/03/2014   Procedure: CYSTOSCOPY WITH  RIGHT RETROGRADE  PYELOGRAM/ RIGHT URETERAL STENT PLACEMENT;  Surgeon: Malka So, MD;  Location: [facility];  Service: Urology;;   CYSTOSCOPY WITH BIOPSY  02/03/2014   Procedure: CYSTOSCOPY WITH BIOPSY;  Surgeon: Malka So, MD;  Location: [facility];  Service: Urology;;   PILONIDAL CYST [place]  06/09/2011   Procedure: POSTERIOR REPAIR (RECTOCELE);  Surgeon: Reece Packer, MD;  Location: [facility];  Service: Urology;  Laterality: N/A;  Xenform graft 6x10   RIGHT URETEROSCOPIC STONE EXTRACTION / STENT PLACEMENT  03-13-2000   URETEROSCOPY Right 02/03/2014   Procedure: URETEROSCOPY WITH MANAGEMENT OF URETERAL STRICTURE;  Surgeon: Malka So, MD;  [facility];  Service: Urology;  Laterality: Right;   VAGINAL HYSTERECTOMY  06/09/2011   Procedure: HYSTERECTOMY VAGINAL;  Surgeon: Sharene Butters, MD;  Location: [facility];  Service: Gynecology;  Laterality: N/A;   VAGINAL PROLAPSE REPAIR  06/09/2011   Procedure: VAGINAL VAULT SUSPENSION;  Surgeon: Reece Packer, MD;  Location: [facility];  Service: Urology;  Laterality: N/A;   VEIN LIGATION AND STRIPPING     LEFT LEG    Family History  Problem Relation Age of Onset   Colon cancer Mother 82   Colon polyps Father    Cancer Father  kidney, bladder    Colon polyps Brother    Coronary artery disease Other    Diabetes Other    Hyperlipidemia Other    Kidney disease Other    Cancer Other        lung   Esophageal cancer Neg Hx    Stomach cancer Neg Hx    Rectal cancer Neg Hx     Social History   Socioeconomic History   Marital status: Married    Spouse name: Not on file   Number of children: Not on file   Years of education: Not on file   Highest education level: Not on file  Occupational History   Not on file  Tobacco Use   Smoking status: Former    Types: Cigarettes   Smokeless tobacco: Never  Vaping Use   Vaping Use: Never used  Substance and Sexual  Activity   Alcohol use: Yes    Comment: once a month   Drug use: No   Sexual activity: Not Currently    Partners: Male  Other Topics Concern   Not on file  Social History Narrative   Married to a Truck driver   2 grown daughters   One daughter in [place]- no children, divorced   One daughter in [place], 3 children   [place] to [employer] in 2020, [place]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   Enjoys quilting/cross stitch, motor home, travelling   2 dogs    Best friend is Education administrator Gum   Social Determinants of Health   Financial Resource Strain: Low Risk  (10/28/2021)   Overall Financial Resource Strain (CARDIA)    Difficulty of Paying Living Expenses: Not hard at all  Food Insecurity: No Food Insecurity (10/28/2021)   Hunger Vital Sign    Worried About Running Out of Food in the Last Year: Never true    [place] in the Last Year: Never true  Transportation Needs: No Transportation Needs (10/28/2021)   PRAPARE - Hydrologist (Medical): No    Lack of Transportation (Non-Medical): No  Physical Activity: Inactive (10/28/2021)   Exercise Vital Sign    Days of Exercise per Week: 0 days    Minutes of Exercise per Session: 0 min  Stress: No Stress Concern Present (10/28/2021)   [place]    Feeling of Stress : Not at all  Social Connections: [place] (10/28/2021)   Social Connection and Isolation Panel [NHANES]    Frequency of Communication with Friends and Family: More than three times a week    Frequency of Social Gatherings with Friends and Family: More than three times a week    Attends Religious Services: More than 4 times per year    Active Member of [employer] or Organizations: Yes    Attends Music therapist: More than 4 times per year    Marital Status: Married  Human resources officer Violence: Not At Risk (10/28/2021)   Humiliation, Afraid, Rape, and Kick  questionnaire    Fear of Current or Ex-Partner: No    Emotionally Abused: No    Physically Abused: No    Sexually Abused: No    Outpatient Medications Prior to Visit  Medication Sig Dispense Refill   aspirin EC 81 MG tablet Take 81 mg by mouth daily. Swallow whole.     estradiol (ESTRACE VAGINAL) 0.1 MG/GM vaginal cream Apply up to the #1 on applicator PV twice  weekly 42.5 g 0   fluticasone (FLONASE) 50 MCG/ACT nasal spray Place 2 sprays into both nostrils daily. 16 g 11   hydrochlorothiazide (HYDRODIURIL) 25 MG tablet Take 1 tablet (25 mg total) by mouth daily. 90 tablet 3   LORazepam (ATIVAN) 0.5 MG tablet Take 1 tablet (0.5 mg total) by mouth every 8 (eight) hours as needed for anxiety. 30 tablet 0   solifenacin (VESICARE) 5 MG tablet Take 5 mg by mouth daily.     valsartan (DIOVAN) 40 MG tablet TAKE ONE TABLET BY MOUTH DAILY 90 tablet 3   vitamin C (ASCORBIC ACID) 500 MG tablet Take 500 mg by mouth once a week.     ketoconazole (NIZORAL) 2 % cream Apply 1 application topically daily. 60 g 2   Multiple Vitamins-Minerals (ONE-A-DAY WOMENS PO) Take by mouth.     omeprazole (PRILOSEC) 40 MG capsule Take 1 capsule (40 mg total) by mouth daily as needed. 30 capsule 5   triamcinolone cream (KENALOG) 0.1 % Apply 1 Application topically 2 (two) times daily. 45 g 2   No facility-administered medications prior to visit.    Allergies  Allergen Reactions   Lidocaine Anaphylaxis, Shortness Of Breath and Other (See Comments)    "Eyes crossed"   Escitalopram     Sleepy   Levofloxacin Itching and Other (See Comments)    disoriented   Oxycodone-Aspirin Other (See Comments)    "VERY DIZZY"    Review of Systems  Cardiovascular:  Positive for chest pain.  Genitourinary:        (+) mild incontinence when stressed  Musculoskeletal:  Positive for joint pain (in hands).       (+) numbness in hands (+)left arm pain  Neurological:  Positive for tingling (in hands).  Psychiatric/Behavioral:          (+)stress       Objective:    Physical Exam Constitutional:      General: She is not in acute distress.    Appearance: Normal appearance.  HENT:     Head: Normocephalic and atraumatic.     Right Ear: External ear normal.     Left Ear: External ear normal.  Eyes:     Extraocular Movements: Extraocular movements intact.     Pupils: Pupils are equal, round, and reactive to light.  Cardiovascular:     Rate and Rhythm: Normal rate and regular rhythm.     Heart sounds: No murmur heard.    No gallop.  Pulmonary:     Effort: Pulmonary effort is normal. No respiratory distress.     Breath sounds: Normal breath sounds. No wheezing or rales.  Skin:    General: Skin is warm.  Neurological:     Mental Status: She is alert and oriented to person, place, and time.  Psychiatric:        Judgment: Judgment normal.     BP 131/75 (BP Location: Right Arm, Patient Position: Sitting, Cuff Size: Large)   Pulse (!) 103   Temp 98.2 F (36.8 C) (Oral)   Resp 16   Wt 213 lb (96.6 kg)   SpO2 100%   BMI 34.38 kg/m  Wt Readings from Last 3 Encounters:  03/01/22 213 lb (96.6 kg)  01/17/22 212 lb (96.2 kg)  01/13/22 210 lb (95.3 kg)       Assessment & Plan:  There are no diagnoses linked to this encounter.  I, Jamey Reas, personally preformed the services described in this documentation.  All medical record  entries made by the scribe were at my direction and in my presence.  I have reviewed the chart and discharge instructions (if applicable) and agree that the record reflects my personal performance and is accurate and complete. 03/01/2022   Lacretia Leigh as a scribe for Nance Pear, NP.,have documented all relevant documentation on the behalf of Nance Pear, NP,as directed by  Nance Pear, NP while in the presence of Nance Pear, NP.   Jamey Reas

## 2022-03-02 ENCOUNTER — Telehealth: Payer: Self-pay | Admitting: Family

## 2022-03-02 DIAGNOSIS — G5603 Carpal tunnel syndrome, bilateral upper limbs: Secondary | ICD-10-CM | POA: Insufficient documentation

## 2022-03-02 DIAGNOSIS — R0789 Other chest pain: Secondary | ICD-10-CM | POA: Insufficient documentation

## 2022-03-02 DIAGNOSIS — M7989 Other specified soft tissue disorders: Secondary | ICD-10-CM | POA: Insufficient documentation

## 2022-03-02 LAB — COMPREHENSIVE METABOLIC PANEL
ALT: 24 U/L (ref 0–35)
AST: 22 U/L (ref 0–37)
Albumin: 4.3 g/dL (ref 3.5–5.2)
Alkaline Phosphatase: 62 U/L (ref 39–117)
BUN: 23 mg/dL (ref 6–23)
CO2: 30 mEq/L (ref 19–32)
Calcium: 10.5 mg/dL (ref 8.4–10.5)
Chloride: 102 mEq/L (ref 96–112)
Creatinine, Ser: 0.81 mg/dL (ref 0.40–1.20)
GFR: 74.09 mL/min (ref >60.00)
Glucose, Bld: 108 mg/dL — ABNORMAL HIGH (ref 70–99)
Potassium: 4 mEq/L (ref 3.5–5.1)
Sodium: 140 mEq/L (ref 135–145)
Total Bilirubin: 0.4 mg/dL (ref 0.2–1.2)
Total Protein: 7 g/dL (ref 6.0–8.3)

## 2022-03-02 LAB — RHEUMATOID FACTOR: Rheumatoid fact SerPl-aCnc: <14 IU/mL (ref <14)

## 2022-03-02 MED ORDER — VITAMIN B-12 1000 MCG PO TABS: 1000.0000 ug | ORAL_TABLET | Freq: Every day | ORAL | Status: DC

## 2022-03-02 NOTE — Telephone Encounter (Signed)
B12 is low normal.  Please add b12 1061mg PO daily otc.

## 2022-03-02 NOTE — Assessment & Plan Note (Signed)
She is now ready to proceed with vascular referral.

## 2022-03-02 NOTE — Assessment & Plan Note (Signed)
Doubt cardiac etiology, but I do think formal cardiac evaluation would be a good idea.

## 2022-03-02 NOTE — Assessment & Plan Note (Signed)
Discussed limiting sodium in her diet.

## 2022-03-02 NOTE — Assessment & Plan Note (Signed)
Uncontrolled. Recommended that she purchase OTC wrist splints to wear at night and as able during the day. Let me know if symptoms do not improve in the coming weeks.

## 2022-03-03 NOTE — Telephone Encounter (Signed)
Patient advised of results and new supplements

## 2022-03-08 DIAGNOSIS — M9907 Segmental and somatic dysfunction of upper extremity: Secondary | ICD-10-CM | POA: Diagnosis not present

## 2022-03-08 DIAGNOSIS — M4723 Other spondylosis with radiculopathy, cervicothoracic region: Secondary | ICD-10-CM | POA: Diagnosis not present

## 2022-03-08 DIAGNOSIS — M25561 Pain in right knee: Secondary | ICD-10-CM | POA: Diagnosis not present

## 2022-03-08 DIAGNOSIS — M25511 Pain in right shoulder: Secondary | ICD-10-CM | POA: Diagnosis not present

## 2022-03-08 DIAGNOSIS — M9903 Segmental and somatic dysfunction of lumbar region: Secondary | ICD-10-CM | POA: Diagnosis not present

## 2022-03-08 DIAGNOSIS — M9901 Segmental and somatic dysfunction of cervical region: Secondary | ICD-10-CM | POA: Diagnosis not present

## 2022-03-08 DIAGNOSIS — M9906 Segmental and somatic dysfunction of lower extremity: Secondary | ICD-10-CM | POA: Diagnosis not present

## 2022-03-09 DIAGNOSIS — M25511 Pain in right shoulder: Secondary | ICD-10-CM | POA: Diagnosis not present

## 2022-03-09 DIAGNOSIS — M4723 Other spondylosis with radiculopathy, cervicothoracic region: Secondary | ICD-10-CM | POA: Diagnosis not present

## 2022-03-09 DIAGNOSIS — M9901 Segmental and somatic dysfunction of cervical region: Secondary | ICD-10-CM | POA: Diagnosis not present

## 2022-03-09 DIAGNOSIS — M9903 Segmental and somatic dysfunction of lumbar region: Secondary | ICD-10-CM | POA: Diagnosis not present

## 2022-03-09 DIAGNOSIS — M9907 Segmental and somatic dysfunction of upper extremity: Secondary | ICD-10-CM | POA: Diagnosis not present

## 2022-03-09 DIAGNOSIS — M9906 Segmental and somatic dysfunction of lower extremity: Secondary | ICD-10-CM | POA: Diagnosis not present

## 2022-03-09 DIAGNOSIS — M25561 Pain in right knee: Secondary | ICD-10-CM | POA: Diagnosis not present

## 2022-03-13 DIAGNOSIS — M9907 Segmental and somatic dysfunction of upper extremity: Secondary | ICD-10-CM | POA: Diagnosis not present

## 2022-03-13 DIAGNOSIS — M9906 Segmental and somatic dysfunction of lower extremity: Secondary | ICD-10-CM | POA: Diagnosis not present

## 2022-03-13 DIAGNOSIS — M9903 Segmental and somatic dysfunction of lumbar region: Secondary | ICD-10-CM | POA: Diagnosis not present

## 2022-03-13 DIAGNOSIS — M9901 Segmental and somatic dysfunction of cervical region: Secondary | ICD-10-CM | POA: Diagnosis not present

## 2022-03-13 DIAGNOSIS — M25561 Pain in right knee: Secondary | ICD-10-CM | POA: Diagnosis not present

## 2022-03-13 DIAGNOSIS — M25511 Pain in right shoulder: Secondary | ICD-10-CM | POA: Diagnosis not present

## 2022-03-13 DIAGNOSIS — M4723 Other spondylosis with radiculopathy, cervicothoracic region: Secondary | ICD-10-CM | POA: Diagnosis not present

## 2022-03-14 ENCOUNTER — Other Ambulatory Visit: Payer: Self-pay

## 2022-03-14 DIAGNOSIS — M9907 Segmental and somatic dysfunction of upper extremity: Secondary | ICD-10-CM | POA: Diagnosis not present

## 2022-03-14 DIAGNOSIS — M25511 Pain in right shoulder: Secondary | ICD-10-CM | POA: Diagnosis not present

## 2022-03-14 DIAGNOSIS — M9901 Segmental and somatic dysfunction of cervical region: Secondary | ICD-10-CM | POA: Diagnosis not present

## 2022-03-14 DIAGNOSIS — M9906 Segmental and somatic dysfunction of lower extremity: Secondary | ICD-10-CM | POA: Diagnosis not present

## 2022-03-14 DIAGNOSIS — M4723 Other spondylosis with radiculopathy, cervicothoracic region: Secondary | ICD-10-CM | POA: Diagnosis not present

## 2022-03-14 DIAGNOSIS — M9903 Segmental and somatic dysfunction of lumbar region: Secondary | ICD-10-CM | POA: Diagnosis not present

## 2022-03-14 DIAGNOSIS — I83893 Varicose veins of bilateral lower extremities with other complications: Secondary | ICD-10-CM

## 2022-03-14 DIAGNOSIS — M25561 Pain in right knee: Secondary | ICD-10-CM | POA: Diagnosis not present

## 2022-03-15 DIAGNOSIS — M4723 Other spondylosis with radiculopathy, cervicothoracic region: Secondary | ICD-10-CM | POA: Diagnosis not present

## 2022-03-15 DIAGNOSIS — M9906 Segmental and somatic dysfunction of lower extremity: Secondary | ICD-10-CM | POA: Diagnosis not present

## 2022-03-15 DIAGNOSIS — M9907 Segmental and somatic dysfunction of upper extremity: Secondary | ICD-10-CM | POA: Diagnosis not present

## 2022-03-15 DIAGNOSIS — M25511 Pain in right shoulder: Secondary | ICD-10-CM | POA: Diagnosis not present

## 2022-03-15 DIAGNOSIS — M9903 Segmental and somatic dysfunction of lumbar region: Secondary | ICD-10-CM | POA: Diagnosis not present

## 2022-03-15 DIAGNOSIS — M25561 Pain in right knee: Secondary | ICD-10-CM | POA: Diagnosis not present

## 2022-03-15 DIAGNOSIS — M9901 Segmental and somatic dysfunction of cervical region: Secondary | ICD-10-CM | POA: Diagnosis not present

## 2022-03-16 DIAGNOSIS — M25511 Pain in right shoulder: Secondary | ICD-10-CM | POA: Diagnosis not present

## 2022-03-16 DIAGNOSIS — M9903 Segmental and somatic dysfunction of lumbar region: Secondary | ICD-10-CM | POA: Diagnosis not present

## 2022-03-16 DIAGNOSIS — M25561 Pain in right knee: Secondary | ICD-10-CM | POA: Diagnosis not present

## 2022-03-16 DIAGNOSIS — M4723 Other spondylosis with radiculopathy, cervicothoracic region: Secondary | ICD-10-CM | POA: Diagnosis not present

## 2022-03-16 DIAGNOSIS — M9906 Segmental and somatic dysfunction of lower extremity: Secondary | ICD-10-CM | POA: Diagnosis not present

## 2022-03-16 DIAGNOSIS — M9901 Segmental and somatic dysfunction of cervical region: Secondary | ICD-10-CM | POA: Diagnosis not present

## 2022-03-16 DIAGNOSIS — M9907 Segmental and somatic dysfunction of upper extremity: Secondary | ICD-10-CM | POA: Diagnosis not present

## 2022-03-20 DIAGNOSIS — M4723 Other spondylosis with radiculopathy, cervicothoracic region: Secondary | ICD-10-CM | POA: Diagnosis not present

## 2022-03-20 DIAGNOSIS — M25511 Pain in right shoulder: Secondary | ICD-10-CM | POA: Diagnosis not present

## 2022-03-20 DIAGNOSIS — M9903 Segmental and somatic dysfunction of lumbar region: Secondary | ICD-10-CM | POA: Diagnosis not present

## 2022-03-20 DIAGNOSIS — M9907 Segmental and somatic dysfunction of upper extremity: Secondary | ICD-10-CM | POA: Diagnosis not present

## 2022-03-20 DIAGNOSIS — M9901 Segmental and somatic dysfunction of cervical region: Secondary | ICD-10-CM | POA: Diagnosis not present

## 2022-03-20 DIAGNOSIS — M25561 Pain in right knee: Secondary | ICD-10-CM | POA: Diagnosis not present

## 2022-03-20 DIAGNOSIS — M9906 Segmental and somatic dysfunction of lower extremity: Secondary | ICD-10-CM | POA: Diagnosis not present

## 2022-03-21 DIAGNOSIS — M25511 Pain in right shoulder: Secondary | ICD-10-CM | POA: Diagnosis not present

## 2022-03-21 DIAGNOSIS — M4723 Other spondylosis with radiculopathy, cervicothoracic region: Secondary | ICD-10-CM | POA: Diagnosis not present

## 2022-03-21 DIAGNOSIS — M9901 Segmental and somatic dysfunction of cervical region: Secondary | ICD-10-CM | POA: Diagnosis not present

## 2022-03-21 DIAGNOSIS — M9907 Segmental and somatic dysfunction of upper extremity: Secondary | ICD-10-CM | POA: Diagnosis not present

## 2022-03-21 DIAGNOSIS — M9903 Segmental and somatic dysfunction of lumbar region: Secondary | ICD-10-CM | POA: Diagnosis not present

## 2022-03-21 DIAGNOSIS — M25561 Pain in right knee: Secondary | ICD-10-CM | POA: Diagnosis not present

## 2022-03-21 DIAGNOSIS — M9906 Segmental and somatic dysfunction of lower extremity: Secondary | ICD-10-CM | POA: Diagnosis not present

## 2022-03-22 DIAGNOSIS — M25561 Pain in right knee: Secondary | ICD-10-CM | POA: Diagnosis not present

## 2022-03-22 DIAGNOSIS — M9903 Segmental and somatic dysfunction of lumbar region: Secondary | ICD-10-CM | POA: Diagnosis not present

## 2022-03-22 DIAGNOSIS — M4723 Other spondylosis with radiculopathy, cervicothoracic region: Secondary | ICD-10-CM | POA: Diagnosis not present

## 2022-03-22 DIAGNOSIS — M9901 Segmental and somatic dysfunction of cervical region: Secondary | ICD-10-CM | POA: Diagnosis not present

## 2022-03-22 DIAGNOSIS — M25511 Pain in right shoulder: Secondary | ICD-10-CM | POA: Diagnosis not present

## 2022-03-22 DIAGNOSIS — M9907 Segmental and somatic dysfunction of upper extremity: Secondary | ICD-10-CM | POA: Diagnosis not present

## 2022-03-22 DIAGNOSIS — M9906 Segmental and somatic dysfunction of lower extremity: Secondary | ICD-10-CM | POA: Diagnosis not present

## 2022-03-25 NOTE — Progress Notes (Unsigned)
  Cardiology Office Note:   Date:  03/29/2022  ID:  Megan Crane, Megan Crane 08-04-1950, MRN [id number]  History of Present Illness:   Megan Crane is a 71 y.o. female anxiety, HTN, and HLD who was referred by Debbrah Alar, NP for further evaluation of chest pain.  Patient seen by Ms. O'Sullivan on 03/01/22. Note reviewed. Reported episode of chest pain radiating to her left arm when she was upset. She is now referred to Cardiology for further evaluation.  Today, the patient states that in 01/2022 she had an episode of chest discomfort radiating to her back. She was seen in the ER where work-up including trop, ECG, and CXR reassuring. Two days later she was diagnosed with strep.  Since that time, she continues to have intermittent discomfort in the left breast/chest. Each episode lasts a couple of seconds and then abates. Also notes that when she gets upset, she can feel some discomfort in her arm/neck. Admits that she suffers from anxiety and thinks this may contribute to symptoms. She is otherwise active with no exertional symptoms. No SOB, orthopnea, PND, LE edema, or palpitations.  Father with CAD and ESRD in 3s.   ROS: As per HPI  Studies Reviewed:    EKG:  NSR, HR 77-personally reviewed  Cardiac Studies & Procedures     STRESS TESTS  MYOCARDIAL PERFUSION IMAGING 07/07/2015  Narrative  Nuclear stress EF: 68%.  There was no ST segment deviation noted during stress.  The study is normal.  This is a low risk study.  The left ventricular ejection fraction is hyperdynamic (>65%).  This is a normal pharmacologic nuclear study with no evidence of prior infarct or ischemia. Normal LVEF.              Risk Assessment/Calculations:              Physical Exam:   VS:  BP 124/86   Pulse 77   Ht 5\' 6"  (1.676 m)   Wt 214 lb (97.1 kg)   SpO2 98%   BMI 34.54 kg/m    Wt Readings from Last 3 Encounters:  03/29/22 214 lb (97.1 kg)  03/28/22 212 lb (96.2 kg)  03/01/22 213  lb (96.6 kg)     GEN: Well nourished, well developed in no acute distress NECK: No JVD; No carotid bruits CARDIAC: RRR, no murmurs, rubs, gallops RESPIRATORY:  Clear to auscultation without rales, wheezing or rhonchi  ABDOMEN: Soft, non-tender, non-distended EXTREMITIES:  No edema; No deformity   ASSESSMENT AND PLAN:   #Chest Pain: Patient reports episodes of chest pain intermittently that is not exertional in nature and mainly related to being upset or anxious. Last myoview 2017 without ischemia or infarct and normal EF. Symptoms atypical and unlikely cardiac in nature. Will check Ca score for risk stratification.  -Check Ca score  #HTN: -Continue HCTZ 25mg  daily -Continue valsartan 40mg  daily -Goal BP<130/90  #HLD: LDL 154 in 11/2021.  -Check Ca score -Start crestor 10mg  daily -Discussed lifestyle modifications        Signed, Freada Bergeron, MD

## 2022-03-28 ENCOUNTER — Ambulatory Visit
Admission: RE | Admit: 2022-03-28 | Discharge: 2022-03-28 | Disposition: A | Discharge status: Home / Self Care | Payer: Medicare Other | Source: Ambulatory Visit | Attending: Vascular Surgery | Admitting: Vascular Surgery

## 2022-03-28 ENCOUNTER — Ambulatory Visit: Payer: Medicare Other | Admitting: Physician Assistant

## 2022-03-28 VITALS — BP 129/88 | HR 95 | Temp 98.3°F | Resp 18 | Ht 66.0 in | Wt 212.0 lb

## 2022-03-28 DIAGNOSIS — R0789 Other chest pain: Secondary | ICD-10-CM | POA: Insufficient documentation

## 2022-03-28 DIAGNOSIS — I10 Essential (primary) hypertension: Secondary | ICD-10-CM | POA: Insufficient documentation

## 2022-03-28 DIAGNOSIS — E7800 Pure hypercholesterolemia, unspecified: Secondary | ICD-10-CM | POA: Diagnosis not present

## 2022-03-28 DIAGNOSIS — Z79899 Other long term (current) drug therapy: Secondary | ICD-10-CM | POA: Insufficient documentation

## 2022-03-28 DIAGNOSIS — I872 Venous insufficiency (chronic) (peripheral): Secondary | ICD-10-CM | POA: Diagnosis not present

## 2022-03-28 DIAGNOSIS — I83893 Varicose veins of bilateral lower extremities with other complications: Secondary | ICD-10-CM

## 2022-03-28 NOTE — Progress Notes (Unsigned)
[facility] [place]   Reason for referral: Varicose veins  History of Present Illness  Megan Crane is a 72 y.o. female who presents with chief complaint: swollen leg.  Patient notes, onset of swelling *** months ago, associated with ***.  The patient has had *** history of DVT, *** history of varicose vein, *** history of venous stasis ulcers, *** history of  Lymphedema and *** history of skin changes in lower legs.  There is *** family history of venous disorders.  The patient has *** used compression stockings in the past.  She has history of vein stripping  Past Medical History:  Diagnosis Date   Anxiety disorder    hx vasovagal responses   Headache(784.0)    History of kidney stones    Hydronephrosis, right    Hyperlipidemia    Hypertension    Nephrolithiasis    Positive nasal culture for methicillin resistant Staphylococcus aureus    Right ureteral stone    Thrombophlebitis of leg, left, superficial 11/17/2020   Urinary incontinence    sees Dr. Wendy Poet     Past Surgical History:  Procedure Laterality Date   COLONOSCOPY  12/28/2015   per Dr. Havery Moros, serrated polyps and diverticula, repeat in 3 yrs    CYSTOSCOPY  06/09/2011   Procedure: CYSTOSCOPY;  Surgeon: Reece Packer, MD;  Location: [facility];  Service: Urology;  Laterality: N/A;   CYSTOSCOPY W/ URETERAL STENT PLACEMENT  02/03/2014   Procedure: CYSTOSCOPY WITH  RIGHT RETROGRADE PYELOGRAM/ RIGHT URETERAL STENT PLACEMENT;  Surgeon: Malka So, MD;  Location: [facility];  Service: Urology;;   CYSTOSCOPY WITH BIOPSY  02/03/2014   Procedure: CYSTOSCOPY WITH BIOPSY;  Surgeon: Malka So, MD;  Location: [facility];  Service: Urology;;   PILONIDAL CYST [place]  06/09/2011   Procedure: POSTERIOR REPAIR (RECTOCELE);  Surgeon: Reece Packer, MD;  Location: [facility];  Service: Urology;  Laterality: N/A;  Xenform graft 6x10   RIGHT  URETEROSCOPIC STONE EXTRACTION / STENT PLACEMENT  03-13-2000   URETEROSCOPY Right 02/03/2014   Procedure: URETEROSCOPY WITH MANAGEMENT OF URETERAL STRICTURE;  Surgeon: Malka So, MD;  Location: [facility];  Service: Urology;  Laterality: Right;   VAGINAL HYSTERECTOMY  06/09/2011   Procedure: HYSTERECTOMY VAGINAL;  Surgeon: Sharene Butters, MD;  Location: [facility];  Service: Gynecology;  Laterality: N/A;   VAGINAL PROLAPSE REPAIR  06/09/2011   Procedure: VAGINAL VAULT SUSPENSION;  Surgeon: Reece Packer, MD;  Location: [facility];  Service: Urology;  Laterality: N/A;   VEIN LIGATION AND STRIPPING     LEFT LEG    Social History   Socioeconomic History   Marital status: Married    Spouse name: Not on file   Number of children: Not on file   Years of education: Not on file   Highest education level: Not on file  Occupational History   Not on file  Tobacco Use   Smoking status: Former    Types: Cigarettes   Smokeless tobacco: Never  Vaping Use   Vaping Use: Never used  Substance and Sexual Activity   Alcohol use: Yes    Comment: once a month   Drug use: No   Sexual activity: Not Currently    Partners: Male  Other Topics Concern   Not on file  Social History Narrative   Married to a Truck driver   2 grown daughters   One  daughter in [place]- no children, divorced   One daughter in [place], 3 children   [place] to [employer] in 2020, [place]   Enjoys quilting/cross stitch, motor home, travelling   2 dogs    Best friend is Jackelyn Poling Gum   Social Determinants of Health   Financial Resource Strain: Low Risk  (10/28/2021)   Overall Financial Resource Strain (CARDIA)    Difficulty of Paying Living Expenses: Not hard at all  Food Insecurity: No Food Insecurity (10/28/2021)   Hunger Vital Sign    Worried About Running Out of Food in the Last Year: Never true    [place] in the Last Year: Never true  Transportation Needs: No  Transportation Needs (10/28/2021)   PRAPARE - Hydrologist (Medical): No    Lack of Transportation (Non-Medical): No  Physical Activity: Inactive (10/28/2021)   Exercise Vital Sign    Days of Exercise per Week: 0 days    Minutes of Exercise per Session: 0 min  Stress: No Stress Concern Present (10/28/2021)   [place]    Feeling of Stress : Not at all  Social Connections: [place] (10/28/2021)   Social Connection and Isolation Panel [NHANES]    Frequency of Communication with Friends and Family: More than three times a week    Frequency of Social Gatherings with Friends and Family: More than three times a week    Attends Religious Services: More than 4 times per year    Active Member of [employer] or Organizations: Yes    Attends Music therapist: More than 4 times per year    Marital Status: Married  Human resources officer Violence: Not At Risk (10/28/2021)   Humiliation, Afraid, Rape, and Kick questionnaire    Fear of Current or Ex-Partner: No    Emotionally Abused: No    Physically Abused: No    Sexually Abused: No    Family History  Problem Relation Age of Onset   Colon cancer Mother 20   Colon polyps Father    Cancer Father        kidney, bladder    Colon polyps Brother    Coronary artery disease Other    Diabetes Other    Hyperlipidemia Other    Kidney disease Other    Cancer Other        lung   Esophageal cancer Neg Hx    Stomach cancer Neg Hx    Rectal cancer Neg Hx     Current Outpatient Medications on File Prior to Visit  Medication Sig Dispense Refill   aspirin EC 81 MG tablet Take 81 mg by mouth daily. Swallow whole.     cyanocobalamin (VITAMIN B12) 1000 MCG tablet Take 1 tablet (1,000 mcg total) by mouth daily.     estradiol (ESTRACE VAGINAL) 0.1 MG/GM vaginal cream Apply up to the #1 on applicator PV twice weekly 42.5 g 0   fluticasone (FLONASE)  50 MCG/ACT nasal spray Place 2 sprays into both nostrils daily. 16 g 11   hydrochlorothiazide (HYDRODIURIL) 25 MG tablet Take 1 tablet (25 mg total) by mouth daily. 90 tablet 3   LORazepam (ATIVAN) 0.5 MG tablet Take 1 tablet (0.5 mg total) by mouth every 8 (eight) hours as needed for anxiety. 30 tablet 0   solifenacin (VESICARE) 5 MG tablet Take 5 mg by mouth daily.     valsartan (DIOVAN)  40 MG tablet TAKE ONE TABLET BY MOUTH DAILY 90 tablet 3   vitamin C (ASCORBIC ACID) 500 MG tablet Take 500 mg by mouth once a week.     No current facility-administered medications on file prior to visit.    Allergies as of 03/28/2022 - Review Complete 03/28/2022  Allergen Reaction Noted   Lidocaine Anaphylaxis, Shortness Of Breath, and Other (See Comments) 08/31/2008   Escitalopram  09/08/2010   Levofloxacin Itching and Other (See Comments) 05/04/2011   Oxycodone-aspirin Other (See Comments) 09/08/2010     ROS:   General:  No weight loss, Fever, chills  HEENT: No recent headaches, no nasal bleeding, no visual changes, no sore throat  Neurologic: No dizziness, blackouts, seizures. No recent symptoms of stroke or mini- stroke. No recent episodes of slurred speech, or temporary blindness.  Cardiac: No recent episodes of chest pain/pressure, no shortness of breath at rest.  No shortness of breath with exertion.  Denies history of atrial fibrillation or irregular heartbeat  Vascular: No history of rest pain in feet.  No history of claudication.  No history of non-healing ulcer, No history of DVT   Pulmonary: No home oxygen, no productive cough, no hemoptysis,  No asthma or wheezing  Musculoskeletal:  [ ]  Arthritis, [ ]  Low back pain,  [ ]  Joint pain  Hematologic:No history of hypercoagulable state.  No history of easy bleeding.  No history of anemia  Gastrointestinal: No hematochezia or melena,  No gastroesophageal reflux, no trouble swallowing  Urinary: [ ]  chronic Kidney disease, [ ]  on HD - [  ] MWF or [ ]  TTHS, [ ]  Burning with urination, [ ]  Frequent urination, [ ]  Difficulty urinating;   Skin: No rashes  Psychological: No history of anxiety,  No history of depression  Physical Examination  Vitals:   03/28/22 1454  BP: 129/88  Pulse: 95  Resp: 18  Temp: 98.3 F (36.8 C)  TempSrc: Temporal  SpO2: 100%  Weight: 212 lb (96.2 kg)  Height: 5\' 6"  (1.676 m)    Body mass index is 34.22 kg/m.  General:  Alert and oriented, no acute distress HEENT: Normal Neck: No bruit or JVD Pulmonary: Clear to auscultation bilaterally Cardiac: Regular Rate and Rhythm without murmur Abdomen: Soft, non-tender, non-distended, no mass, no scars Skin: No rash     Extremity Pulses:  2+ radial, brachial, femoral, dorsalis pedis, posterior tibial pulses bilaterally Musculoskeletal: No deformity or edema  Neurologic: Upper and lower extremity motor 5/5 and symmetric  DATA:  Assessment:  Plan:  Ruta Hinds, MD [facility] Office: [phone] Pager: [phone]

## 2022-03-29 ENCOUNTER — Ambulatory Visit (INDEPENDENT_AMBULATORY_CARE_PROVIDER_SITE_OTHER): Payer: Medicare Other | Admitting: Cardiology

## 2022-03-29 ENCOUNTER — Encounter: Payer: Self-pay | Admitting: Cardiology

## 2022-03-29 VITALS — BP 124/86 | HR 77 | Ht 66.0 in | Wt 214.0 lb

## 2022-03-29 DIAGNOSIS — I10 Essential (primary) hypertension: Secondary | ICD-10-CM | POA: Diagnosis not present

## 2022-03-29 DIAGNOSIS — E7800 Pure hypercholesterolemia, unspecified: Secondary | ICD-10-CM

## 2022-03-29 DIAGNOSIS — Z79899 Other long term (current) drug therapy: Secondary | ICD-10-CM

## 2022-03-29 DIAGNOSIS — R0789 Other chest pain: Secondary | ICD-10-CM

## 2022-03-29 MED ORDER — ROSUVASTATIN CALCIUM 10 MG PO TABS: 10.0000 mg | ORAL_TABLET | Freq: Every day | ORAL | 2 refills | Status: DC

## 2022-03-29 NOTE — Patient Instructions (Signed)
Medication Instructions:   START TAKING ROSUVASTATIN (CRESTOR) 10 MG BY MOUTH DAILY  *If you need a refill on your cardiac medications before your next appointment, please call your pharmacy*   Lab Work:  IN 8 WEEKS HERE IN THE OFFICE--CHECK LIPIDS--PLEASE COME FASTING TO THIS LAB APPOINTMENT  If you have labs (blood work) drawn today and your tests are completely normal, you will receive your results only by: MyChart Message (if you have MyChart) OR A paper copy in the mail If you have any lab test that is abnormal or we need to change your treatment, we will call you to review the results.   Testing/Procedures:  CARDIAC CALCIUM SCORE (SELF PAY)    Follow-Up: At [facility], you and your health needs are our priority.  As part of our continuing mission to provide you with exceptional heart care, we have created designated Provider Care Teams.  These Care Teams include your primary Cardiologist (physician) and Advanced Practice Providers (APPs -  Physician Assistants and Nurse Practitioners) who all work together to provide you with the care you need, when you need it.  We recommend signing up for the patient portal called "MyChart".  Sign up information is provided on this After Visit Summary.  MyChart is used to connect with patients for Virtual Visits (Telemedicine).  Patients are able to view lab/test results, encounter notes, upcoming appointments, etc.  Non-urgent messages can be sent to your provider as well.   To learn more about what you can do with MyChart, go to [email or web address].    Your next appointment:   1 year(s)  Provider:   DR. PEMBERTON   

## 2022-03-30 ENCOUNTER — Encounter: Payer: Self-pay | Admitting: Physician Assistant

## 2022-04-03 ENCOUNTER — Ambulatory Visit (HOSPITAL_BASED_OUTPATIENT_CLINIC_OR_DEPARTMENT_OTHER)
Admission: RE | Admit: 2022-04-03 | Discharge: 2022-04-03 | Disposition: A | Discharge status: Home / Self Care | Payer: Medicare Other | Source: Ambulatory Visit | Attending: Cardiology | Admitting: Cardiology

## 2022-04-03 DIAGNOSIS — I10 Essential (primary) hypertension: Secondary | ICD-10-CM | POA: Insufficient documentation

## 2022-04-03 DIAGNOSIS — E7800 Pure hypercholesterolemia, unspecified: Secondary | ICD-10-CM | POA: Insufficient documentation

## 2022-04-03 DIAGNOSIS — R0789 Other chest pain: Secondary | ICD-10-CM | POA: Insufficient documentation

## 2022-04-03 DIAGNOSIS — Z79899 Other long term (current) drug therapy: Secondary | ICD-10-CM | POA: Insufficient documentation

## 2022-04-04 DIAGNOSIS — H2513 Age-related nuclear cataract, bilateral: Secondary | ICD-10-CM | POA: Diagnosis not present

## 2022-04-04 DIAGNOSIS — H47323 Drusen of optic disc, bilateral: Secondary | ICD-10-CM | POA: Diagnosis not present

## 2022-04-04 DIAGNOSIS — H5203 Hypermetropia, bilateral: Secondary | ICD-10-CM | POA: Diagnosis not present

## 2022-04-14 DIAGNOSIS — L720 Epidermal cyst: Secondary | ICD-10-CM | POA: Diagnosis not present

## 2022-04-14 DIAGNOSIS — L814 Other melanin hyperpigmentation: Secondary | ICD-10-CM | POA: Diagnosis not present

## 2022-04-14 DIAGNOSIS — L821 Other seborrheic keratosis: Secondary | ICD-10-CM | POA: Diagnosis not present

## 2022-04-14 DIAGNOSIS — D225 Melanocytic nevi of trunk: Secondary | ICD-10-CM | POA: Diagnosis not present

## 2022-04-14 DIAGNOSIS — L570 Actinic keratosis: Secondary | ICD-10-CM | POA: Diagnosis not present

## 2022-04-14 DIAGNOSIS — L578 Other skin changes due to chronic exposure to nonionizing radiation: Secondary | ICD-10-CM | POA: Diagnosis not present

## 2022-04-24 ENCOUNTER — Other Ambulatory Visit: Payer: Medicare Other

## 2022-05-22 ENCOUNTER — Telehealth: Payer: Self-pay | Admitting: Cardiology

## 2022-05-22 NOTE — Telephone Encounter (Signed)
Pt is calling to say she never started taking her crestor as advised at last OV back in March.  She states she wants to get her lipids checked (as scheduled for 5/15) at our office first, reassess her numbers at that time, then further proceed with taking the crestor if needed.   Pt wanted to make Dr. Shari Prows aware of this.   Informed the pt that would be fine that we get her lipids checked on Wed 5/15 and reassess the need at that time if crestor should be started or not.  Went over her last LDL/Lipids with her back in Nov 2023.  Pt education provided.  Pt is still insisting that she get the lipids rechecked on Wed 5/15 and reassess at that time.   Pt verbalized understanding and agrees with this plan.  Advised her to come fasting to the lab appt.

## 2022-05-22 NOTE — Telephone Encounter (Signed)
Pt c/o medication issue:  1. Name of Medication: rosuvastatin (CRESTOR) 10 MG tablet   2. How are you currently taking this medication (dosage and times per day)?   Take 1 tablet (10 mg total) by mouth daily.    3. Are you having a reaction (difficulty breathing--STAT)? no  4. What is your medication issue? Patient would like to speak to nurse about this medication.

## 2022-05-23 DIAGNOSIS — M170 Bilateral primary osteoarthritis of knee: Secondary | ICD-10-CM | POA: Diagnosis not present

## 2022-05-24 ENCOUNTER — Ambulatory Visit: Payer: Medicare Other | Attending: Cardiology

## 2022-05-24 DIAGNOSIS — E7800 Pure hypercholesterolemia, unspecified: Secondary | ICD-10-CM | POA: Diagnosis not present

## 2022-05-24 DIAGNOSIS — I10 Essential (primary) hypertension: Secondary | ICD-10-CM

## 2022-05-24 DIAGNOSIS — R0789 Other chest pain: Secondary | ICD-10-CM | POA: Diagnosis not present

## 2022-05-24 DIAGNOSIS — Z79899 Other long term (current) drug therapy: Secondary | ICD-10-CM | POA: Diagnosis not present

## 2022-05-24 LAB — LIPID PANEL
Chol/HDL Ratio: 4.6 ratio — ABNORMAL HIGH (ref 0.0–4.4)
Cholesterol, Total: 227 mg/dL — ABNORMAL HIGH (ref 100–199)
HDL: 49 mg/dL (ref >39)
LDL Chol Calc (NIH): 157 mg/dL — ABNORMAL HIGH (ref 0–99)
Triglycerides: 117 mg/dL (ref 0–149)
VLDL Cholesterol Cal: 21 mg/dL (ref 5–40)

## 2022-05-25 ENCOUNTER — Telehealth: Payer: Self-pay | Admitting: *Deleted

## 2022-05-25 DIAGNOSIS — E7800 Pure hypercholesterolemia, unspecified: Secondary | ICD-10-CM

## 2022-05-25 NOTE — Telephone Encounter (Signed)
-----   Message from Loa Socks, LPN sent at 1/61/0960  7:21 AM EDT ----- Please review whole note and advise  ----- Message ----- From: Meriam Sprague, MD Sent: 05/24/2022   8:52 PM EDT To: Loa Socks, LPN  Cholesterol is elevated. IS she taking the crestor?

## 2022-05-25 NOTE — Telephone Encounter (Signed)
  Loa Socks, LPN 1/61/0960  4:54 PM EDT Back to Top    Left message for the pt to call back for results.   Loa Socks, LPN 0/98/1191  4:78 PM EDT     Spoke with Dr. Shari Prows about this pts results and telephone note from earlier this week where pt voiced not wanting to start a statin.     Per Dr. Shari Prows, she can continue her current regimen and with lifestyle modification or start zetia 10 mg po daily to decrease the LDL.   Loa Socks, LPN 2/95/6213  0:86 AM EDT     Dr. Shari Prows, copied below is from this weeks telephone note about this issue:   Pt is calling to say she never started taking her crestor as advised at last OV back in March.   She states she wants to get her lipids checked (as scheduled for 5/15) at our office first, reassess her numbers at that time, then further proceed with taking the crestor if needed.   Pt wanted to make Dr. Shari Prows aware of this.   Informed the pt that would be fine that we get her lipids checked on Wed 5/15 and reassess the need at that time if crestor should be started or not.   Went over her last LDL/Lipids with her back in Nov 2023.  Pt education provided.   Pt is still insisting that she get the lipids rechecked on Wed 5/15 and reassess at that time.   Pt verbalized understanding and agrees with this plan.  Advised her to come fasting to the lab appt.   Meriam Sprague, MD 05/24/2022  8:52 PM EDT     Cholesterol is elevated. IS she taking the crestor?

## 2022-05-26 MED ORDER — EZETIMIBE 10 MG PO TABS: 10.0000 mg | ORAL_TABLET | Freq: Every day | ORAL | 3 refills | Status: DC

## 2022-05-26 NOTE — Telephone Encounter (Signed)
Returned call to patient.  Explained to patient and she repeated back the lab values.  All questions/concerns re: zetia addressed.   She is willing to try the Zetia.  She went to ortho last week and is doing PT [facility]      Scheduled follow up lipids 08/22/22.

## 2022-05-26 NOTE — Addendum Note (Signed)
Addended by: Lendon Ka on: 05/26/2022 02:02 PM   Modules accepted: Orders

## 2022-05-26 NOTE — Telephone Encounter (Signed)
Pt returning call

## 2022-06-12 DIAGNOSIS — M1712 Unilateral primary osteoarthritis, left knee: Secondary | ICD-10-CM | POA: Diagnosis not present

## 2022-06-12 DIAGNOSIS — M1711 Unilateral primary osteoarthritis, right knee: Secondary | ICD-10-CM | POA: Diagnosis not present

## 2022-06-14 ENCOUNTER — Telehealth: Payer: Self-pay | Admitting: Family

## 2022-06-14 NOTE — Telephone Encounter (Signed)
Patient has called for med refill request   Hydrochlorothiazide 25 mg Valsartan 40 mg   Pharmacy-  [facility] [id number] - [place], [place] - [address]

## 2022-06-15 NOTE — Telephone Encounter (Signed)
Pt was calling to check on the status of her request. She is hoping to get this before she runs out. She stated this if she needs an appt that's fine but would like a partial refill if possible.

## 2022-06-16 MED ORDER — VALSARTAN 40 MG PO TABS: 40.0000 mg | ORAL_TABLET | Freq: Every day | ORAL | 0 refills | Status: DC

## 2022-06-16 MED ORDER — HYDROCHLOROTHIAZIDE 25 MG PO TABS: 25.0000 mg | ORAL_TABLET | Freq: Every day | ORAL | 0 refills | Status: DC

## 2022-06-16 NOTE — Telephone Encounter (Signed)
Medication refills sent Please advise when she needs to come back.

## 2022-06-16 NOTE — Telephone Encounter (Signed)
Mid August please.

## 2022-06-19 NOTE — Telephone Encounter (Signed)
Patient scheduled to come back in August

## 2022-06-22 DIAGNOSIS — L649 Androgenic alopecia, unspecified: Secondary | ICD-10-CM | POA: Diagnosis not present

## 2022-06-22 DIAGNOSIS — Z85828 Personal history of other malignant neoplasm of skin: Secondary | ICD-10-CM | POA: Diagnosis not present

## 2022-06-22 DIAGNOSIS — L570 Actinic keratosis: Secondary | ICD-10-CM | POA: Diagnosis not present

## 2022-06-23 ENCOUNTER — Other Ambulatory Visit: Payer: Self-pay | Admitting: Family Medicine

## 2022-06-27 ENCOUNTER — Ambulatory Visit: Payer: Medicare Other | Admitting: Family

## 2022-06-30 ENCOUNTER — Ambulatory Visit (INDEPENDENT_AMBULATORY_CARE_PROVIDER_SITE_OTHER): Payer: Medicare Other | Admitting: Family

## 2022-06-30 VITALS — BP 126/68 | HR 88 | Temp 98.3°F | Wt 214.0 lb

## 2022-06-30 DIAGNOSIS — J3489 Other specified disorders of nose and nasal sinuses: Secondary | ICD-10-CM | POA: Diagnosis not present

## 2022-06-30 DIAGNOSIS — E782 Mixed hyperlipidemia: Secondary | ICD-10-CM | POA: Diagnosis not present

## 2022-06-30 DIAGNOSIS — M1711 Unilateral primary osteoarthritis, right knee: Secondary | ICD-10-CM | POA: Diagnosis not present

## 2022-06-30 DIAGNOSIS — I10 Essential (primary) hypertension: Secondary | ICD-10-CM

## 2022-06-30 DIAGNOSIS — L989 Disorder of the skin and subcutaneous tissue, unspecified: Secondary | ICD-10-CM | POA: Insufficient documentation

## 2022-06-30 DIAGNOSIS — M159 Polyosteoarthritis, unspecified: Secondary | ICD-10-CM | POA: Diagnosis not present

## 2022-06-30 DIAGNOSIS — G5603 Carpal tunnel syndrome, bilateral upper limbs: Secondary | ICD-10-CM | POA: Diagnosis not present

## 2022-06-30 MED ORDER — HYDROCHLOROTHIAZIDE 25 MG PO TABS: 25.0000 mg | ORAL_TABLET | Freq: Every day | ORAL | 0 refills | Status: DC

## 2022-06-30 MED ORDER — MUPIROCIN 2 % EX OINT: 1.0000 | TOPICAL_OINTMENT | Freq: Two times a day (BID) | CUTANEOUS | 0 refills | Status: DC

## 2022-06-30 MED ORDER — VALSARTAN 40 MG PO TABS: 40.0000 mg | ORAL_TABLET | Freq: Every day | ORAL | 0 refills | Status: DC

## 2022-06-30 NOTE — Patient Instructions (Signed)
VISIT SUMMARY:  During your visit, we discussed several health concerns including recurring nasal dryness, numbness and tingling in your hands, varicose veins, a precancerous skin condition, and arthritis in your left knee. We also reviewed your hypertension and hyperlipidemia management.  YOUR PLAN:  -NASAL DRYNESS: This is a condition where the inside of your nose becomes dry and crusty. We will treat this with Bactroban ointment and Aquaphor for ongoing moisturization.  -ALLERGIC RHINITIS: This is a type of inflammation in the nose which occurs when the immune system overreacts to allergens in the air. Continue using Flonase as needed, but stop temporarily until your nasal dryness resolves.  -HYPERLIPIDEMIA: This is a condition where there are high levels of fats (lipids) in the blood. We will discontinue Crestor and Zetia and reassess your cholesterol levels in 3 months.  -VARICOSE VEINS you can have the spider veins treated however this would be considered a cosmetic procedure that your dermatologist could likely help you with.   -PRE-CANCEROUS SKIN LESIONS: These are abnormal skin growths that have the potential to become cancer. We support your dermatologist's plan for topical chemotherapy.  -KNEE OSTEOARTHRITIS: This is a type of arthritis that occurs when flexible tissue at the ends of bones wears down. We will plan for pre-operative medical clearance as your surgery date approaches.  -CARPAL TUNNEL SYNDROME: This is a condition that causes numbness, tingling, or weakness in your hand. We support your referral to a specialist for further evaluation.  -HYDRONEPHROSIS: This is a condition that typically occurs when a kidney swells due to the failure of normal drainage of urine from the kidney to the bladder. We will monitor your kidney function with lab work and advise you to report any low back pain.  INSTRUCTIONS:  We will check your kidney function and electrolytes today. Please  follow up in 3 months for a cholesterol check and update [place] on the timing of your planned knee surgery.

## 2022-06-30 NOTE — Assessment & Plan Note (Signed)
Bp stable, continue current medications

## 2022-06-30 NOTE — Assessment & Plan Note (Signed)
  Knee Osteoarthritis: Bone-on-bone osteoarthritis in the left knee. Patient is considering knee replacement surgery in August. - Plan for pre-operative medical clearance including chest x-ray, EKG, and labs as the surgery date approaches.

## 2022-06-30 NOTE — Assessment & Plan Note (Signed)
Patient wishes to manage cholesterol levels through diet and exercise rather than medication. Previous intolerance to zetia (diarrhea).  She discontinued crestor and zetia.   - Plan to reassess cholesterol levels in 3 months.

## 2022-06-30 NOTE — Assessment & Plan Note (Signed)
Has had good relief in the past with bactroban ointment. Rx sent.

## 2022-06-30 NOTE — Assessment & Plan Note (Signed)
Dermatologist has identified pre-cancerous lesions on the face and recommended topical chemotherapy. - Support dermatologist's plan for topical chemotherapy.

## 2022-06-30 NOTE — Progress Notes (Signed)
Subjective:     Patient ID: Megan Crane, female    DOB: [birth year], 72 y.o.   MRN: [id number]  Chief Complaint  Patient presents with   Nose Problem    Complains of dryness inside both nostrils    Varicose Veins    Will like to get veins evaluated   Hand Pain    Complains of bilateral hand pain, from elbow down     Hand Pain     Discussed the use of AI scribe software for clinical note transcription with the patient, who gave verbal consent to proceed.  History of Present Illness   The patient, with a history of hypertension managed with valsartan 40mg  and hydrochlorothiazide 25mg , presents with multiple concerns. The primary concern is a recurring nasal issue described as dryness and scabbing, which was previously treated with Bactroban ointment. The patient also reports numbness and tingling in the hands, which is suspected to be carpal tunnel syndrome. She recently saw her orthopedist and they are referring her to a carpal tunnel specialist. The patient has noticed an increase in varicose veins, particularly spider veins, and is considering cosmetic treatment. The patient also mentions a history of a precancerous skin condition managed by a dermatologist with a topical chemotherapy agent. The patient is considering a knee replacement surgery due to bone-on-bone arthritis in the left knee.       Health Maintenance Due  Topic Date Due   Hepatitis C Screening  Never done   Zoster Vaccines- Shingrix (1 of 2) Never done   DEXA SCAN  10/14/2017   COVID-19 Vaccine (4 - 2023-24 season) 09/09/2021    Past Medical History:  Diagnosis Date   Anxiety disorder    hx vasovagal responses   Headache(784.0)    History of kidney stones    Hydronephrosis, right    Hyperlipidemia    Hypertension    Nephrolithiasis    Positive nasal culture for methicillin resistant Staphylococcus aureus    Right ureteral stone    Thrombophlebitis of leg, left, superficial 11/17/2020   Urinary  incontinence    sees Dr. Perley Jain     Past Surgical History:  Procedure Laterality Date   COLONOSCOPY  12/28/2015   per Dr. Adela Lank, serrated polyps and diverticula, repeat in 3 yrs    CYSTOSCOPY  06/09/2011   Procedure: CYSTOSCOPY;  Surgeon: Martina Sinner, MD;  Location: [facility];  Service: Urology;  Laterality: N/A;   CYSTOSCOPY W/ URETERAL STENT PLACEMENT  02/03/2014   Procedure: CYSTOSCOPY WITH  RIGHT RETROGRADE PYELOGRAM/ RIGHT URETERAL STENT PLACEMENT;  Surgeon: Anner Crete, MD;  Location: [facility];  Service: Urology;;   CYSTOSCOPY WITH BIOPSY  02/03/2014   Procedure: CYSTOSCOPY WITH BIOPSY;  Surgeon: Anner Crete, MD;  Location: [facility];  Service: Urology;;   PILONIDAL CYST EXCISION  1974   RECTOCELE REPAIR  06/09/2011   Procedure: POSTERIOR REPAIR (RECTOCELE);  Surgeon: Martina Sinner, MD;  Location: [facility];  Service: Urology;  Laterality: N/A;  Xenform graft 6x10   RIGHT URETEROSCOPIC STONE EXTRACTION / STENT PLACEMENT  03-13-2000   URETEROSCOPY Right 02/03/2014   Procedure: URETEROSCOPY WITH MANAGEMENT OF URETERAL STRICTURE;  Surgeon: Anner Crete, MD;  Location[facility];  Service: Urology;  Laterality: Right;   VAGINAL HYSTERECTOMY  06/09/2011   Procedure: HYSTERECTOMY VAGINAL;  Surgeon: Mickel Baas, MD;  Location: [facility];  Service: Gynecology;  Laterality: N/A;   VAGINAL PROLAPSE REPAIR  06/09/2011  Procedure: VAGINAL VAULT SUSPENSION;  Surgeon: Martina Sinner, MD;  Location: [facility];  Service: Urology;  Laterality: N/A;   VEIN LIGATION AND STRIPPING     LEFT LEG    Family History  Problem Relation Age of Onset   Colon cancer Mother 31   Colon polyps Father    Cancer Father        kidney, bladder    Colon polyps Brother    Coronary artery disease Other    Diabetes Other    Hyperlipidemia Other    Kidney disease Other    Cancer Other        lung   Esophageal cancer Neg Hx    Stomach cancer Neg  Hx    Rectal cancer Neg Hx     Social History   Socioeconomic History   Marital status: Married    Spouse name: Not on file   Number of children: Not on file   Years of education: Not on file   Highest education level: Not on file  Occupational History   Not on file  Tobacco Use   Smoking status: Former    Types: Cigarettes   Smokeless tobacco: Never  Vaping Use   Vaping Use: Never used  Substance and Sexual Activity   Alcohol use: Yes    Comment: once a month   Drug use: No   Sexual activity: Not Currently    Partners: Male  Other Topics Concern   Not on file  Social History Narrative   Married to a Truck driver   2 grown daughters   One daughter in [place]- no children, divorced   One daughter in [place], 3 children   Went to [employer] in [place], [place]   Looking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   Enjoys quilting/cross stitch, motor home, travelling   2 dogs    Best friend is Therapist, nutritional Gum   Social Determinants of Health   Financial Resource Strain: Low Risk  (10/28/2021)   Overall Financial Resource Strain (CARDIA)    Difficulty of Paying Living Expenses: Not hard at all  Food Insecurity: No Food Insecurity (10/28/2021)   Hunger Vital Sign    Worried About Running Out of Food in the Last Year: Never true    Ran Out of Food in the Last Year: Never true  Transportation Needs: No Transportation Needs (10/28/2021)   PRAPARE - Administrator, Civil Service (Medical): No    Lack of Transportation (Non-Medical): No  Physical Activity: Inactive (10/28/2021)   Exercise Vital Sign    Days of Exercise per Week: 0 days    Minutes of Exercise per Session: 0 min  Stress: No Stress Concern Present (10/28/2021)   [employer] of Occupational Health - Occupational Stress Questionnaire    Feeling of Stress : Not at all  Social Connections: Socially Integrated (10/28/2021)   Social Connection and Isolation Panel [NHANES]    Frequency of Communication with Friends  and Family: More than three times a week    Frequency of Social Gatherings with Friends and Family: More than three times a week    Attends Religious Services: More than 4 times per year    Active Member of [employer] or Organizations: Yes    Attends Banker Meetings: More than 4 times per year    Marital Status: Married  Catering manager Violence: Not At Risk (10/28/2021)   Humiliation, Afraid, Rape, and Kick questionnaire    Fear of Current or Ex-Partner: No  Emotionally Abused: No    Physically Abused: No    Sexually Abused: No    Outpatient Medications Prior to Visit  Medication Sig Dispense Refill   aspirin EC 81 MG tablet Take 81 mg by mouth daily. Swallow whole.     cyanocobalamin (VITAMIN B12) 1000 MCG tablet Take 1 tablet (1,000 mcg total) by mouth daily.     estradiol (ESTRACE VAGINAL) 0.1 MG/GM vaginal cream Apply up to the #1 on applicator PV twice weekly 42.5 g 0   fluticasone (FLONASE) 50 MCG/ACT nasal spray Place 2 sprays into both nostrils daily. 16 g 11   solifenacin (VESICARE) 5 MG tablet Take 5 mg by mouth daily.     vitamin C (ASCORBIC ACID) 500 MG tablet Take 500 mg by mouth once a week.     hydrochlorothiazide (HYDRODIURIL) 25 MG tablet Take 1 tablet (25 mg total) by mouth daily. 90 tablet 0   valsartan (DIOVAN) 40 MG tablet Take 1 tablet (40 mg total) by mouth daily. 90 tablet 0   ezetimibe (ZETIA) 10 MG tablet Take 1 tablet (10 mg total) by mouth daily. 90 tablet 3   LORazepam (ATIVAN) 0.5 MG tablet Take 1 tablet (0.5 mg total) by mouth every 8 (eight) hours as needed for anxiety. 30 tablet 0   rosuvastatin (CRESTOR) 10 MG tablet Take 1 tablet (10 mg total) by mouth daily. 90 tablet 2   No facility-administered medications prior to visit.    Allergies  Allergen Reactions   Lidocaine Anaphylaxis, Shortness Of Breath and Other (See Comments)    "Eyes crossed"   Escitalopram     Sleepy   Levofloxacin Itching and Other (See Comments)    disoriented    Oxycodone-Aspirin Other (See Comments)    "VERY DIZZY"    ROS    See HPI Objective:    Physical Exam Constitutional:      General: She is not in acute distress.    Appearance: Normal appearance. She is well-developed.  HENT:     Head: Normocephalic and atraumatic.     Right Ear: External ear normal.     Left Ear: External ear normal.     Nose:     Comments: Some dry skin noted inside both nares Eyes:     General: No scleral icterus. Neck:     Thyroid: No thyromegaly.  Cardiovascular:     Rate and Rhythm: Normal rate and regular rhythm.     Heart sounds: Normal heart sounds. No murmur heard. Pulmonary:     Effort: Pulmonary effort is normal. No respiratory distress.     Breath sounds: Normal breath sounds. No wheezing.  Musculoskeletal:     Cervical back: Neck supple.  Skin:    General: Skin is warm and dry.  Neurological:     Mental Status: She is alert and oriented to person, place, and time.  Psychiatric:        Mood and Affect: Mood normal.        Behavior: Behavior normal.        Thought Content: Thought content normal.        Judgment: Judgment normal.      BP 126/68 (BP Location: Right Arm, Patient Position: Sitting, Cuff Size: Large)   Pulse 88   Temp 98.3 F (36.8 C) (Oral)   Wt 214 lb (97.1 kg)   SpO2 95%   BMI 34.54 kg/m  Wt Readings from Last 3 Encounters:  06/30/22 214 lb (97.1 kg)  03/29/22 214 lb (97.1  kg)  03/28/22 212 lb (96.2 kg)       Assessment & Plan:   General Health Maintenance / Followup Plans - Check kidney function and electrolytes today. - Follow up in 3 months for cholesterol check. - Patient to update on timing of planned knee surgery. Problem List Items Addressed This Visit       Unprioritized   Primary osteoarthritis involving multiple joints     Knee Osteoarthritis: Bone-on-bone osteoarthritis in the left knee. Patient is considering knee replacement surgery in August. - Plan for pre-operative medical clearance  including chest x-ray, EKG, and labs as the surgery date approaches.      Primary hypertension - Primary   Relevant Medications   valsartan (DIOVAN) 40 MG tablet   hydrochlorothiazide (HYDRODIURIL) 25 MG tablet   Other Relevant Orders   Basic Metabolic Panel (BMET)   Precancerous skin lesion    Dermatologist has identified pre-cancerous lesions on the face and recommended topical chemotherapy. - Support dermatologist's plan for topical chemotherapy.      Nasal dryness    Has had good relief in the past with bactroban ointment. Rx sent.       Hyperlipidemia     Patient wishes to manage cholesterol levels through diet and exercise rather than medication. Previous intolerance to zetia (diarrhea).  She discontinued crestor and zetia.   - Plan to reassess cholesterol levels in 3 months.      Relevant Medications   valsartan (DIOVAN) 40 MG tablet   hydrochlorothiazide (HYDRODIURIL) 25 MG tablet   Bilateral carpal tunnel syndrome    Ortho has referred her to a carpal tunnel specialist.        I have discontinued Arelly P. Skalicky's LORazepam, rosuvastatin, and ezetimibe. I am also having her start on mupirocin ointment. Additionally, I am having her maintain her ascorbic acid, aspirin EC, solifenacin, estradiol, fluticasone, cyanocobalamin, valsartan, and hydrochlorothiazide.  Meds ordered this encounter  Medications   mupirocin ointment (BACTROBAN) 2 %    Sig: Apply 1 Application topically 2 (two) times daily.    Dispense:  22 g    Refill:  0    Order Specific Question:   Supervising Provider    Answer:   Danise Edge A [[id number]]   valsartan (DIOVAN) 40 MG tablet    Sig: Take 1 tablet (40 mg total) by mouth daily.    Dispense:  90 tablet    Refill:  0    Order Specific Question:   Supervising Provider    Answer:   Danise Edge A [[id number]]   hydrochlorothiazide (HYDRODIURIL) 25 MG tablet    Sig: Take 1 tablet (25 mg total) by mouth daily.    Dispense:  90 tablet    Refill:  0     Order Specific Question:   Supervising Provider    Answer:   Danise Edge A [[id number]]

## 2022-06-30 NOTE — Assessment & Plan Note (Signed)
Ortho has referred her to a carpal tunnel specialist.

## 2022-07-01 ENCOUNTER — Encounter: Payer: Self-pay | Admitting: Family

## 2022-07-01 LAB — BASIC METABOLIC PANEL
BUN: 22 mg/dL (ref 7–25)
CO2: 26 mmol/L (ref 20–32)
Calcium: 10.2 mg/dL (ref 8.6–10.4)
Chloride: 105 mmol/L (ref 98–110)
Creat: 0.81 mg/dL (ref 0.50–1.05)
Glucose, Bld: 81 mg/dL (ref 65–99)
Potassium: 3.9 mmol/L (ref 3.5–5.3)
Sodium: 139 mmol/L (ref 135–146)

## 2022-07-03 NOTE — Progress Notes (Signed)
Letter mailed out. Done 

## 2022-07-05 DIAGNOSIS — H47323 Drusen of optic disc, bilateral: Secondary | ICD-10-CM | POA: Diagnosis not present

## 2022-07-24 DIAGNOSIS — R200 Anesthesia of skin: Secondary | ICD-10-CM | POA: Diagnosis not present

## 2022-07-25 ENCOUNTER — Telehealth: Payer: Self-pay | Admitting: *Deleted

## 2022-07-25 ENCOUNTER — Telehealth: Payer: Self-pay | Admitting: Family

## 2022-07-25 DIAGNOSIS — M1711 Unilateral primary osteoarthritis, right knee: Secondary | ICD-10-CM | POA: Diagnosis not present

## 2022-07-25 NOTE — Telephone Encounter (Signed)
Patient  called and was wanting lab results, please call patient before 4:30pm

## 2022-07-25 NOTE — Telephone Encounter (Signed)
   Name: Megan Crane  DOB: 1950/02/02  MRN: [id number]  Primary Cardiologist: None   Preoperative team, please contact this patient and set up a phone call appointment for further preoperative risk assessment. Please obtain consent and complete medication review. Thank you for your help.  I confirm that guidance regarding antiplatelet and oral anticoagulation therapy has been completed and, if necessary, noted below.  Cardiologist does not prescribe aspirin.  Recommendations for holding aspirin will need to come from prescribing provider.   Ronney Asters, NP 07/25/2022, 4:03 PM [facility]

## 2022-07-25 NOTE — Telephone Encounter (Signed)
Patient notified labs from 6/2 normal

## 2022-07-25 NOTE — Telephone Encounter (Signed)
   Pre-operative Risk Assessment    Patient Name: Megan Crane  DOB: 05/22/1950 MRN: [id number]      Request for Surgical Clearance    Procedure:   RT PARTIAL KNEE REPLACEMENT  Date of Surgery:  Clearance TBD                                 Surgeon:  Weber Cooks, MD Surgeon's Group or Practice Name:  [facility] Phone number:  [phone] Fax number:  [phone]   Type of Clearance Requested:   - Medical  - Pharmacy:  Hold Aspirin NOT INDICATED   Type of Anesthesia:  Spinal   Additional requests/questions:    [name]   07/25/2022, 12:13 PM

## 2022-07-26 ENCOUNTER — Telehealth: Payer: Self-pay

## 2022-07-26 NOTE — Telephone Encounter (Signed)
  Patient Consent for Virtual Visit        Megan Crane has provided verbal consent on 07/26/2022 for a virtual visit (video or telephone).   CONSENT FOR VIRTUAL VISIT FOR:  Megan Crane  By participating in this virtual visit I agree to the following:  I hereby voluntarily request, consent and authorize [facility] and its employed or contracted physicians, physician assistants, nurse practitioners or other licensed health care professionals (the Practitioner), to provide me with telemedicine health care services (the "Services") as deemed necessary by the treating Practitioner. I acknowledge and consent to receive the Services by the Practitioner via telemedicine. I understand that the telemedicine visit will involve communicating with the Practitioner through live audiovisual communication technology and the disclosure of certain medical information by electronic transmission. I acknowledge that I have been given the opportunity to request an in-person assessment or other available alternative prior to the telemedicine visit and am voluntarily participating in the telemedicine visit.  I understand that I have the right to withhold or withdraw my consent to the use of telemedicine in the course of my care at any time, without affecting my right to future care or treatment, and that the Practitioner or I may terminate the telemedicine visit at any time. I understand that I have the right to inspect all information obtained and/or recorded in the course of the telemedicine visit and may receive copies of available information for a reasonable fee.  I understand that some of the potential risks of receiving the Services via telemedicine include:  Delay or interruption in medical evaluation due to technological equipment failure or disruption; Information transmitted may not be sufficient (e.g. poor resolution of images) to allow for appropriate medical decision making by the  Practitioner; and/or  In rare instances, security protocols could fail, causing a breach of personal health information.  Furthermore, I acknowledge that it is my responsibility to provide information about my medical history, conditions and care that is complete and accurate to the best of my ability. I acknowledge that Practitioner's advice, recommendations, and/or decision may be based on factors not within their control, such as incomplete or inaccurate data provided by me or distortions of diagnostic images or specimens that may result from electronic transmissions. I understand that the practice of medicine is not an exact science and that Practitioner makes no warranties or guarantees regarding treatment outcomes. I acknowledge that a copy of this consent can be made available to me via my patient portal [facility] MyChart), or I can request a printed copy by calling the office of [facility].    I understand that my insurance will be billed for this visit.   I have read or had this consent read to me. I understand the contents of this consent, which adequately explains the benefits and risks of the Services being provided via telemedicine.  I have been provided ample opportunity to ask questions regarding this consent and the Services and have had my questions answered to my satisfaction. I give my informed consent for the services to be provided through the use of telemedicine in my medical care

## 2022-07-26 NOTE — Telephone Encounter (Signed)
Pt is scheduled for tele on 07/24 at 9am. Med rec and consent done

## 2022-07-31 ENCOUNTER — Telehealth: Payer: Self-pay | Admitting: Family

## 2022-07-31 NOTE — Telephone Encounter (Signed)
I received medical clearance form from her orthopedic doctor. Please contact pt to schedule a pre-op visit with me so so I can work on her clearance.

## 2022-08-01 ENCOUNTER — Telehealth: Payer: Self-pay | Admitting: Family

## 2022-08-01 NOTE — Telephone Encounter (Signed)
Patient called [place] back and all questions were answered. She has appointment for preoperative visit on Monday 7/29

## 2022-08-01 NOTE — Telephone Encounter (Signed)
Called but no answer, lvm for patient to call back 

## 2022-08-01 NOTE — Telephone Encounter (Signed)
Pt called requesting a call from Windell Moulding when possible to go over a few points of her Pre-Op appointment.

## 2022-08-01 NOTE — Telephone Encounter (Signed)
Pt called in and was scheduled

## 2022-08-02 ENCOUNTER — Telehealth: Payer: Self-pay | Admitting: *Deleted

## 2022-08-02 ENCOUNTER — Encounter: Payer: Self-pay | Admitting: Nurse Practitioner

## 2022-08-02 ENCOUNTER — Ambulatory Visit: Payer: Medicare Other | Attending: Internal Medicine | Admitting: Nurse Practitioner

## 2022-08-02 DIAGNOSIS — Z01810 Encounter for preprocedural cardiovascular examination: Secondary | ICD-10-CM

## 2022-08-02 DIAGNOSIS — R55 Syncope and collapse: Secondary | ICD-10-CM

## 2022-08-02 HISTORY — DX: Syncope and collapse: R55

## 2022-08-02 NOTE — Telephone Encounter (Signed)
Called the pt back.  Provided all 3 Cardiologist Dr. Shari Prows suggest she establish care with.  Pt states she would like to establish care with a female Cardiologist, Dr. Tenny Craw.   Pt will be due for her next visit with our office in March 2025.  Will update this in her appt notes and recall for her to see Dr. Tenny Craw on next March 2025.  She is aware when that schedule comes open, she will get a call back from her scheduler to arrange that appt for that time with Dr. Tenny Craw.   Pt verbalized understanding and agrees with this plan.

## 2022-08-02 NOTE — Telephone Encounter (Signed)
-----   Message from Meriam Sprague sent at 08/02/2022  9:33 AM EDT ----- Burley Saver,   I completely understand and this has been happening a lot so I am sorry you got stuck on the phone. She is more of a prevention lady so I would recommend either [facility]rasekhar or Skains. If she wants a female, then Dr. Tenny Craw would be good! I can send a myChart message to help save people phone calls!  Hope you have a good day!  -Research scientist (physical sciences) ----- Message ----- From: Levi Aland, NP Sent: 08/02/2022   9:20 AM EDT To: Meriam Sprague, MD  Nehemiah Massed,  I spoke to this lady today for preop clearance and she kept me on the phone for 20 minutes ;) She specifically would like to know who you would refer her to at the [address] office. I tried to advise but she would like to hear from you :)  Thanks! Marcelino Duster

## 2022-08-02 NOTE — Progress Notes (Signed)
Virtual Visit via Telephone Note   Because of Megan Crane's co-morbid illnesses, she is at least at moderate risk for complications without adequate follow up.  This format is felt to be most appropriate for this patient at this time.  The patient did not have access to video technology/had technical difficulties with video requiring transitioning to audio format only (telephone).  All issues noted in this document were discussed and addressed.  No physical exam could be performed with this format.  Please refer to the patient's chart for her consent to telehealth for [facility].  Evaluation Performed:  Preoperative cardiovascular risk assessment _____________   Date:  08/02/2022   Patient ID:  Megan, Crane 1950-03-18, MRN [id number] Patient Location:  Home Provider location:   Office  Primary Care Provider:  Sandford Craze, NP Primary Cardiologist:  Meriam Sprague, MD  Chief Complaint / Patient Profile   72 y.o. y/o female with a h/o hypertension, hyperlipidemia, anxiety, and chest discomfort who was referred following ER visit 01/2022 for chest discomfort radiating to her back with negative workup who is pending right partial knee replacement and presents today for telephonic preoperative cardiovascular risk assessment.  History of Present Illness    Megan Crane is a 72 y.o. female who presents via audio/video conferencing for a telehealth visit today.  Pt was last seen in cardiology clinic on 04/02/22 by Dr. Shari Prows.  At that time Megan Crane was having episodes of chest pain thought to be worsened by symptoms of anxiety. CT calcium score was obtained 04/03/22 which revealed CAC score of 0. The patient is now pending procedure as outlined above. Since her last visit, she denies chest pain, shortness of breath, lower extremity edema, fatigue, palpitations, melena, hematuria, hemoptysis, diaphoresis, weakness, presyncope, syncope, orthopnea, and PND.  She remains active around her home and in her yard and can participate in [employer] program and riding stationary bike at [employer].   Past Medical History    Past Medical History:  Diagnosis Date   Anxiety disorder    hx vasovagal responses   Headache(784.0)    History of kidney stones    Hydronephrosis, right    Hyperlipidemia    Hypertension    Nephrolithiasis    Positive nasal culture for methicillin resistant Staphylococcus aureus    Right ureteral stone    Thrombophlebitis of leg, left, superficial 11/17/2020   Urinary incontinence    sees Dr. Perley Jain    Past Surgical History:  Procedure Laterality Date   COLONOSCOPY  12/28/2015   per Dr. Adela Lank, serrated polyps and diverticula, repeat in 3 yrs    CYSTOSCOPY  06/09/2011   Procedure: CYSTOSCOPY;  Surgeon: Martina Sinner, MD;  Location: [facility];  Service: Urology;  Laterality: N/A;   CYSTOSCOPY W/ URETERAL STENT PLACEMENT  02/03/2014   Procedure: CYSTOSCOPY WITH  RIGHT RETROGRADE PYELOGRAM/ RIGHT URETERAL STENT PLACEMENT;  Surgeon: Anner Crete, MD;  Location[facility];  Service: Urology;;   CYSTOSCOPY WITH BIOPSY  02/03/2014   Procedure: CYSTOSCOPY WITH BIOPSY;  Surgeon: Anner Crete, MD;  Location: [facility];  Service: Urology;;   PILONIDAL CYST EXCISION  1974   RECTOCELE REPAIR  06/09/2011   Procedure: POSTERIOR REPAIR (RECTOCELE);  Surgeon: Martina Sinner, MD;  Location: [facility];  Service: Urology;  Laterality: N/A;  Xenform graft 6x10   RIGHT URETEROSCOPIC STONE EXTRACTION / STENT PLACEMENT  03-13-2000   URETEROSCOPY Right 02/03/2014   Procedure:  URETEROSCOPY WITH MANAGEMENT OF URETERAL STRICTURE;  Surgeon: Anner Crete, MD;  Location: [facility];  Service: Urology;  Laterality: Right;   VAGINAL HYSTERECTOMY  06/09/2011   Procedure: HYSTERECTOMY VAGINAL;  Surgeon: Mickel Baas, MD;  Location: [facility];  Service: Gynecology;  Laterality: N/A;   VAGINAL PROLAPSE  REPAIR  06/09/2011   Procedure: VAGINAL VAULT SUSPENSION;  Surgeon: Martina Sinner, MD;  Location: [facility];  Service: Urology;  Laterality: N/A;   VEIN LIGATION AND STRIPPING     LEFT LEG    Allergies  Allergies  Allergen Reactions   Lidocaine Anaphylaxis, Shortness Of Breath and Other (See Comments)    "Eyes crossed"   Escitalopram     Sleepy   Levofloxacin Itching and Other (See Comments)    disoriented   Oxycodone-Aspirin Other (See Comments)    "VERY DIZZY"    Home Medications    Prior to Admission medications   Medication Sig Start Date End Date Taking? Authorizing Provider  aspirin EC 81 MG tablet Take 81 mg by mouth daily. Swallow whole.    [provider]  cyanocobalamin (VITAMIN B12) 1000 MCG tablet Take 1 tablet (1,000 mcg total) by mouth daily. Patient not taking: Reported on 07/26/2022 03/02/22   Sandford Craze, NP  estradiol (ESTRACE VAGINAL) 0.1 MG/GM vaginal cream Apply up to the #1 on applicator PV twice weekly Patient not taking: Reported on 07/26/2022 11/23/21   Sandford Craze, NP  fluticasone (FLONASE) 50 MCG/ACT nasal spray Place 2 sprays into both nostrils daily. Patient not taking: Reported on 07/26/2022 01/17/22   Nelwyn Salisbury, MD  hydrochlorothiazide (HYDRODIURIL) 25 MG tablet Take 1 tablet (25 mg total) by mouth daily. 06/30/22   Sandford Craze, NP  mupirocin ointment (BACTROBAN) 2 % Apply 1 Application topically 2 (two) times daily. Patient not taking: Reported on 07/26/2022 06/30/22   Sandford Craze, NP  solifenacin (VESICARE) 5 MG tablet Take 5 mg by mouth daily. Patient not taking: Reported on 07/26/2022    [provider]  valsartan (DIOVAN) 40 MG tablet Take 1 tablet (40 mg total) by mouth daily. 06/30/22   Sandford Craze, NP  vitamin C (ASCORBIC ACID) 500 MG tablet Take 500 mg by mouth once a week. Patient not taking: Reported on 07/26/2022    [provider]    Physical Exam    Vital Signs:   Megan Crane does not have vital signs available for review today.  Given telephonic nature of communication, physical exam is limited. AAOx3. NAD. Normal affect.  Speech and respirations are unlabored.  Accessory Clinical Findings    None  Assessment & Plan    1.  Preoperative Cardiovascular Risk Assessment: According to the Revised Cardiac Risk Index (RCRI), her Perioperative Risk of Major Cardiac Event is (%): 0.4. Her Functional Capacity in METs is: 7.59 according to the Duke Activity Status Index (DASI). The patient is doing well from a cardiac perspective. Therefore, based on ACC/AHA guidelines, the patient would be at acceptable risk for the planned procedure without further cardiovascular testing.   The patient was advised that if she develops new symptoms prior to surgery to contact our office to arrange for a follow-up visit, and she verbalized understanding.  Recommendations for holding aspirin will need to come from prescribing provider. She is currently holding aspirin for 7 days as advised by surgeon.    A copy of this note will be routed to requesting surgeon.  Time:   Today, I have spent 10  minutes with the patient with telehealth technology discussing medical history, symptoms, and management plan.    Levi Aland, NP-C  08/02/2022, 8:59 AM [address] Office [phone] Fax [phone]   08/02/2022, 8:57 AM

## 2022-08-07 ENCOUNTER — Ambulatory Visit (HOSPITAL_BASED_OUTPATIENT_CLINIC_OR_DEPARTMENT_OTHER)
Admission: RE | Admit: 2022-08-07 | Discharge: 2022-08-07 | Disposition: A | Discharge status: Home / Self Care | Payer: Medicare Other | Source: Ambulatory Visit | Attending: Family | Admitting: Family

## 2022-08-07 ENCOUNTER — Ambulatory Visit (INDEPENDENT_AMBULATORY_CARE_PROVIDER_SITE_OTHER): Payer: Medicare Other | Admitting: Family

## 2022-08-07 VITALS — BP 132/76 | HR 85 | Temp 98.1°F | Resp 16 | Wt 214.0 lb

## 2022-08-07 DIAGNOSIS — I10 Essential (primary) hypertension: Secondary | ICD-10-CM | POA: Diagnosis not present

## 2022-08-07 DIAGNOSIS — L989 Disorder of the skin and subcutaneous tissue, unspecified: Secondary | ICD-10-CM

## 2022-08-07 DIAGNOSIS — R739 Hyperglycemia, unspecified: Secondary | ICD-10-CM

## 2022-08-07 DIAGNOSIS — R32 Unspecified urinary incontinence: Secondary | ICD-10-CM | POA: Diagnosis not present

## 2022-08-07 DIAGNOSIS — Z01818 Encounter for other preprocedural examination: Secondary | ICD-10-CM | POA: Insufficient documentation

## 2022-08-07 DIAGNOSIS — J984 Other disorders of lung: Secondary | ICD-10-CM | POA: Diagnosis not present

## 2022-08-07 LAB — PROTIME-INR
INR: 1 ratio (ref 0.8–1.0)
Prothrombin Time: 11 s (ref 9.6–13.1)

## 2022-08-07 LAB — CBC WITH DIFFERENTIAL/PLATELET
Basophils Absolute: 0 10*3/uL (ref 0.0–0.1)
Basophils Relative: 0.8 % (ref 0.0–3.0)
Eosinophils Absolute: 0.2 10*3/uL (ref 0.0–0.7)
Eosinophils Relative: 2.7 % (ref 0.0–5.0)
HCT: 42.9 % (ref 36.0–46.0)
Hemoglobin: 13.8 g/dL (ref 12.0–15.0)
Lymphocytes Relative: 26.6 % (ref 12.0–46.0)
Lymphs Abs: 1.5 10*3/uL (ref 0.7–4.0)
MCHC: 32.1 g/dL (ref 30.0–36.0)
MCV: 92 fl (ref 78.0–100.0)
Monocytes Absolute: 0.5 10*3/uL (ref 0.1–1.0)
Monocytes Relative: 8.6 % (ref 3.0–12.0)
Neutro Abs: 3.4 10*3/uL (ref 1.4–7.7)
Neutrophils Relative %: 61.3 % (ref 43.0–77.0)
Platelets: 201 10*3/uL (ref 150.0–400.0)
RBC: 4.66 Mil/uL (ref 3.87–5.11)
RDW: 14 % (ref 11.5–15.5)
WBC: 5.5 10*3/uL (ref 4.0–10.5)

## 2022-08-07 LAB — COMPREHENSIVE METABOLIC PANEL
ALT: 23 U/L (ref 0–35)
AST: 23 U/L (ref 0–37)
Albumin: 4.3 g/dL (ref 3.5–5.2)
Alkaline Phosphatase: 68 U/L (ref 39–117)
BUN: 21 mg/dL (ref 6–23)
CO2: 28 mEq/L (ref 19–32)
Calcium: 10.1 mg/dL (ref 8.4–10.5)
Chloride: 100 mEq/L (ref 96–112)
Creatinine, Ser: 0.83 mg/dL (ref 0.40–1.20)
GFR: 71.73 mL/min (ref >60.00)
Glucose, Bld: 89 mg/dL (ref 70–99)
Potassium: 3.8 mEq/L (ref 3.5–5.1)
Sodium: 136 mEq/L (ref 135–145)
Total Bilirubin: 0.5 mg/dL (ref 0.2–1.2)
Total Protein: 7.2 g/dL (ref 6.0–8.3)

## 2022-08-07 LAB — APTT: aPTT: 30.1 s (ref 25.4–36.8)

## 2022-08-07 LAB — HEMOGLOBIN A1C: Hgb A1c MFr Bld: 5.8 % (ref 4.6–6.5)

## 2022-08-07 NOTE — Progress Notes (Signed)
Subjective:     Patient ID: Megan Crane, female    DOB: 02-May-1950, 72 y.o.   MRN: [id number]  Chief Complaint  Patient presents with   Pre-op Exam    Here for pre operative visit    HPI  Discussed the use of AI scribe software for clinical note transcription with the patient, who gave verbal consent to proceed.  Pt is planning for a right partial knee replacement at [facility] in August. History of Present Illness   The patient, with a history of hypertension managed with hydrochlorothiazide and valsartan, presents for preoperative clearance. She reports no cough, cold symptoms, or hearing or vision concerns. She has been evaluated by dermatology for precancerous spots on her face and has been applying a topical treatment. She reports feeling 'terrible' after application, likening it to chemotherapy, but has continued to apply it as directed. She denies chest pain, shortness of breath, and leg swelling. She reports occasional indigestion, attributing it to dietary choices, and denies urinary symptoms. She has a history of urinary incontinence and has been prescribed a vaginal estrogen cream, which she has not been using consistently. She also reports occasional joint pain in her knee, which worsens with walking.         Health Maintenance Due  Topic Date Due   Hepatitis C Screening  Never done   Zoster Vaccines- Shingrix (1 of 2) Never done   DEXA SCAN  10/14/2017   COVID-19 Vaccine (4 - 2023-24 season) 09/09/2021   DTaP/Tdap/Td (2 - Td or Tdap) 07/23/2022    Past Medical History:  Diagnosis Date   Anxiety disorder    hx vasovagal responses   Headache(784.0)    History of kidney stones    Hydronephrosis, right    Hyperlipidemia    Hypertension    Nephrolithiasis    Positive nasal culture for methicillin resistant Staphylococcus aureus    Right ureteral stone    Thrombophlebitis of leg, left, superficial 11/17/2020   Urinary incontinence    sees Dr. McDiarmid     Vasovagal syncope 08/02/2022    Past Surgical History:  Procedure Laterality Date   COLONOSCOPY  12/28/2015   per Dr. Adela Lank, serrated polyps and diverticula, repeat in 3 yrs    CYSTOSCOPY  06/09/2011   Procedure: CYSTOSCOPY;  Surgeon: Martina Sinner, MD;  Location: [facility];  Service: Urology;  Laterality: N/A;   CYSTOSCOPY W/ URETERAL STENT PLACEMENT  02/03/2014   Procedure: CYSTOSCOPY WITH  RIGHT RETROGRADE PYELOGRAM/ RIGHT URETERAL STENT PLACEMENT;  Surgeon: Anner Crete, MD;  Location: [facility];  Service: Urology;;   CYSTOSCOPY WITH BIOPSY  02/03/2014   Procedure: CYSTOSCOPY WITH BIOPSY;  Surgeon: Anner Crete, MD;  Location[facility];  Service: Urology;;   PILONIDAL CYST EXCISION  1974   RECTOCELE REPAIR  06/09/2011   Procedure: POSTERIOR REPAIR (RECTOCELE);  Surgeon: Martina Sinner, MD;  Location: [facility];  Service: Urology;  Laterality: N/A;  Xenform graft 6x10   RIGHT URETEROSCOPIC STONE EXTRACTION / STENT PLACEMENT  03-13-2000   URETEROSCOPY Right 02/03/2014   Procedure: URETEROSCOPY WITH MANAGEMENT OF URETERAL STRICTURE;  Surgeon: Anner Crete, MD;  Location[facility];  Service: Urology;  Laterality: Right;   VAGINAL HYSTERECTOMY  06/09/2011   Procedure: HYSTERECTOMY VAGINAL;  Surgeon: Mickel Baas, MD;  Location: [facility];  Service: Gynecology;  Laterality: N/A;   VAGINAL PROLAPSE REPAIR  06/09/2011   Procedure: VAGINAL VAULT SUSPENSION;  Surgeon: Lorin Picket  Lyn Henri, MD;  Location: [facility];  Service: Urology;  Laterality: N/A;   VEIN LIGATION AND STRIPPING     LEFT LEG    Family History  Problem Relation Age of Onset   Colon cancer Mother 25   Colon polyps Father    Cancer Father        kidney, bladder    Colon polyps Brother    Coronary artery disease Other    Diabetes Other    Hyperlipidemia Other    Kidney disease Other    Cancer Other        lung   Esophageal cancer Neg Hx    Stomach cancer Neg Hx     Rectal cancer Neg Hx     Social History   Socioeconomic History   Marital status: Married    Spouse name: Not on file   Number of children: Not on file   Years of education: Not on file   Highest education level: Not on file  Occupational History   Not on file  Tobacco Use   Smoking status: Former    Types: Cigarettes   Smokeless tobacco: Never  Vaping Use   Vaping status: Never Used  Substance and Sexual Activity   Alcohol use: Yes    Comment: once a month   Drug use: No   Sexual activity: Not Currently    Partners: Male  Other Topics Concern   Not on file  Social History Narrative   Married to a Truck driver   2 grown daughters   One daughter in [place]- no children, divorced   One daughter in [place], 3 children   Went to [employer] in [place], [place]   Looking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   Enjoys quilting/cross stitch, motor home, travelling   2 dogs    Best friend is Therapist, nutritional Gum   Social Determinants of Health   Financial Resource Strain: Low Risk  (10/28/2021)   Overall Financial Resource Strain (CARDIA)    Difficulty of Paying Living Expenses: Not hard at all  Food Insecurity: No Food Insecurity (10/28/2021)   Hunger Vital Sign    Worried About Running Out of Food in the Last Year: Never true    Ran Out of Food in the Last Year: Never true  Transportation Needs: No Transportation Needs (10/28/2021)   PRAPARE - Administrator, Civil Service (Medical): No    Lack of Transportation (Non-Medical): No  Physical Activity: Inactive (10/28/2021)   Exercise Vital Sign    Days of Exercise per Week: 0 days    Minutes of Exercise per Session: 0 min  Stress: No Stress Concern Present (10/28/2021)   [employer] of Occupational Health - Occupational Stress Questionnaire    Feeling of Stress : Not at all  Social Connections: Socially Integrated (10/28/2021)   Social Connection and Isolation Panel [NHANES]    Frequency of Communication with Friends and  Family: More than three times a week    Frequency of Social Gatherings with Friends and Family: More than three times a week    Attends Religious Services: More than 4 times per year    Active Member of [employer] or Organizations: Yes    Attends Engineer, structural: More than 4 times per year    Marital Status: Married  Catering manager Violence: Not At Risk (10/28/2021)   Humiliation, Afraid, Rape, and Kick questionnaire    Fear of Current or Ex-Partner: No    Emotionally Abused: No  Physically Abused: No    Sexually Abused: No    Outpatient Medications Prior to Visit  Medication Sig Dispense Refill   aspirin EC 81 MG tablet Take 81 mg by mouth daily. Swallow whole.     hydrochlorothiazide (HYDRODIURIL) 25 MG tablet Take 1 tablet (25 mg total) by mouth daily. 90 tablet 0   valsartan (DIOVAN) 40 MG tablet Take 1 tablet (40 mg total) by mouth daily. 90 tablet 0   No facility-administered medications prior to visit.    Allergies  Allergen Reactions   Lidocaine Anaphylaxis, Shortness Of Breath and Other (See Comments)    "Eyes crossed"   Escitalopram     Sleepy   Levofloxacin Itching and Other (See Comments)    disoriented   Oxycodone-Aspirin Other (See Comments)    "VERY DIZZY"    Review of Systems  Constitutional:  Negative for weight loss.  HENT:  Negative for congestion and hearing loss.   Eyes:  Negative for blurred vision.  Respiratory:  Negative for cough and shortness of breath.   Cardiovascular:  Negative for chest pain and leg swelling.  Gastrointestinal:  Negative for constipation and diarrhea.  Genitourinary:  Negative for dysuria, frequency and urgency.  Musculoskeletal:  Positive for joint pain (some right knee pain).  Skin:  Negative for rash.  Neurological:  Negative for headaches.  Psychiatric/Behavioral:         Denies depression/anxiety   Lab Results  Component Value Date   HGBA1C 5.8 12/21/2017        Objective:    Physical  Exam Constitutional:      General: She is not in acute distress.    Appearance: Normal appearance. She is well-developed.  HENT:     Head: Normocephalic and atraumatic.     Right Ear: External ear normal.     Left Ear: External ear normal.  Eyes:     General: No scleral icterus. Neck:     Thyroid: No thyromegaly.  Cardiovascular:     Rate and Rhythm: Normal rate and regular rhythm.     Heart sounds: Normal heart sounds. No murmur heard. Pulmonary:     Effort: Pulmonary effort is normal. No respiratory distress.     Breath sounds: Normal breath sounds. No wheezing.  Musculoskeletal:     Cervical back: Neck supple.  Skin:    General: Skin is warm and dry.  Neurological:     Mental Status: She is alert and oriented to person, place, and time.  Psychiatric:        Mood and Affect: Mood normal.        Behavior: Behavior normal.        Thought Content: Thought content normal.        Judgment: Judgment normal.      BP 132/76 (BP Location: Right Arm, Patient Position: Sitting, Cuff Size: Large)   Pulse 85   Temp 98.1 F (36.7 C) (Oral)   Resp 16   Wt 214 lb (97.1 kg)   SpO2 100%   BMI 34.54 kg/m  Wt Readings from Last 3 Encounters:  08/07/22 214 lb (97.1 kg)  06/30/22 214 lb (97.1 kg)  03/29/22 214 lb (97.1 kg)       Assessment & Plan:   Problem List Items Addressed This Visit       Unprioritized   Visit for pre-operative examination - Primary    She has already been cleared by cardiology. She will need to hold aspirin for 1 week prior to surgery.  EKG tracing is personally reviewed.  EKG notes NSR.  No acute changes.  Labs and CXR as ordered as part of pre-op evaluation.       Relevant Orders   EKG 12-Lead (Completed)   Comp Met (CMET)   CBC with Differential/Platelet   Protime-INR   PTT   DG Chest 2 View   URINARY INCONTINENCE    Check urine culture pre-op.       Relevant Orders   Urine Culture   Primary hypertension    BP is at goal. Continue  valsartan and hydrochlorothiazide.        Precancerous skin lesion    Management per Dermatology.       Other Visit Diagnoses     Hyperglycemia       Relevant Orders   HgB A1c       I am having Gayatri P. Vanwey maintain her aspirin EC, valsartan, and hydrochlorothiazide.  No orders of the defined types were placed in this encounter.

## 2022-08-07 NOTE — Assessment & Plan Note (Signed)
Check urine culture pre-op.

## 2022-08-07 NOTE — Assessment & Plan Note (Signed)
Management per Dermatology.

## 2022-08-07 NOTE — Assessment & Plan Note (Addendum)
She has already been cleared by cardiology. She will need to hold aspirin for 1 week prior to surgery. EKG tracing is personally reviewed.  EKG notes NSR.  No acute changes.  Labs and CXR as ordered as part of pre-op evaluation.

## 2022-08-07 NOTE — Assessment & Plan Note (Signed)
BP is at goal. Continue valsartan and hydrochlorothiazide.

## 2022-08-07 NOTE — Patient Instructions (Signed)
VISIT SUMMARY:  During your visit, we discussed your high blood pressure, skin spots, urinary incontinence, upcoming knee surgery, vaginal dryness, and joint pain. Your blood pressure is well controlled, and your skin spots are healing well. You have no symptoms of a urinary infection, and your knee pain is being addressed with upcoming surgery. You were advised to use your prescribed cream for vaginal dryness more regularly.  YOUR PLAN:  -HIGH BLOOD PRESSURE: Your blood pressure is well controlled with your current medications. High blood pressure means the force of the blood against your artery walls is too high. Continue taking your medications as prescribed.  -SKIN SPOTS: Your skin spots are healing well with the topical treatment. These spots are precancerous areas caused by sun damage. Continue using the treatment as directed.  -URINARY INCONTINENCE: You have no symptoms of a urinary infection. Urinary incontinence is when you lose control of your bladder. We collected a urine sample for routine preoperative screening.  -PREOPERATIVE CLEARANCE: You are scheduled for knee replacement surgery. We have ordered several tests to ensure you are healthy for the procedure. These results will be sent to your surgeon.  -VAGINAL ATROPHY: You have been prescribed a cream for vaginal dryness but have not been using it regularly. Vaginal atrophy is thinning, drying and inflammation of the vaginal walls. Please use the cream as prescribed, but stop one week before surgery.  -JOINT PAIN: You have occasional pain in your knee, which will be addressed with your upcoming knee replacement surgery. Continue with the planned surgery.  INSTRUCTIONS:  We will communicate your lab results in 2-3 days. Please continue to take your medications as prescribed. Remember to stop taking aspirin 7 days before your surgery due to increased risk of bleeding. Also, stop using the estrogen cream one week before surgery.

## 2022-08-14 ENCOUNTER — Telehealth: Payer: Self-pay | Admitting: Family

## 2022-08-14 NOTE — Telephone Encounter (Signed)
Do you remember if we sent her surgical clearance on Friday? I think we did.

## 2022-08-17 DIAGNOSIS — R140 Abdominal distension (gaseous): Secondary | ICD-10-CM | POA: Diagnosis not present

## 2022-08-17 DIAGNOSIS — R350 Frequency of micturition: Secondary | ICD-10-CM | POA: Diagnosis not present

## 2022-08-21 ENCOUNTER — Ambulatory Visit: Payer: Medicare Other | Admitting: Family

## 2022-08-21 ENCOUNTER — Telehealth: Payer: Self-pay | Admitting: Family

## 2022-08-21 NOTE — Telephone Encounter (Signed)
Patient scheduled for tomorrow pm

## 2022-08-21 NOTE — Telephone Encounter (Signed)
The patient called requesting a refill for a medication to help with her nervous bladder. She wasn't sure of the specific medication but mentioned that Melissa would know which one she needs. The patient uses [facility] at [address] [place], [place], [place] [address] (Pharmacy [phone]).  Additionally, she asked to speak with Windell Moulding regarding her upcoming knee replacement surgery on the 9th. She would like Ruth's assistance in finalizing her surgery pack and requested that Windell Moulding call her to discuss the details of what she needs help with. Please call to follow up with pt.

## 2022-08-21 NOTE — Telephone Encounter (Signed)
Patient called back to add that she just finished up antibiotic for yeast infection and she said that she believes she may have a flare up of Interstitial cystitis and she was prescribed Macrobid 100 mg (generic) in the past.  Pt said it's lower bowel/intestine area and it kind of aches at her upper buttocks and sometimes in rectum area. Pt said she is not having any burning sensations or anything. Her urine test did not show a UTI and she has already been treated for a mild yeast infection. Patient wants to try to get everything situated before her surgery. Please also see previous note because patient also wants something to help relax her bladder Please call patient to discuss

## 2022-08-22 ENCOUNTER — Ambulatory Visit (INDEPENDENT_AMBULATORY_CARE_PROVIDER_SITE_OTHER): Payer: Medicare Other | Admitting: Family

## 2022-08-22 ENCOUNTER — Other Ambulatory Visit: Payer: Medicare Other

## 2022-08-22 VITALS — BP 116/71 | HR 94 | Temp 98.2°F | Resp 16 | Wt 212.3 lb

## 2022-08-22 DIAGNOSIS — R102 Pelvic and perineal pain: Secondary | ICD-10-CM | POA: Diagnosis not present

## 2022-08-22 LAB — POC URINALSYSI DIPSTICK (AUTOMATED)
Bilirubin, UA: NEGATIVE
Blood, UA: NEGATIVE
Glucose, UA: NEGATIVE
Ketones, UA: NEGATIVE
Nitrite, UA: NEGATIVE
Protein, UA: NEGATIVE
Spec Grav, UA: 1.01 (ref 1.010–1.025)
Urobilinogen, UA: 0.2 E.U./dL
pH, UA: 6 (ref 5.0–8.0)

## 2022-08-22 MED ORDER — CEPHALEXIN 500 MG PO CAPS: 500.0000 mg | ORAL_CAPSULE | Freq: Two times a day (BID) | ORAL | 0 refills | Status: DC

## 2022-08-22 NOTE — Patient Instructions (Signed)
VISIT SUMMARY:  During your visit, we discussed your recent experience of pelvic pressure, which you described as similar to the feeling right before childbirth. You also mentioned feeling generally unwell, drained, and lacking energy. We noted that you have a medium cystocele, which is a condition where the bladder drops down into the vagina due to weakened pelvic muscles. We also discussed your upcoming surgery.  YOUR PLAN:  -PELVIC PRESSURE: We suspect that your pelvic pressure might be due to a urinary tract infection (UTI), which is an infection in any part of your urinary system. We have started you on an antibiotic to treat this. If your urine culture comes back negative, we will consider interstitial cystitis, which is a chronic condition causing bladder pressure and pain, as a possible cause of your symptoms.  -GENERAL HEALTH MAINTENANCE: We want to ensure that your urinary symptoms are resolved before your upcoming surgery. This is to prevent any complications during or after the surgery.  INSTRUCTIONS:  Please start taking the prescribed antibiotic for the possible urinary tract infection. We will send your urine for culture to confirm the diagnosis. If the culture is negative, we will consider other possible causes for your symptoms. It's important to resolve these symptoms before your upcoming surgery. Please contact us if your symptoms persist or worsen.

## 2022-08-22 NOTE — Assessment & Plan Note (Signed)
New.  New onset pelvic pressure for 1.5 weeks. No urinary symptoms. Noted to have a grade 2 cystocele on exam. Urinalysis shows + leukocytes.  -Start empiric antibiotic for possible urinary tract infection. -Send urine for culture. -If culture negative, consider interstitial cystitis + cystocele as cause of symptoms.

## 2022-08-22 NOTE — Progress Notes (Signed)
Subjective:     Patient ID: Megan Crane, female    DOB: 05-11-1950, 72 y.o.   MRN: [id number]  Chief Complaint  Patient presents with   Pelvic pressure    Patient complains of having pelvic pressure with out out pain    HPI  Discussed the use of AI scribe software for clinical note transcription with the patient, who gave verbal consent to proceed.  The patient, with a known history of grade  2 cystocele and interstitial cystitis presents with pelvic pressure that started a week and a half ago. The pressure is located in the lower pelvic region and sometimes radiates to the rectum. The sensation is described as similar to the feeling right before childbirth. The patient denies any burning or itching. She has been waking up at night to urinate once, following advice from a previous doctor. The patient reports feeling generally unwell, drained, and lacking energy. She also reports passing a lot of gas after starting medication for yeast infection prescribed by another provider. The patient denies having diarrhea. She also reports feeling bloated. Of note, she is followed by Dr. Caralyn Guile.      Health Maintenance Due  Topic Date Due   Hepatitis C Screening  Never done   Zoster Vaccines- Shingrix (1 of 2) Never done   DEXA SCAN  10/14/2017   COVID-19 Vaccine (4 - 2023-24 season) 09/09/2021   DTaP/Tdap/Td (2 - Td or Tdap) 07/23/2022   INFLUENZA VACCINE  08/10/2022    Past Medical History:  Diagnosis Date   Anxiety disorder    hx vasovagal responses   Headache(784.0)    History of kidney stones    Hydronephrosis, right    Hyperlipidemia    Hypertension    Nephrolithiasis    Positive nasal culture for methicillin resistant Staphylococcus aureus    Right ureteral stone    Thrombophlebitis of leg, left, superficial 11/17/2020   Urinary incontinence    sees Dr. McDiarmid    Vasovagal syncope 08/02/2022    Past Surgical History:  Procedure Laterality Date    COLONOSCOPY  12/28/2015   per Dr. Adela Lank, serrated polyps and diverticula, repeat in 3 yrs    CYSTOSCOPY  06/09/2011   Procedure: CYSTOSCOPY;  Surgeon: Martina Sinner, MD;  Location: [facility];  Service: Urology;  Laterality: N/A;   CYSTOSCOPY W/ URETERAL STENT PLACEMENT  02/03/2014   Procedure: CYSTOSCOPY WITH  RIGHT RETROGRADE PYELOGRAM/ RIGHT URETERAL STENT PLACEMENT;  Surgeon: Anner Crete, MD;  Location: [facility];  Service: Urology;;   CYSTOSCOPY WITH BIOPSY  02/03/2014   Procedure: CYSTOSCOPY WITH BIOPSY;  Surgeon: Anner Crete, MD;  Location: [facility];  Service: Urology;;   PILONIDAL CYST EXCISION  1974   RECTOCELE REPAIR  06/09/2011   Procedure: POSTERIOR REPAIR (RECTOCELE);  Surgeon: Martina Sinner, MD;  Location: [facility];  Service: Urology;  Laterality: N/A;  Xenform graft 6x10   RIGHT URETEROSCOPIC STONE EXTRACTION / STENT PLACEMENT  03-13-2000   URETEROSCOPY Right 02/03/2014   Procedure: URETEROSCOPY WITH MANAGEMENT OF URETERAL STRICTURE;  Surgeon: Anner Crete, MD;  Location[facility];  Service: Urology;  Laterality: Right;   VAGINAL HYSTERECTOMY  06/09/2011   Procedure: HYSTERECTOMY VAGINAL;  Surgeon: Mickel Baas, MD;  Location: [facility];  Service: Gynecology;  Laterality: N/A;   VAGINAL PROLAPSE REPAIR  06/09/2011   Procedure: VAGINAL VAULT SUSPENSION;  Surgeon: Martina Sinner, MD;  Location: [facility];  Service: Urology;  Laterality: N/A;   VEIN LIGATION AND STRIPPING     LEFT LEG    Family History  Problem Relation Age of Onset   Colon cancer Mother 22   Colon polyps Father    Cancer Father        kidney, bladder    Colon polyps Brother    Coronary artery disease Other    Diabetes Other    Hyperlipidemia Other    Kidney disease Other    Cancer Other        lung   Esophageal cancer Neg Hx    Stomach cancer Neg Hx    Rectal cancer Neg Hx     Social History   Socioeconomic History   Marital status:  Married    Spouse name: Not on file   Number of children: Not on file   Years of education: Not on file   Highest education level: Not on file  Occupational History   Not on file  Tobacco Use   Smoking status: Former    Types: Cigarettes   Smokeless tobacco: Never  Vaping Use   Vaping status: Never Used  Substance and Sexual Activity   Alcohol use: Yes    Comment: once a month   Drug use: No   Sexual activity: Not Currently    Partners: Male  Other Topics Concern   Not on file  Social History Narrative   Married to a Truck driver   2 grown daughters   One daughter in [place]- no children, divorced   One daughter in [place], 3 children   Went to [employer] in 2020, [place]   Looking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   Enjoys quilting/cross stitch, motor home, travelling   2 dogs    Best friend is Therapist, nutritional Gum   Social Determinants of Health   Financial Resource Strain: Low Risk  (10/28/2021)   Overall Financial Resource Strain (CARDIA)    Difficulty of Paying Living Expenses: Not hard at all  Food Insecurity: No Food Insecurity (10/28/2021)   Hunger Vital Sign    Worried About Running Out of Food in the Last Year: Never true    Ran Out of Food in the Last Year: Never true  Transportation Needs: No Transportation Needs (10/28/2021)   PRAPARE - Administrator, Civil Service (Medical): No    Lack of Transportation (Non-Medical): No  Physical Activity: Inactive (10/28/2021)   Exercise Vital Sign    Days of Exercise per Week: 0 days    Minutes of Exercise per Session: 0 min  Stress: No Stress Concern Present (10/28/2021)   [employer] of Occupational Health - Occupational Stress Questionnaire    Feeling of Stress : Not at all  Social Connections: Socially Integrated (10/28/2021)   Social Connection and Isolation Panel [NHANES]    Frequency of Communication with Friends and Family: More than three times a week    Frequency of Social Gatherings with Friends and  Family: More than three times a week    Attends Religious Services: More than 4 times per year    Active Member of [employer] or Organizations: Yes    Attends Banker Meetings: More than 4 times per year    Marital Status: Married  Catering manager Violence: Not At Risk (10/28/2021)   Humiliation, Afraid, Rape, and Kick questionnaire    Fear of Current or Ex-Partner: No    Emotionally Abused: No    Physically Abused: No    Sexually Abused: No  Outpatient Medications Prior to Visit  Medication Sig Dispense Refill   aspirin EC 81 MG tablet Take 81 mg by mouth daily. Swallow whole.     hydrochlorothiazide (HYDRODIURIL) 25 MG tablet Take 1 tablet (25 mg total) by mouth daily. 90 tablet 0   valsartan (DIOVAN) 40 MG tablet Take 1 tablet (40 mg total) by mouth daily. 90 tablet 0   No facility-administered medications prior to visit.    Allergies  Allergen Reactions   Lidocaine Anaphylaxis, Shortness Of Breath and Other (See Comments)    "Eyes crossed"   Escitalopram     Sleepy   Levofloxacin Itching and Other (See Comments)    disoriented   Oxycodone-Aspirin Other (See Comments)    "VERY DIZZY"    ROS    See HPI Objective:    Physical Exam Exam conducted with a chaperone present.  Constitutional:      General: She is not in acute distress.    Appearance: Normal appearance. She is well-developed.  HENT:     Head: Normocephalic and atraumatic.     Right Ear: External ear normal.     Left Ear: External ear normal.  Eyes:     General: No scleral icterus. Neck:     Thyroid: No thyromegaly.  Cardiovascular:     Rate and Rhythm: Normal rate and regular rhythm.     Heart sounds: Normal heart sounds. No murmur heard. Pulmonary:     Effort: Pulmonary effort is normal. No respiratory distress.     Breath sounds: Normal breath sounds. No wheezing.  Abdominal:     Comments: No significant abdominal pain on exam  Genitourinary:    General: Normal vulva.     Labia:         Right: No rash.        Left: No rash.      Adnexa: Right adnexa normal and left adnexa normal.       Right: No mass.         Left: No mass.       Comments: Grade 2 cystocele Musculoskeletal:     Cervical back: Neck supple.  Skin:    General: Skin is warm and dry.  Neurological:     Mental Status: She is alert and oriented to person, place, and time.  Psychiatric:        Mood and Affect: Mood normal.        Behavior: Behavior normal.        Thought Content: Thought content normal.        Judgment: Judgment normal.      BP 116/71 (BP Location: Left Arm, Patient Position: Sitting, Cuff Size: Large)   Pulse 94   Temp 98.2 F (36.8 C) (Oral)   Resp 16   Wt 212 lb 4.8 oz (96.3 kg)   SpO2 100%   BMI 34.27 kg/m  Wt Readings from Last 3 Encounters:  08/22/22 212 lb 4.8 oz (96.3 kg)  08/07/22 214 lb (97.1 kg)  06/30/22 214 lb (97.1 kg)       Assessment & Plan:   Problem List Items Addressed This Visit       Unprioritized   Pelvic pressure in female - Primary    New.  New onset pelvic pressure for 1.5 weeks. No urinary symptoms. Noted to have a grade 2 cystocele on exam. Urinalysis shows + leukocytes.  -Start empiric antibiotic for possible urinary tract infection. -Send urine for culture. -If culture negative, consider interstitial cystitis + cystocele  as cause of symptoms.      Relevant Orders   POCT Urinalysis Dipstick (Automated) (Completed)   Urine Culture    I am having Megan Crane start on cephALEXin. I am also having her maintain her aspirin EC, valsartan, and hydrochlorothiazide.  Meds ordered this encounter  Medications   cephALEXin (KEFLEX) 500 MG capsule    Sig: Take 1 capsule (500 mg total) by mouth 2 (two) times daily.    Dispense:  10 capsule    Refill:  0    Order Specific Question:   Supervising Provider    Answer:   Danise Edge A [[id number]]

## 2022-08-24 ENCOUNTER — Telehealth: Payer: Self-pay | Admitting: Family

## 2022-08-24 MED ORDER — AMITRIPTYLINE HCL 25 MG PO TABS: ORAL_TABLET | ORAL | 0 refills | Status: DC

## 2022-08-24 NOTE — Telephone Encounter (Signed)
Pt called stating that she had gotten an appt with Dr. Lavella Hammock to address this on 10.9.24

## 2022-08-24 NOTE — Telephone Encounter (Signed)
I think that the pressure is her cystocele.  I would recommend that she follow back up with Dr. Ashley Royalty for further evaluation.   I doubt that the diarrhea is related to the pelvic pressure.  It may be due to the Keflex we gave her.  The Urine culture showed a small amount of bacteria.  I think she can stop the keflex after 3 days.  Let me know if diarrhea worsens.

## 2022-08-24 NOTE — Telephone Encounter (Signed)
Pt states her pelvic/rectum pressure is still persistent. She states she also is starting to have diarrhea and does not know if that is related. She states no burning or pain when peeing but wants to know what she should do. Please advise.

## 2022-08-24 NOTE — Telephone Encounter (Signed)
Patient notified of provider's advise and recommendations also instructions on new medication. She verbalized understanding.

## 2022-08-24 NOTE — Telephone Encounter (Signed)
I recommend that she limit caffeine and alcohol as these can worsen IC pain.  We can try a medication call Elavil at bedtime to see if this helps. If she finds that the medication is making her sleepy she does not need to increase the dose any further.  Let me know how she does.

## 2022-08-25 ENCOUNTER — Telehealth: Payer: Self-pay | Admitting: Family Medicine

## 2022-08-25 DIAGNOSIS — H9201 Otalgia, right ear: Secondary | ICD-10-CM

## 2022-08-25 NOTE — Telephone Encounter (Signed)
Pt call and want to know if dr.Fry will please  take her back because they can't take care of her at [facility]  also stated she will never leave him again.

## 2022-08-25 NOTE — Telephone Encounter (Signed)
OK with me if patient transfers back to Dr. Clent Ridges.

## 2022-08-25 NOTE — Telephone Encounter (Signed)
Yes I will be glad to see her again

## 2022-08-25 NOTE — Telephone Encounter (Signed)
Pt is calling again and saw dr fry in jan 2024 and just saw [name] on 08-22-2022

## 2022-08-29 DIAGNOSIS — M1711 Unilateral primary osteoarthritis, right knee: Secondary | ICD-10-CM | POA: Diagnosis not present

## 2022-08-29 NOTE — Progress Notes (Signed)
Surgery orders requested via Epic inbox. °

## 2022-08-30 NOTE — Telephone Encounter (Signed)
Pt is aware and is scheduled to see Dr Clent Ridges on 09/04/22

## 2022-08-31 ENCOUNTER — Ambulatory Visit: Payer: Self-pay | Admitting: Emergency Medicine

## 2022-08-31 DIAGNOSIS — G8929 Other chronic pain: Secondary | ICD-10-CM

## 2022-08-31 NOTE — H&P (Signed)
PARTIAL KNEE ADMISSION H&P  Patient is being admitted for right partial knee arthroplasty.  Subjective:  Chief Complaint:right knee pain.  HPI: Megan Crane, 72 y.o. female, has a history of pain and functional disability in the right knee due to arthritis and has failed non-surgical conservative treatments for greater than 12 weeks to includeNSAID's and/or analgesics, supervised PT with diminished ADL's post treatment, and activity modification.  Onset of symptoms was gradual, starting >10 years ago with gradually worsening course since that time. The patient noted no past surgery on the right knee(s).  Patient currently rates pain in the right knee(s) at 5 out of 10 with activity. Patient has night pain, worsening of pain with activity and weight bearing, pain that interferes with activities of daily living, and pain with passive range of motion.  Patient has evidence of  osteophytosis and joint space narrowing of the medial compartment  by imaging studies.  There is no active infection.  Patient Active Problem List   Diagnosis Date Noted   Pelvic pressure in female 08/22/2022   Visit for pre-operative examination 08/07/2022   Vasovagal syncope 08/02/2022   Nasal dryness 06/30/2022   Precancerous skin lesion 06/30/2022   Bilateral carpal tunnel syndrome 03/02/2022   Swelling of both hands 03/02/2022   Atypical chest pain 03/02/2022   OAB (overactive bladder) 11/23/2021   Corn of foot 11/23/2021   Atrophic vaginitis 11/23/2021   COVID-19 virus infection 11/17/2020   Raynaud's phenomenon without gangrene 11/17/2020   Primary osteoarthritis involving multiple joints 11/24/2019   GERD (gastroesophageal reflux disease) 12/26/2016   Edema 05/29/2014   Nasal colonization with methicillin-resistant Staphylococcus aureus 01/22/2014   Varicose veins of bilateral lower extremities with other complications 12/11/2012   Allergic rhinitis 11/29/2009   MOTION SICKNESS 11/29/2009    Hyperlipidemia 08/31/2008   Anxiety state 08/31/2008   Primary hypertension 08/31/2008   PILONIDAL CYST 08/31/2008   HEADACHE 08/31/2008   URINARY INCONTINENCE 08/31/2008   NEPHROLITHIASIS, HX OF 08/31/2008   POSTNASAL DRIP SYNDROME 05/28/2006   Past Medical History:  Diagnosis Date   Anxiety disorder    hx vasovagal responses   Headache(784.0)    History of kidney stones    Hydronephrosis, right    Hyperlipidemia    Hypertension    Nephrolithiasis    Positive nasal culture for methicillin resistant Staphylococcus aureus    Right ureteral stone    Thrombophlebitis of leg, left, superficial 11/17/2020   Urinary incontinence    sees Dr. McDiarmid    Vasovagal syncope 08/02/2022    Past Surgical History:  Procedure Laterality Date   COLONOSCOPY  12/28/2015   per Dr. Adela Lank, serrated polyps and diverticula, repeat in 3 yrs    CYSTOSCOPY  06/09/2011   Procedure: CYSTOSCOPY;  Surgeon: Martina Sinner, MD;  Location: [facility];  Service: Urology;  Laterality: N/A;   CYSTOSCOPY W/ URETERAL STENT PLACEMENT  02/03/2014   Procedure: CYSTOSCOPY WITH  RIGHT RETROGRADE PYELOGRAM/ RIGHT URETERAL STENT PLACEMENT;  Surgeon: Anner Crete, MD;  Location: [facility];  Service: Urology;;   CYSTOSCOPY WITH BIOPSY  02/03/2014   Procedure: CYSTOSCOPY WITH BIOPSY;  Surgeon: Anner Crete, MD;  Location: [facility];  Service: Urology;;   PILONIDAL CYST EXCISION  1974   RECTOCELE REPAIR  06/09/2011   Procedure: POSTERIOR REPAIR (RECTOCELE);  Surgeon: Martina Sinner, MD;  Location: [facility];  Service: Urology;  Laterality: N/A;  Xenform graft 6x10   RIGHT URETEROSCOPIC STONE EXTRACTION / STENT PLACEMENT  03-13-2000   URETEROSCOPY Right 02/03/2014   Procedure: URETEROSCOPY WITH MANAGEMENT OF URETERAL STRICTURE;  Surgeon: Anner Crete, MD;  Location: [facility];  Service: Urology;  Laterality: Right;   VAGINAL HYSTERECTOMY  06/09/2011   Procedure:  HYSTERECTOMY VAGINAL;  Surgeon: Mickel Baas, MD;  Location: [facility];  Service: Gynecology;  Laterality: N/A;   VAGINAL PROLAPSE REPAIR  06/09/2011   Procedure: VAGINAL VAULT SUSPENSION;  Surgeon: Martina Sinner, MD;  Location: [facility];  Service: Urology;  Laterality: N/A;   VEIN LIGATION AND STRIPPING     LEFT LEG    Current Outpatient Medications  Medication Sig Dispense Refill Last Dose   amitriptyline (ELAVIL) 25 MG tablet 1/2 tab once daily at bedtime for 1 week, then 1 tab once daily for 1 week, then 2 tabs once daily for 1 week, then 3 tabs once daily. (Patient not taking: Reported on 08/28/2022) 90 tablet 0    aspirin 81 MG chewable tablet Chew 81 mg by mouth daily.      cephALEXin (KEFLEX) 500 MG capsule Take 1 capsule (500 mg total) by mouth 2 (two) times daily. 10 capsule 0    estradiol (ESTRACE) 0.1 MG/GM vaginal cream Place 1 Applicatorful vaginally 2 (two) times a week.      hydrochlorothiazide (HYDRODIURIL) 25 MG tablet Take 1 tablet (25 mg total) by mouth daily. 90 tablet 0    valsartan (DIOVAN) 40 MG tablet Take 1 tablet (40 mg total) by mouth daily. 90 tablet 0    No current facility-administered medications for this visit.   Allergies  Allergen Reactions   Lidocaine Anaphylaxis, Shortness Of Breath and Other (See Comments)    "Eyes crossed"   Escitalopram     Sleepy   Levofloxacin Itching and Other (See Comments)    disoriented   Oxycodone-Aspirin Other (See Comments)    "VERY DIZZY"    Social History   Tobacco Use   Smoking status: Former    Types: Cigarettes   Smokeless tobacco: Never  Substance Use Topics   Alcohol use: Yes    Comment: once a month    Family History  Problem Relation Age of Onset   Colon cancer Mother 10   Colon polyps Father    Cancer Father        kidney, bladder    Colon polyps Brother    Coronary artery disease Other    Diabetes Other    Hyperlipidemia Other    Kidney disease Other    Cancer Other        lung    Esophageal cancer Neg Hx    Stomach cancer Neg Hx    Rectal cancer Neg Hx      Review of Systems  Musculoskeletal:  Positive for arthralgias.  All other systems reviewed and are negative.   Objective:  Physical Exam Constitutional:      General: She is not in acute distress.    Appearance: Normal appearance. She is not ill-appearing.  HENT:     Head: Normocephalic and atraumatic.     Right Ear: External ear normal.     Left Ear: External ear normal.     Nose: Nose normal.     Mouth/Throat:     Mouth: Mucous membranes are moist.     Pharynx: Oropharynx is clear.  Eyes:     Extraocular Movements: Extraocular movements intact.     Conjunctiva/sclera: Conjunctivae normal.  Cardiovascular:     Rate and Rhythm: Normal rate and  regular rhythm.     Pulses: Normal pulses.     Heart sounds: Normal heart sounds.  Pulmonary:     Effort: Pulmonary effort is normal.     Breath sounds: Normal breath sounds.  Abdominal:     General: Bowel sounds are normal.     Palpations: Abdomen is soft.     Tenderness: There is no abdominal tenderness.  Musculoskeletal:        General: Tenderness present.     Cervical back: Normal range of motion and neck supple.     Comments: TTP over medial joint line.  No calf tenderness, swelling, or erythema.  No overlying lesions of area of chief complaint.  Decreased strength and ROM due to elicited pain.  Pre-operative ROM 2-115.  Dorsiflexion and plantarflexion intact.  Stable to varus and valgus stress.  BLE appear grossly neurovascularly intact.  Gait minimally antalgic.   Skin:    General: Skin is warm and dry.  Neurological:     Mental Status: She is alert and oriented to person, place, and time. Mental status is at baseline.  Psychiatric:        Mood and Affect: Mood normal.        Behavior: Behavior normal.     Vital signs in last 24 hours: @VSRANGES @  Labs:   Estimated body mass index is 34.27 kg/m as calculated from the following:    Height as of 03/29/22: 5\' 6"  (1.676 m).   Weight as of 08/22/22: 96.3 kg.   Imaging Review Plain radiographs demonstrate  moderate-severe  degenerative joint disease of the right knee(s) isolated to medial compartment. The overall alignment ismild varus. The bone quality appears to be good for age and reported activity level.      Assessment/Plan:  End stage arthritis, right knee   The patient history, physical examination, clinical judgment of the provider and imaging studies are consistent with end stage degenerative joint disease of the right knee in the medial compartment and partial knee arthroplasty is deemed medically necessary. The treatment options including medical management, injection therapy arthroscopy and arthroplasty were discussed at length. The risks and benefits of partial knee arthroplasty were presented and reviewed. Discussed may transition to total knee arthroplasty depending on severity of findings of surgery. The risks due to aseptic loosening, infection, stiffness, patella tracking problems, thromboembolic complications and other imponderables were discussed. The patient acknowledged the explanation, agreed to proceed with the plan and consent was signed. Patient is being admitted for inpatient treatment for surgery, pain control, PT, OT, prophylactic antibiotics, VTE prophylaxis, progressive ambulation and ADL's and discharge planning. The patient is planning to be discharged home with outpatient PT.     Patient's anticipated LOS is less than 2 midnights, meeting these requirements: - Younger than 26 - Lives within 1 hour of care - Has a competent adult at home to recover with post-op recover - NO history of  - Chronic pain requiring opiods  - Diabetes  - Coronary Artery Disease  - Heart failure  - Heart attack  - Stroke  - DVT/VTE  - Cardiac arrhythmia  - Respiratory Failure/COPD  - Renal failure  - Anemia  - Advanced Liver disease

## 2022-08-31 NOTE — H&P (View-Only) (Signed)
PARTIAL KNEE ADMISSION H&P  Patient is being admitted for right partial knee arthroplasty.  Subjective:  Chief Complaint:right knee pain.  HPI: Megan Crane, 72 y.o. female, has a history of pain and functional disability in the right knee due to arthritis and has failed non-surgical conservative treatments for greater than 12 weeks to includeNSAID's and/or analgesics, supervised PT with diminished ADL's post treatment, and activity modification.  Onset of symptoms was gradual, starting >10 years ago with gradually worsening course since that time. The patient noted no past surgery on the right knee(s).  Patient currently rates pain in the right knee(s) at 5 out of 10 with activity. Patient has night pain, worsening of pain with activity and weight bearing, pain that interferes with activities of daily living, and pain with passive range of motion.  Patient has evidence of  osteophytosis and joint space narrowing of the medial compartment  by imaging studies.  There is no active infection.  Patient Active Problem List   Diagnosis Date Noted   Pelvic pressure in female 08/22/2022   Visit for pre-operative examination 08/07/2022   Vasovagal syncope 08/02/2022   Nasal dryness 06/30/2022   Precancerous skin lesion 06/30/2022   Bilateral carpal tunnel syndrome 03/02/2022   Swelling of both hands 03/02/2022   Atypical chest pain 03/02/2022   OAB (overactive bladder) 11/23/2021   Corn of foot 11/23/2021   Atrophic vaginitis 11/23/2021   COVID-19 virus infection 11/17/2020   Raynaud's phenomenon without gangrene 11/17/2020   Primary osteoarthritis involving multiple joints 11/24/2019   GERD (gastroesophageal reflux disease) 12/26/2016   Edema 05/29/2014   Nasal colonization with methicillin-resistant Staphylococcus aureus 01/22/2014   Varicose veins of bilateral lower extremities with other complications 12/11/2012   Allergic rhinitis 11/29/2009   MOTION SICKNESS 11/29/2009    Hyperlipidemia 08/31/2008   Anxiety state 08/31/2008   Primary hypertension 08/31/2008   PILONIDAL CYST 08/31/2008   HEADACHE 08/31/2008   URINARY INCONTINENCE 08/31/2008   NEPHROLITHIASIS, HX OF 08/31/2008   POSTNASAL DRIP SYNDROME 05/28/2006   Past Medical History:  Diagnosis Date   Anxiety disorder    hx vasovagal responses   Headache(784.0)    History of kidney stones    Hydronephrosis, right    Hyperlipidemia    Hypertension    Nephrolithiasis    Positive nasal culture for methicillin resistant Staphylococcus aureus    Right ureteral stone    Thrombophlebitis of leg, left, superficial 11/17/2020   Urinary incontinence    sees Dr. McDiarmid    Vasovagal syncope 08/02/2022    Past Surgical History:  Procedure Laterality Date   COLONOSCOPY  12/28/2015   per Dr. Adela Lank, serrated polyps and diverticula, repeat in 3 yrs    CYSTOSCOPY  06/09/2011   Procedure: CYSTOSCOPY;  Surgeon: Martina Sinner, MD;  Location: [facility];  Service: Urology;  Laterality: N/A;   CYSTOSCOPY W/ URETERAL STENT PLACEMENT  02/03/2014   Procedure: CYSTOSCOPY WITH  RIGHT RETROGRADE PYELOGRAM/ RIGHT URETERAL STENT PLACEMENT;  Surgeon: Anner Crete, MD;  Location: [facility];  Service: Urology;;   CYSTOSCOPY WITH BIOPSY  02/03/2014   Procedure: CYSTOSCOPY WITH BIOPSY;  Surgeon: Anner Crete, MD;  Location: [facility];  Service: Urology;;   PILONIDAL CYST EXCISION  1974   RECTOCELE REPAIR  06/09/2011   Procedure: POSTERIOR REPAIR (RECTOCELE);  Surgeon: Martina Sinner, MD;  Location: [facility];  Service: Urology;  Laterality: N/A;  Xenform graft 6x10   RIGHT URETEROSCOPIC STONE EXTRACTION / STENT PLACEMENT  03-13-2000   URETEROSCOPY Right 02/03/2014   Procedure: URETEROSCOPY WITH MANAGEMENT OF URETERAL STRICTURE;  Surgeon: Anner Crete, MD;  Location: [facility];  Service: Urology;  Laterality: Right;   VAGINAL HYSTERECTOMY  06/09/2011   Procedure:  HYSTERECTOMY VAGINAL;  Surgeon: Mickel Baas, MD;  Location: [facility];  Service: Gynecology;  Laterality: N/A;   VAGINAL PROLAPSE REPAIR  06/09/2011   Procedure: VAGINAL VAULT SUSPENSION;  Surgeon: Martina Sinner, MD;  Location: [facility];  Service: Urology;  Laterality: N/A;   VEIN LIGATION AND STRIPPING     LEFT LEG    Current Outpatient Medications  Medication Sig Dispense Refill Last Dose   amitriptyline (ELAVIL) 25 MG tablet 1/2 tab once daily at bedtime for 1 week, then 1 tab once daily for 1 week, then 2 tabs once daily for 1 week, then 3 tabs once daily. (Patient not taking: Reported on 08/28/2022) 90 tablet 0    aspirin 81 MG chewable tablet Chew 81 mg by mouth daily.      cephALEXin (KEFLEX) 500 MG capsule Take 1 capsule (500 mg total) by mouth 2 (two) times daily. 10 capsule 0    estradiol (ESTRACE) 0.1 MG/GM vaginal cream Place 1 Applicatorful vaginally 2 (two) times a week.      hydrochlorothiazide (HYDRODIURIL) 25 MG tablet Take 1 tablet (25 mg total) by mouth daily. 90 tablet 0    valsartan (DIOVAN) 40 MG tablet Take 1 tablet (40 mg total) by mouth daily. 90 tablet 0    No current facility-administered medications for this visit.   Allergies  Allergen Reactions   Lidocaine Anaphylaxis, Shortness Of Breath and Other (See Comments)    "Eyes crossed"   Escitalopram     Sleepy   Levofloxacin Itching and Other (See Comments)    disoriented   Oxycodone-Aspirin Other (See Comments)    "VERY DIZZY"    Social History   Tobacco Use   Smoking status: Former    Types: Cigarettes   Smokeless tobacco: Never  Substance Use Topics   Alcohol use: Yes    Comment: once a month    Family History  Problem Relation Age of Onset   Colon cancer Mother 10   Colon polyps Father    Cancer Father        kidney, bladder    Colon polyps Brother    Coronary artery disease Other    Diabetes Other    Hyperlipidemia Other    Kidney disease Other    Cancer Other        lung    Esophageal cancer Neg Hx    Stomach cancer Neg Hx    Rectal cancer Neg Hx      Review of Systems  Musculoskeletal:  Positive for arthralgias.  All other systems reviewed and are negative.   Objective:  Physical Exam Constitutional:      General: She is not in acute distress.    Appearance: Normal appearance. She is not ill-appearing.  HENT:     Head: Normocephalic and atraumatic.     Right Ear: External ear normal.     Left Ear: External ear normal.     Nose: Nose normal.     Mouth/Throat:     Mouth: Mucous membranes are moist.     Pharynx: Oropharynx is clear.  Eyes:     Extraocular Movements: Extraocular movements intact.     Conjunctiva/sclera: Conjunctivae normal.  Cardiovascular:     Rate and Rhythm: Normal rate and  regular rhythm.     Pulses: Normal pulses.     Heart sounds: Normal heart sounds.  Pulmonary:     Effort: Pulmonary effort is normal.     Breath sounds: Normal breath sounds.  Abdominal:     General: Bowel sounds are normal.     Palpations: Abdomen is soft.     Tenderness: There is no abdominal tenderness.  Musculoskeletal:        General: Tenderness present.     Cervical back: Normal range of motion and neck supple.     Comments: TTP over medial joint line.  No calf tenderness, swelling, or erythema.  No overlying lesions of area of chief complaint.  Decreased strength and ROM due to elicited pain.  Pre-operative ROM 2-115.  Dorsiflexion and plantarflexion intact.  Stable to varus and valgus stress.  BLE appear grossly neurovascularly intact.  Gait minimally antalgic.   Skin:    General: Skin is warm and dry.  Neurological:     Mental Status: She is alert and oriented to person, place, and time. Mental status is at baseline.  Psychiatric:        Mood and Affect: Mood normal.        Behavior: Behavior normal.     Vital signs in last 24 hours: @VSRANGES @  Labs:   Estimated body mass index is 34.27 kg/m as calculated from the following:    Height as of 03/29/22: 5\' 6"  (1.676 m).   Weight as of 08/22/22: 96.3 kg.   Imaging Review Plain radiographs demonstrate  moderate-severe  degenerative joint disease of the right knee(s) isolated to medial compartment. The overall alignment ismild varus. The bone quality appears to be good for age and reported activity level.      Assessment/Plan:  End stage arthritis, right knee   The patient history, physical examination, clinical judgment of the provider and imaging studies are consistent with end stage degenerative joint disease of the right knee in the medial compartment and partial knee arthroplasty is deemed medically necessary. The treatment options including medical management, injection therapy arthroscopy and arthroplasty were discussed at length. The risks and benefits of partial knee arthroplasty were presented and reviewed. Discussed may transition to total knee arthroplasty depending on severity of findings of surgery. The risks due to aseptic loosening, infection, stiffness, patella tracking problems, thromboembolic complications and other imponderables were discussed. The patient acknowledged the explanation, agreed to proceed with the plan and consent was signed. Patient is being admitted for inpatient treatment for surgery, pain control, PT, OT, prophylactic antibiotics, VTE prophylaxis, progressive ambulation and ADL's and discharge planning. The patient is planning to be discharged home with outpatient PT.     Patient's anticipated LOS is less than 2 midnights, meeting these requirements: - Younger than 26 - Lives within 1 hour of care - Has a competent adult at home to recover with post-op recover - NO history of  - Chronic pain requiring opiods  - Diabetes  - Coronary Artery Disease  - Heart failure  - Heart attack  - Stroke  - DVT/VTE  - Cardiac arrhythmia  - Respiratory Failure/COPD  - Renal failure  - Anemia  - Advanced Liver disease

## 2022-09-01 NOTE — Progress Notes (Signed)
Anesthesia Review:  PCP: Golden Pop LOV 08/22/22  Cardiologist : Laurance Flatten LOV 03/29/22  Chest x-ray : 08/10/22- 2 view  EKG : 08/07/22  CT Card- 04/05/22  Echo : Stress test: 2017  Cardiac Cath :  Activity level: can do a flgiht of stairs without difficutly  Sleep Study/ CPAP : none  Fasting Blood Sugar :      / Checks Blood Sugar -- times a day:   Blood Thinner/ Instructions /Last Dose: ASA / Instructions/ Last Dose :    81 mg aspirin    Hgba1c-08/07/22- 5.8    Hx of vasovagal response x 1 per pt - hx of years ago when ear procedure was done .   PT reports at preop she has had some slight twitch to her right side of mouth that occurs intermittently and started 2-3 weeks ago.     Hx of blood clot in left leg after Covid 3 years ago

## 2022-09-04 ENCOUNTER — Encounter: Payer: Self-pay | Admitting: Family Medicine

## 2022-09-04 ENCOUNTER — Ambulatory Visit (INDEPENDENT_AMBULATORY_CARE_PROVIDER_SITE_OTHER): Payer: Medicare Other | Admitting: Family Medicine

## 2022-09-04 VITALS — BP 120/78 | HR 80 | Temp 98.4°F | Ht 66.0 in | Wt 213.6 lb

## 2022-09-04 DIAGNOSIS — H6991 Unspecified Eustachian tube disorder, right ear: Secondary | ICD-10-CM | POA: Diagnosis not present

## 2022-09-04 DIAGNOSIS — N3010 Interstitial cystitis (chronic) without hematuria: Secondary | ICD-10-CM | POA: Insufficient documentation

## 2022-09-04 DIAGNOSIS — N3281 Overactive bladder: Secondary | ICD-10-CM | POA: Diagnosis not present

## 2022-09-04 MED ORDER — OXYBUTYNIN CHLORIDE ER 5 MG PO TB24: 5.0000 mg | ORAL_TABLET | Freq: Every day | ORAL | 5 refills | Status: AC

## 2022-09-04 NOTE — Patient Instructions (Signed)
SURGICAL WAITING ROOM VISITATION  Patients having surgery or a procedure may have no more than 2 support people in the waiting area - these visitors may rotate.    Children under the age of 73 must have an adult with them who is not the patient.  Due to an increase in RSV and influenza rates and associated hospitalizations, children ages 96 and under may not visit patients in [facility] hospitals.  If the patient needs to stay at the hospital during part of their recovery, the visitor guidelines for inpatient rooms apply. Pre-op nurse will coordinate an appropriate time for 1 support person to accompany patient in pre-op.  This support person may not rotate.    Please refer to the [facility] website for the visitor guidelines for Inpatients (after your surgery is over and you are in a regular room).       Your procedure is scheduled on:  09/18/2022    Report to [facility] Main Entrance    Report to admitting at  0515 AM   Call this number if you have problems the morning of surgery [phone]   Do not eat food :After Midnight.   After Midnight you may have the following liquids until __ 0430____ AM  DAY OF SURGERY  Water Non-Citrus Juices (without pulp, NO RED-Apple, White grape, White cranberry) Black Coffee (NO MILK/CREAM OR CREAMERS, sugar ok)  Clear Tea (NO MILK/CREAM OR CREAMERS, sugar ok) regular and decaf                             Plain Jell-O (NO RED)                                           Fruit ices (not with fruit pulp, NO RED)                                     Popsicles (NO RED)                                                               Sports drinks like Gatorade (NO RED)                    The day of surgery:  Drink ONE (1) Pre-Surgery Clear Ensure or G2 at  0430AM ( have completed by )  the morning of surgery. Drink in one sitting. Do not sip.  This drink was given to you during your hospital  pre-op appointment visit. Nothing else to  drink after completing the  Pre-Surgery Clear Ensure or G2.          If you have questions, please contact your surgeon's office.        Oral Hygiene is also important to reduce your risk of infection.                                    Remember - BRUSH YOUR TEETH THE MORNING OF SURGERY  WITH YOUR REGULAR TOOTHPASTE  DENTURES WILL BE REMOVED PRIOR TO SURGERY PLEASE DO NOT APPLY "Poly grip" OR ADHESIVES!!!   Do NOT smoke after Midnight   Stop all vitamins and herbal supplements 7 days before surgery.   Take these medicines the morning of surgery with A SIP OF WATER:  none   DO NOT TAKE ANY ORAL DIABETIC MEDICATIONS DAY OF YOUR SURGERY  Bring CPAP mask and tubing day of surgery.                              You may not have any metal on your body including hair pins, jewelry, and body piercing             Do not wear make-up, lotions, powders, perfumes/cologne, or deodorant  Do not wear nail polish including gel and S&S, artificial/acrylic nails, or any other type of covering on natural nails including finger and toenails. If you have artificial nails, gel coating, etc. that needs to be removed by a nail salon please have this removed prior to surgery or surgery may need to be canceled/ delayed if the surgeon/ anesthesia feels like they are unable to be safely monitored.   Do not shave  48 hours prior to surgery.               Men may shave face and neck.   Do not bring valuables to the hospital. [facility] IS NOT             RESPONSIBLE   FOR VALUABLES.   Contacts, glasses, dentures or bridgework may not be worn into surgery.   Bring small overnight bag day of surgery.   DO NOT BRING YOUR HOME MEDICATIONS TO THE HOSPITAL. PHARMACY WILL DISPENSE MEDICATIONS LISTED ON YOUR MEDICATION LIST TO YOU DURING YOUR ADMISSION IN THE HOSPITAL!    Patients discharged on the day of surgery will not be allowed to drive home.  Someone NEEDS to stay with you for the first 24 hours after  anesthesia.   Special Instructions: Bring a copy of your healthcare power of attorney and living will documents the day of surgery if you haven't scanned them before.              Please read over the following fact sheets you were given: IF YOU HAVE QUESTIONS ABOUT YOUR PRE-OP INSTRUCTIONS PLEASE CALL [phone]   If you received a COVID test during your pre-op visit  it is requested that you wear a mask when out in public, stay away from anyone that may not be feeling well and notify your surgeon if you develop symptoms. If you test positive for Covid or have been in contact with anyone that has tested positive in the last 10 days please notify you surgeon.      Pre-operative 5 CHG Bath Instructions   You can play a key role in reducing the risk of infection after surgery. Your skin needs to be as free of germs as possible. You can reduce the number of germs on your skin by washing with CHG (chlorhexidine gluconate) soap before surgery. CHG is an antiseptic soap that kills germs and continues to kill germs even after washing.   DO NOT use if you have an allergy to chlorhexidine/CHG or antibacterial soaps. If your skin becomes reddened or irritated, stop using the CHG and notify one of our RNs at [phone].   Please shower with the CHG soap  starting 4 days before surgery using the following schedule:     Please keep in mind the following:  DO NOT shave, including legs and underarms, starting the day of your first shower.   You may shave your face at any point before/day of surgery.  Place clean sheets on your bed the day you start using CHG soap. Use a clean washcloth (not used since being washed) for each shower. DO NOT sleep with pets once you start using the CHG.   CHG Shower Instructions:  If you choose to wash your hair and private area, wash first with your normal shampoo/soap.  After you use shampoo/soap, rinse your hair and body thoroughly to remove shampoo/soap residue.   Turn the water OFF and apply about 3 tablespoons (45 ml) of CHG soap to a CLEAN washcloth.  Apply CHG soap ONLY FROM YOUR NECK DOWN TO YOUR TOES (washing for 3-5 minutes)  DO NOT use CHG soap on face, private areas, open wounds, or sores.  Pay special attention to the area where your surgery is being performed.  If you are having back surgery, having someone wash your back for you may be helpful. Wait 2 minutes after CHG soap is applied, then you may rinse off the CHG soap.  Pat dry with a clean towel  Put on clean clothes/pajamas   If you choose to wear lotion, please use ONLY the CHG-compatible lotions on the back of this paper.     Additional instructions for the day of surgery: DO NOT APPLY any lotions, deodorants, cologne, or perfumes.   Put on clean/comfortable clothes.  Brush your teeth.  Ask your nurse before applying any prescription medications to the skin.      CHG Compatible Lotions   Aveeno Moisturizing lotion  Cetaphil Moisturizing Cream  Cetaphil Moisturizing Lotion  Clairol Herbal Essence Moisturizing Lotion, Dry Skin  Clairol Herbal Essence Moisturizing Lotion, Extra Dry Skin  Clairol Herbal Essence Moisturizing Lotion, Normal Skin  Curel Age Defying Therapeutic Moisturizing Lotion with Alpha Hydroxy  Curel Extreme Care Body Lotion  Curel Soothing Hands Moisturizing Hand Lotion  Curel Therapeutic Moisturizing Cream, Fragrance-Free  Curel Therapeutic Moisturizing Lotion, Fragrance-Free  Curel Therapeutic Moisturizing Lotion, Original Formula  Eucerin Daily Replenishing Lotion  Eucerin Dry Skin Therapy Plus Alpha Hydroxy Crme  Eucerin Dry Skin Therapy Plus Alpha Hydroxy Lotion  Eucerin Original Crme  Eucerin Original Lotion  Eucerin Plus Crme Eucerin Plus Lotion  Eucerin TriLipid Replenishing Lotion  Keri Anti-Bacterial Hand Lotion  Keri Deep Conditioning Original Lotion Dry Skin Formula Softly Scented  Keri Deep Conditioning Original Lotion, Fragrance  Free Sensitive Skin Formula  Keri Lotion Fast Absorbing Fragrance Free Sensitive Skin Formula  Keri Lotion Fast Absorbing Softly Scented Dry Skin Formula  Keri Original Lotion  Keri Skin Renewal Lotion Keri Silky Smooth Lotion  Keri Silky Smooth Sensitive Skin Lotion  Nivea Body Creamy Conditioning Oil  Nivea Body Extra Enriched Teacher, adult education Moisturizing Lotion Nivea Crme  Nivea Skin Firming Lotion  NutraDerm 30 Skin Lotion  NutraDerm Skin Lotion  NutraDerm Therapeutic Skin Cream  NutraDerm Therapeutic Skin Lotion  ProShield Protective Hand Cream  Provon moisturizing lotion

## 2022-09-04 NOTE — Progress Notes (Signed)
   Subjective:    Patient ID: Megan Crane, female    DOB: Dec 14, 1950, 72 y.o.   MRN: [id number]  HPI Here for several issues. First she has had intermittent pains and a "fluid sensation" in the right ear for the past 3 days. No hearing loss. Second she asks for help with her interstitial cystitis and OAB. She periodically gets pain in the pelvic region with increased urge to urinate. No burning or fever. She is scheduled for a partial right knee replacement surgery on 09-18-22 per Dr. Weber Cooks, and they recently took a urine sample as part of her pre-op evaluation. There is no sign of a UTI.    Review of Systems  Constitutional: Negative.   HENT:  Positive for congestion, ear pain and sinus pressure. Negative for ear discharge, hearing loss, postnasal drip and sore throat.   Eyes: Negative.   Respiratory: Negative.    Cardiovascular: Negative.   Genitourinary:  Positive for frequency and urgency. Negative for dysuria, flank pain and hematuria.       Objective:   Physical Exam Constitutional:      Appearance: Normal appearance. She is well-developed.  HENT:     Right Ear: Tympanic membrane, ear canal and external ear normal.     Left Ear: Tympanic membrane, ear canal and external ear normal.     Nose: Nose normal.     Mouth/Throat:     Pharynx: Oropharynx is clear.  Eyes:     Conjunctiva/sclera: Conjunctivae normal.  Cardiovascular:     Rate and Rhythm: Normal rate and regular rhythm.     Pulses: Normal pulses.     Heart sounds: Normal heart sounds.  Pulmonary:     Effort: Pulmonary effort is normal.     Breath sounds: Normal breath sounds.  Abdominal:     Tenderness: There is no right CVA tenderness or left CVA tenderness.  Lymphadenopathy:     Cervical: No cervical adenopathy.  Neurological:     Mental Status: She is alert.           Assessment & Plan:  She has some eustachian tube dysfunction, and she can try Mucinex for this as needed. She also has a  combination of OAB and IC, so she will try taking Oxybutynin XL 5 mg daily. She can also add OTC magnesium as needed.  Gershon Crane, MD

## 2022-09-05 ENCOUNTER — Other Ambulatory Visit: Payer: Self-pay

## 2022-09-05 ENCOUNTER — Encounter (HOSPITAL_COMMUNITY): Payer: Self-pay

## 2022-09-05 ENCOUNTER — Encounter (HOSPITAL_COMMUNITY)
Admission: RE | Admit: 2022-09-05 | Discharge: 2022-09-05 | Disposition: A | Discharge status: Home / Self Care | Payer: Medicare Other | Source: Ambulatory Visit | Attending: Orthopedic Surgery | Admitting: Orthopedic Surgery

## 2022-09-05 VITALS — BP 129/83 | HR 76 | Temp 98.8°F | Resp 16 | Ht 66.0 in | Wt 213.4 lb

## 2022-09-05 DIAGNOSIS — Z01812 Encounter for preprocedural laboratory examination: Secondary | ICD-10-CM | POA: Diagnosis not present

## 2022-09-05 DIAGNOSIS — M25561 Pain in right knee: Secondary | ICD-10-CM | POA: Diagnosis not present

## 2022-09-05 DIAGNOSIS — Z01818 Encounter for other preprocedural examination: Secondary | ICD-10-CM

## 2022-09-05 DIAGNOSIS — G8929 Other chronic pain: Secondary | ICD-10-CM | POA: Diagnosis not present

## 2022-09-05 HISTORY — DX: Unspecified osteoarthritis, unspecified site: M19.90

## 2022-09-05 HISTORY — DX: Malignant (primary) neoplasm, unspecified: C80.1

## 2022-09-05 HISTORY — DX: Acute embolism and thrombosis of unspecified deep veins of unspecified lower extremity: I82.409

## 2022-09-05 HISTORY — DX: Pneumonia, unspecified organism: J18.9

## 2022-09-05 LAB — COMPREHENSIVE METABOLIC PANEL
ALT: 23 U/L (ref 0–44)
AST: 24 U/L (ref 15–41)
Albumin: 4 g/dL (ref 3.5–5.0)
Alkaline Phosphatase: 66 U/L (ref 38–126)
Anion gap: 7 (ref 5–15)
BUN: 20 mg/dL (ref 8–23)
CO2: 27 mmol/L (ref 22–32)
Calcium: 9.8 mg/dL (ref 8.9–10.3)
Chloride: 104 mmol/L (ref 98–111)
Creatinine, Ser: 0.79 mg/dL (ref 0.44–1.00)
GFR, Estimated: >60 mL/min (ref >60)
Glucose, Bld: 89 mg/dL (ref 70–99)
Potassium: 3.8 mmol/L (ref 3.5–5.1)
Sodium: 138 mmol/L (ref 135–145)
Total Bilirubin: 0.5 mg/dL (ref 0.3–1.2)
Total Protein: 7.1 g/dL (ref 6.5–8.1)

## 2022-09-05 LAB — SURGICAL PCR SCREEN
MRSA, PCR: NEGATIVE
Staphylococcus aureus: NEGATIVE

## 2022-09-05 LAB — CBC WITH DIFFERENTIAL/PLATELET
Abs Immature Granulocytes: 0.01 10*3/uL (ref 0.00–0.07)
Basophils Absolute: 0 10*3/uL (ref 0.0–0.1)
Basophils Relative: 1 %
Eosinophils Absolute: 0.1 10*3/uL (ref 0.0–0.5)
Eosinophils Relative: 2 %
HCT: 40.6 % (ref 36.0–46.0)
Hemoglobin: 13.3 g/dL (ref 12.0–15.0)
Immature Granulocytes: 0 %
Lymphocytes Relative: 27 %
Lymphs Abs: 1.6 10*3/uL (ref 0.7–4.0)
MCH: 29.8 pg (ref 26.0–34.0)
MCHC: 32.8 g/dL (ref 30.0–36.0)
MCV: 90.8 fL (ref 80.0–100.0)
Monocytes Absolute: 0.4 10*3/uL (ref 0.1–1.0)
Monocytes Relative: 8 %
Neutro Abs: 3.6 10*3/uL (ref 1.7–7.7)
Neutrophils Relative %: 62 %
Platelets: 204 10*3/uL (ref 150–400)
RBC: 4.47 MIL/uL (ref 3.87–5.11)
RDW: 13.3 % (ref 11.5–15.5)
WBC: 5.8 10*3/uL (ref 4.0–10.5)
nRBC: 0 % (ref 0.0–0.2)

## 2022-09-05 LAB — TYPE AND SCREEN
ABO/RH(D): O POS
Antibody Screen: NEGATIVE

## 2022-09-06 NOTE — Telephone Encounter (Signed)
Pt stated her ear is no better and it feel itchy ,scratchy crackers and feel like something is in it also feel sore down behind her ear. Pt want a call back to tell her what to do.

## 2022-09-07 NOTE — Telephone Encounter (Signed)
As I discussed with her, the next step should be for her to see ENT. Therefore I have done a referral to ENT

## 2022-09-08 ENCOUNTER — Ambulatory Visit: Payer: Medicare Other | Admitting: Family Medicine

## 2022-09-08 NOTE — Progress Notes (Signed)
Anesthesia Chart Review   Case: [id number] Date/Time: 09/18/22 0715   Procedure: UNICOMPARTMENTAL KNEE (Right: Knee)   Anesthesia type: Spinal   Pre-op diagnosis: OA RIGHT KNEE   Location: [facility] 08 / [facility]   Surgeons: Joen Laura, MD       DISCUSSION: 72 year old former smoker with history of HTN, DVT, vasovagal syncope, right knee OA scheduled for above procedure 09/18/2022 with Dr. Weber Cooks.  Patient seen by cardiology 08/02/2022 for preoperative evaluation.  Per office visit note, " Preoperative Cardiovascular Risk Assessment: According to the Revised Cardiac Risk Index (RCRI), her Perioperative Risk of Major Cardiac Event is (%): 0.4. Her Functional Capacity in METs is: 7.59 according to the Duke Activity Status Index (DASI). The patient is doing well from a cardiac perspective. Therefore, based on ACC/AHA guidelines, the patient would be at acceptable risk for the planned procedure without further cardiovascular testing.    The patient was advised that if she develops new symptoms prior to surgery to contact our office to arrange for a follow-up visit, and she verbalized understanding.   Recommendations for holding aspirin will need to come from prescribing provider. She is currently holding aspirin for 7 days as advised by surgeon."  VS: BP 129/83   Pulse 76   Temp 37.1 C (Oral)   Resp 16   Ht 5\' 6"  (1.676 m)   Wt 96.8 kg   SpO2 98%   BMI 34.44 kg/m   PROVIDERS: Nelwyn Salisbury, MD  Primary Cardiologist:  Meriam Sprague, MD  LABS: Labs reviewed: Acceptable for surgery. (all labs ordered are listed, but only abnormal results are displayed)  Labs Reviewed  SURGICAL PCR SCREEN  CBC WITH DIFFERENTIAL/PLATELET  COMPREHENSIVE METABOLIC PANEL  TYPE AND SCREEN     IMAGES:   EKG:   CV:  Past Medical History:  Diagnosis Date   Anxiety disorder    hx vasovagal responses   Arthritis    Cancer (HCC)    Deep vein thrombosis (DVT) (HCC)    hx  of in left left after covid in 2021   Headache(784.0)    History of kidney stones    Hydronephrosis, right    Hyperlipidemia    Hypertension    Nephrolithiasis    Pneumonia    Positive nasal culture for methicillin resistant Staphylococcus aureus    Right ureteral stone    Thrombophlebitis of leg, left, superficial 11/17/2020   Urinary incontinence    sees Dr. McDiarmid    Vasovagal syncope 08/02/2022    Past Surgical History:  Procedure Laterality Date   BLADDER REPAIR     COLONOSCOPY  12/28/2015   per Dr. Adela Lank, serrated polyps and diverticula, repeat in 3 yrs    CYSTOSCOPY  06/09/2011   Procedure: CYSTOSCOPY;  Surgeon: Martina Sinner, MD;  Location: [facility];  Service: Urology;  Laterality: N/A;   CYSTOSCOPY W/ URETERAL STENT PLACEMENT  02/03/2014   Procedure: CYSTOSCOPY WITH  RIGHT RETROGRADE PYELOGRAM/ RIGHT URETERAL STENT PLACEMENT;  Surgeon: Anner Crete, MD;  [facility];  Service: Urology;;   CYSTOSCOPY WITH BIOPSY  02/03/2014   Procedure: CYSTOSCOPY WITH BIOPSY;  Surgeon: Anner Crete, MD;  Location: [facility];  Service: Urology;;   PILONIDAL CYST EXCISION  1974   RECTOCELE REPAIR  06/09/2011   Procedure: POSTERIOR REPAIR (RECTOCELE);  Surgeon: Martina Sinner, MD;  Location: [facility];  Service: Urology;  Laterality: N/A;  Xenform graft 6x10   RIGHT URETEROSCOPIC STONE  EXTRACTION / STENT PLACEMENT  03/13/2000   URETEROSCOPY Right 02/03/2014   Procedure: URETEROSCOPY WITH MANAGEMENT OF URETERAL STRICTURE;  Surgeon: Anner Crete, MD;  Location[facility];  Service: Urology;  Laterality: Right;   VAGINAL HYSTERECTOMY  06/09/2011   Procedure: HYSTERECTOMY VAGINAL;  Surgeon: Mickel Baas, MD;  Location: [facility];  Service: Gynecology;  Laterality: N/A;   VAGINAL PROLAPSE REPAIR  06/09/2011   Procedure: VAGINAL VAULT SUSPENSION;  Surgeon: Martina Sinner, MD;  Location: [facility];  Service: Urology;   Laterality: N/A;   VEIN LIGATION AND STRIPPING     LEFT LEG    MEDICATIONS:  aspirin 81 MG chewable tablet   estradiol (ESTRACE) 0.1 MG/GM vaginal cream   hydrochlorothiazide (HYDRODIURIL) 25 MG tablet   oxybutynin (DITROPAN-XL) 5 MG 24 hr tablet   valsartan (DIOVAN) 40 MG tablet   No current facility-administered medications for this encounter.    Jodell Cipro Ward, PA-C [facility] Pre-Surgical Testing [phone]

## 2022-09-14 NOTE — Telephone Encounter (Signed)
FYI  Spoke with pt advised of ENT referral. Stated that the problem has resolved and that she will hold off on the referral

## 2022-09-15 NOTE — Care Plan (Signed)
Ortho Bundle Case Management Note  Patient Details  Name: Megan Crane MRN: [id number] Date of Birth: [birth year]  met with patient in the office for H&P. she will discharge to home wth family to assist. rolling walker and 3n1 ordered for home use. OPPT set up with [facility]. discharge instructions discussed and questions answered.   DME Arranged:  Walker rolling, 3-N-1 DME Agency:  [employer]  HH Arranged:    HH Agency:     Additional Comments: Please contact me with any questions of if this plan should need to change.  Shauna Hugh,  RN,BSN,MHA,CCM  [facility]ist  [phone] 09/15/2022, 11:04 AM

## 2022-09-18 ENCOUNTER — Ambulatory Visit (HOSPITAL_COMMUNITY): Payer: Medicare Other

## 2022-09-18 ENCOUNTER — Other Ambulatory Visit: Payer: Self-pay

## 2022-09-18 ENCOUNTER — Encounter (HOSPITAL_COMMUNITY): Payer: Self-pay | Admitting: Orthopedic Surgery

## 2022-09-18 ENCOUNTER — Ambulatory Visit (HOSPITAL_COMMUNITY): Payer: Medicare Other | Admitting: Physician Assistant

## 2022-09-18 ENCOUNTER — Observation Stay (HOSPITAL_COMMUNITY)
Admission: RE | Admit: 2022-09-18 | Discharge: 2022-09-19 | Disposition: A | Discharge status: Home / Self Care | Payer: Medicare Other | Attending: Orthopedic Surgery | Admitting: Orthopedic Surgery

## 2022-09-18 ENCOUNTER — Ambulatory Visit (HOSPITAL_BASED_OUTPATIENT_CLINIC_OR_DEPARTMENT_OTHER): Payer: Medicare Other

## 2022-09-18 ENCOUNTER — Encounter (HOSPITAL_COMMUNITY)
Admission: RE | Disposition: A | Discharge status: Home / Self Care | Payer: Self-pay | Source: Home / Self Care | Attending: Orthopedic Surgery

## 2022-09-18 DIAGNOSIS — I10 Essential (primary) hypertension: Secondary | ICD-10-CM | POA: Diagnosis not present

## 2022-09-18 DIAGNOSIS — Z8616 Personal history of COVID-19: Secondary | ICD-10-CM | POA: Insufficient documentation

## 2022-09-18 DIAGNOSIS — Z01818 Encounter for other preprocedural examination: Secondary | ICD-10-CM

## 2022-09-18 DIAGNOSIS — M1711 Unilateral primary osteoarthritis, right knee: Secondary | ICD-10-CM

## 2022-09-18 DIAGNOSIS — Z86718 Personal history of other venous thrombosis and embolism: Secondary | ICD-10-CM | POA: Diagnosis not present

## 2022-09-18 DIAGNOSIS — R609 Edema, unspecified: Secondary | ICD-10-CM | POA: Diagnosis not present

## 2022-09-18 DIAGNOSIS — Z7982 Long term (current) use of aspirin: Secondary | ICD-10-CM | POA: Insufficient documentation

## 2022-09-18 DIAGNOSIS — Z79899 Other long term (current) drug therapy: Secondary | ICD-10-CM | POA: Diagnosis not present

## 2022-09-18 DIAGNOSIS — Z87891 Personal history of nicotine dependence: Secondary | ICD-10-CM | POA: Diagnosis not present

## 2022-09-18 DIAGNOSIS — Z9889 Other specified postprocedural states: Secondary | ICD-10-CM | POA: Diagnosis not present

## 2022-09-18 DIAGNOSIS — Z96651 Presence of right artificial knee joint: Principal | ICD-10-CM

## 2022-09-18 DIAGNOSIS — G8918 Other acute postprocedural pain: Secondary | ICD-10-CM | POA: Diagnosis not present

## 2022-09-18 HISTORY — PX: PARTIAL KNEE ARTHROPLASTY: SHX2174

## 2022-09-18 SURGERY — ARTHROPLASTY, KNEE, UNICOMPARTMENTAL
Anesthesia: Monitor Anesthesia Care | Site: Knee | Laterality: Right

## 2022-09-18 MED ORDER — HYDROMORPHONE HCL 1 MG/ML IJ SOLN: 0.2500 mg | INTRAMUSCULAR | Status: DC | PRN

## 2022-09-18 MED ORDER — PROPOFOL 500 MG/50ML IV EMUL
INTRAVENOUS | Status: DC | PRN
  Administered 2022-09-18: 20 mg via INTRAVENOUS
  Administered 2022-09-18: 85 ug/kg/min via INTRAVENOUS

## 2022-09-18 MED ORDER — METHOCARBAMOL 500 MG IVPB - SIMPLE MED
500.0000 mg | Freq: Four times a day (QID) | INTRAVENOUS | Status: DC | PRN
  Administered 2022-09-18: 500 mg via INTRAVENOUS

## 2022-09-18 MED ORDER — AMISULPRIDE (ANTIEMETIC) 5 MG/2ML IV SOLN: 10.0000 mg | Freq: Once | INTRAVENOUS | Status: DC | PRN

## 2022-09-18 MED ORDER — DEXAMETHASONE SODIUM PHOSPHATE 10 MG/ML IJ SOLN
INTRAMUSCULAR | Status: DC | PRN
Start: 2022-09-18 — End: 2022-09-18
  Administered 2022-09-18: 8 mg
  Administered 2022-09-18: 10 mg

## 2022-09-18 MED ORDER — TRANEXAMIC ACID-NACL 1000-0.7 MG/100ML-% IV SOLN
1000.0000 mg | INTRAVENOUS | Status: AC
  Administered 2022-09-18: 1000 mg via INTRAVENOUS
  Filled 2022-09-18: qty 100

## 2022-09-18 MED ORDER — OXYCODONE HCL 5 MG PO TABS
ORAL_TABLET | ORAL | Status: AC
  Filled 2022-09-18: qty 1

## 2022-09-18 MED ORDER — ORAL CARE MOUTH RINSE: 15.0000 mL | Freq: Once | OROMUCOSAL | Status: AC

## 2022-09-18 MED ORDER — CELECOXIB 100 MG PO CAPS: 100.0000 mg | ORAL_CAPSULE | Freq: Two times a day (BID) | ORAL | 0 refills | Status: AC

## 2022-09-18 MED ORDER — PHENOL 1.4 % MT LIQD: 1.0000 | OROMUCOSAL | Status: DC | PRN

## 2022-09-18 MED ORDER — CEFAZOLIN SODIUM-DEXTROSE 2-4 GM/100ML-% IV SOLN
2.0000 g | Freq: Four times a day (QID) | INTRAVENOUS | Status: AC
  Administered 2022-09-18: 2 g via INTRAVENOUS

## 2022-09-18 MED ORDER — PHENYLEPHRINE HCL-NACL 20-0.9 MG/250ML-% IV SOLN
INTRAVENOUS | Status: AC
  Filled 2022-09-18: qty 500

## 2022-09-18 MED ORDER — LACTATED RINGERS IV BOLUS
500.0000 mL | Freq: Once | INTRAVENOUS | Status: AC
  Administered 2022-09-18: 500 mL via INTRAVENOUS

## 2022-09-18 MED ORDER — BUPIVACAINE LIPOSOME 1.3 % IJ SUSP: 20.0000 mL | Freq: Once | INTRAMUSCULAR | Status: DC

## 2022-09-18 MED ORDER — ONDANSETRON HCL 4 MG/2ML IJ SOLN: 4.0000 mg | Freq: Four times a day (QID) | INTRAMUSCULAR | Status: DC | PRN

## 2022-09-18 MED ORDER — ACETAMINOPHEN 500 MG PO TABS
1000.0000 mg | ORAL_TABLET | Freq: Four times a day (QID) | ORAL | Status: AC
  Administered 2022-09-18 – 2022-09-19 (×3): 1000 mg via ORAL
  Filled 2022-09-18 (×2): qty 2

## 2022-09-18 MED ORDER — MENTHOL 3 MG MT LOZG: 1.0000 | LOZENGE | OROMUCOSAL | Status: DC | PRN

## 2022-09-18 MED ORDER — ACETAMINOPHEN 500 MG PO TABS: 1000.0000 mg | ORAL_TABLET | Freq: Three times a day (TID) | ORAL | Status: AC | PRN

## 2022-09-18 MED ORDER — DEXAMETHASONE SODIUM PHOSPHATE 10 MG/ML IJ SOLN
INTRAMUSCULAR | Status: AC
  Filled 2022-09-18: qty 1

## 2022-09-18 MED ORDER — DIPHENHYDRAMINE HCL 12.5 MG/5ML PO ELIX: 12.5000 mg | ORAL_SOLUTION | ORAL | Status: DC | PRN

## 2022-09-18 MED ORDER — DEXAMETHASONE SODIUM PHOSPHATE 10 MG/ML IJ SOLN: 8.0000 mg | Freq: Once | INTRAMUSCULAR | Status: DC

## 2022-09-18 MED ORDER — ONDANSETRON HCL 4 MG/2ML IJ SOLN
INTRAMUSCULAR | Status: DC | PRN
  Administered 2022-09-18: 4 mg via INTRAVENOUS

## 2022-09-18 MED ORDER — POLYETHYLENE GLYCOL 3350 17 G PO PACK: 17.0000 g | PACK | Freq: Every day | ORAL | Status: DC | PRN

## 2022-09-18 MED ORDER — STERILE WATER FOR IRRIGATION IR SOLN
Status: DC | PRN
Start: 2022-09-18 — End: 2022-09-18
  Administered 2022-09-18: 2000 mL

## 2022-09-18 MED ORDER — PROPOFOL 10 MG/ML IV BOLUS
INTRAVENOUS | Status: AC
  Filled 2022-09-18: qty 20

## 2022-09-18 MED ORDER — ACETAMINOPHEN 500 MG PO TABS: 1000.0000 mg | ORAL_TABLET | Freq: Once | ORAL | Status: DC

## 2022-09-18 MED ORDER — MIDAZOLAM HCL 5 MG/5ML IJ SOLN
INTRAMUSCULAR | Status: DC | PRN
  Administered 2022-09-18 (×2): 1 mg via INTRAVENOUS

## 2022-09-18 MED ORDER — ACETAMINOPHEN 500 MG PO TABS
ORAL_TABLET | ORAL | Status: AC
  Filled 2022-09-18: qty 2

## 2022-09-18 MED ORDER — PANTOPRAZOLE SODIUM 40 MG PO TBEC
40.0000 mg | DELAYED_RELEASE_TABLET | Freq: Every day | ORAL | Status: DC
  Administered 2022-09-18 – 2022-09-19 (×2): 40 mg via ORAL
  Filled 2022-09-18 (×2): qty 1

## 2022-09-18 MED ORDER — ACETAMINOPHEN 325 MG PO TABS: 325.0000 mg | ORAL_TABLET | Freq: Four times a day (QID) | ORAL | Status: DC | PRN

## 2022-09-18 MED ORDER — FENTANYL CITRATE (PF) 100 MCG/2ML IJ SOLN
INTRAMUSCULAR | Status: AC
  Filled 2022-09-18: qty 2

## 2022-09-18 MED ORDER — OXYCODONE HCL 5 MG PO TABS
5.0000 mg | ORAL_TABLET | ORAL | Status: DC | PRN
  Administered 2022-09-18: 5 mg via ORAL
  Administered 2022-09-19: 10 mg via ORAL
  Filled 2022-09-18: qty 2

## 2022-09-18 MED ORDER — 0.9 % SODIUM CHLORIDE (POUR BTL) OPTIME
TOPICAL | Status: DC | PRN
  Administered 2022-09-18: 1000 mL

## 2022-09-18 MED ORDER — APIXABAN 2.5 MG PO TABS: 2.5000 mg | ORAL_TABLET | Freq: Two times a day (BID) | ORAL | 0 refills | Status: DC

## 2022-09-18 MED ORDER — ONDANSETRON HCL 4 MG PO TABS: 4.0000 mg | ORAL_TABLET | Freq: Three times a day (TID) | ORAL | 0 refills | Status: AC | PRN

## 2022-09-18 MED ORDER — METHOCARBAMOL 500 MG IVPB - SIMPLE MED
INTRAVENOUS | Status: AC
  Filled 2022-09-18: qty 55

## 2022-09-18 MED ORDER — HYDROMORPHONE HCL 1 MG/ML IJ SOLN: 0.5000 mg | INTRAMUSCULAR | Status: DC | PRN

## 2022-09-18 MED ORDER — ONDANSETRON HCL 4 MG PO TABS: 4.0000 mg | ORAL_TABLET | Freq: Four times a day (QID) | ORAL | Status: DC | PRN

## 2022-09-18 MED ORDER — LACTATED RINGERS IV BOLUS
250.0000 mL | Freq: Once | INTRAVENOUS | Status: AC
  Administered 2022-09-18: 250 mL via INTRAVENOUS

## 2022-09-18 MED ORDER — ACETAMINOPHEN 500 MG PO TABS
1000.0000 mg | ORAL_TABLET | Freq: Once | ORAL | Status: AC
  Administered 2022-09-18: 1000 mg via ORAL
  Filled 2022-09-18: qty 2

## 2022-09-18 MED ORDER — LACTATED RINGERS IV SOLN: INTRAVENOUS | Status: DC

## 2022-09-18 MED ORDER — OXYCODONE HCL 5 MG PO TABS
5.0000 mg | ORAL_TABLET | ORAL | 0 refills | Status: AC | PRN
Start: 2022-09-18 — End: 2022-09-25

## 2022-09-18 MED ORDER — ONDANSETRON HCL 4 MG/2ML IJ SOLN
INTRAMUSCULAR | Status: AC
  Filled 2022-09-18: qty 2

## 2022-09-18 MED ORDER — MIDAZOLAM HCL 2 MG/2ML IJ SOLN
INTRAMUSCULAR | Status: AC
  Filled 2022-09-18: qty 2

## 2022-09-18 MED ORDER — FENTANYL CITRATE (PF) 100 MCG/2ML IJ SOLN
INTRAMUSCULAR | Status: DC | PRN
  Administered 2022-09-18: 25 ug via INTRAVENOUS

## 2022-09-18 MED ORDER — BUPIVACAINE IN DEXTROSE 0.75-8.25 % IT SOLN
INTRATHECAL | Status: DC | PRN
Start: 2022-09-18 — End: 2022-09-18
  Administered 2022-09-18: 1.8 mL via INTRATHECAL

## 2022-09-18 MED ORDER — BUPIVACAINE-EPINEPHRINE (PF) 0.25% -1:200000 IJ SOLN
INTRAMUSCULAR | Status: DC | PRN
  Administered 2022-09-18: 30 mL via PERINEURAL

## 2022-09-18 MED ORDER — METHOCARBAMOL 500 MG PO TABS: 500.0000 mg | ORAL_TABLET | Freq: Three times a day (TID) | ORAL | 0 refills | Status: AC | PRN

## 2022-09-18 MED ORDER — OMEPRAZOLE 40 MG PO CPDR: 40.0000 mg | DELAYED_RELEASE_CAPSULE | Freq: Every day | ORAL | 0 refills | Status: AC

## 2022-09-18 MED ORDER — KETOROLAC TROMETHAMINE 15 MG/ML IJ SOLN
7.5000 mg | Freq: Four times a day (QID) | INTRAMUSCULAR | Status: AC
  Administered 2022-09-18 – 2022-09-19 (×3): 7.5 mg via INTRAVENOUS
  Filled 2022-09-18 (×2): qty 1

## 2022-09-18 MED ORDER — CEFAZOLIN SODIUM-DEXTROSE 2-4 GM/100ML-% IV SOLN
INTRAVENOUS | Status: AC
  Filled 2022-09-18: qty 100

## 2022-09-18 MED ORDER — DOCUSATE SODIUM 100 MG PO CAPS
100.0000 mg | ORAL_CAPSULE | Freq: Two times a day (BID) | ORAL | Status: DC
  Administered 2022-09-18 – 2022-09-19 (×2): 100 mg via ORAL
  Filled 2022-09-18 (×2): qty 1

## 2022-09-18 MED ORDER — ROPIVACAINE HCL 5 MG/ML IJ SOLN
INTRAMUSCULAR | Status: DC | PRN
Start: 2022-09-18 — End: 2022-09-18
  Administered 2022-09-18: 30 mL via EPIDURAL

## 2022-09-18 MED ORDER — BUPIVACAINE LIPOSOME 1.3 % IJ SUSP
INTRAMUSCULAR | Status: DC | PRN
  Administered 2022-09-18: 20 mL

## 2022-09-18 MED ORDER — APIXABAN 2.5 MG PO TABS
2.5000 mg | ORAL_TABLET | Freq: Two times a day (BID) | ORAL | Status: DC
  Administered 2022-09-19: 2.5 mg via ORAL
  Filled 2022-09-18: qty 1

## 2022-09-18 MED ORDER — SODIUM CHLORIDE 0.9 % IV SOLN: INTRAVENOUS | Status: DC

## 2022-09-18 MED ORDER — SODIUM CHLORIDE 0.9 % IR SOLN
Status: DC | PRN
  Administered 2022-09-18: 3000 mL

## 2022-09-18 MED ORDER — MEPERIDINE HCL 50 MG/ML IJ SOLN: 6.2500 mg | INTRAMUSCULAR | Status: DC | PRN

## 2022-09-18 MED ORDER — CEFAZOLIN SODIUM-DEXTROSE 2-4 GM/100ML-% IV SOLN
2.0000 g | INTRAVENOUS | Status: AC
  Administered 2022-09-18: 2 g via INTRAVENOUS
  Filled 2022-09-18: qty 100

## 2022-09-18 MED ORDER — HYDROCODONE-ACETAMINOPHEN 7.5-325 MG PO TABS: 1.0000 | ORAL_TABLET | Freq: Once | ORAL | Status: DC | PRN

## 2022-09-18 MED ORDER — BUPIVACAINE-EPINEPHRINE 0.25% -1:200000 IJ SOLN
INTRAMUSCULAR | Status: AC
  Filled 2022-09-18: qty 1

## 2022-09-18 MED ORDER — PHENYLEPHRINE HCL-NACL 20-0.9 MG/250ML-% IV SOLN
INTRAVENOUS | Status: DC | PRN
Start: 2022-09-18 — End: 2022-09-18
  Administered 2022-09-18: 25 ug/min via INTRAVENOUS

## 2022-09-18 MED ORDER — BUPIVACAINE LIPOSOME 1.3 % IJ SUSP
INTRAMUSCULAR | Status: AC
  Filled 2022-09-18: qty 20

## 2022-09-18 MED ORDER — ONDANSETRON HCL 4 MG/2ML IJ SOLN: 4.0000 mg | Freq: Once | INTRAMUSCULAR | Status: DC | PRN

## 2022-09-18 MED ORDER — METHOCARBAMOL 500 MG PO TABS: 500.0000 mg | ORAL_TABLET | Freq: Four times a day (QID) | ORAL | Status: DC | PRN

## 2022-09-18 MED ORDER — PROPOFOL 1000 MG/100ML IV EMUL
INTRAVENOUS | Status: AC
  Filled 2022-09-18: qty 100

## 2022-09-18 MED ORDER — CHLORHEXIDINE GLUCONATE 0.12 % MT SOLN
15.0000 mL | Freq: Once | OROMUCOSAL | Status: AC
  Administered 2022-09-18: 15 mL via OROMUCOSAL

## 2022-09-18 MED ORDER — POVIDONE-IODINE 10 % EX SWAB: 2.0000 | Freq: Once | CUTANEOUS | Status: DC

## 2022-09-18 MED ORDER — POLYETHYLENE GLYCOL 3350 17 G PO PACK: 17.0000 g | PACK | Freq: Every day | ORAL | 0 refills | Status: AC

## 2022-09-18 MED ORDER — KETOROLAC TROMETHAMINE 15 MG/ML IJ SOLN
INTRAMUSCULAR | Status: AC
  Filled 2022-09-18: qty 1

## 2022-09-18 SURGICAL SUPPLY — 67 items
ADH SKN CLS APL DERMABOND .7 (GAUZE/BANDAGES/DRESSINGS) ×1
APL PRP STRL LF DISP 70% ISPRP (MISCELLANEOUS) ×1
BAG COUNTER SPONGE SURGICOUNT (BAG) ×1 IMPLANT
BAG SPNG CNTER NS LX DISP (BAG) ×1
BLADE SAW RECIPROCATING 77.5 (BLADE) ×1 IMPLANT
BLADE SAW SGTL 13.0X1.19X90.0M (BLADE) ×1 IMPLANT
BNDG CMPR 5X3 CHSV STRCH STRL (GAUZE/BANDAGES/DRESSINGS) ×1
BNDG CMPR MED 10X6 ELC LF (GAUZE/BANDAGES/DRESSINGS) ×1
BNDG COHESIVE 3X5 TAN ST LF (GAUZE/BANDAGES/DRESSINGS) ×1 IMPLANT
BNDG ELASTIC 6X10 VLCR STRL LF (GAUZE/BANDAGES/DRESSINGS) ×1 IMPLANT
BOWL SMART MIX CTS (DISPOSABLE) ×1 IMPLANT
BRNG TIB D 8 STRL KN RT MDL (Orthopedic Implant) ×1 IMPLANT
BSPLAT TIB D STRL KN RT MED TI (Orthopedic Implant) ×1 IMPLANT
CEMENT BONE R 1X40 (Cement) IMPLANT
CEMENT BONE REFOBACIN R1X40 US (Cement) IMPLANT
CHLORAPREP W/TINT 26 (MISCELLANEOUS) ×1 IMPLANT
COMP FEM CMT PS KNEE RM (Joint) ×1 IMPLANT
COMPONENT FEM CMT PS KNEE RM (Joint) IMPLANT
COVER SURGICAL LIGHT HANDLE (MISCELLANEOUS) ×1 IMPLANT
CUFF TOURN SGL QUICK 34 (TOURNIQUET CUFF) ×1
CUFF TRNQT CYL 34X4.125X (TOURNIQUET CUFF) ×1 IMPLANT
DERMABOND ADVANCED .7 DNX12 (GAUZE/BANDAGES/DRESSINGS) ×1 IMPLANT
DRAPE INCISE IOBAN 85X60 (DRAPES) ×1 IMPLANT
DRAPE SHEET LG 3/4 BI-LAMINATE (DRAPES) ×1 IMPLANT
DRAPE U-SHAPE 47X51 STRL (DRAPES) ×1 IMPLANT
DRESSING AQUACEL AG SP 3.5X10 (GAUZE/BANDAGES/DRESSINGS) IMPLANT
DRSG AQUACEL AG ADV 3.5X 6 (GAUZE/BANDAGES/DRESSINGS) IMPLANT
DRSG AQUACEL AG SP 3.5X10 (GAUZE/BANDAGES/DRESSINGS)
ELECT REM PT RETURN 15FT ADLT (MISCELLANEOUS) ×1 IMPLANT
GAUZE SPONGE 4X4 12PLY STRL (GAUZE/BANDAGES/DRESSINGS) ×1 IMPLANT
GLOVE BIO SURGEON STRL SZ 6.5 (GLOVE) ×1 IMPLANT
GLOVE BIOGEL PI IND STRL 6.5 (GLOVE) ×2 IMPLANT
GLOVE BIOGEL PI IND STRL 8 (GLOVE) ×1 IMPLANT
GLOVE SURG ORTHO 8.0 STRL STRW (GLOVE) ×2 IMPLANT
GOWN STRL REUS W/ TWL XL LVL3 (GOWN DISPOSABLE) ×2 IMPLANT
GOWN STRL REUS W/TWL XL LVL3 (GOWN DISPOSABLE) ×2
HANDPIECE INTERPULSE COAX TIP (DISPOSABLE) ×1
HOOD PEEL AWAY T7 (MISCELLANEOUS) ×3 IMPLANT
INSERTER TIP PARTIAL KNEE (MISCELLANEOUS) IMPLANT
KIT TURNOVER KIT A (KITS) IMPLANT
MANIFOLD NEPTUNE II (INSTRUMENTS) ×1 IMPLANT
MARKER SKIN DUAL TIP RULER LAB (MISCELLANEOUS) ×1 IMPLANT
NS IRRIG 1000ML POUR BTL (IV SOLUTION) ×1 IMPLANT
PACK TOTAL KNEE CUSTOM (KITS) ×1 IMPLANT
PART TIBIAL CEM SZ D RT KNEE (Orthopedic Implant) ×1 IMPLANT
PARTVIVACIT-E SZ D 8 KNEE (Orthopedic Implant) ×1 IMPLANT
PIN DRILL HDLS TROCAR 75 4PK (PIN) IMPLANT
PROSTHESIS KNEE VIVACTV E SZD8 (Orthopedic Implant) IMPLANT
SCREW HEADED 33MM KNEE (MISCELLANEOUS) IMPLANT
SCREW HEADED 48MM KNEE (MISCELLANEOUS) IMPLANT
SET HNDPC FAN SPRY TIP SCT (DISPOSABLE) ×1 IMPLANT
SOLUTION IRRIG SURGIPHOR (IV SOLUTION) IMPLANT
STRIP CLOSURE SKIN 1/2X4 (GAUZE/BANDAGES/DRESSINGS) ×1 IMPLANT
SUT MNCRL AB 3-0 PS2 18 (SUTURE) ×1 IMPLANT
SUT MNCRL AB 3-0 PS2 27 (SUTURE) ×1 IMPLANT
SUT STRATAFIX 0 PDS 27 VIOLET (SUTURE) ×1
SUT STRATAFIX PDO 1 14 VIOLET (SUTURE) ×1
SUT STRATFX PDO 1 14 VIOLET (SUTURE) ×1
SUT VIC AB 2-0 CT2 27 (SUTURE) ×2 IMPLANT
SUTURE STRATFX 0 PDS 27 VIOLET (SUTURE) ×1 IMPLANT
SUTURE STRATFX PDO 1 14 VIOLET (SUTURE) ×1 IMPLANT
SYSTEM KNEE TIBIAL CEM SZ D RT (Orthopedic Implant) IMPLANT
TAPE STRIPS DRAPE STRL (GAUZE/BANDAGES/DRESSINGS) IMPLANT
TRAY FOLEY MTR SLVR 16FR STAT (SET/KITS/TRAYS/PACK) IMPLANT
TUBE SUCTION HIGH CAP CLEAR NV (SUCTIONS) ×1 IMPLANT
UNDERPAD 30X36 HEAVY ABSORB (UNDERPADS AND DIAPERS) ×1 IMPLANT
WRAP KNEE MAXI GEL POST OP (GAUZE/BANDAGES/DRESSINGS) ×1 IMPLANT

## 2022-09-18 NOTE — Anesthesia Preprocedure Evaluation (Addendum)
Anesthesia Evaluation  Patient identified by MRN, date of birth, ID band Patient awake    Reviewed: Allergy & Precautions, H&P , NPO status , Patient's Chart, lab work & pertinent test results  Airway Mallampati: III  TM Distance: >3 FB Neck ROM: Full    Dental  (+) Teeth Intact, Dental Advisory Given   Pulmonary former smoker   Pulmonary exam normal breath sounds clear to auscultation       Cardiovascular hypertension, Pt. on medications + DVT (provoked, 2021)  Normal cardiovascular exam Rhythm:Regular Rate:Normal  Stress test 2017 normal   Neuro/Psych  Headaches PSYCHIATRIC DISORDERS Anxiety        GI/Hepatic Neg liver ROS,GERD  Controlled,,  Endo/Other  negative endocrine ROS    Renal/GU negative Renal ROS  negative genitourinary   Musculoskeletal  (+) Arthritis , Osteoarthritis,    Abdominal  (+) + obese  Peds negative pediatric ROS (+)  Hematology negative hematology ROS (+) Hb 13.3, plt 204   Anesthesia Other Findings   Reproductive/Obstetrics negative OB ROS                             Anesthesia Physical Anesthesia Plan  ASA: 2  Anesthesia Plan: MAC, Regional and Spinal   Post-op Pain Management: Regional block* and Tylenol PO (pre-op)*   Induction: Intravenous  PONV Risk Score and Plan: 2 and Propofol infusion, TIVA, Treatment may vary due to age or medical condition and Midazolam  Airway Management Planned: Natural Airway and Simple Face Mask  Additional Equipment: None  Intra-op Plan:   Post-operative Plan:   Informed Consent: I have reviewed the patients History and Physical, chart, labs and discussed the procedure including the risks, benefits and alternatives for the proposed anesthesia with the patient or authorized representative who has indicated his/her understanding and acceptance.     Dental advisory given  Plan Discussed with: CRNA  Anesthesia Plan  Comments: ("Allergy to lidocaine"- pt actually doesn't remember the circumstance, thinks it was a dental procedure during which her "eyes crossed," but has had dental work w/ numbing medication since then. Will proceed w/ normal anesthetic)       Anesthesia Quick Evaluation

## 2022-09-18 NOTE — Transfer of Care (Signed)
Immediate Anesthesia Transfer of Care Note  Patient: Megan Crane  Procedure(s) Performed: UNICOMPARTMENTAL KNEE (Right: Knee)  Patient Location: PACU  Anesthesia Type:Spinal  Level of Consciousness: awake  Airway & Oxygen Therapy: Patient Spontanous Breathing and Patient connected to face mask oxygen  Post-op Assessment: Report given to RN and Post -op Vital signs reviewed and stable  Post vital signs: Reviewed and stable  Last Vitals:  Vitals Value Taken Time  BP 114/70 09/18/22 0950  Temp    Pulse 72 09/18/22 0953  Resp 15 09/18/22 0953  SpO2 98 % 09/18/22 0953  Vitals shown include unfiled device data.  Last Pain:  Vitals:   09/18/22 0639  TempSrc: Oral  PainSc:          Complications: No notable events documented.

## 2022-09-18 NOTE — Anesthesia Procedure Notes (Signed)
Anesthesia Regional Block: Adductor canal block   Pre-Anesthetic Checklist: , timeout performed,  Correct Patient, Correct Site, Correct Laterality,  Correct Procedure, Correct Position, site marked,  Risks and benefits discussed,  Surgical consent,  Pre-op evaluation,  At surgeon's request and post-op pain management  Laterality: Right  Prep: Maximum Sterile Barrier Precautions used, chloraprep       Needles:  Injection technique: Single-shot  Needle Type: Echogenic Stimulator Needle     Needle Length: 9cm  Needle Gauge: 22     Additional Needles:   Procedures:,,,, ultrasound used (permanent image in chart),,    Narrative:  Start time: 09/18/2022 7:00 AM End time: 09/18/2022 7:05 AM Injection made incrementally with aspirations every 5 mL.  Performed by: Personally  Anesthesiologist: Lannie Fields, DO  Additional Notes: Monitors applied. No increased pain on injection. No increased resistance to injection. Injection made in 5cc increments. Good needle visualization. Patient tolerated procedure well.

## 2022-09-18 NOTE — Discharge Instructions (Signed)
Information on my medicine - ELIQUIS (apixaban)  This medication education was reviewed with me or my healthcare representative as part of my discharge preparation.  T Why was Eliquis prescribed for you? Eliquis was prescribed for you to reduce the risk of blood clots forming after orthopedic surgery.    What do You need to know about Eliquis? Take your Eliquis TWICE DAILY - one tablet in the morning and one tablet in the evening with or without food.  It would be best to take the dose about the same time each day.  If you have difficulty swallowing the tablet whole please discuss with your pharmacist how to take the medication safely.  Take Eliquis exactly as prescribed by your doctor and DO NOT stop taking Eliquis without talking to the doctor who prescribed the medication.  Stopping without other medication to take the place of Eliquis may increase your risk of developing a clot.  After discharge, you should have regular check-up appointments with your healthcare provider that is prescribing your Eliquis.  What do you do if you miss a dose? If a dose of ELIQUIS is not taken at the scheduled time, take it as soon as possible on the same day and twice-daily administration should be resumed.  The dose should not be doubled to make up for a missed dose.  Do not take more than one tablet of ELIQUIS at the same time.  Important Safety Information A possible side effect of Eliquis is bleeding. You should call your healthcare provider right away if you experience any of the following: ? Bleeding from an injury or your nose that does not stop. ? Unusual colored urine (red or dark brown) or unusual colored stools (red or black). ? Unusual bruising for unknown reasons. ? A serious fall or if you hit your head (even if there is no bleeding).  Some medicines may interact with Eliquis and might increase your risk of bleeding or clotting while on Eliquis. To help avoid this, consult your  healthcare provider or pharmacist prior to using any new prescription or non-prescription medications, including herbals, vitamins, non-steroidal anti-inflammatory drugs (NSAIDs) and supplements.  This website has more information on Eliquis (apixaban): http://www.eliquis.com/eliquis/home

## 2022-09-18 NOTE — Anesthesia Procedure Notes (Addendum)
Spinal  Patient location during procedure: OR Start time: 09/18/2022 7:30 AM End time: 09/18/2022 7:38 AM Reason for block: surgical anesthesia Staffing Performed: anesthesiologist  Anesthesiologist: Lannie Fields, DO Performed by: Lannie Fields, DO Authorized by: Lannie Fields, DO   Preanesthetic Checklist Completed: patient identified, IV checked, risks and benefits discussed, surgical consent, monitors and equipment checked, pre-op evaluation and timeout performed Spinal Block Patient position: sitting Prep: DuraPrep and site prepped and draped Patient monitoring: cardiac monitor, continuous pulse ox and blood pressure Approach: midline Location: L3-4 Injection technique: single-shot Needle Needle type: Pencan  Needle gauge: 24 G Needle length: 9 cm Assessment Sensory level: T6 Events: CSF return Additional Notes Functioning IV was confirmed and monitors were applied. Sterile prep and drape, including hand hygiene and sterile gloves were used. The patient was positioned and the spine was prepped. The skin was anesthetized with lidocaine.  Free flow of clear CSF was obtained prior to injecting local anesthetic into the CSF.  The spinal needle aspirated freely following injection.  The needle was carefully withdrawn.  The patient tolerated the procedure well.

## 2022-09-18 NOTE — Evaluation (Addendum)
Physical Therapy Evaluation Patient Details Name: Megan Crane MRN: [id number] DOB: July 18, 1950 Today's Date: 09/18/2022  History of Present Illness  72 yo female presents to therapy s/p R medial unicompartmental knee arthoplasty on 09/18/2022 due to failure of conservative measures. Pt PMH includes but is not limited: syncope, B CTS, angina, GERD, raynaud's, HLD, and anxiety.  Clinical Impression     [name] is a 72 y.o. female POD 0 s/p R unicompartmental knee arthoplasty. Patient reports mod I with mobility at baseline. Patient is now limited by functional impairments (see PT problem list below). PT guided pt though LE TE in supine and noted minimal to no R quad innervation with poor motor control coordination and strength limiting pt ability to perform SLR and SAQ and progression to safely assess transfers and gait tasks. Pt is not ready for same day d/c at this time and PT will return to reassess later in the day. Patient will benefit from continued skilled PT interventions to address impairments and progress towards PLOF. Acute PT will follow to progress mobility and stair training in preparation for safe discharge home with family support and OPPT services starting 9/12.       If plan is discharge home, recommend the following: Two people to help with walking and/or transfers;A lot of help with bathing/dressing/bathroom;Assistance with cooking/housework;Assist for transportation;Help with stairs or ramp for entrance   Can travel by private vehicle        Equipment Recommendations Rolling walker (2 wheels)  Recommendations for Other Services       Functional Status Assessment Patient has had a recent decline in their functional status and demonstrates the ability to make significant improvements in function in a reasonable and predictable amount of time.     Precautions / Restrictions Precautions Precautions: Knee;Fall Restrictions Weight Bearing Restrictions: No       Mobility  Bed Mobility               General bed mobility comments: NT    Transfers                   General transfer comment: NT    Ambulation/Gait               General Gait Details: NT  Stairs            Wheelchair Mobility     Tilt Bed    Modified Rankin (Stroke Patients Only)       Balance                                             Pertinent Vitals/Pain Pain Assessment Pain Assessment: 0-10 Pain Score: 0-No pain Pain Location: R knee Pain Intervention(s): Limited activity within patient's tolerance, Monitored during session, Premedicated before session, Repositioned, Patient requesting pain meds-RN notified, Ice applied    Home Living Family/patient expects to be discharged to:: Private residence Living Arrangements: Spouse/significant other Available Help at Discharge: Family Type of Home: House Home Access: Stairs to enter Entrance Stairs-Rails: None Secretary/administrator of Steps: 2   Home Layout: One level Home Equipment: Cane - single point;BSC/3in1      Prior Function Prior Level of Function : Independent/Modified Independent;Driving             Mobility Comments: no AD IND with all ADLs self care tasks and IADLs  Extremity/Trunk Assessment        Lower Extremity Assessment Lower Extremity Assessment: RLE deficits/detail RLE Deficits / Details: ankle DF 3+/5. PF 3/5, PROM for SLR. minimal to no quad innervation noted pt unable to perform SLR or SAQ RLE Sensation: decreased proprioception    Cervical / Trunk Assessment Cervical / Trunk Assessment: Normal  Communication   Communication Communication: No apparent difficulties  Cognition Arousal: Alert Behavior During Therapy: WFL for tasks assessed/performed Overall Cognitive Status: Within Functional Limits for tasks assessed                                          General Comments      Exercises  Total Joint Exercises Ankle Circles/Pumps: AROM, Both, 20 reps Quad Sets: AAROM, Right, 5 reps Short Arc Quad: PROM, Right, 5 reps Heel Slides: AROM, Right, 5 reps Hip ABduction/ADduction: AROM, Right, 5 reps Straight Leg Raises: PROM, Right, 5 reps   Assessment/Plan    PT Assessment Patient needs continued PT services  PT Problem List Decreased strength;Decreased range of motion;Decreased activity tolerance;Decreased balance;Decreased mobility;Pain       PT Treatment Interventions DME instruction;Gait training;Stair training;Functional mobility training;Therapeutic activities;Therapeutic exercise;Balance training;Neuromuscular re-education;Patient/family education;Modalities    PT Goals (Current goals can be found in the Care Plan section)  Acute Rehab PT Goals Patient Stated Goal: to be able to go camping with the RV, visit and play with the grandchildren and walk PT Goal Formulation: With patient Time For Goal Achievement: 10/02/22 Potential to Achieve Goals: Good    Frequency 7X/week     Co-evaluation               AM-PAC PT "6 Clicks" Mobility  Outcome Measure Help needed turning from your back to your side while in a flat bed without using bedrails?: Total Help needed moving from lying on your back to sitting on the side of a flat bed without using bedrails?: Total Help needed moving to and from a bed to a chair (including a wheelchair)?: Total Help needed standing up from a chair using your arms (e.g., wheelchair or bedside chair)?: Total Help needed to walk in hospital room?: Total Help needed climbing 3-5 steps with a railing? : Total 6 Click Score: 6    End of Session   Activity Tolerance: Treatment limited secondary to medical complications (Comment) (slow regression of anesthesia) Patient left: in bed;with call bell/phone within reach;with family/visitor present Nurse Communication: Mobility status (pt not ready for same day d/c) PT Visit Diagnosis:  Unsteadiness on feet (R26.81);Other abnormalities of gait and mobility (R26.89);Muscle weakness (generalized) (M62.81);Difficulty in walking, not elsewhere classified (R26.2);Pain Pain - Right/Left: Right Pain - part of body: Knee;Leg    Time: 1191-4782 PT Time Calculation (min) (ACUTE ONLY): 31 min   Charges:   PT Evaluation $PT Eval Low Complexity: 1 Low PT Treatments $Therapeutic Exercise: 8-22 mins PT General Charges $$ ACUTE PT VISIT: 1 Visit         Johnny Bridge, PT Acute Rehab   Jacqualyn Posey 09/18/2022, 4:08 PM

## 2022-09-18 NOTE — Op Note (Signed)
09/18/2022  9:30 AM  PATIENT:  Megan Crane    PRE-OPERATIVE DIAGNOSIS:  Right knee end-stage medial compartment osteoarthritis  POST-OPERATIVE DIAGNOSIS:  Same  PROCEDURE:  Right medial unicompartmental Knee Arthroplasty  SURGEON:  Joen Laura, MD  PHYSICIAN ASSISTANT: Kathie Dike, PA-C, present and scrubbed throughout the case, critical for completion in a timely fashion, and for retraction, instrumentation, and closure.  ANESTHESIA:   spinal  ESTIMATED BLOOD LOSS: 50cc   PREOPERATIVE INDICATIONS:  Megan Crane is a  72 y.o. female with a diagnosis of OA RIGHT KNEE who failed conservative measures and elected for surgical management.    The risks benefits and alternatives were discussed with the patient preoperatively including but not limited to the risks of infection, bleeding, nerve injury, cardiopulmonary complications, blood clots, the need for revision surgery, among others, and the patient was willing to proceed.  OPERATIVE IMPLANTS: [employer] PPK fixed bearing medial compartment arthroplasty femur size 4, tibia size D, bearing size 8mm.  OPERATIVE FINDINGS: Endstage grade 4 anteromedial compartment osteoarthritis. No significant changes in the lateral or patellofemoral joint.  The ACL was intact.  OPERATIVE PROCEDURE:   Once adequate anesthesia, preoperative antibiotics, 2 gm of ancef,1 gm of Tranexamic Acid, and 8 mg of Decadron administered, the patient was positioned supine with a right thigh tourniquet placed.  The right lower extremity was prepped and draped in sterile fashion.  A time-  out was performed identifying the patient, planned procedure, and the appropriate extremity.   The leg was elevated and exsanguinated and the tourniquet was inflated. Anterior midline incision was performed. Medial Parapatellar incision was carried out, and the osteophytes were excised, along with the medial plateau and femoral condyle. Resected anterior horn of  medial meniscus and a small portion of the fat pad. Medial release was performed.  Remainder of the knee was evaluated.  ACL was intact lateral compartment with no significant wear.  Patellofemoral compartment with mild wear.  The extra medullary tibial cutting jig was applied, and resection was performed measuring 4 mm from the anterior medial defect.  Cut was made perpendicular to the axis of the tibia, matching the patient's native slope, and sagittal cut was made with appropriate rotation and just medial to the ACL insertion.    The proximal tibial bony cut was removed in one piece, and I turned my attention to the femur.  Rasp was used to clear debris from the corner of the cut.  We next turned our attention to the femur.  Distal femoral cutting block was put in place and pinned and the distal femur was cut.  The cutting guide was removed and the cut was completed with the knee in flexion.  We then sized the femur to be a 4 and then pinned the femur cutting guide into place taking care to appropriately lateralized and not overhang the component, medially or anteriorly.  The femur was drilled and the posterior and chamfer cuts were made.  With the knee in flexion medial meniscectomy was performed.  We next size of the tibia and found it to be a size D.  The tibial trial was then impacted into place in the right position pinned and the lugs were drilled.  The trial femoral component was then impacted, and a size 8mm liner trial was inserted.  The knee was relatively tight at first in both flexion extension with 2 mm Ambre stick difficult to insert.  Elected to recut the tibia and recut for 2 additional  millimeters.  Trial components were again reposition.  With the trial liner in place we used the Amber sticks and had approximately 2 mm of laxity in extension and 2 to 3 mm of laxity in flexion.  The femur also tracked appropriately on the center of the tibial component through flexion.   I  then cemented the components into place, cementing the tibia first, removing all excess cement, and then cementing the femur.  All loose cement was removed.  The trial liner was again inserted until cement had completely set.  The wounds were thoroughly irrigated with normal saline pulse lavage and injected with 20cc Exparel diluted with 30cc 0.25% marcaine with epi.  Once the cement was set again we assessed stability and balance which we felt was appropriate with a size 8mm mm liner.  The real polyethylene liner was opened and inserted.   The tourniquet was let down  No significant   hemostasis was required.  The medial parapatellar arthrotomy was then reapproximated using #1 Stratafix sutures with the knee  in flexion.  The remaining wound was closed with 0 stratafix, 2-0 Vicryl, and running 3-0 Monocryl.   The knee was cleaned, dried, dressed sterilely using Dermabond and Aquacel dressing.  The patient was then  brought to recovery room in stable condition, tolerating the procedure  well. There were no complications.   Post op recs: WB: WBAT Abx: ancef periop Imaging: PACU xrays DVT prophylaxis: Eliquis 2.5 mg twice daily x4 weeks given history of lower extremity blood clot. Follow up: 2 weeks after surgery for a wound check with Dr. Blanchie Dessert at [facility].  Address: [address], [place], [place] [address]  Office Phone: [phone]  Weber Cooks, MD Orthopaedic Surgery

## 2022-09-18 NOTE — Anesthesia Postprocedure Evaluation (Signed)
Anesthesia Post Note  Patient: Megan Crane  Procedure(s) Performed: UNICOMPARTMENTAL KNEE (Right: Knee)     Patient location during evaluation: PACU Anesthesia Type: Regional, MAC and Spinal Level of consciousness: awake and alert Pain management: pain level controlled Vital Signs Assessment: post-procedure vital signs reviewed and stable Respiratory status: spontaneous breathing, nonlabored ventilation, respiratory function stable and patient connected to nasal cannula oxygen Cardiovascular status: stable and blood pressure returned to baseline Postop Assessment: no apparent nausea or vomiting Anesthetic complications: no   No notable events documented.  Last Vitals:  Vitals:   09/18/22 1015 09/18/22 1030  BP: 117/69 117/76  Pulse: 73 69  Resp: 18 13  Temp:    SpO2: 100% 98%    Last Pain:  Vitals:   09/18/22 1030  TempSrc:   PainSc: 0-No pain                 Lannie Fields

## 2022-09-18 NOTE — Interval H&P Note (Signed)
The patient has been re-examined, and the chart reviewed, and there have been no interval changes to the documented history and physical.    Plan for R medial UKA vs TKA for R knee OA.  The operative side was examined and the patient was confirmed to have sensation to DPN, SPN, TN intact, Motor EHL, ext, flex 5/5, and DP 2+, PT 2+, No significant edema.   The risks, benefits, and alternatives have been discussed at length with patient, and the patient is willing to proceed.  Right knee marked. Consent has been signed.

## 2022-09-18 NOTE — Progress Notes (Signed)
Physical Therapy Treatment Patient Details Name: Megan Crane MRN: [id number] DOB: [birth year] Today's Date: 09/18/2022   History of Present Illness 72 yo female presents to therapy s/p R medial unicompartmental knee arthoplasty on 09/18/2022 due to failure of conservative measures. Pt PMH includes but is not limited: syncope, B CTS, angina, GERD, raynaud's, HLD, and anxiety.    PT Comments    Megan Crane is a 72 y.o. female POD 0 s/p R unicompartmental knee arthoplasty. Patient reports mod I with mobility at baseline. Patient is now limited by functional impairments (see PT problem list below). PT returned to assess pt safety and readiness for same day d/c ~2 hrs later. Pt continues to exhibit minimal quad innervation with poor motor control coordination and strength limiting pt ability to perform SLR and SAQ with improved ability to perform quad set and progression to safely assess transfers and gait tasks. Pt is not ready for same day d/c at this time and pt is agreeable to remaining in hospital due to slow regression of anesthesia. Pt is motivated to progress with therapy. Pt indicated hamstring and gastroc cramping and spasms and provided mm relaxer prior to PT return.  Patient will benefit from continued skilled PT interventions to address impairments and progress towards PLOF. Acute PT will follow to progress mobility and stair training in preparation for safe discharge home with family support and OPPT services starting 9/12.    If plan is discharge home, recommend the following: Two people to help with walking and/or transfers;A lot of help with bathing/dressing/bathroom;Assistance with cooking/housework;Assist for transportation;Help with stairs or ramp for entrance   Can travel by private vehicle        Equipment Recommendations  Rolling walker (2 wheels)    Recommendations for Other Services       Precautions / Restrictions Precautions Precautions:  Knee;Fall Restrictions Weight Bearing Restrictions: No     Mobility  Bed Mobility               General bed mobility comments: NT    Transfers                   General transfer comment: NT    Ambulation/Gait               General Gait Details: NT   Stairs             Wheelchair Mobility     Tilt Bed    Modified Rankin (Stroke Patients Only)       Balance                                            Cognition Arousal: Alert Behavior During Therapy: WFL for tasks assessed/performed Overall Cognitive Status: Within Functional Limits for tasks assessed                                          Exercises Total Joint Exercises Ankle Circles/Pumps: AROM, Both, 20 reps Quad Sets: Right, 5 reps, AROM Short Arc Quad: PROM, Right, 5 reps Heel Slides: AROM, Right, 5 reps Hip ABduction/ADduction: AROM, Right, 5 reps Straight Leg Raises: PROM, Right, 5 reps    General Comments        Pertinent Vitals/Pain Pain Assessment Pain Assessment: 0-10 Pain  Score: 0-No pain Pain Location: R knee Pain Intervention(s): Limited activity within patient's tolerance, Monitored during session, Ice applied    Home Living Family/patient expects to be discharged to:: Private residence Living Arrangements: Spouse/significant other Available Help at Discharge: Family Type of Home: House Home Access: Stairs to enter Entrance Stairs-Rails: None Secretary/administrator of Steps: 2   Home Layout: One level Home Equipment: Cane - single point;BSC/3in1      Prior Function            PT Goals (current goals can now be found in the care plan section) Acute Rehab PT Goals Patient Stated Goal: to be able to go camping with the RV, visit and play with the grandchildren and walk PT Goal Formulation: With patient Time For Goal Achievement: 10/02/22 Potential to Achieve Goals: Good Progress towards PT goals: Progressing  toward goals    Frequency    7X/week      PT Plan      Co-evaluation              AM-PAC PT "6 Clicks" Mobility   Outcome Measure  Help needed turning from your back to your side while in a flat bed without using bedrails?: Total Help needed moving from lying on your back to sitting on the side of a flat bed without using bedrails?: Total Help needed moving to and from a bed to a chair (including a wheelchair)?: Total Help needed standing up from a chair using your arms (e.g., wheelchair or bedside chair)?: Total Help needed to walk in hospital room?: Total Help needed climbing 3-5 steps with a railing? : Total 6 Click Score: 6    End of Session   Activity Tolerance: Treatment limited secondary to medical complications (Comment) (slow regression of anesthesia) Patient left: in bed;with call bell/phone within reach;with family/visitor present Nurse Communication: Mobility status (pt not ready for same day d/c) PT Visit Diagnosis: Unsteadiness on feet (R26.81);Other abnormalities of gait and mobility (R26.89);Muscle weakness (generalized) (M62.81);Difficulty in walking, not elsewhere classified (R26.2);Pain Pain - Right/Left: Right Pain - part of body: Knee;Leg     Time: 2841-3244 PT Time Calculation (min) (ACUTE ONLY): 13 min  Charges:    $Therapeutic Exercise: 8-22 mins PT General Charges $$ ACUTE PT VISIT: 1 Visit                     Johnny Bridge, PT Acute Rehab   Jacqualyn Posey 09/18/2022, 5:58 PM

## 2022-09-19 ENCOUNTER — Other Ambulatory Visit: Payer: Self-pay

## 2022-09-19 ENCOUNTER — Encounter (HOSPITAL_COMMUNITY): Payer: Self-pay | Admitting: Orthopedic Surgery

## 2022-09-19 DIAGNOSIS — Z7982 Long term (current) use of aspirin: Secondary | ICD-10-CM | POA: Diagnosis not present

## 2022-09-19 DIAGNOSIS — I10 Essential (primary) hypertension: Secondary | ICD-10-CM | POA: Diagnosis not present

## 2022-09-19 DIAGNOSIS — M1711 Unilateral primary osteoarthritis, right knee: Secondary | ICD-10-CM | POA: Diagnosis not present

## 2022-09-19 DIAGNOSIS — Z8616 Personal history of COVID-19: Secondary | ICD-10-CM | POA: Diagnosis not present

## 2022-09-19 DIAGNOSIS — Z86718 Personal history of other venous thrombosis and embolism: Secondary | ICD-10-CM | POA: Diagnosis not present

## 2022-09-19 DIAGNOSIS — Z79899 Other long term (current) drug therapy: Secondary | ICD-10-CM | POA: Diagnosis not present

## 2022-09-19 DIAGNOSIS — Z87891 Personal history of nicotine dependence: Secondary | ICD-10-CM | POA: Diagnosis not present

## 2022-09-19 LAB — CBC
HCT: 33.5 % — ABNORMAL LOW (ref 36.0–46.0)
Hemoglobin: 10.8 g/dL — ABNORMAL LOW (ref 12.0–15.0)
MCH: 30.3 pg (ref 26.0–34.0)
MCHC: 32.2 g/dL (ref 30.0–36.0)
MCV: 94.1 fL (ref 80.0–100.0)
Platelets: 160 10*3/uL (ref 150–400)
RBC: 3.56 MIL/uL — ABNORMAL LOW (ref 3.87–5.11)
RDW: 13.6 % (ref 11.5–15.5)
WBC: 11.6 10*3/uL — ABNORMAL HIGH (ref 4.0–10.5)
nRBC: 0 % (ref 0.0–0.2)

## 2022-09-19 LAB — BASIC METABOLIC PANEL
Anion gap: 7 (ref 5–15)
BUN: 26 mg/dL — ABNORMAL HIGH (ref 8–23)
CO2: 23 mmol/L (ref 22–32)
Calcium: 8.9 mg/dL (ref 8.9–10.3)
Chloride: 106 mmol/L (ref 98–111)
Creatinine, Ser: 0.95 mg/dL (ref 0.44–1.00)
GFR, Estimated: >60 mL/min (ref >60)
Glucose, Bld: 126 mg/dL — ABNORMAL HIGH (ref 70–99)
Potassium: 4.1 mmol/L (ref 3.5–5.1)
Sodium: 136 mmol/L (ref 135–145)

## 2022-09-19 MED ORDER — OXYBUTYNIN CHLORIDE ER 5 MG PO TB24: 5.0000 mg | ORAL_TABLET | Freq: Every day | ORAL | Status: DC

## 2022-09-19 MED ORDER — HYDROCHLOROTHIAZIDE 25 MG PO TABS
25.0000 mg | ORAL_TABLET | Freq: Every day | ORAL | Status: DC
  Filled 2022-09-19: qty 1

## 2022-09-19 MED ORDER — IRBESARTAN 75 MG PO TABS
37.5000 mg | ORAL_TABLET | Freq: Every day | ORAL | Status: DC
  Filled 2022-09-19: qty 1

## 2022-09-19 NOTE — Progress Notes (Signed)
Physical Therapy Treatment Patient Details Name: Megan Crane MRN: [id number] DOB: 1950-06-09 Today's Date: 09/19/2022   History of Present Illness 72 yo female presents to therapy s/p R medial unicompartmental knee arthoplasty on 09/18/2022 due to failure of conservative measures. Pt PMH includes but is not limited: syncope, B CTS, angina, GERD, raynaud's, HLD, and anxiety.    PT Comments  Pt is mobilizing well, she ambulated 30' with RW, completed stair training, and demonstrates good understanding of HEP. She is ready to DC home from a PT standpoint.      If plan is discharge home, recommend the following: Assistance with cooking/housework;Assist for transportation;Help with stairs or ramp for entrance;A little help with walking and/or transfers;A little help with bathing/dressing/bathroom   Can travel by private vehicle        Equipment Recommendations  Rolling walker (2 wheels)    Recommendations for Other Services       Precautions / Restrictions Precautions Precautions: Knee;Fall Precaution Booklet Issued: Yes (comment) Precaution Comments: reviewed no pillow under knee Restrictions Weight Bearing Restrictions: No     Mobility  Bed Mobility Overal bed mobility: Modified Independent             General bed mobility comments: HOB up, used gait belt as leg lifter    Transfers Overall transfer level: Needs assistance Equipment used: Rolling walker (2 wheels) Transfers: Sit to/from Stand Sit to Stand: Contact guard assist           General transfer comment: VCs hand placement    Ambulation/Gait Ambulation/Gait assistance: Contact guard assist Gait Distance (Feet): 130 Feet Assistive device: Rolling walker (2 wheels) Gait Pattern/deviations: Step-to pattern, Decreased step length - right, Decreased step length - left Gait velocity: decr     General Gait Details: VCs sequencing and positioning in RW   Stairs Stairs: Yes Stairs assistance: Min  assist Stair Management: No rails, Backwards, With walker, Step to pattern Number of Stairs: 3 General stair comments: spouse assisted with stabilizing RW, VCs sequencing   Wheelchair Mobility     Tilt Bed    Modified Rankin (Stroke Patients Only)       Balance Overall balance assessment: Modified Independent                                          Cognition Arousal: Alert Behavior During Therapy: WFL for tasks assessed/performed Overall Cognitive Status: Within Functional Limits for tasks assessed                                          Exercises Total Joint Exercises Ankle Circles/Pumps: AROM, Both, 15 reps Quad Sets: AAROM, 5 reps, Both, Supine Short Arc Quad: Right, AAROM, Supine, 10 reps Heel Slides: Right, 5 reps, AAROM, Supine Hip ABduction/ADduction: AROM, Right, 5 reps, Supine Straight Leg Raises: Right, 5 reps, AROM Long Arc Quad: AROM, Right, 5 reps, Seated Knee Flexion: AAROM, Right, 10 reps, Seated Goniometric ROM: 5-65* AAROM R knee    General Comments        Pertinent Vitals/Pain Pain Assessment Pain Score: 4  Pain Location: R knee with exercises Pain Descriptors / Indicators: Grimacing Pain Intervention(s): Limited activity within patient's tolerance, Monitored during session, Premedicated before session, Ice applied    Home Living  Prior Function            PT Goals (current goals can now be found in the care plan section) Acute Rehab PT Goals Patient Stated Goal: to be able to go camping with the RV, visit and play with the grandchildren and walk PT Goal Formulation: With patient/family Time For Goal Achievement: 10/02/22 Potential to Achieve Goals: Good Progress towards PT goals: Progressing toward goals    Frequency    7X/week      PT Plan      Co-evaluation              AM-PAC PT "6 Clicks" Mobility   Outcome Measure  Help needed turning  from your back to your side while in a flat bed without using bedrails?: None Help needed moving from lying on your back to sitting on the side of a flat bed without using bedrails?: A Little Help needed moving to and from a bed to a chair (including a wheelchair)?: None Help needed standing up from a chair using your arms (e.g., wheelchair or bedside chair)?: None Help needed to walk in hospital room?: None Help needed climbing 3-5 steps with a railing? : A Little 6 Click Score: 22    End of Session Equipment Utilized During Treatment: Gait belt Activity Tolerance: Patient tolerated treatment well Patient left: in chair;with chair alarm set;with call bell/phone within reach Nurse Communication: Mobility status PT Visit Diagnosis: Unsteadiness on feet (R26.81);Other abnormalities of gait and mobility (R26.89);Muscle weakness (generalized) (M62.81);Difficulty in walking, not elsewhere classified (R26.2);Pain Pain - Right/Left: Right Pain - part of body: Knee     Time: 1020-1121 PT Time Calculation (min) (ACUTE ONLY): 61 min  Charges:    $Gait Training: 23-37 mins $Therapeutic Exercise: 23-37 mins PT General Charges $$ ACUTE PT VISIT: 1 Visit                    Tamala Ser PT 09/19/2022  Acute Rehabilitation Services  Office [phone]

## 2022-09-19 NOTE — Progress Notes (Signed)
Orthopedic Tech Progress Note Patient Details:  Megan Crane 18-Sep-1950 [id number]  Ortho Devices Type of Ortho Device: Bone foam zero knee Ortho Device/Splint Location: right Ortho Device/Splint Interventions: Ordered, Application, Adjustment   Post Interventions Patient Tolerated: Well Instructions Provided: Care of device, Adjustment of device  Kizzie Fantasia 09/19/2022, 9:21 AM

## 2022-09-19 NOTE — Plan of Care (Signed)
Problem: Education: Goal: Knowledge of the prescribed therapeutic regimen will improve Outcome: Adequate for Discharge Goal: Individualized Educational Video(s) Outcome: Adequate for Discharge   Problem: Activity: Goal: Ability to avoid complications of mobility impairment will improve Outcome: Adequate for Discharge Goal: Range of joint motion will improve Outcome: Adequate for Discharge   Problem: Clinical Measurements: Goal: Postoperative complications will be avoided or minimized Outcome: Adequate for Discharge   Problem: Pain Management: Goal: Pain level will decrease with appropriate interventions Outcome: Adequate for Discharge   Problem: Skin Integrity: Goal: Will show signs of wound healing Outcome: Adequate for Discharge   Problem: Education: Goal: Knowledge of General Education information will improve Description: Including pain rating scale, medication(s)/side effects and non-pharmacologic comfort measures Outcome: Adequate for Discharge   Problem: Health Behavior/Discharge Planning: Goal: Ability to manage health-related needs will improve Outcome: Adequate for Discharge   Problem: Clinical Measurements: Goal: Ability to maintain clinical measurements within normal limits will improve Outcome: Adequate for Discharge Goal: Will remain free from infection Outcome: Adequate for Discharge Goal: Diagnostic test results will improve Outcome: Adequate for Discharge Goal: Respiratory complications will improve Outcome: Adequate for Discharge Goal: Cardiovascular complication will be avoided Outcome: Adequate for Discharge   Problem: Activity: Goal: Risk for activity intolerance will decrease Outcome: Adequate for Discharge   Problem: Nutrition: Goal: Adequate nutrition will be maintained Outcome: Adequate for Discharge   Problem: Coping: Goal: Level of anxiety will decrease Outcome: Adequate for Discharge   Problem: Elimination: Goal: Will not  experience complications related to bowel motility Outcome: Adequate for Discharge Goal: Will not experience complications related to urinary retention Outcome: Adequate for Discharge   Problem: Pain Managment: Goal: General experience of comfort will improve Outcome: Adequate for Discharge   Problem: Safety: Goal: Ability to remain free from injury will improve Outcome: Adequate for Discharge   Problem: Skin Integrity: Goal: Risk for impaired skin integrity will decrease Outcome: Adequate for Discharge   Problem: Education: Goal: Knowledge of the prescribed therapeutic regimen will improve Outcome: Adequate for Discharge Goal: Individualized Educational Video(s) Outcome: Adequate for Discharge   Problem: Activity: Goal: Ability to avoid complications of mobility impairment will improve Outcome: Adequate for Discharge Goal: Range of joint motion will improve Outcome: Adequate for Discharge   Problem: Clinical Measurements: Goal: Postoperative complications will be avoided or minimized Outcome: Adequate for Discharge   Problem: Pain Management: Goal: Pain level will decrease with appropriate interventions Outcome: Adequate for Discharge   Problem: Skin Integrity: Goal: Will show signs of wound healing Outcome: Adequate for Discharge   Problem: Education: Goal: Knowledge of General Education information will improve Description: Including pain rating scale, medication(s)/side effects and non-pharmacologic comfort measures Outcome: Adequate for Discharge   Problem: Health Behavior/Discharge Planning: Goal: Ability to manage health-related needs will improve Outcome: Adequate for Discharge   Problem: Clinical Measurements: Goal: Ability to maintain clinical measurements within normal limits will improve Outcome: Adequate for Discharge Goal: Will remain free from infection Outcome: Adequate for Discharge Goal: Diagnostic test results will improve Outcome: Adequate  for Discharge Goal: Respiratory complications will improve Outcome: Adequate for Discharge Goal: Cardiovascular complication will be avoided Outcome: Adequate for Discharge   Problem: Activity: Goal: Risk for activity intolerance will decrease Outcome: Adequate for Discharge   Problem: Nutrition: Goal: Adequate nutrition will be maintained Outcome: Adequate for Discharge   Problem: Coping: Goal: Level of anxiety will decrease Outcome: Adequate for Discharge   Problem: Elimination: Goal: Will not experience complications related to bowel motility Outcome: Adequate for Discharge Goal:  Will not experience complications related to urinary retention Outcome: Adequate for Discharge   Problem: Pain Managment: Goal: General experience of comfort will improve Outcome: Adequate for Discharge   Problem: Safety: Goal: Ability to remain free from injury will improve Outcome: Adequate for Discharge   Problem: Skin Integrity: Goal: Risk for impaired skin integrity will decrease Outcome: Adequate for Discharge

## 2022-09-19 NOTE — Progress Notes (Signed)
Ortho Tech called/paged x 2 for Bone Foam with no response per NS. AC called and no access to Ortho closet. Will make incoming nurse aware of the need for Bone Foam.

## 2022-09-19 NOTE — Progress Notes (Signed)
Reviewed written d/c instructions w pt and her husband, all questions answered and they both verbalized understanding. D/C via w/c w all belongings in stable condition.

## 2022-09-19 NOTE — Discharge Summary (Signed)
Physician Discharge Summary  Patient ID: Megan Crane MRN: [id number] DOB/AGE: Feb 22, 1950 72 y.o.  Admit date: 09/18/2022 Discharge date: 09/19/2022  Admission Diagnoses:  S/P right unicompartmental knee replacement  Discharge Diagnoses:  Principal Problem:   S/P right unicompartmental knee replacement   Past Medical History:  Diagnosis Date   Anxiety disorder    hx vasovagal responses   Arthritis    Cancer (HCC)    Deep vein thrombosis (DVT) (HCC)    hx of in left left after covid in 2021   Headache(784.0)    History of kidney stones    Hydronephrosis, right    Hyperlipidemia    Hypertension    Nephrolithiasis    Pneumonia    Positive nasal culture for methicillin resistant Staphylococcus aureus    Right ureteral stone    Thrombophlebitis of leg, left, superficial 11/17/2020   Urinary incontinence    sees Dr. McDiarmid    Vasovagal syncope 08/02/2022    Surgeries: Procedure(s): UNICOMPARTMENTAL KNEE on 09/18/2022   Consultants (if any):   Discharged Condition: Improved  Hospital Course: ELGA DEUSCHLE is an 72 y.o. female who was admitted 09/18/2022 with a diagnosis of S/P right unicompartmental knee replacement and went to the operating room on 09/18/2022 and underwent the above named procedures.    She was given perioperative antibiotics:  Anti-infectives (From admission, onward)    Start     Dose/Rate Route Frequency Ordered Stop   09/18/22 1908  ceFAZolin (ANCEF) 2-4 GM/100ML-% IVPB       Note to Pharmacy: Roel Cluck A: cabinet override      09/18/22 1908 09/18/22 1908   09/18/22 1400  ceFAZolin (ANCEF) IVPB 2g/100 mL premix        2 g 200 mL/hr over 30 Minutes Intravenous Every 6 hours 09/18/22 1003 09/19/22 0159   09/18/22 0600  ceFAZolin (ANCEF) IVPB 2g/100 mL premix        2 g 200 mL/hr over 30 Minutes Intravenous On call to O.R. 09/18/22 4098 09/18/22 0740     .  She was given sequential compression devices, early ambulation, and eliquis for  DVT prophylaxis.  She benefited maximally from the hospital stay and there were no complications.    Recent vital signs:  Vitals:   09/19/22 1024 09/19/22 1419  BP: 107/69 104/60  Pulse: (!) 56 (!) 54  Resp: 18 18  Temp: 97.6 F (36.4 C) (!) 97.5 F (36.4 C)  SpO2: 100% 100%    Recent laboratory studies:  Lab Results  Component Value Date   HGB 10.8 (L) 09/19/2022   HGB 13.3 09/05/2022   HGB 13.8 08/07/2022   Lab Results  Component Value Date   WBC 11.6 (H) 09/19/2022   PLT 160 09/19/2022   Lab Results  Component Value Date   INR 1.0 08/07/2022   Lab Results  Component Value Date   NA 136 09/19/2022   K 4.1 09/19/2022   CL 106 09/19/2022   CO2 23 09/19/2022   BUN 26 (H) 09/19/2022   CREATININE 0.95 09/19/2022   GLUCOSE 126 (H) 09/19/2022    Discharge Medications:   Allergies as of 09/19/2022       Reactions   Lidocaine Anaphylaxis, Shortness Of Breath, Other (See Comments)   "Eyes crossed"   Escitalopram    Sleepy   Levofloxacin Itching, Other (See Comments)   disoriented   Oxycodone-aspirin Other (See Comments)   "VERY DIZZY"        Medication List  STOP taking these medications    aspirin 81 MG chewable tablet       TAKE these medications    acetaminophen 500 MG tablet Commonly known as: TYLENOL Take 2 tablets (1,000 mg total) by mouth every 8 (eight) hours as needed.   apixaban 2.5 MG Tabs tablet Commonly known as: Eliquis Take 1 tablet (2.5 mg total) by mouth 2 (two) times daily.   celecoxib 100 MG capsule Commonly known as: CeleBREX Take 1 capsule (100 mg total) by mouth 2 (two) times daily for 14 days.   estradiol 0.1 MG/GM vaginal cream Commonly known as: ESTRACE Place 1 Applicatorful vaginally 2 (two) times a week.   hydrochlorothiazide 25 MG tablet Commonly known as: HYDRODIURIL Take 1 tablet (25 mg total) by mouth daily.   methocarbamol 500 MG tablet Commonly known as: ROBAXIN Take 1 tablet (500 mg total) by  mouth every 8 (eight) hours as needed for up to 10 days for muscle spasms.   omeprazole 40 MG capsule Commonly known as: PRILOSEC Take 1 capsule (40 mg total) by mouth daily for 28 days.   ondansetron 4 MG tablet Commonly known as: Zofran Take 1 tablet (4 mg total) by mouth every 8 (eight) hours as needed for up to 14 days for nausea or vomiting.   oxybutynin 5 MG 24 hr tablet Commonly known as: DITROPAN-XL Take 1 tablet (5 mg total) by mouth at bedtime.   oxyCODONE 5 MG immediate release tablet Commonly known as: Roxicodone Take 1 tablet (5 mg total) by mouth every 4 (four) hours as needed for up to 7 days for severe pain or moderate pain.   polyethylene glycol 17 g packet Commonly known as: MiraLax Take 17 g by mouth daily.   valsartan 40 MG tablet Commonly known as: DIOVAN Take 1 tablet (40 mg total) by mouth daily.        Diagnostic Studies: DG Knee Right Port  Result Date: 09/18/2022 CLINICAL DATA:  Postop right knee. EXAM: PORTABLE RIGHT KNEE - 1-2 VIEW COMPARISON:  None Available. FINDINGS: Medial compartment hemiarthroplasty in expected alignment. No periprosthetic lucency or fracture. Recent postsurgical change includes air and edema in the soft tissues and joint space. IMPRESSION: Medial compartment hemiarthroplasty without immediate postoperative complication. Electronically Signed   By: Narda Rutherford M.D.   On: 09/18/2022 10:28    Disposition: Discharge disposition: 01-Home or Self Care       Discharge Instructions     Call MD / Call 911   Complete by: As directed    If you experience chest pain or shortness of breath, CALL 911 and be transported to the hospital emergency room.  If you develope a fever above 101 F, pus (white drainage) or increased drainage or redness at the wound, or calf pain, call your surgeon's office.   Call MD / Call 911   Complete by: As directed    If you experience chest pain or shortness of breath, CALL 911 and be transported to  the hospital emergency room.  If you develope a fever above 101 F, pus (white drainage) or increased drainage or redness at the wound, or calf pain, call your surgeon's office.   Constipation Prevention   Complete by: As directed    Drink plenty of fluids.  Prune juice may be helpful.  You may use a stool softener, such as Colace (over the counter) 100 mg twice a day.  Use MiraLax (over the counter) for constipation as needed.   Constipation Prevention  Complete by: As directed    Drink plenty of fluids.  Prune juice may be helpful.  You may use a stool softener, such as Colace (over the counter) 100 mg twice a day.  Use MiraLax (over the counter) for constipation as needed.   Diet - low sodium heart healthy   Complete by: As directed    Diet - low sodium heart healthy   Complete by: As directed    Do not put a pillow under the knee. Place it under the heel.   Complete by: As directed    Driving restrictions   Complete by: As directed    No driving for 4-6 weeks   Increase activity slowly as tolerated   Complete by: As directed    Increase activity slowly as tolerated   Complete by: As directed    Post-operative opioid taper instructions:   Complete by: As directed    POST-OPERATIVE OPIOID TAPER INSTRUCTIONS: It is important to wean off of your opioid medication as soon as possible. If you do not need pain medication after your surgery it is ok to stop day one. Opioids include: Codeine, Hydrocodone(Norco, Vicodin), Oxycodone(Percocet, oxycontin) and hydromorphone amongst others.  Long term and even short term use of opiods can cause: Increased pain response Dependence Constipation Depression Respiratory depression And more.  Withdrawal symptoms can include Flu like symptoms Nausea, vomiting And more Techniques to manage these symptoms Hydrate well Eat regular healthy meals Stay active Use relaxation techniques(deep breathing, meditating, yoga) Do Not substitute Alcohol to  help with tapering If you have been on opioids for less than two weeks and do not have pain than it is ok to stop all together.  Plan to wean off of opioids This plan should start within one week post op of your joint replacement. Maintain the same interval or time between taking each dose and first decrease the dose.  Cut the total daily intake of opioids by one tablet each day Next start to increase the time between doses. The last dose that should be eliminated is the evening dose.      Post-operative opioid taper instructions:   Complete by: As directed    POST-OPERATIVE OPIOID TAPER INSTRUCTIONS: It is important to wean off of your opioid medication as soon as possible. If you do not need pain medication after your surgery it is ok to stop day one. Opioids include: Codeine, Hydrocodone(Norco, Vicodin), Oxycodone(Percocet, oxycontin) and hydromorphone amongst others.  Long term and even short term use of opiods can cause: Increased pain response Dependence Constipation Depression Respiratory depression And more.  Withdrawal symptoms can include Flu like symptoms Nausea, vomiting And more Techniques to manage these symptoms Hydrate well Eat regular healthy meals Stay active Use relaxation techniques(deep breathing, meditating, yoga) Do Not substitute Alcohol to help with tapering If you have been on opioids for less than two weeks and do not have pain than it is ok to stop all together.  Plan to wean off of opioids This plan should start within one week post op of your joint replacement. Maintain the same interval or time between taking each dose and first decrease the dose.  Cut the total daily intake of opioids by one tablet each day Next start to increase the time between doses. The last dose that should be eliminated is the evening dose.      TED hose   Complete by: As directed    Use stockings (TED hose) for 2 weeks on both leg(s).  Then for 2 more weeks on the  surgical leg.  You may remove them at night for sleeping.        Follow-up Information     Joen Laura, MD. Go on 10/03/2022.   Specialty: Orthopedic Surgery Why: your appointment is scheduled for 2:45 Contact information: [address] [place] [address] [phone]         [facility] - [facility]. Go to.   Why: Your outpatient physicial therapy will start at 10:15. Please arrive by 10:00 Contact information: [phone]                   Discharge Instructions      Information on my medicine - ELIQUIS (apixaban)  This medication education was reviewed with me or my healthcare representative as part of my discharge preparation.  T=  Why was Eliquis prescribed for you? Eliquis was prescribed for you to reduce the risk of blood clots forming after orthopedic surgery.    What do You need to know about Eliquis? Take your Eliquis TWICE DAILY - one tablet in the morning and one tablet in the evening with or without food.  It would be best to take the dose about the same time each day.  If you have difficulty swallowing the tablet whole please discuss with your pharmacist how to take the medication safely.  Take Eliquis exactly as prescribed by your doctor and DO NOT stop taking Eliquis without talking to the doctor who prescribed the medication.  Stopping without other medication to take the place of Eliquis may increase your risk of developing a clot.  After discharge, you should have regular check-up appointments with your healthcare provider that is prescribing your Eliquis.  What do you do if you miss a dose? If a dose of ELIQUIS is not taken at the scheduled time, take it as soon as possible on the same day and twice-daily administration should be resumed.  The dose should not be doubled to make up for a missed dose.  Do not take more than one tablet of ELIQUIS at the same time.  Important Safety Information A possible side effect of  Eliquis is bleeding. You should call your healthcare provider right away if you experience any of the following: Bleeding from an injury or your nose that does not stop. Unusual colored urine (red or dark brown) or unusual colored stools (red or black). Unusual bruising for unknown reasons. A serious fall or if you hit your head (even if there is no bleeding).  Some medicines may interact with Eliquis and might increase your risk of bleeding or clotting while on Eliquis. To help avoid this, consult your healthcare provider or pharmacist prior to using any new prescription or non-prescription medications, including herbals, vitamins, non-steroidal anti-inflammatory drugs (NSAIDs) and supplements.  This website has more information on Eliquis (apixaban): http://www.eliquis.com/eliquis/home     Signed: Emali Heyward A [place] 09/19/2022, 3:00 PM

## 2022-09-19 NOTE — TOC Transition Note (Signed)
Transition of Care [facility]) - CM/SW Discharge Note  Patient Details  Name: Megan Crane MRN: [id number] Date of Birth: [birth year]  Transition of Care [facility]) CM/SW Contact:  Ewing Schlein, LCSW Phone Number: 09/19/2022, 10:21 AM  Clinical Narrative: Patient is expected to discharge home after working with PT. CSW met with patient to confirm discharge plan and needs. Patient will go home with OPPT at [facility]. Patient will need a rolling walker, which [employer] delivered to patient's room. Patient reported she private paid for a BSC. TOC signing off.    Final next level of care: OP Rehab Barriers to Discharge: No Barriers Identified  Patient Goals and CMS Choice CMS Medicare.gov Compare Post Acute Care list provided to:: Patient Choice offered to / list presented to : Patient  Discharge Plan and Services Additional resources added to the After Visit Summary for       DME Arranged: Walker rolling DME Agency: [employer] Representative spoke with at DME Agency: Prearranged in orthopedist's office  Social Determinants of Health (SDOH) Interventions SDOH Screenings   Food Insecurity: No Food Insecurity (09/18/2022)  Housing: Low Risk  (09/18/2022)  Transportation Needs: No Transportation Needs (09/18/2022)  Utilities: Not At Risk (09/18/2022)  Alcohol Screen: Low Risk  (10/28/2021)  Depression (PHQ2-9): Low Risk  (06/30/2022)  Financial Resource Strain: Low Risk  (10/28/2021)  Physical Activity: Inactive (10/28/2021)  Social Connections: Socially Integrated (10/28/2021)  Stress: No Stress Concern Present (10/28/2021)  Tobacco Use: Medium Risk (09/18/2022)   Readmission Risk Interventions     No data to display

## 2022-09-19 NOTE — Progress Notes (Signed)
     Subjective:  Patient reports pain as mild.  Did not clear PT yesterday due to quad inhibition. Still having weakness with knee extension. Discussed this is likely side effect of the adductor block which likely has caused some transient femoral nerve weakness. Denies distal n/t. Hopeful she will be able to clear PT today to go home.  Objective:   VITALS:   Vitals:   09/18/22 2007 09/18/22 2205 09/19/22 0134 09/19/22 0518  BP: 114/65 101/63 103/71 103/75  Pulse:  68 (!) 49 (!) 45  Resp: 16 17 16 17   Temp: 97.8 F (36.6 C) 98 F (36.7 C) 97.9 F (36.6 C) 97.8 F (36.6 C)  TempSrc: Oral Oral Oral Oral  SpO2: 98% 98% 98% 100%  Weight:      Height:        Sensation intact distally Intact pulses distally Dorsiflexion/Plantar flexion intact Incision: dressing C/D/I Compartment soft   Lab Results  Component Value Date   WBC 11.6 (H) 09/19/2022   HGB 10.8 (L) 09/19/2022   HCT 33.5 (L) 09/19/2022   MCV 94.1 09/19/2022   PLT 160 09/19/2022   BMET    Component Value Date/Time   NA 136 09/19/2022 0337   K 4.1 09/19/2022 0337   CL 106 09/19/2022 0337   CO2 23 09/19/2022 0337   GLUCOSE 126 (H) 09/19/2022 0337   BUN 26 (H) 09/19/2022 0337   CREATININE 0.95 09/19/2022 0337   CREATININE 0.81 06/30/2022 1354   CALCIUM 8.9 09/19/2022 0337   EGFR 73 10/25/2020 0000   GFRNONAA >60 09/19/2022 0337     Xray: medial UKA componetns in good position, no adverse features.  Assessment/Plan: 1 Day Post-Op   Principal Problem:   S/P right unicompartmental knee replacement  S/p R medial UKA 09/18/22  Post op recs: WB: WBAT Abx: ancef periop Imaging: PACU xrays DVT prophylaxis: Eliquis 2.5 mg twice daily x4 weeks given history of lower extremity blood clot. Follow up: 2 weeks after surgery for a wound check with Dr. Blanchie Dessert at [facility].  Address: [address], [place], [place] [address]  Office Phone: [phone]  Joen Laura 09/19/2022, 7:02 AM   Weber Cooks, MD  Contact information:   [phone] 7am-5pm epic message Dr. Blanchie Dessert, or call office for patient follow up: [phone] After hours and holidays please check [email or web address] for group call information for [facility]

## 2022-09-20 NOTE — Therapy (Signed)
OUTPATIENT PHYSICAL THERAPY LOWER EXTREMITY EVALUATION   Patient Name: Megan Crane MRN: [id number] DOB:16-Aug-1950, 72 y.o., female Today's Date: 09/21/2022   END OF SESSION:  PT End of Session - 09/21/22 1020     Visit Number 1    Date for PT Re-Evaluation 11/02/22    Authorization Type [employer]    PT Start Time 1015    PT Stop Time 1128   10 minute bathroom break mid-visit   PT Time Calculation (min) 73 min    Activity Tolerance Patient tolerated treatment well    Behavior During Therapy WFL for tasks assessed/performed             Past Medical History:  Diagnosis Date   Anxiety disorder    hx vasovagal responses   Arthritis    Cancer (HCC)    Deep vein thrombosis (DVT) (HCC)    hx of in left left after covid in 2021   Headache(784.0)    History of kidney stones    Hydronephrosis, right    Hyperlipidemia    Hypertension    Nephrolithiasis    Pneumonia    Positive nasal culture for methicillin resistant Staphylococcus aureus    Right ureteral stone    Thrombophlebitis of leg, left, superficial 11/17/2020   Urinary incontinence    sees Dr. McDiarmid    Vasovagal syncope 08/02/2022   Past Surgical History:  Procedure Laterality Date   BLADDER REPAIR     COLONOSCOPY  12/28/2015   per Dr. Adela Lank, serrated polyps and diverticula, repeat in 3 yrs    CYSTOSCOPY  06/09/2011   Procedure: CYSTOSCOPY;  Surgeon: Martina Sinner, MD;  Location: [facility];  Service: Urology;  Laterality: N/A;   CYSTOSCOPY W/ URETERAL STENT PLACEMENT  02/03/2014   Procedure: CYSTOSCOPY WITH  RIGHT RETROGRADE PYELOGRAM/ RIGHT URETERAL STENT PLACEMENT;  Surgeon: Anner Crete, MD;  Location: [facility];  Service: Urology;;   CYSTOSCOPY WITH BIOPSY  02/03/2014   Procedure: CYSTOSCOPY WITH BIOPSY;  Surgeon: Anner Crete, MD;  Location: [facility];  Service: Urology;;   PARTIAL KNEE ARTHROPLASTY Right 09/18/2022   Procedure: UNICOMPARTMENTAL KNEE;   Surgeon: Joen Laura, MD;  Location: [facility];  Service: Orthopedics;  Laterality: Right;   PILONIDAL CYST EXCISION  1974   RECTOCELE REPAIR  06/09/2011   Procedure: POSTERIOR REPAIR (RECTOCELE);  Surgeon: Martina Sinner, MD;  Location: [facility];  Service: Urology;  Laterality: N/A;  Xenform graft 6x10   RIGHT URETEROSCOPIC STONE EXTRACTION / STENT PLACEMENT  03/13/2000   URETEROSCOPY Right 02/03/2014   Procedure: URETEROSCOPY WITH MANAGEMENT OF URETERAL STRICTURE;  Surgeon: Anner Crete, MD;  Location[facility];  Service: Urology;  Laterality: Right;   VAGINAL HYSTERECTOMY  06/09/2011   Procedure: HYSTERECTOMY VAGINAL;  Surgeon: Mickel Baas, MD;  Location: [facility];  Service: Gynecology;  Laterality: N/A;   VAGINAL PROLAPSE REPAIR  06/09/2011   Procedure: VAGINAL VAULT SUSPENSION;  Surgeon: Martina Sinner, MD;  Location: [facility];  Service: Urology;  Laterality: N/A;   VEIN LIGATION AND STRIPPING     LEFT LEG   Patient Active Problem List   Diagnosis Date Noted   S/P right unicompartmental knee replacement 09/18/2022   IC (interstitial cystitis) 09/04/2022   Pelvic pressure in female 08/22/2022   Visit for pre-operative examination 08/07/2022   Vasovagal syncope 08/02/2022   Nasal dryness 06/30/2022   Precancerous skin lesion 06/30/2022   Bilateral carpal tunnel syndrome 03/02/2022  Swelling of both hands 03/02/2022   Atypical chest pain 03/02/2022   OAB (overactive bladder) 11/23/2021   Corn of foot 11/23/2021   Atrophic vaginitis 11/23/2021   COVID-19 virus infection 11/17/2020   Raynaud's phenomenon without gangrene 11/17/2020   Primary osteoarthritis involving multiple joints 11/24/2019   GERD (gastroesophageal reflux disease) 12/26/2016   Edema 05/29/2014   Nasal colonization with methicillin-resistant Staphylococcus aureus 01/22/2014   Varicose veins of bilateral lower extremities with other complications 12/11/2012   Allergic rhinitis  11/29/2009   MOTION SICKNESS 11/29/2009   Hyperlipidemia 08/31/2008   Anxiety state 08/31/2008   Primary hypertension 08/31/2008   PILONIDAL CYST 08/31/2008   HEADACHE 08/31/2008   URINARY INCONTINENCE 08/31/2008   NEPHROLITHIASIS, HX OF 08/31/2008   POSTNASAL DRIP SYNDROME 05/28/2006    PCP: Nelwyn Salisbury, MD   REFERRING PROVIDER: Joen Laura, MD   REFERRING DIAG: [phone] (ICD-10-CM) - Status post right partial knee replacement   THERAPY DIAG:  Stiffness of right knee, not elsewhere classified  Acute pain of right knee  Other abnormalities of gait and mobility  Muscle weakness (generalized)  Localized edema  RATIONALE FOR EVALUATION AND TREATMENT: Rehabilitation  ONSET DATE: 09/18/2022 - R unicompartmental knee replacement  NEXT MD VISIT: 10/06/2022   SUBJECTIVE:                                                                                                                                                                                                         SUBJECTIVE STATEMENT: Patient is 3 days postop from R unicondylar knee replacement on 09/18/2022.  She feels like she is settling in okay at home.  Pt reports she tries to change position frequently, sometimes allowing her leg to dangle and at other times supporting the leg with a pillow under her ankles to straighten her knee out.  Pain has been moderate.  Currently using RW for all mobility, but has SPC available at home.  Has tub bench on order, and reports she should be getting an ice machine from the MD office tomorrow.  PAIN: Are you having pain? Yes: NPRS scale: 5-6/10 Pain location: R anterior lateral knee Pain description: sharp laterally, achy Aggravating factors: prolonged sitting and first getting up Relieving factors: walking  PERTINENT HISTORY:  OA, anxiety, HTN, Raynaud's phenomenon, GERD, B CTS, DVT s/p COVID in 2021, syncope  PRECAUTIONS: None  RED FLAGS: None  WEIGHT BEARING  RESTRICTIONS: No  FALLS:  Has patient fallen in last 6 months? 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 Go back to work part-time."   OBJECTIVE: (objective measures completed at initial evaluation unless otherwise dated)  DIAGNOSTIC FINDINGS:  09/18/22 - DG R knee:  IMPRESSION: Medial compartment hemiarthroplasty without immediate postoperative complication.  PATIENT SURVEYS:  LEFS 13 / 80 = 16.3 %  COGNITION: Overall cognitive status: Within functional limits for tasks assessed    SENSATION: WFL  EDEMA:  Circumferential: 44 cm at joint line, 53 cm (5 cm above joint line)  MUSCLE LENGTH: Hamstrings: Mild/mod tight R>L ITB: Mild/mod tight R>L Piriformis: Mild tight R>L Hip flexors: Mild tight R>L Quads: Mod tight R>L Heelcord: NT  POSTURE:  No Significant postural limitations  PALPATION: R knee swollen and slightly warm to touch with mild erythema present.  Decreased R patellar mobility in all directions.  LOWER EXTREMITY ROM:  Active ROM Right eval Left eval  Knee flexion 77   Knee extension -15 LAQ    Passive ROM Right eval  Knee flexion 82  Knee extension -5   LOWER EXTREMITY MMT:  MMT Right eval Left   eval  Hip flexion 4+ 5  Hip extension 4 4  Hip abduction 4- 4  Hip adduction 4 4+  Hip internal rotation 4 4+  Hip external rotation 4 4  Knee flexion 4 5  Knee extension 4  5  Ankle dorsiflexion 4 4  Ankle plantarflexion    Ankle inversion    Ankle eversion     (Blank rows = not tested)  FUNCTIONAL TESTS:  5 times sit to stand: 29.60 sec with  weight shift to L LE Timed up and go (TUG): 23.79 sec with RW  GAIT: Distance walked: Clinic distances Assistive device utilized: Environmental consultant - 2 wheeled Level of assistance: Modified independence Gait pattern: step through pattern and decreased hip/knee flexion- Right Comments: Ambulates with stiff R knee but good heel strike on weight acceptance   TODAY'S TREATMENT:   09/21/22 - Eval THERAPEUTIC EXERCISE: to improve flexibility, strength and mobility.  Demonstration, verbal and tactile cues throughout for technique. Review of hospital issued HEP with addition of: Supine R heel slide/knee flexion AAROM with strap assist x 10  GAIT TRAINING: To normalize gait pattern and improve safety with RW . Instruction in normal gait pattern with increased R hip and knee flexion and we will heel strike on weight acceptance with step through pattern.  Instruction in proper positioning and movement RW during turning.  MODALITIES: Game Ready vasopneumatic compression post session to R knee x 10 min with R LE elevated on wedge bolster, medium compression, 34 to reduce post-exercise pain and swelling/edema   PATIENT EDUCATION:  Education details: PT eval findings, anticipated POC, initial HEP, and HEP review  Person educated: Patient and Spouse Education method: Explanation, Demonstration, Tactile cues, Verbal cues, and Handouts Education comprehension: verbalized understanding, returned demonstration, verbal cues required, tactile cues required, and needs further education  HOME EXERCISE PROGRAM: Access Code: [email or web address] URL: [email or web address] Date: 09/21/2022 Prepared by: Glenetta Hew  Exercises - Supine Heel Slide with Strap  - 2 x daily - 7 x weekly - 2 sets - 10 reps - 3 sec hold   ASSESSMENT:  CLINICAL IMPRESSION: MINDA BAJAJ is a  72 y.o. female who was referred today for physical therapy evaluation and treatment s/p R unicondylar knee replacement on 09/18/2022.  She  arrives to PT using an RW with mildly antalgic gait pattern with decreased R hip and knee flexion, but at baseline does not typically use an AD.  Pain has been moderate since surgery and we reviewed proper use of ice for pain and edema control.  Current deficits include R knee pain, increased edema in R LE, limited and painful R knee ROM, R>L LE weakness and impaired ADLs, gait and mobility. Mirka will benefit from skilled PT to address above deficits to improve mobility and activity tolerance with decreased pain interference.   OBJECTIVE IMPAIRMENTS: Abnormal gait, decreased activity tolerance, decreased balance, decreased endurance, decreased knowledge of condition, decreased knowledge of use of DME, decreased mobility, difficulty walking, decreased ROM, decreased strength, decreased safety awareness, increased edema, increased fascial restrictions, impaired perceived functional ability, increased muscle spasms, impaired flexibility, and pain.   ACTIVITY LIMITATIONS: sitting, standing, squatting, sleeping, stairs, transfers, bed mobility, bathing, toileting, dressing, locomotion level, and caring for others  PARTICIPATION LIMITATIONS: meal prep, cleaning, laundry, driving, shopping, community activity, and yard work  PERSONAL FACTORS: Fitness, Past/current experiences, Time since onset of injury/illness/exacerbation, and 3+ comorbidities: OA, anxiety, HTN, Raynaud's phenomenon, GERD, B CTS, DVT s/p COVID in 2021, syncope  are also affecting patient's functional outcome.   REHAB POTENTIAL: Excellent  CLINICAL DECISION MAKING: Stable/uncomplicated  EVALUATION COMPLEXITY: Low   GOALS: Goals reviewed with patient? Yes  SHORT TERM GOALS: Target date: 10/12/2022  Patient will be independent with initial HEP. Baseline: pt performing hospital issued HEP Goal status: INITIAL  2.  Patient will demonstrate improved R knee AROM to >/= 5-90 deg to allow for normal gait pattern. Baseline: R knee AROM  -15-77, PROM -5-82 Goal status: INITIAL  LONG TERM GOALS: Target date: 11/02/2022  Patient will be independent with advanced/ongoing HEP to improve outcomes and carryover.  Baseline:  Goal status: INITIAL  2.  Patient will report at least 75% improvement in R knee pain to improve QOL. Baseline: 5-6/10 Goal status: INITIAL  3.  Patient will demonstrate improved R knee AROM to >/= -2-120 deg to allow for normal gait and stair mechanics. Baseline: R knee AROM -15-77, PROM -5-82 Goal status: INITIAL  4.  Patient will demonstrate improved B LE strength to >/= 4+/5 for improved stability and ease of mobility. Baseline: refer to above LE MMT table Goal status: INITIAL  5.  Patient will be able to ambulate 600' with or w/o LRAD and normal gait pattern without increased pain to access community.  Baseline: antalgic gait with RW Goal status: INITIAL  6. Patient will be able to ascend/descend stairs with 1 HR and reciprocal step pattern safely to access home and community.  Baseline: NT Goal status: INITIAL  7.  Patient will report >/= 30/80 on LEFS to demonstrate improved functional ability. Baseline: 13 / 80 = 16.3 % Goal status: INITIAL  8.  Patient will improve 5x STS time to </= 15 seconds with even weight shift to demonstrate improved functional strength and transfer efficiency . Baseline: 29.60 sec with weight shift to L LE Goal status: INITIAL   9.  Patient will demonstrate decreased TUG time to </= 13.5 sec with or w/o LRAD to decrease risk for falls with transitional mobility Baseline: 23.79 sec with RW Goal status: INITIAL   PLAN:  PT FREQUENCY: 2-3x/week (3x/wk x 2 wks, tapering to 2x/wk for duration of POC)  PT DURATION: 4-6 weeks  PLANNED INTERVENTIONS: Therapeutic exercises, Therapeutic activity, Neuromuscular re-education, Balance training, Gait training, Patient/Family education, Self Care, Joint mobilization, Stair training, DME instructions, Dry Needling,  Electrical stimulation, Cryotherapy, Moist heat, Taping, Vasopneumatic device, Ultrasound, Ionotophoresis 4mg /ml Dexamethasone, Manual therapy, and Re-evaluation  PLAN FOR NEXT SESSION: Assess L knee AROM; progress R knee ROM and B LE strengthening - gradually progress to standing activities; balance training; gait training to normalize gait pattern and work on weaning to [facility] as indicated; MT +/- modalities for R knee pain and R LE edema management   Marry Guan, PT 09/21/2022, 11:50 AM    Date of referral: 08/10/22 Referring provider: Joen Laura, MD Referring diagnosis? V78.469 (ICD-10-CM) - Status post right partial knee replacement  Treatment diagnosis? (if different than referring diagnosis)  Stiffness of right knee, not elsewhere classified  Acute pain of right knee  Other abnormalities of gait and mobility  Muscle weakness (generalized)  Localized edema  What was this (referring dx) caused by? Surgery (Type: R unicondylar knee replacement) and Arthritis  Nature of Condition: Initial Onset (within last 3 months)   Laterality: Rt  Current Functional Measure Score: LEFS 13 / 80 = 16.3 %  Objective measurements identify impairments when they are compared to normal values, the uninvolved extremity, and prior level of function.  [x]  Yes  []  No  Objective assessment of functional ability: Moderate functional limitations   Briefly describe symptoms: Current deficits include R knee pain, increased edema in R LE, limited and painful R knee ROM, R>L LE weakness and impaired ADLs, gait and mobility.  How did symptoms start: OA leading to R unicondylar knee replacement on 09/18/22  Average pain intensity:  Last 24 hours: 5-6/10  Past week: 5-6/10  How often does the pt experience symptoms? Constantly  How much have the symptoms interfered with usual daily activities? Quite a bit  How has condition changed since care began at this facility? NA - initial visit  In  general, how is the patients overall health? Very Good

## 2022-09-21 ENCOUNTER — Other Ambulatory Visit: Payer: Self-pay

## 2022-09-21 ENCOUNTER — Encounter: Payer: Self-pay | Admitting: Physical Therapy

## 2022-09-21 ENCOUNTER — Ambulatory Visit: Payer: Medicare Other | Attending: Orthopedic Surgery | Admitting: Physical Therapy

## 2022-09-21 DIAGNOSIS — M6281 Muscle weakness (generalized): Secondary | ICD-10-CM | POA: Diagnosis not present

## 2022-09-21 DIAGNOSIS — R600 Localized edema: Secondary | ICD-10-CM

## 2022-09-21 DIAGNOSIS — M25661 Stiffness of right knee, not elsewhere classified: Secondary | ICD-10-CM | POA: Diagnosis not present

## 2022-09-21 DIAGNOSIS — R2689 Other abnormalities of gait and mobility: Secondary | ICD-10-CM | POA: Diagnosis not present

## 2022-09-21 DIAGNOSIS — M25561 Pain in right knee: Secondary | ICD-10-CM | POA: Diagnosis not present

## 2022-09-25 ENCOUNTER — Ambulatory Visit: Payer: Medicare Other

## 2022-09-25 DIAGNOSIS — M25661 Stiffness of right knee, not elsewhere classified: Secondary | ICD-10-CM

## 2022-09-25 DIAGNOSIS — R2689 Other abnormalities of gait and mobility: Secondary | ICD-10-CM | POA: Diagnosis not present

## 2022-09-25 DIAGNOSIS — R600 Localized edema: Secondary | ICD-10-CM

## 2022-09-25 DIAGNOSIS — M25561 Pain in right knee: Secondary | ICD-10-CM | POA: Diagnosis not present

## 2022-09-25 DIAGNOSIS — M6281 Muscle weakness (generalized): Secondary | ICD-10-CM

## 2022-09-25 NOTE — Therapy (Signed)
OUTPATIENT PHYSICAL THERAPY LOWER EXTREMITY TREATMENT   Patient Name: Megan Crane MRN: [id number] DOB:18-Feb-1950, 72 y.o., female Today's Date: 09/25/2022   END OF SESSION:  PT End of Session - 09/25/22 1105     Visit Number 2    Date for PT Re-Evaluation 11/02/22    Authorization Type [employer] Medicare    PT Start Time 1018    PT Stop Time 1108    PT Time Calculation (min) 50 min    Activity Tolerance Patient tolerated treatment well    Behavior During Therapy WFL for tasks assessed/performed              Past Medical History:  Diagnosis Date   Anxiety disorder    hx vasovagal responses   Arthritis    Cancer (HCC)    Deep vein thrombosis (DVT) (HCC)    hx of in left left after covid in 2021   Headache(784.0)    History of kidney stones    Hydronephrosis, right    Hyperlipidemia    Hypertension    Nephrolithiasis    Pneumonia    Positive nasal culture for methicillin resistant Staphylococcus aureus    Right ureteral stone    Thrombophlebitis of leg, left, superficial 11/17/2020   Urinary incontinence    sees Dr. McDiarmid    Vasovagal syncope 08/02/2022   Past Surgical History:  Procedure Laterality Date   BLADDER REPAIR     COLONOSCOPY  12/28/2015   per Dr. Adela Lank, serrated polyps and diverticula, repeat in 3 yrs    CYSTOSCOPY  06/09/2011   Procedure: CYSTOSCOPY;  Surgeon: Martina Sinner, MD;  Location: [facility];  Service: Urology;  Laterality: N/A;   CYSTOSCOPY W/ URETERAL STENT PLACEMENT  02/03/2014   Procedure: CYSTOSCOPY WITH  RIGHT RETROGRADE PYELOGRAM/ RIGHT URETERAL STENT PLACEMENT;  Surgeon: Anner Crete, MD;  Location: [facility];  Service: Urology;;   CYSTOSCOPY WITH BIOPSY  02/03/2014   Procedure: CYSTOSCOPY WITH BIOPSY;  Surgeon: Anner Crete, MD;  Location: [facility];  Service: Urology;;   PARTIAL KNEE ARTHROPLASTY Right 09/18/2022   Procedure: UNICOMPARTMENTAL KNEE;  Surgeon: Joen Laura, MD;   Location: [facility];  Service: Orthopedics;  Laterality: Right;   PILONIDAL CYST EXCISION  1974   RECTOCELE REPAIR  06/09/2011   Procedure: POSTERIOR REPAIR (RECTOCELE);  Surgeon: Martina Sinner, MD;  Location: [facility];  Service: Urology;  Laterality: N/A;  Xenform graft 6x10   RIGHT URETEROSCOPIC STONE EXTRACTION / STENT PLACEMENT  03/13/2000   URETEROSCOPY Right 02/03/2014   Procedure: URETEROSCOPY WITH MANAGEMENT OF URETERAL STRICTURE;  Surgeon: Anner Crete, MD;  Location: [facility];  Service: Urology;  Laterality: Right;   VAGINAL HYSTERECTOMY  06/09/2011   Procedure: HYSTERECTOMY VAGINAL;  Surgeon: Mickel Baas, MD;  Location: [facility];  Service: Gynecology;  Laterality: N/A;   VAGINAL PROLAPSE REPAIR  06/09/2011   Procedure: VAGINAL VAULT SUSPENSION;  Surgeon: Martina Sinner, MD;  Location: [facility];  Service: Urology;  Laterality: N/A;   VEIN LIGATION AND STRIPPING     LEFT LEG   Patient Active Problem List   Diagnosis Date Noted   S/P right unicompartmental knee replacement 09/18/2022   IC (interstitial cystitis) 09/04/2022   Pelvic pressure in female 08/22/2022   Visit for pre-operative examination 08/07/2022   Vasovagal syncope 08/02/2022   Nasal dryness 06/30/2022   Precancerous skin lesion 06/30/2022   Bilateral carpal tunnel syndrome 03/02/2022   Swelling of both hands  03/02/2022   Atypical chest pain 03/02/2022   OAB (overactive bladder) 11/23/2021   Corn of foot 11/23/2021   Atrophic vaginitis 11/23/2021   COVID-19 virus infection 11/17/2020   Raynaud's phenomenon without gangrene 11/17/2020   Primary osteoarthritis involving multiple joints 11/24/2019   GERD (gastroesophageal reflux disease) 12/26/2016   Edema 05/29/2014   Nasal colonization with methicillin-resistant Staphylococcus aureus 01/22/2014   Varicose veins of bilateral lower extremities with other complications 12/11/2012   Allergic rhinitis 11/29/2009   MOTION SICKNESS 11/29/2009    Hyperlipidemia 08/31/2008   Anxiety state 08/31/2008   Primary hypertension 08/31/2008   PILONIDAL CYST 08/31/2008   HEADACHE 08/31/2008   URINARY INCONTINENCE 08/31/2008   NEPHROLITHIASIS, HX OF 08/31/2008   POSTNASAL DRIP SYNDROME 05/28/2006    PCP: Nelwyn Salisbury, MD   REFERRING PROVIDER: Joen Laura, MD   REFERRING DIAG: [id number] (ICD-10-CM) - Status post right partial knee replacement   THERAPY DIAG:  Stiffness of right knee, not elsewhere classified  Acute pain of right knee  Other abnormalities of gait and mobility  Muscle weakness (generalized)  Localized edema  RATIONALE FOR EVALUATION AND TREATMENT: Rehabilitation  ONSET DATE: 09/18/2022 - R unicompartmental knee replacement  NEXT MD VISIT: 10/06/2022   SUBJECTIVE:                                                                                                                                                                                                         SUBJECTIVE STATEMENT: Pt reports she is doing better today, still pain at times on both sides of knees  PAIN: Are you having pain? Yes: NPRS scale: 5-6/10 Pain location: R anterior lateral knee Pain description: sharp laterally, achy Aggravating factors: prolonged sitting and first getting up Relieving factors: walking  PERTINENT HISTORY:  OA, anxiety, HTN, Raynaud's phenomenon, GERD, B CTS, DVT s/p COVID in 2021, syncope  PRECAUTIONS: None  RED FLAGS: None  WEIGHT BEARING RESTRICTIONS: No  FALLS:  Has patient fallen in last 6 months? 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 Go back to work part-time."   OBJECTIVE: (objective measures completed at initial evaluation unless otherwise dated)  DIAGNOSTIC FINDINGS:  09/18/22 - DG R knee:  IMPRESSION: Medial compartment hemiarthroplasty without immediate postoperative complication.  PATIENT SURVEYS:  LEFS 13 / 80 = 16.3 %  COGNITION: Overall cognitive status: Within functional limits for tasks assessed    SENSATION: WFL  EDEMA:  Circumferential: 44 cm at joint line, 53 cm (5 cm above joint line)  MUSCLE LENGTH: Hamstrings: Mild/mod tight R>L ITB: Mild/mod tight R>L Piriformis: Mild tight R>L Hip flexors: Mild tight R>L Quads: Mod tight R>L Heelcord: NT  POSTURE:  No Significant postural limitations  PALPATION: R knee swollen and slightly warm to touch with mild erythema present.  Decreased R patellar mobility in all directions.  LOWER EXTREMITY ROM:  Active ROM Right eval Left eval  Knee flexion 77   Knee extension -15 LAQ    Passive ROM Right eval  Knee flexion 82  Knee extension -5   LOWER EXTREMITY MMT:  MMT Right eval Left   eval  Hip flexion 4+ 5  Hip extension 4 4  Hip abduction 4- 4  Hip adduction 4 4+  Hip internal rotation 4 4+  Hip external rotation 4 4  Knee flexion 4 5  Knee extension 4  5  Ankle dorsiflexion 4 4  Ankle plantarflexion    Ankle inversion    Ankle eversion     (Blank rows = not tested)  FUNCTIONAL TESTS:  5 times sit to stand: 29.60 sec with weight shift to L LE Timed up and go (TUG): 23.79 sec with RW  GAIT: Distance walked: Clinic distances Assistive device utilized: Environmental consultant - 2 wheeled Level of assistance: Modified independence Gait pattern: step through pattern and decreased hip/knee flexion- Right Comments: Ambulates with stiff R knee but good heel strike on weight acceptance   TODAY'S TREATMENT:  09/25/22 Nustep L3x71min Seated R heel slides x 10  Seated R LAQ x 10  Standing 4 way WS 2 x 10  each direction Standing heel raise x 10  Standing HS curls x10  Supine R heel slides with strap 2x10 CP to R knee for x 10 min  09/21/22 - Eval THERAPEUTIC EXERCISE: to improve flexibility, strength and mobility.  Demonstration, verbal and tactile cues throughout for technique. Review of hospital issued HEP with addition of: Supine R heel slide/knee flexion AAROM with strap assist x 10  GAIT TRAINING: To normalize gait pattern and improve safety with RW . Instruction in normal gait pattern with increased R hip and knee flexion and we will heel strike on weight acceptance with step through pattern.  Instruction in proper positioning and movement RW during turning.  MODALITIES: Game Ready vasopneumatic compression post session to R knee x 10 min with R LE elevated on wedge bolster, medium compression, 34 to reduce post-exercise pain and swelling/edema   PATIENT EDUCATION:  Education details: PT eval findings, anticipated POC, initial HEP, and HEP review  Person educated: Patient and Spouse Education method: Explanation, Demonstration, Tactile cues, Verbal cues, and Handouts Education comprehension: verbalized understanding, returned demonstration, verbal cues required, tactile cues required, and needs further education  HOME EXERCISE PROGRAM: Access Code: [email or web address] URL: [email or web address] Date: 09/21/2022 Prepared by: Glenetta Hew  Exercises - Supine Heel Slide with Strap  - 2 x daily - 7 x weekly - 2 sets - 10 reps - 3 sec hold   ASSESSMENT:  CLINICAL IMPRESSION: Advanced therex to improve R knee ROM and strength. Pt had many questions about recovery and HEP so we reviewed these things. Added more standing exercises as well but none added to HEP officially. Good demonstration of  exercises with min cuing needed. CP post session for pain and swelling post exercise. Zanyia will benefit from skilled PT to address above deficits to improve mobility and activity  tolerance with decreased pain interference.   OBJECTIVE IMPAIRMENTS: Abnormal gait, decreased activity tolerance, decreased balance, decreased endurance, decreased knowledge of condition, decreased knowledge of use of DME, decreased mobility, difficulty walking, decreased ROM, decreased strength, decreased safety awareness, increased edema, increased fascial restrictions, impaired perceived functional ability, increased muscle spasms, impaired flexibility, and pain.   ACTIVITY LIMITATIONS: sitting, standing, squatting, sleeping, stairs, transfers, bed mobility, bathing, toileting, dressing, locomotion level, and caring for others  PARTICIPATION LIMITATIONS: meal prep, cleaning, laundry, driving, shopping, community activity, and yard work  PERSONAL FACTORS: Fitness, Past/current experiences, Time since onset of injury/illness/exacerbation, and 3+ comorbidities: OA, anxiety, HTN, Raynaud's phenomenon, GERD, B CTS, DVT s/p COVID in 2021, syncope  are also affecting patient's functional outcome.   REHAB POTENTIAL: Excellent  CLINICAL DECISION MAKING: Stable/uncomplicated  EVALUATION COMPLEXITY: Low   GOALS: Goals reviewed with patient? Yes  SHORT TERM GOALS: Target date: 10/12/2022  Patient will be independent with initial HEP. Baseline: pt performing hospital issued HEP Goal status: INITIAL  2.  Patient will demonstrate improved R knee AROM to >/= 5-90 deg to allow for normal gait pattern. Baseline: R knee AROM -15-77, PROM -5-82 Goal status: INITIAL  LONG TERM GOALS: Target date: 11/02/2022  Patient will be independent with advanced/ongoing HEP to improve outcomes and carryover.  Baseline:  Goal status: INITIAL  2.  Patient will report at least 75% improvement in R knee pain to improve QOL. Baseline: 5-6/10 Goal status: INITIAL  3.  Patient will demonstrate improved R knee AROM to >/= -2-120 deg to allow for normal gait and stair mechanics. Baseline: R knee AROM -15-77, PROM  -5-82 Goal status: INITIAL  4.  Patient will demonstrate improved B LE strength to >/= 4+/5 for improved stability and ease of mobility. Baseline: refer to above LE MMT table Goal status: INITIAL  5.  Patient will be able to ambulate 600' with or w/o LRAD and normal gait pattern without increased pain to access community.  Baseline: antalgic gait with RW Goal status: INITIAL  6. Patient will be able to ascend/descend stairs with 1 HR and reciprocal step pattern safely to access home and community.  Baseline: NT Goal status: INITIAL  7.  Patient will report >/= 30/80 on LEFS to demonstrate improved functional ability. Baseline: 13 / 80 = 16.3 % Goal status: INITIAL  8.  Patient will improve 5x STS time to </= 15 seconds with even weight shift to demonstrate improved functional strength and transfer efficiency . Baseline: 29.60 sec with weight shift to L LE Goal status: INITIAL   9.  Patient will demonstrate decreased TUG time to </= 13.5 sec with or w/o LRAD to decrease risk for falls with transitional mobility Baseline: 23.79 sec with RW Goal status: INITIAL   PLAN:  PT FREQUENCY: 2-3x/week (3x/wk x 2 wks, tapering to 2x/wk for duration of POC)  PT DURATION: 4-6 weeks  PLANNED INTERVENTIONS: Therapeutic exercises, Therapeutic activity, Neuromuscular re-education, Balance training, Gait training, Patient/Family education, Self Care, Joint mobilization, Stair training, DME instructions, Dry Needling, Electrical stimulation, Cryotherapy, Moist heat, Taping, Vasopneumatic device, Ultrasound, Ionotophoresis 4mg /ml Dexamethasone, Manual therapy, and Re-evaluation  PLAN FOR NEXT SESSION: Assess L knee AROM; progress R knee ROM and B LE strengthening - gradually progress to standing activities; balance training; gait training to normalize gait pattern and work on weaning to  SPC as indicated; MT +/- modalities for R knee pain and R LE edema management   Darleene Cleaver, PTA 09/25/2022,  11:06 AM    Date of referral: 08/10/22 Referring provider: Joen Laura, MD Referring diagnosis? A21.308 (ICD-10-CM) - Status post right partial knee replacement  Treatment diagnosis? (if different than referring diagnosis)  Stiffness of right knee, not elsewhere classified  Acute pain of right knee  Other abnormalities of gait and mobility  Muscle weakness (generalized)  Localized edema  What was this (referring dx) caused by? Surgery (Type: R unicondylar knee replacement) and Arthritis  Nature of Condition: Initial Onset (within last 3 months)   Laterality: Rt  Current Functional Measure Score: LEFS 13 / 80 = 16.3 %  Objective measurements identify impairments when they are compared to normal values, the uninvolved extremity, and prior level of function.  [x]  Yes  []  No  Objective assessment of functional ability: Moderate functional limitations   Briefly describe symptoms: Current deficits include R knee pain, increased edema in R LE, limited and painful R knee ROM, R>L LE weakness and impaired ADLs, gait and mobility.  How did symptoms start: OA leading to R unicondylar knee replacement on 09/18/22  Average pain intensity:  Last 24 hours: 5-6/10  Past week: 5-6/10  How often does the pt experience symptoms? Constantly  How much have the symptoms interfered with usual daily activities? Quite a bit  How has condition changed since care began at this facility? NA - initial visit  In general, how is the patients overall health? Very Good

## 2022-09-27 ENCOUNTER — Ambulatory Visit: Payer: Medicare Other | Admitting: Physical Therapy

## 2022-09-27 ENCOUNTER — Encounter: Payer: Self-pay | Admitting: Physical Therapy

## 2022-09-27 DIAGNOSIS — R2689 Other abnormalities of gait and mobility: Secondary | ICD-10-CM | POA: Diagnosis not present

## 2022-09-27 DIAGNOSIS — R600 Localized edema: Secondary | ICD-10-CM

## 2022-09-27 DIAGNOSIS — M25561 Pain in right knee: Secondary | ICD-10-CM | POA: Diagnosis not present

## 2022-09-27 DIAGNOSIS — M25661 Stiffness of right knee, not elsewhere classified: Secondary | ICD-10-CM | POA: Diagnosis not present

## 2022-09-27 DIAGNOSIS — M6281 Muscle weakness (generalized): Secondary | ICD-10-CM

## 2022-09-27 NOTE — Therapy (Signed)
OUTPATIENT PHYSICAL THERAPY LOWER EXTREMITY TREATMENT   Patient Name: Megan Crane MRN: [id number] DOB:08/15/1950, 72 y.o., female Today's Date: 09/27/2022   END OF SESSION:  PT End of Session - 09/27/22 1017     Visit Number 3    Date for PT Re-Evaluation 11/02/22    Authorization Type [employer] Medicare    PT Start Time 1017    PT Stop Time 1112    PT Time Calculation (min) 55 min    Activity Tolerance Patient tolerated treatment well    Behavior During Therapy WFL for tasks assessed/performed               Past Medical History:  Diagnosis Date   Anxiety disorder    hx vasovagal responses   Arthritis    Cancer (HCC)    Deep vein thrombosis (DVT) (HCC)    hx of in left left after covid in 2021   Headache(784.0)    History of kidney stones    Hydronephrosis, right    Hyperlipidemia    Hypertension    Nephrolithiasis    Pneumonia    Positive nasal culture for methicillin resistant Staphylococcus aureus    Right ureteral stone    Thrombophlebitis of leg, left, superficial 11/17/2020   Urinary incontinence    sees Dr. McDiarmid    Vasovagal syncope 08/02/2022   Past Surgical History:  Procedure Laterality Date   BLADDER REPAIR     COLONOSCOPY  12/28/2015   per Dr. Adela Lank, serrated polyps and diverticula, repeat in 3 yrs    CYSTOSCOPY  06/09/2011   Procedure: CYSTOSCOPY;  Surgeon: Martina Sinner, MD;  Location: [facility];  Service: Urology;  Laterality: N/A;   CYSTOSCOPY W/ URETERAL STENT PLACEMENT  02/03/2014   Procedure: CYSTOSCOPY WITH  RIGHT RETROGRADE PYELOGRAM/ RIGHT URETERAL STENT PLACEMENT;  Surgeon: Anner Crete, MD;  Location: [facility];  Service: Urology;;   CYSTOSCOPY WITH BIOPSY  02/03/2014   Procedure: CYSTOSCOPY WITH BIOPSY;  Surgeon: Anner Crete, MD;  Location: [facility];  Service: Urology;;   PARTIAL KNEE ARTHROPLASTY Right 09/18/2022   Procedure: UNICOMPARTMENTAL KNEE;  Surgeon: Joen Laura, MD;   Location: [facility];  Service: Orthopedics;  Laterality: Right;   PILONIDAL CYST EXCISION  1974   RECTOCELE REPAIR  06/09/2011   Procedure: POSTERIOR REPAIR (RECTOCELE);  Surgeon: Martina Sinner, MD;  Location: [facility];  Service: Urology;  Laterality: N/A;  Xenform graft 6x10   RIGHT URETEROSCOPIC STONE EXTRACTION / STENT PLACEMENT  03/13/2000   URETEROSCOPY Right 02/03/2014   Procedure: URETEROSCOPY WITH MANAGEMENT OF URETERAL STRICTURE;  Surgeon: Anner Crete, MD;  Location: [facility];  Service: Urology;  Laterality: Right;   VAGINAL HYSTERECTOMY  06/09/2011   Procedure: HYSTERECTOMY VAGINAL;  Surgeon: Mickel Baas, MD;  Location: [facility];  Service: Gynecology;  Laterality: N/A;   VAGINAL PROLAPSE REPAIR  06/09/2011   Procedure: VAGINAL VAULT SUSPENSION;  Surgeon: Martina Sinner, MD;  Location: [facility];  Service: Urology;  Laterality: N/A;   VEIN LIGATION AND STRIPPING     LEFT LEG   Patient Active Problem List   Diagnosis Date Noted   S/P right unicompartmental knee replacement 09/18/2022   IC (interstitial cystitis) 09/04/2022   Pelvic pressure in female 08/22/2022   Visit for pre-operative examination 08/07/2022   Vasovagal syncope 08/02/2022   Nasal dryness 06/30/2022   Precancerous skin lesion 06/30/2022   Bilateral carpal tunnel syndrome 03/02/2022   Swelling of both  hands 03/02/2022   Atypical chest pain 03/02/2022   OAB (overactive bladder) 11/23/2021   Corn of foot 11/23/2021   Atrophic vaginitis 11/23/2021   COVID-19 virus infection 11/17/2020   Raynaud's phenomenon without gangrene 11/17/2020   Primary osteoarthritis involving multiple joints 11/24/2019   GERD (gastroesophageal reflux disease) 12/26/2016   Edema 05/29/2014   Nasal colonization with methicillin-resistant Staphylococcus aureus 01/22/2014   Varicose veins of bilateral lower extremities with other complications 12/11/2012   Allergic rhinitis 11/29/2009   MOTION SICKNESS 11/29/2009    Hyperlipidemia 08/31/2008   Anxiety state 08/31/2008   Primary hypertension 08/31/2008   PILONIDAL CYST 08/31/2008   HEADACHE 08/31/2008   URINARY INCONTINENCE 08/31/2008   NEPHROLITHIASIS, HX OF 08/31/2008   POSTNASAL DRIP SYNDROME 05/28/2006    PCP: Nelwyn Salisbury, MD   REFERRING PROVIDER: Joen Laura, MD   REFERRING DIAG: [phone] (ICD-10-CM) - Status post right partial knee replacement   THERAPY DIAG:  Stiffness of right knee, not elsewhere classified  Acute pain of right knee  Other abnormalities of gait and mobility  Muscle weakness (generalized)  Localized edema  RATIONALE FOR EVALUATION AND TREATMENT: Rehabilitation  ONSET DATE: 09/18/2022 - R unicompartmental knee replacement  NEXT MD VISIT: 10/06/2022   SUBJECTIVE:                                                                                                                                                                                                         SUBJECTIVE STATEMENT: Pt reports minimal pain at rest or with walking but notes more pain during sit to/from stand transitions. She requested clarification of positioning for her knee during the day.  PAIN: Are you having pain? Yes: NPRS scale: 1/10 at rest or with walking, with STS transition 7/10 Pain location: R anterior knee Pain description: sharp laterally, achy Aggravating factors: prolonged sitting and first getting up Relieving factors: walking  PERTINENT HISTORY:  OA, anxiety, HTN, Raynaud's phenomenon, GERD, B CTS, DVT s/p COVID in 2021, syncope  PRECAUTIONS: None  RED FLAGS: None  WEIGHT BEARING RESTRICTIONS: No  FALLS:  Has patient fallen in last 6 months? 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 Go back to work  part-time."   OBJECTIVE: (objective measures completed at initial evaluation unless otherwise dated)  DIAGNOSTIC FINDINGS:  09/18/22 - DG R knee:  IMPRESSION: Medial compartment hemiarthroplasty without immediate postoperative complication.  PATIENT SURVEYS:  LEFS 13 / 80 = 16.3 %  COGNITION: Overall cognitive status: Within functional limits for tasks assessed    SENSATION: WFL  EDEMA:  Circumferential: 44 cm at joint line, 53 cm (5 cm above joint line)  MUSCLE LENGTH: Hamstrings: Mild/mod tight R>L ITB: Mild/mod tight R>L Piriformis: Mild tight R>L Hip flexors: Mild tight R>L Quads: Mod tight R>L Heelcord: NT  POSTURE:  No Significant postural limitations  PALPATION: R knee swollen and slightly warm to touch with mild erythema present.  Decreased R patellar mobility in all directions.  LOWER EXTREMITY ROM:  Active ROM Right eval Left eval R 09/27/22  Knee flexion 77 127 85  Knee extension -15 LAQ +2 -10 LAQ   Passive ROM Right eval R 09/27/22  Knee flexion 82 85  Knee extension -5 0   LOWER EXTREMITY MMT:  MMT Right eval Left   eval  Hip flexion 4+ 5  Hip extension 4 4  Hip abduction 4- 4  Hip adduction 4 4+  Hip internal rotation 4 4+  Hip external rotation 4 4  Knee flexion 4 5  Knee extension 4  5  Ankle dorsiflexion 4 4  Ankle plantarflexion    Ankle inversion    Ankle eversion     (Blank rows = not tested)  FUNCTIONAL TESTS:  5 times sit to stand: 29.60 sec with weight shift to L LE Timed up and go (TUG): 23.79 sec with RW  GAIT: Distance walked: Clinic distances Assistive device utilized: Environmental consultant - 2 wheeled Level of assistance: Modified independence Gait pattern: step through pattern and decreased hip/knee flexion- Right Comments: Ambulates with  stiff R knee but good heel strike on weight acceptance   TODAY'S TREATMENT:   09/27/22 THERAPEUTIC EXERCISE: to improve flexibility, strength and mobility.  Demonstration, verbal and tactile cues throughout for technique. NuStep - L4 x 7 min Standing heel/toe raises 2 x 10 Standing alt hip abduction x 10 bil, UE support on counter - cues to avoid rotating foot outward Standing alt hip extension x 10 bil, UE support on counter Standing alt hip flexion march x 10 bil, UE support on counter Supine B HS curls with heels on peanut ball + strap assist for knee flexion AAROM x 20  MANUAL THERAPY: To promote improved flexibility, improved joint mobility, increased ROM, and reduced pain.  R Patellar mobs - all directions R knee ROM  MODALITIES: Game Ready vasopneumatic compression post session to R knee x 10 min, medium compression, 34 to reduce post-exercise pain and swelling/edema   09/25/22 Nustep L3x29min Seated R heel slides x 10  Seated R LAQ x 10  Standing 4 way WS 2 x 10 each direction Standing heel raise x 10  Standing HS curls x10  Supine R heel slides with strap 2x10 CP to R knee for x 10 min    09/21/22 - Eval THERAPEUTIC EXERCISE: to improve flexibility, strength and mobility.  Demonstration, verbal and tactile cues throughout for technique. Review of hospital issued HEP with addition of: Supine R heel slide/knee flexion AAROM with strap assist x 10  GAIT TRAINING: To normalize gait pattern and improve safety with RW . Instruction in normal gait pattern with increased R hip and knee flexion and we will heel strike on weight acceptance with step through pattern.  Instruction in proper positioning and movement RW during turning.  MODALITIES: Game Ready vasopneumatic compression post session to R knee x 10 min with R LE elevated on wedge bolster, medium compression, 34 to reduce post-exercise pain and swelling/edema   PATIENT EDUCATION:  Education details: HEP review, HEP  progression - standing exercises, review of proper positioning for R knee with sitting/sleeping to avoid knee flexion contracture, and review of recommended activity t/o the day   Person educated: Patient and Spouse Education method: Explanation, Demonstration, Tactile cues, Verbal cues, and Handouts Education comprehension: verbalized understanding, returned demonstration, verbal cues required, tactile cues required, and needs further education  HOME EXERCISE PROGRAM: Access Code: [email or web address] URL: [email or web address] Date: 09/27/2022 Prepared by: Glenetta Hew  Exercises - Supine Heel Slide with Strap  - 2 x daily - 7 x weekly - 2 sets - 10 reps - 3 sec hold - Heel Toe Raises with Counter Support  - 2 x daily - 7 x weekly - 2 sets - 10 reps - 3 sec hold - Standing Knee Flexion  - 2 x daily - 7 x weekly - 2 sets - 10 reps - 3 sec hold - Standing Hip Abduction with Counter Support  - 2 x daily - 7 x weekly - 2 sets - 10 reps - 2-3 sec hold - Standing Hip Extension with Counter Support  - 2 x daily - 7 x weekly - 2 sets - 10 reps - 2-3 sec hold - Standing March with Counter Support  - 2 x daily - 7 x weekly - 2 sets - 10 reps - 2-3 sec hold   ASSESSMENT:  CLINICAL IMPRESSION: Colette reports her pain seems to be subsiding but still notes the most pain during sit to/from stand transitions. Progressed standing exercises to help improve weight shifting and weightbearing tolerance on R LE. Updated HEP handouts provided at pt request, but pt cautioned that not all activities/exercises performed during therapy sessions will need to be duplicated at home. R knee ROM improving with pt able to achieve full extension with LE supported and only -10 lacking in LAQ as well as flexion increased to 85 today. Session concluded with GR vasopneumatic compression to reduce post-exercise pain and swelling/edema. Answered all of pt's and her husband's questions regarding positioning and activity at  home. Tobitha will benefit from continued skilled PT to address ongoing pain, ROM and strength deficits to improve mobility and activity tolerance with decreased pain interference.   OBJECTIVE IMPAIRMENTS: Abnormal gait, decreased activity tolerance, decreased balance, decreased endurance, decreased knowledge of condition, decreased knowledge of use of DME, decreased mobility, difficulty walking, decreased ROM, decreased strength, decreased safety awareness, increased edema, increased fascial restrictions, impaired perceived functional ability, increased muscle spasms, impaired flexibility, and pain.   ACTIVITY LIMITATIONS: sitting, standing, squatting, sleeping, stairs, transfers, bed mobility, bathing, toileting, dressing, locomotion level, and caring for others  PARTICIPATION LIMITATIONS: meal prep, cleaning, laundry, driving, shopping, community activity, and yard work  PERSONAL FACTORS: Fitness, Past/current experiences, Time since onset of injury/illness/exacerbation, and 3+ comorbidities: OA, anxiety, HTN, Raynaud's phenomenon, GERD, B CTS, DVT s/p COVID in 2021, syncope  are also affecting patient's functional outcome.   REHAB POTENTIAL: Excellent  CLINICAL DECISION MAKING: Stable/uncomplicated  EVALUATION COMPLEXITY: Low   GOALS: Goals reviewed with patient? Yes  SHORT TERM GOALS: Target date: 10/12/2022  Patient will be independent with initial HEP. Baseline: pt performing hospital issued HEP Goal status: IN PROGRESS  2.  Patient will demonstrate improved R knee AROM to >/=  5-90 deg to allow for normal gait pattern. Baseline: R knee AROM -15-77, PROM -5-82 Goal status: IN PROGRESS  09/27/22 - R knee AROM -10-85, PROM 0-85  LONG TERM GOALS: Target date: 11/02/2022  Patient will be independent with advanced/ongoing HEP to improve outcomes and carryover.  Baseline:  Goal status: IN PROGRESS  2.  Patient will report at least 75% improvement in R knee pain to improve  QOL. Baseline: 5-6/10 Goal status: IN PROGRESS  3.  Patient will demonstrate improved R knee AROM to >/= -2-120 deg to allow for normal gait and stair mechanics. Baseline: R knee AROM -15-77, PROM -5-82 Goal status: IN PROGRESS  4.  Patient will demonstrate improved B LE strength to >/= 4+/5 for improved stability and ease of mobility. Baseline: refer to above LE MMT table Goal status: IN PROGRESS  5.  Patient will be able to ambulate 600' with or w/o LRAD and normal gait pattern without increased pain to access community.  Baseline: antalgic gait with RW Goal status: IN PROGRESS  6. Patient will be able to ascend/descend stairs with 1 HR and reciprocal step pattern safely to access home and community.  Baseline: NT Goal status: IN PROGRESS  7.  Patient will report >/= 30/80 on LEFS to demonstrate improved functional ability. Baseline: 13 / 80 = 16.3 % Goal status: IN PROGRESS  8.  Patient will improve 5x STS time to </= 15 seconds with even weight shift to demonstrate improved functional strength and transfer efficiency . Baseline: 29.60 sec with weight shift to L LE Goal status: IN PROGRESS   9.  Patient will demonstrate decreased TUG time to </= 13.5 sec with or w/o LRAD to decrease risk for falls with transitional mobility Baseline: 23.79 sec with RW Goal status: IN PROGRESS   PLAN:  PT FREQUENCY: 2-3x/week (3x/wk x 2 wks, tapering to 2x/wk for duration of POC)  PT DURATION: 4-6 weeks  PLANNED INTERVENTIONS: Therapeutic exercises, Therapeutic activity, Neuromuscular re-education, Balance training, Gait training, Patient/Family education, Self Care, Joint mobilization, Stair training, DME instructions, Dry Needling, Electrical stimulation, Cryotherapy, Moist heat, Taping, Vasopneumatic device, Ultrasound, Ionotophoresis 4mg /ml Dexamethasone, Manual therapy, and Re-evaluation  PLAN FOR NEXT SESSION: Assess L knee AROM; progress R knee ROM and B LE strengthening -  gradually progress to standing activities; balance training; gait training to normalize gait pattern and work on weaning to [facility] as indicated; MT +/- modalities for R knee pain and R LE edema management   Marry Guan, PT 09/27/2022, 12:48 PM    Date of referral: 08/10/22 Referring provider: Joen Laura, MD Referring diagnosis? L24.401 (ICD-10-CM) - Status post right partial knee replacement  Treatment diagnosis? (if different than referring diagnosis)  Stiffness of right knee, not elsewhere classified  Acute pain of right knee  Other abnormalities of gait and mobility  Muscle weakness (generalized)  Localized edema  What was this (referring dx) caused by? Surgery (Type: R unicondylar knee replacement) and Arthritis  Nature of Condition: Initial Onset (within last 3 months)   Laterality: Rt  Current Functional Measure Score: LEFS 13 / 80 = 16.3 %  Objective measurements identify impairments when they are compared to normal values, the uninvolved extremity, and prior level of function.  [x]  Yes  []  No  Objective assessment of functional ability: Moderate functional limitations   Briefly describe symptoms: Current deficits include R knee pain, increased edema in R LE, limited and painful R knee ROM, R>L LE weakness and impaired ADLs, gait and mobility.  How did symptoms start: OA leading to R unicondylar knee replacement on 09/18/22  Average pain intensity:  Last 24 hours: 5-6/10  Past week: 5-6/10  How often does the pt experience symptoms? Constantly  How much have the symptoms interfered with usual daily activities? Quite a bit  How has condition changed since care began at this facility? NA - initial visit  In general, how is the patients overall health? Very Good

## 2022-09-29 ENCOUNTER — Ambulatory Visit: Payer: Medicare Other

## 2022-09-29 DIAGNOSIS — M25561 Pain in right knee: Secondary | ICD-10-CM | POA: Diagnosis not present

## 2022-09-29 DIAGNOSIS — M6281 Muscle weakness (generalized): Secondary | ICD-10-CM | POA: Diagnosis not present

## 2022-09-29 DIAGNOSIS — M25661 Stiffness of right knee, not elsewhere classified: Secondary | ICD-10-CM | POA: Diagnosis not present

## 2022-09-29 DIAGNOSIS — R2689 Other abnormalities of gait and mobility: Secondary | ICD-10-CM

## 2022-09-29 DIAGNOSIS — R600 Localized edema: Secondary | ICD-10-CM

## 2022-09-29 NOTE — Therapy (Signed)
OUTPATIENT PHYSICAL THERAPY LOWER EXTREMITY TREATMENT   Patient Name: Megan Crane MRN: [id number] DOB:December 21, 1950, 72 y.o., female Today's Date: 09/29/2022   END OF SESSION:  PT End of Session - 09/29/22 1128     Visit Number 4    Date for PT Re-Evaluation 11/02/22    Authorization Type [employer]    Authorization Time Period 09/21/22-11/02/22    Authorization - Visit Number 4    Authorization - Number of Visits 16    PT Start Time 1019    PT Stop Time 1118    PT Time Calculation (min) 59 min    Activity Tolerance Patient tolerated treatment well    Behavior During Therapy WFL for tasks assessed/performed                Past Medical History:  Diagnosis Date   Anxiety disorder    hx vasovagal responses   Arthritis    Cancer (HCC)    Deep vein thrombosis (DVT) (HCC)    hx of in left left after covid in 2021   Headache(784.0)    History of kidney stones    Hydronephrosis, right    Hyperlipidemia    Hypertension    Nephrolithiasis    Pneumonia    Positive nasal culture for methicillin resistant Staphylococcus aureus    Right ureteral stone    Thrombophlebitis of leg, left, superficial 11/17/2020   Urinary incontinence    sees Dr. McDiarmid    Vasovagal syncope 08/02/2022   Past Surgical History:  Procedure Laterality Date   BLADDER REPAIR     COLONOSCOPY  12/28/2015   per Dr. Adela Lank, serrated polyps and diverticula, repeat in 3 yrs    CYSTOSCOPY  06/09/2011   Procedure: CYSTOSCOPY;  Surgeon: Martina Sinner, MD;  Location: [facility];  Service: Urology;  Laterality: N/A;   CYSTOSCOPY W/ URETERAL STENT PLACEMENT  02/03/2014   Procedure: CYSTOSCOPY WITH  RIGHT RETROGRADE PYELOGRAM/ RIGHT URETERAL STENT PLACEMENT;  Surgeon: Anner Crete, MD;  Location: [facility];  Service: Urology;;   CYSTOSCOPY WITH BIOPSY  02/03/2014   Procedure: CYSTOSCOPY WITH BIOPSY;  Surgeon: Anner Crete, MD;  Location: [facility]  Service:  Urology;;   PARTIAL KNEE ARTHROPLASTY Right 09/18/2022   Procedure: UNICOMPARTMENTAL KNEE;  Surgeon: Joen Laura, MD;  Location: [facility];  Service: Orthopedics;  Laterality: Right;   PILONIDAL CYST EXCISION  1974   RECTOCELE REPAIR  06/09/2011   Procedure: POSTERIOR REPAIR (RECTOCELE);  Surgeon: Martina Sinner, MD;  Location: [facility];  Service: Urology;  Laterality: N/A;  Xenform graft 6x10   RIGHT URETEROSCOPIC STONE EXTRACTION / STENT PLACEMENT  03/13/2000   URETEROSCOPY Right 02/03/2014   Procedure: URETEROSCOPY WITH MANAGEMENT OF URETERAL STRICTURE;  Surgeon: Anner Crete, MD;  Location: [facility];  Service: Urology;  Laterality: Right;   VAGINAL HYSTERECTOMY  06/09/2011   Procedure: HYSTERECTOMY VAGINAL;  Surgeon: Mickel Baas, MD;  Location: [facility];  Service: Gynecology;  Laterality: N/A;   VAGINAL PROLAPSE REPAIR  06/09/2011   Procedure: VAGINAL VAULT SUSPENSION;  Surgeon: Martina Sinner, MD;  Location: [facility];  Service: Urology;  Laterality: N/A;   VEIN LIGATION AND STRIPPING     LEFT LEG   Patient Active Problem List   Diagnosis Date Noted   S/P right unicompartmental knee replacement 09/18/2022   IC (interstitial cystitis) 09/04/2022   Pelvic pressure in female 08/22/2022   Visit for pre-operative examination 08/07/2022   Vasovagal  syncope 08/02/2022   Nasal dryness 06/30/2022   Precancerous skin lesion 06/30/2022   Bilateral carpal tunnel syndrome 03/02/2022   Swelling of both hands 03/02/2022   Atypical chest pain 03/02/2022   OAB (overactive bladder) 11/23/2021   Corn of foot 11/23/2021   Atrophic vaginitis 11/23/2021   COVID-19 virus infection 11/17/2020   Raynaud's phenomenon without gangrene 11/17/2020   Primary osteoarthritis involving multiple joints 11/24/2019   GERD (gastroesophageal reflux disease) 12/26/2016   Edema 05/29/2014   Nasal colonization with methicillin-resistant Staphylococcus aureus 01/22/2014   Varicose veins  of bilateral lower extremities with other complications 12/11/2012   Allergic rhinitis 11/29/2009   MOTION SICKNESS 11/29/2009   Hyperlipidemia 08/31/2008   Anxiety state 08/31/2008   Primary hypertension 08/31/2008   PILONIDAL CYST 08/31/2008   HEADACHE 08/31/2008   URINARY INCONTINENCE 08/31/2008   NEPHROLITHIASIS, HX OF 08/31/2008   POSTNASAL DRIP SYNDROME 05/28/2006    PCP: Nelwyn Salisbury, MD   REFERRING PROVIDER: Joen Laura, MD   REFERRING DIAG: [phone] (ICD-10-CM) - Status post right partial knee replacement   THERAPY DIAG:  Stiffness of right knee, not elsewhere classified  Acute pain of right knee  Other abnormalities of gait and mobility  Muscle weakness (generalized)  Localized edema  RATIONALE FOR EVALUATION AND TREATMENT: Rehabilitation  ONSET DATE: 09/18/2022 - R unicompartmental knee replacement  NEXT MD VISIT: 10/03/2022   SUBJECTIVE:                                                                                                                                                                                                         SUBJECTIVE STATEMENT: Pt reports some aching in her knee. Made a pivot step on RLE yesterday that caused a pain in her knee but afterward it felt fine.  PAIN: Are you having pain? Yes: NPRS scale: 3/10 Pain location: R anterior knee Pain description: sharp laterally, achy Aggravating factors: prolonged sitting and first getting up Relieving factors: walking  PERTINENT HISTORY:  OA, anxiety, HTN, Raynaud's phenomenon, GERD, B CTS, DVT s/p COVID in 2021, syncope  PRECAUTIONS: None  RED FLAGS: None  WEIGHT BEARING RESTRICTIONS: No  FALLS:  Has patient fallen in last 6 months? 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 Go back to work part-time."   OBJECTIVE: (objective measures completed at initial evaluation unless otherwise dated)  DIAGNOSTIC FINDINGS:  09/18/22 - DG R knee:  IMPRESSION: Medial compartment hemiarthroplasty without immediate postoperative complication.  PATIENT SURVEYS:  LEFS 13 / 80 = 16.3 %  COGNITION: Overall cognitive status: Within functional limits for tasks assessed    SENSATION: WFL  EDEMA:  Circumferential: 44 cm at joint line, 53 cm (5 cm above joint line)  MUSCLE LENGTH: Hamstrings: Mild/mod tight R>L ITB: Mild/mod tight R>L Piriformis: Mild tight R>L Hip flexors: Mild tight R>L Quads: Mod tight R>L Heelcord: NT  POSTURE:  No Significant postural limitations  PALPATION: R knee swollen and slightly warm to touch with mild erythema present.  Decreased R patellar mobility in all directions.  LOWER EXTREMITY ROM:  Active ROM Right eval Left eval R 09/27/22 R 09/29/22  Knee flexion 77 127 85 91  Knee extension -15 LAQ +2 -10 LAQ 9- LAQ   Passive ROM Right eval R 09/27/22  Knee flexion 82 85  Knee extension -5 0   LOWER EXTREMITY MMT:  MMT Right eval Left   eval  Hip flexion 4+ 5  Hip extension 4 4  Hip abduction 4- 4  Hip adduction 4 4+  Hip internal rotation 4 4+  Hip external rotation 4 4  Knee flexion 4 5  Knee extension 4  5  Ankle dorsiflexion 4 4  Ankle plantarflexion    Ankle inversion    Ankle eversion     (Blank rows = not tested)  FUNCTIONAL TESTS:  5 times sit to stand: 29.60 sec with weight shift to L LE Timed up and go (TUG): 23.79 sec with RW  GAIT: Distance walked: Clinic distances Assistive device utilized: Environmental consultant - 2 wheeled Level of assistance: Modified independence Gait pattern: step through pattern and  decreased hip/knee flexion- Right Comments: Ambulates with stiff R knee but good heel strike on weight acceptance   TODAY'S TREATMENT:  09/29/22 THERAPEUTIC EXERCISE: to improve flexibility, strength and mobility.  Demonstration, verbal and tactile cues throughout for technique. NuStep - L4 x 7 min Standing heel/toe raise 2x10 Retro step x 12 RLE back/LLE front Church pew x 12  Standing marching x 10 BLE Standing alt hip extension 2 x 10 bil, UE support on counter Standing alt hip abduction 2 x 10 bil, UE support on counter Supine R SLR x 10 Supine heel slides with peanut ball x 10   MANUAL THERAPY: To promote improved flexibility, improved joint mobility, increased ROM, and reduced pain.  R Patellar mobs - all directions  MODALITIES: Ice pack to R knee for x 10 min with wedge under BLE   09/27/22 THERAPEUTIC EXERCISE: to improve flexibility, strength and mobility.  Demonstration, verbal and tactile cues throughout for technique. NuStep - L4 x 7 min Standing heel/toe raises 2 x 10 Standing alt hip abduction x 10 bil, UE support on counter - cues to avoid rotating foot outward Standing alt hip extension x 10 bil, UE support on counter Standing alt hip flexion march x 10 bil, UE support on counter Supine B HS curls with heels on peanut ball + strap assist for knee flexion AAROM x 20  MANUAL THERAPY: To promote improved flexibility, improved joint mobility, increased ROM, and reduced pain.  R Patellar mobs - all directions R knee ROM  MODALITIES: Game Ready vasopneumatic compression post session to R knee x 10 min, medium compression,  34 to reduce post-exercise pain and swelling/edema   09/25/22 Nustep L3x23min Seated R heel slides x 10  Seated R LAQ x 10  Standing 4 way WS 2 x 10 each direction Standing heel raise x 10  Standing HS curls x10  Supine R heel slides with strap 2x10 CP to R knee for x 10 min    09/21/22 - Eval THERAPEUTIC EXERCISE: to improve flexibility,  strength and mobility.  Demonstration, verbal and tactile cues throughout for technique. Review of hospital issued HEP with addition of: Supine R heel slide/knee flexion AAROM with strap assist x 10  GAIT TRAINING: To normalize gait pattern and improve safety with RW . Instruction in normal gait pattern with increased R hip and knee flexion and we will heel strike on weight acceptance with step through pattern.  Instruction in proper positioning and movement RW during turning.  MODALITIES: Game Ready vasopneumatic compression post session to R knee x 10 min with R LE elevated on wedge bolster, medium compression, 34 to reduce post-exercise pain and swelling/edema   PATIENT EDUCATION:  Education details: HEP update - retro step and church pew   Person educated: Patient and Spouse Education method: Explanation, Demonstration, Actor cues, Verbal cues, and Handouts Education comprehension: verbalized understanding, returned demonstration, verbal cues required, tactile cues required, and needs further education  HOME EXERCISE PROGRAM: Access Code: [email or web address] URL: [email or web address] Date: 09/29/2022 Prepared by: Verta Ellen  Exercises - Supine Heel Slide with Strap  - 2 x daily - 7 x weekly - 2 sets - 10 reps - 3 sec hold - Heel Toe Raises with Counter Support  - 2 x daily - 7 x weekly - 2 sets - 10 reps - 3 sec hold - Standing Knee Flexion  - 2 x daily - 7 x weekly - 2 sets - 10 reps - 3 sec hold - Standing Hip Abduction with Counter Support  - 2 x daily - 7 x weekly - 2 sets - 10 reps - 2-3 sec hold - Standing Hip Extension with Counter Support  - 2 x daily - 7 x weekly - 2 sets - 10 reps - 2-3 sec hold - Standing March with Counter Support  - 2 x daily - 7 x weekly - 2 sets - 10 reps - 2-3 sec hold - Backward Weight Shift and Opposite Arm Raise with Walker  - 1 x daily - 7 x weekly - 2 sets - 10 reps - Church Pew  - 1 x daily - 7 x weekly - 2 sets - 10  reps   ASSESSMENT:  CLINICAL IMPRESSION: Advanced and progressed therapeutic exercises focusing on BLE strength to enhance recovery post partial R TKA. ROM is improving thus far. Progressing with standing tolerance as well with minimal pain. Continues to have many questions in regard to recovery but all concerns seem consistent this type of surgery. Ice pack applied post session to address edema and pain. Niayla will benefit from continued skilled PT to address ongoing pain, ROM and strength deficits to improve mobility and activity tolerance with decreased pain interference.   OBJECTIVE IMPAIRMENTS: Abnormal gait, decreased activity tolerance, decreased balance, decreased endurance, decreased knowledge of condition, decreased knowledge of use of DME, decreased mobility, difficulty walking, decreased ROM, decreased strength, decreased safety awareness, increased edema, increased fascial restrictions, impaired perceived functional ability, increased muscle spasms, impaired flexibility, and pain.   ACTIVITY LIMITATIONS: sitting, standing, squatting, sleeping, stairs, transfers, bed mobility, bathing, toileting, dressing, locomotion level, and caring for others  PARTICIPATION LIMITATIONS: meal prep, cleaning, laundry, driving, shopping, community activity, and yard work  PERSONAL FACTORS: Fitness, Past/current experiences, Time since onset of injury/illness/exacerbation, and 3+ comorbidities: OA, anxiety, HTN, Raynaud's phenomenon, GERD, B CTS, DVT s/p COVID in 2021, syncope  are also affecting patient's functional outcome.   REHAB POTENTIAL: Excellent  CLINICAL DECISION MAKING: Stable/uncomplicated  EVALUATION COMPLEXITY: Low   GOALS: Goals reviewed with patient? Yes  SHORT TERM GOALS: Target date: 10/12/2022  Patient will be independent with initial HEP. Baseline: pt performing hospital issued HEP Goal status: IN PROGRESS  2.  Patient will demonstrate improved R knee AROM to >/= 5-90  deg to allow for normal gait pattern. Baseline: R knee AROM -15-77, PROM -5-82 Goal status: IN PROGRESS  09/27/22 - R knee AROM -10-85, PROM 0-85  LONG TERM GOALS: Target date: 11/02/2022  Patient will be independent with advanced/ongoing HEP to improve outcomes and carryover.  Baseline:  Goal status: IN PROGRESS  2.  Patient will report at least 75% improvement in R knee pain to improve QOL. Baseline: 5-6/10 Goal status: IN PROGRESS  3.  Patient will demonstrate improved R knee AROM to >/= -2-120 deg to allow for normal gait and stair mechanics. Baseline: R knee AROM -15-77, PROM -5-82 Goal status: IN PROGRESS  4.  Patient will demonstrate improved B LE strength to >/= 4+/5 for improved stability and ease of mobility. Baseline: refer to above LE MMT table Goal status: IN PROGRESS  5.  Patient will be able to ambulate 600' with or w/o LRAD and normal gait pattern without increased pain to access community.  Baseline: antalgic gait with RW Goal status: IN PROGRESS  6. Patient will be able to ascend/descend stairs with 1 HR and reciprocal step pattern safely to access home and community.  Baseline: NT Goal status: IN PROGRESS  7.  Patient will report >/= 30/80 on LEFS to demonstrate improved functional ability. Baseline: 13 / 80 = 16.3 % Goal status: IN PROGRESS  8.  Patient will improve 5x STS time to </= 15 seconds with even weight shift to demonstrate improved functional strength and transfer efficiency . Baseline: 29.60 sec with weight shift to L LE Goal status: IN PROGRESS   9.  Patient will demonstrate decreased TUG time to </= 13.5 sec with or w/o LRAD to decrease risk for falls with transitional mobility Baseline: 23.79 sec with RW Goal status: IN PROGRESS   PLAN:  PT FREQUENCY: 2-3x/week (3x/wk x 2 wks, tapering to 2x/wk for duration of POC)  PT DURATION: 4-6 weeks  PLANNED INTERVENTIONS: Therapeutic exercises, Therapeutic activity, Neuromuscular  re-education, Balance training, Gait training, Patient/Family education, Self Care, Joint mobilization, Stair training, DME instructions, Dry Needling, Electrical stimulation, Cryotherapy, Moist heat, Taping, Vasopneumatic device, Ultrasound, Ionotophoresis 4mg /ml Dexamethasone, Manual therapy, and Re-evaluation  PLAN FOR NEXT SESSION: progress R knee ROM and B LE strengthening - gradually progress to standing activities; balance training; gait training to normalize gait pattern and work on weaning to [facility] as indicated; MT +/- modalities for R knee pain and R LE edema management   Darleene Cleaver, PTA 09/29/2022, 11:51 AM    Date of referral: 08/10/22 Referring provider: Joen Laura, MD Referring diagnosis? M57.846 (ICD-10-CM) - Status post right partial knee replacement  Treatment diagnosis? (if different than referring diagnosis)  Stiffness of right knee, not elsewhere classified  Acute pain of right knee  Other abnormalities of gait and mobility  Muscle weakness (generalized)  Localized edema  What was this (referring dx)  caused by? Surgery (Type: R unicondylar knee replacement) and Arthritis  Nature of Condition: Initial Onset (within last 3 months)   Laterality: Rt  Current Functional Measure Score: LEFS 13 / 80 = 16.3 %  Objective measurements identify impairments when they are compared to normal values, the uninvolved extremity, and prior level of function.  [x]  Yes  []  No  Objective assessment of functional ability: Moderate functional limitations   Briefly describe symptoms: Current deficits include R knee pain, increased edema in R LE, limited and painful R knee ROM, R>L LE weakness and impaired ADLs, gait and mobility.  How did symptoms start: OA leading to R unicondylar knee replacement on 09/18/22  Average pain intensity:  Last 24 hours: 5-6/10  Past week: 5-6/10  How often does the pt experience symptoms? Constantly  How much have the symptoms  interfered with usual daily activities? Quite a bit  How has condition changed since care began at this facility? NA - initial visit  In general, how is the patients overall health? Very Good

## 2022-10-02 ENCOUNTER — Ambulatory Visit: Payer: Medicare Other | Admitting: Physical Therapy

## 2022-10-02 ENCOUNTER — Encounter: Payer: Self-pay | Admitting: Physical Therapy

## 2022-10-02 DIAGNOSIS — M25661 Stiffness of right knee, not elsewhere classified: Secondary | ICD-10-CM | POA: Diagnosis not present

## 2022-10-02 DIAGNOSIS — M6281 Muscle weakness (generalized): Secondary | ICD-10-CM | POA: Diagnosis not present

## 2022-10-02 DIAGNOSIS — M25561 Pain in right knee: Secondary | ICD-10-CM

## 2022-10-02 DIAGNOSIS — R2689 Other abnormalities of gait and mobility: Secondary | ICD-10-CM

## 2022-10-02 DIAGNOSIS — R600 Localized edema: Secondary | ICD-10-CM

## 2022-10-02 NOTE — Therapy (Addendum)
OUTPATIENT PHYSICAL THERAPY TREATMENT  Progress Note  Reporting Period 09/21/2022 to 10/02/2022  See note below for Objective Data and Assessment of Progress/Goals.     Patient Name: Megan Crane MRN: [id number] DOB:08-28-1950, 72 y.o., female Today's Date: 10/02/2022   END OF SESSION:  PT End of Session - 10/02/22 1016     Visit Number 5    Date for PT Re-Evaluation 11/02/22    Authorization Type [employer]    Authorization Time Period 09/21/22-11/02/22    Authorization - Visit Number 5    Authorization - Number of Visits 16    Progress Note Due on Visit 15   MD PN on visit #5 - 10/02/22   PT Start Time 1016    PT Stop Time 1113    PT Time Calculation (min) 57 min    Activity Tolerance Patient tolerated treatment well    Behavior During Therapy WFL for tasks assessed/performed                 Past Medical History:  Diagnosis Date   Anxiety disorder    hx vasovagal responses   Arthritis    Cancer (HCC)    Deep vein thrombosis (DVT) (HCC)    hx of in left left after covid in 2021   Headache(784.0)    History of kidney stones    Hydronephrosis, right    Hyperlipidemia    Hypertension    Nephrolithiasis    Pneumonia    Positive nasal culture for methicillin resistant Staphylococcus aureus    Right ureteral stone    Thrombophlebitis of leg, left, superficial 11/17/2020   Urinary incontinence    sees Dr. McDiarmid    Vasovagal syncope 08/02/2022   Past Surgical History:  Procedure Laterality Date   BLADDER REPAIR     COLONOSCOPY  12/28/2015   per Dr. Adela Lank, serrated polyps and diverticula, repeat in 3 yrs    CYSTOSCOPY  06/09/2011   Procedure: CYSTOSCOPY;  Surgeon: Martina Sinner, MD;  Location: [facility];  Service: Urology;  Laterality: N/A;   CYSTOSCOPY W/ URETERAL STENT PLACEMENT  02/03/2014   Procedure: CYSTOSCOPY WITH  RIGHT RETROGRADE PYELOGRAM/ RIGHT URETERAL STENT PLACEMENT;  Surgeon: Anner Crete, MD;  Location: [facility];  Service: Urology;;   CYSTOSCOPY WITH BIOPSY  02/03/2014   Procedure: CYSTOSCOPY WITH BIOPSY;  Surgeon: Anner Crete, MD;  Location: [facility];  Service: Urology;;   PARTIAL KNEE ARTHROPLASTY Right 09/18/2022   Procedure: UNICOMPARTMENTAL KNEE;  Surgeon: Joen Laura, MD;  Location: [facility];  Service: Orthopedics;  Laterality: Right;   PILONIDAL CYST EXCISION  1974   RECTOCELE REPAIR  06/09/2011   Procedure: POSTERIOR REPAIR (RECTOCELE);  Surgeon: Martina Sinner, MD;  Location: [facility];  Service: Urology;  Laterality: N/A;  Xenform graft 6x10   RIGHT URETEROSCOPIC STONE EXTRACTION / STENT PLACEMENT  03/13/2000   URETEROSCOPY Right 02/03/2014   Procedure: URETEROSCOPY WITH MANAGEMENT OF URETERAL STRICTURE;  Surgeon: Anner Crete, MD;  Location: [facility];  Service: Urology;  Laterality: Right;   VAGINAL HYSTERECTOMY  06/09/2011   Procedure: HYSTERECTOMY VAGINAL;  Surgeon: Mickel Baas, MD;  Location: [facility];  Service: Gynecology;  Laterality: N/A;   VAGINAL PROLAPSE REPAIR  06/09/2011   Procedure: VAGINAL VAULT SUSPENSION;  Surgeon: Martina Sinner, MD;  Location: [facility];  Service: Urology;  Laterality: N/A;   VEIN LIGATION AND STRIPPING     LEFT LEG   Patient Active  Problem List   Diagnosis Date Noted   S/P right unicompartmental knee replacement 09/18/2022   IC (interstitial cystitis) 09/04/2022   Pelvic pressure in female 08/22/2022   Visit for pre-operative examination 08/07/2022   Vasovagal syncope 08/02/2022   Nasal dryness 06/30/2022   Precancerous skin lesion 06/30/2022   Bilateral carpal tunnel syndrome 03/02/2022   Swelling of both hands 03/02/2022   Atypical chest pain 03/02/2022   OAB (overactive bladder) 11/23/2021   Corn of foot 11/23/2021   Atrophic vaginitis 11/23/2021   COVID-19 virus infection 11/17/2020   Raynaud's phenomenon without gangrene 11/17/2020   Primary osteoarthritis involving multiple joints  11/24/2019   GERD (gastroesophageal reflux disease) 12/26/2016   Edema 05/29/2014   Nasal colonization with methicillin-resistant Staphylococcus aureus 01/22/2014   Varicose veins of bilateral lower extremities with other complications 12/11/2012   Allergic rhinitis 11/29/2009   MOTION SICKNESS 11/29/2009   Hyperlipidemia 08/31/2008   Anxiety state 08/31/2008   Primary hypertension 08/31/2008   PILONIDAL CYST 08/31/2008   HEADACHE 08/31/2008   URINARY INCONTINENCE 08/31/2008   NEPHROLITHIASIS, HX OF 08/31/2008   POSTNASAL DRIP SYNDROME 05/28/2006    PCP: Nelwyn Salisbury, MD   REFERRING PROVIDER: Joen Laura, MD   REFERRING DIAG: [id number] (ICD-10-CM) - Status post right partial knee replacement   THERAPY DIAG:  Stiffness of right knee, not elsewhere classified  Acute pain of right knee  Other abnormalities of gait and mobility  Muscle weakness (generalized)  Localized edema  RATIONALE FOR EVALUATION AND TREATMENT: Rehabilitation  ONSET DATE: 09/18/2022 - R unicompartmental knee replacement  NEXT MD VISIT: 10/03/2022   SUBJECTIVE:                                                                                                                                                                                                         SUBJECTIVE STATEMENT: Pt reports some aching in her knee. Made a pivot step on RLE yesterday that caused a pain in her knee but afterward it felt fine.  PAIN: Are you having pain? Yes: NPRS scale: 3/10 Pain location: R posterior knee Pain description: sore Aggravating factors: prolonged sitting and first getting up Relieving factors: walking  PERTINENT HISTORY:  OA, anxiety, HTN, Raynaud's phenomenon, GERD, B CTS, DVT s/p COVID in 2021, syncope  PRECAUTIONS: None  RED FLAGS: None  WEIGHT BEARING RESTRICTIONS: No  FALLS:  Has patient fallen in last 6 months? 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 Go back to work part-time."   OBJECTIVE: (objective measures completed at initial evaluation unless otherwise dated)  DIAGNOSTIC FINDINGS:  09/18/22 - DG R knee:  IMPRESSION: Medial compartment hemiarthroplasty without immediate postoperative complication.  PATIENT SURVEYS:  LEFS 13 / 80 = 16.3 %  COGNITION: Overall cognitive status: Within functional limits for tasks assessed    SENSATION: WFL  EDEMA:  Circumferential: 44 cm at joint line, 53 cm (5 cm above joint line)  MUSCLE LENGTH: Hamstrings: Mild/mod tight R>L ITB: Mild/mod tight R>L Piriformis: Mild tight R>L Hip flexors: Mild tight R>L Quads: Mod tight R>L Heelcord: NT  POSTURE:  No Significant postural limitations  PALPATION: R knee swollen and slightly warm to touch with mild erythema present.  Decreased R patellar mobility in all directions.  LOWER EXTREMITY ROM:  Active ROM Right eval Left eval R 09/27/22 R 09/29/22 R 10/02/22  Knee flexion 77 127 85 91 100  Knee extension -15 LAQ +2 -10 LAQ -9 LAQ -8 LAQ   Passive ROM Right eval R 09/27/22 R 10/02/22  Knee flexion 82 85   Knee extension -5 0 -1   LOWER EXTREMITY MMT:  MMT Right eval Left   eval  Hip flexion 4+ 5  Hip extension 4 4  Hip abduction 4- 4  Hip adduction 4 4+  Hip internal rotation 4 4+  Hip external rotation 4 4  Knee flexion 4 5  Knee extension 4  5  Ankle dorsiflexion 4 4  Ankle plantarflexion    Ankle inversion    Ankle eversion     (Blank rows = not tested)  FUNCTIONAL TESTS:  5 times sit to stand: 29.60 sec with weight shift to L LE Timed up and go (TUG):  23.79 sec with RW  GAIT: Distance walked: Clinic distances Assistive device utilized: Environmental consultant - 2 wheeled Level of assistance: Modified independence Gait pattern: step through pattern and decreased hip/knee flexion- Right Comments: Ambulates with stiff R knee but good heel strike on weight acceptance   TODAY'S TREATMENT:   10/02/22 THERAPEUTIC EXERCISE: to improve flexibility, strength and mobility.  Demonstration, verbal and tactile cues throughout for technique. Rec Bike - partial revolutions x 6 min Counter mini-squat 2 x 10 Standing heel/toe raise 2 x 10 Standing TKE with small ball against counter 10 x 3-5", 2 sets Seated heel slide with AAROM knee flexion L LE over R LE 10 x 3" Hip ADD ball squeeze + R LAQ 10 x 3"  GAIT TRAINING: To normalize gait pattern, improve safety with SPC, and prepare to wean to LRAD. 360 ft with [facility] - cues for proper sequencing of cane and symmetrical step length  THERAPEUTIC ACTIVITIES: R knee ROM assessment  MODALITIES: Game Ready vasopneumatic compression post session to R knee x 10 min, medium compression, 34 to reduce post-exercise pain and swelling/edema   09/29/22 THERAPEUTIC EXERCISE: to improve flexibility, strength and mobility.  Demonstration, verbal and tactile cues throughout for technique. NuStep - L4 x 7 min Standing heel/toe raise 2x10 Retro step x 12 RLE back/LLE front Church pew x 12  Standing marching x 10 BLE Standing alt hip extension 2 x 10 bil, UE support on counter Standing alt hip abduction 2 x 10 bil, UE support on  counter Supine R SLR x 10 Supine heel slides with peanut ball x 10   MANUAL THERAPY: To promote improved flexibility, improved joint mobility, increased ROM, and reduced pain.  R Patellar mobs - all directions  MODALITIES: Ice pack to R knee for x 10 min with wedge under BLE    09/27/22 THERAPEUTIC EXERCISE: to improve flexibility, strength and mobility.  Demonstration, verbal and tactile cues  throughout for technique. NuStep - L4 x 7 min Standing heel/toe raises 2 x 10 Standing alt hip abduction x 10 bil, UE support on counter - cues to avoid rotating foot outward Standing alt hip extension x 10 bil, UE support on counter Standing alt hip flexion march x 10 bil, UE support on counter Supine B HS curls with heels on peanut ball + strap assist for knee flexion AAROM x 20  MANUAL THERAPY: To promote improved flexibility, improved joint mobility, increased ROM, and reduced pain.  R Patellar mobs - all directions R knee ROM  MODALITIES: Game Ready vasopneumatic compression post session to R knee x 10 min, medium compression, 34 to reduce post-exercise pain and swelling/edema   09/25/22 Nustep L3x81min Seated R heel slides x 10  Seated R LAQ x 10  Standing 4 way WS 2 x 10 each direction Standing heel raise x 10  Standing HS curls x10  Supine R heel slides with strap 2x10 CP to R knee for x 10 min    09/21/22 - Eval THERAPEUTIC EXERCISE: to improve flexibility, strength and mobility.  Demonstration, verbal and tactile cues throughout for technique. Review of hospital issued HEP with addition of: Supine R heel slide/knee flexion AAROM with strap assist x 10  GAIT TRAINING: To normalize gait pattern and improve safety with RW . Instruction in normal gait pattern with increased R hip and knee flexion and we will heel strike on weight acceptance with step through pattern.  Instruction in proper positioning and movement RW during turning.  MODALITIES: Game Ready vasopneumatic compression post session to R knee x 10 min with R LE elevated on wedge bolster, medium compression, 34 to reduce post-exercise pain and swelling/edema   PATIENT EDUCATION:  Education details: HEP update - retro step and church pew   Person educated: Patient and Spouse Education method: Explanation, Demonstration, Actor cues, Verbal cues, and Handouts Education comprehension: verbalized understanding,  returned demonstration, verbal cues required, tactile cues required, and needs further education  HOME EXERCISE PROGRAM: Access Code: [email or web address] URL: [email or web address] Date: 09/29/2022 Prepared by: Verta Ellen  Exercises - Supine Heel Slide with Strap  - 2 x daily - 7 x weekly - 2 sets - 10 reps - 3 sec hold - Heel Toe Raises with Counter Support  - 2 x daily - 7 x weekly - 2 sets - 10 reps - 3 sec hold - Standing Knee Flexion  - 2 x daily - 7 x weekly - 2 sets - 10 reps - 3 sec hold - Standing Hip Abduction with Counter Support  - 2 x daily - 7 x weekly - 2 sets - 10 reps - 2-3 sec hold - Standing Hip Extension with Counter Support  - 2 x daily - 7 x weekly - 2 sets - 10 reps - 2-3 sec hold - Standing March with Counter Support  - 2 x daily - 7 x weekly - 2 sets - 10 reps - 2-3 sec hold - Backward Weight Shift and Opposite Arm Raise with Walker  - 1 x daily - 7 x  weekly - 2 sets - 10 reps - Church Pew  - 1 x daily - 7 x weekly - 2 sets - 10 reps   ASSESSMENT:  CLINICAL IMPRESSION: Megan Crane is progressing well with PT s/p R unicompartmental knee replacement on 09/18/22. Her current AROM -1-100 with 8 extension lacking in LAQ. We have initiated weaning from the RW to a [facility] and she is able to demonstrate proper sequencing with cane after practice during therapy session.  She is tolerating exercise progression well. She continues to have may questions/concerns regarding postioning and movement of her R knee as well as overall recovery process, with PT staff continuing to confirm and reassure as needed. Megan Crane is progressing well toward her goals and will benefit from continued skilled PT to address ongoing pain, ROM and strength deficits to improve mobility and activity tolerance with decreased pain interference.   OBJECTIVE IMPAIRMENTS: Abnormal gait, decreased activity tolerance, decreased balance, decreased endurance, decreased knowledge of condition, decreased knowledge of  use of DME, decreased mobility, difficulty walking, decreased ROM, decreased strength, decreased safety awareness, increased edema, increased fascial restrictions, impaired perceived functional ability, increased muscle spasms, impaired flexibility, and pain.   ACTIVITY LIMITATIONS: sitting, standing, squatting, sleeping, stairs, transfers, bed mobility, bathing, toileting, dressing, locomotion level, and caring for others  PARTICIPATION LIMITATIONS: meal prep, cleaning, laundry, driving, shopping, community activity, and yard work  PERSONAL FACTORS: Fitness, Past/current experiences, Time since onset of injury/illness/exacerbation, and 3+ comorbidities: OA, anxiety, HTN, Raynaud's phenomenon, GERD, B CTS, DVT s/p COVID in 2021, syncope  are also affecting patient's functional outcome.   REHAB POTENTIAL: Excellent  CLINICAL DECISION MAKING: Stable/uncomplicated  EVALUATION COMPLEXITY: Low   GOALS: Goals reviewed with patient? Yes  SHORT TERM GOALS: Target date: 10/12/2022  Patient will be independent with initial HEP. Baseline: pt performing hospital issued HEP Goal status: MET  10/02/22  2.  Patient will demonstrate improved R knee AROM to >/= 5-90 deg to allow for normal gait pattern. Baseline: R knee AROM -15-77, PROM -5-82 Goal status: MET  10/02/22 - R knee AROM -1-100, lacking 8 extension in LAQ  LONG TERM GOALS: Target date: 11/02/2022  Patient will be independent with advanced/ongoing HEP to improve outcomes and carryover.  Baseline:  Goal status: IN PROGRESS  2.  Patient will report at least 75% improvement in R knee pain to improve QOL. Baseline: 5-6/10 Goal status: IN PROGRESS  3.  Patient will demonstrate improved R knee AROM to >/= -2-120 deg to allow for normal gait and stair mechanics. Baseline: R knee AROM -15-77, PROM -5-82 Goal status: IN PROGRESS  4.  Patient will demonstrate improved B LE strength to >/= 4+/5 for improved stability and ease of  mobility. Baseline: refer to above LE MMT table Goal status: IN PROGRESS  5.  Patient will be able to ambulate 600' with or w/o LRAD and normal gait pattern without increased pain to access community.  Baseline: antalgic gait with RW Goal status: IN PROGRESS  6. Patient will be able to ascend/descend stairs with 1 HR and reciprocal step pattern safely to access home and community.  Baseline: NT Goal status: IN PROGRESS  7.  Patient will report >/= 30/80 on LEFS to demonstrate improved functional ability. Baseline: 13 / 80 = 16.3 % Goal status: IN PROGRESS  8.  Patient will improve 5x STS time to </= 15 seconds with even weight shift to demonstrate improved functional strength and transfer efficiency . Baseline: 29.60 sec with weight shift to L LE Goal status:  IN PROGRESS   9.  Patient will demonstrate decreased TUG time to </= 13.5 sec with or w/o LRAD to decrease risk for falls with transitional mobility Baseline: 23.79 sec with RW Goal status: IN PROGRESS   PLAN:  PT FREQUENCY: 2-3x/week (3x/wk x 2 wks, tapering to 2x/wk for duration of POC)  PT DURATION: 4-6 weeks  PLANNED INTERVENTIONS: Therapeutic exercises, Therapeutic activity, Neuromuscular re-education, Balance training, Gait training, Patient/Family education, Self Care, Joint mobilization, Stair training, DME instructions, Dry Needling, Electrical stimulation, Cryotherapy, Moist heat, Taping, Vasopneumatic device, Ultrasound, Ionotophoresis 4mg /ml Dexamethasone, Manual therapy, and Re-evaluation  PLAN FOR NEXT SESSION: progress R knee ROM and B LE strengthening - gradually progress to standing activities; review & update HEP PRN; balance training; gait training to normalize gait pattern and work on weaning to [facility] as indicated; MT +/- modalities for R knee pain and R LE edema management   Marry Guan, PT 10/02/2022, 1:03 PM    Date of referral: 08/10/22 Referring provider: Joen Laura, MD Referring  diagnosis? Z61.096 (ICD-10-CM) - Status post right partial knee replacement  Treatment diagnosis? (if different than referring diagnosis)  Stiffness of right knee, not elsewhere classified  Acute pain of right knee  Other abnormalities of gait and mobility  Muscle weakness (generalized)  Localized edema  What was this (referring dx) caused by? Surgery (Type: R unicondylar knee replacement) and Arthritis  Nature of Condition: Initial Onset (within last 3 months)   Laterality: Rt  Current Functional Measure Score: LEFS 13 / 80 = 16.3 %  Objective measurements identify impairments when they are compared to normal values, the uninvolved extremity, and prior level of function.  [x]  Yes  []  No  Objective assessment of functional ability: Moderate functional limitations   Briefly describe symptoms: Current deficits include R knee pain, increased edema in R LE, limited and painful R knee ROM, R>L LE weakness and impaired ADLs, gait and mobility.  How did symptoms start: OA leading to R unicondylar knee replacement on 09/18/22  Average pain intensity:  Last 24 hours: 5-6/10  Past week: 5-6/10  How often does the pt experience symptoms? Constantly  How much have the symptoms interfered with usual daily activities? Quite a bit  How has condition changed since care began at this facility? NA - initial visit  In general, how is the patients overall health? Very Good

## 2022-10-03 DIAGNOSIS — M1711 Unilateral primary osteoarthritis, right knee: Secondary | ICD-10-CM | POA: Diagnosis not present

## 2022-10-04 ENCOUNTER — Ambulatory Visit: Payer: Medicare Other

## 2022-10-04 DIAGNOSIS — M25661 Stiffness of right knee, not elsewhere classified: Secondary | ICD-10-CM | POA: Diagnosis not present

## 2022-10-04 DIAGNOSIS — R2689 Other abnormalities of gait and mobility: Secondary | ICD-10-CM | POA: Diagnosis not present

## 2022-10-04 DIAGNOSIS — M25561 Pain in right knee: Secondary | ICD-10-CM | POA: Diagnosis not present

## 2022-10-04 DIAGNOSIS — M6281 Muscle weakness (generalized): Secondary | ICD-10-CM | POA: Diagnosis not present

## 2022-10-04 DIAGNOSIS — R600 Localized edema: Secondary | ICD-10-CM

## 2022-10-04 NOTE — Therapy (Signed)
OUTPATIENT PHYSICAL THERAPY TREATMENT      Patient Name: Megan Crane MRN: [id number] DOB:[birth year], 72 y.o., female Today's Date: 10/04/2022   END OF SESSION:  PT End of Session - 10/04/22 1027     Visit Number 6    Date for PT Re-Evaluation 11/02/22    Authorization Type [employer]    Authorization Time Period 09/21/22-11/02/22    Authorization - Visit Number 6    Authorization - Number of Visits 16    Progress Note Due on Visit 15   MD PN on visit #5 - 10/02/22   PT Start Time 1017    PT Stop Time 1110    PT Time Calculation (min) 53 min    Activity Tolerance Patient tolerated treatment well    Behavior During Therapy WFL for tasks assessed/performed                  Past Medical History:  Diagnosis Date   Anxiety disorder    hx vasovagal responses   Arthritis    Cancer (HCC)    Deep vein thrombosis (DVT) (HCC)    hx of in left left after covid in 2021   Headache(784.0)    History of kidney stones    Hydronephrosis, right    Hyperlipidemia    Hypertension    Nephrolithiasis    Pneumonia    Positive nasal culture for methicillin resistant Staphylococcus aureus    Right ureteral stone    Thrombophlebitis of leg, left, superficial 11/17/2020   Urinary incontinence    sees Dr. McDiarmid    Vasovagal syncope 08/02/2022   Past Surgical History:  Procedure Laterality Date   BLADDER REPAIR     COLONOSCOPY  12/28/2015   per Dr. Adela Lank, serrated polyps and diverticula, repeat in 3 yrs    CYSTOSCOPY  06/09/2011   Procedure: CYSTOSCOPY;  Surgeon: Martina Sinner, MD;  Location: [facility];  Service: Urology;  Laterality: N/A;   CYSTOSCOPY W/ URETERAL STENT PLACEMENT  02/03/2014   Procedure: CYSTOSCOPY WITH  RIGHT RETROGRADE PYELOGRAM/ RIGHT URETERAL STENT PLACEMENT;  Surgeon: Anner Crete, MD;  Location: [facility];  Service: Urology;;   CYSTOSCOPY WITH BIOPSY  02/03/2014   Procedure: CYSTOSCOPY WITH BIOPSY;  Surgeon: Anner Crete,  MD;  Location: [facility];  Service: Urology;;   PARTIAL KNEE ARTHROPLASTY Right 09/18/2022   Procedure: UNICOMPARTMENTAL KNEE;  Surgeon: Joen Laura, MD;  Location: [facility];  Service: Orthopedics;  Laterality: Right;   PILONIDAL CYST EXCISION  1974   RECTOCELE REPAIR  06/09/2011   Procedure: POSTERIOR REPAIR (RECTOCELE);  Surgeon: Martina Sinner, MD;  Location: [facility];  Service: Urology;  Laterality: N/A;  Xenform graft 6x10   RIGHT URETEROSCOPIC STONE EXTRACTION / STENT PLACEMENT  03/13/2000   URETEROSCOPY Right 02/03/2014   Procedure: URETEROSCOPY WITH MANAGEMENT OF URETERAL STRICTURE;  Surgeon: Anner Crete, MD;  Location: [facility];  Service: Urology;  Laterality: Right;   VAGINAL HYSTERECTOMY  06/09/2011   Procedure: HYSTERECTOMY VAGINAL;  Surgeon: Mickel Baas, MD;  Location: [facility];  Service: Gynecology;  Laterality: N/A;   VAGINAL PROLAPSE REPAIR  06/09/2011   Procedure: VAGINAL VAULT SUSPENSION;  Surgeon: Martina Sinner, MD;  Location: [facility];  Service: Urology;  Laterality: N/A;   VEIN LIGATION AND STRIPPING     LEFT LEG   Patient Active Problem List   Diagnosis Date Noted   S/P right unicompartmental knee replacement 09/18/2022   IC (  interstitial cystitis) 09/04/2022   Pelvic pressure in female 08/22/2022   Visit for pre-operative examination 08/07/2022   Vasovagal syncope 08/02/2022   Nasal dryness 06/30/2022   Precancerous skin lesion 06/30/2022   Bilateral carpal tunnel syndrome 03/02/2022   Swelling of both hands 03/02/2022   Atypical chest pain 03/02/2022   OAB (overactive bladder) 11/23/2021   Corn of foot 11/23/2021   Atrophic vaginitis 11/23/2021   COVID-19 virus infection 11/17/2020   Raynaud's phenomenon without gangrene 11/17/2020   Primary osteoarthritis involving multiple joints 11/24/2019   GERD (gastroesophageal reflux disease) 12/26/2016   Edema 05/29/2014   Nasal colonization with methicillin-resistant  Staphylococcus aureus 01/22/2014   Varicose veins of bilateral lower extremities with other complications 12/11/2012   Allergic rhinitis 11/29/2009   MOTION SICKNESS 11/29/2009   Hyperlipidemia 08/31/2008   Anxiety state 08/31/2008   Primary hypertension 08/31/2008   PILONIDAL CYST 08/31/2008   HEADACHE 08/31/2008   URINARY INCONTINENCE 08/31/2008   NEPHROLITHIASIS, HX OF 08/31/2008   POSTNASAL DRIP SYNDROME 05/28/2006    PCP: Nelwyn Salisbury, MD   REFERRING PROVIDER: Joen Laura, MD   REFERRING DIAG: [phone] (ICD-10-CM) - Status post right partial knee replacement   THERAPY DIAG:  Stiffness of right knee, not elsewhere classified  Acute pain of right knee  Other abnormalities of gait and mobility  Muscle weakness (generalized)  Localized edema  RATIONALE FOR EVALUATION AND TREATMENT: Rehabilitation  ONSET DATE: 09/18/2022 - R unicompartmental knee replacement  NEXT MD VISIT: 10/03/2022   SUBJECTIVE:                                                                                                                                                                                                         SUBJECTIVE STATEMENT: Pt reports she is now able to sleep on her L side.   PAIN: Are you having pain? Yes: NPRS scale: 2-/10 Pain location: R anterior lower leg Pain description: sore Aggravating factors: prolonged sitting and first getting up Relieving factors: walking  PERTINENT HISTORY:  OA, anxiety, HTN, Raynaud's phenomenon, GERD, B CTS, DVT s/p COVID in 2021, syncope  PRECAUTIONS: None  RED FLAGS: None  WEIGHT BEARING RESTRICTIONS: No  FALLS:  Has patient fallen in last 6 months? No  LIVING ENVIRONMENT: Lives with: lives with their spouse Lives in: House/apartment Stairs: Yes: External: 1+2 steps; none Has following equipment at home: Single point cane, Walker - 2 wheeled, Tour manager, and bed side commode  OCCUPATION: Retired  PLOF:  Independent and Leisure: reading, quilting, Training and development officer, yardwork, Geophysicist/field seismologist at [employer]  PATIENT GOALS: "Be able to go up/down steps in motor home so we can travel. Go back to work part-time."   OBJECTIVE: (objective measures completed at initial evaluation unless otherwise dated)  DIAGNOSTIC FINDINGS:  09/18/22 - DG R knee:  IMPRESSION: Medial compartment hemiarthroplasty without immediate postoperative complication.  PATIENT SURVEYS:  LEFS 13 / 80 = 16.3 %  COGNITION: Overall cognitive status: Within functional limits for tasks assessed    SENSATION: WFL  EDEMA:  Circumferential: 44 cm at joint line, 53 cm (5 cm above joint line)  MUSCLE LENGTH: Hamstrings: Mild/mod tight R>L ITB: Mild/mod tight R>L Piriformis: Mild tight R>L Hip flexors: Mild tight R>L Quads: Mod tight R>L Heelcord: NT  POSTURE:  No Significant postural limitations  PALPATION: R knee swollen and slightly warm to touch with mild erythema present.  Decreased R patellar mobility in all directions.  LOWER EXTREMITY ROM:  Active ROM Right eval Left eval R 09/27/22 R 09/29/22 R 10/02/22  Knee flexion 77 127 85 91 100  Knee extension -15 LAQ +2 -10 LAQ -9 LAQ -8 LAQ   Passive ROM Right eval R 09/27/22 R 10/02/22  Knee flexion 82 85   Knee extension -5 0 -1   LOWER EXTREMITY MMT:  MMT Right eval Left   eval  Hip flexion 4+ 5  Hip extension 4 4  Hip abduction 4- 4  Hip adduction 4 4+  Hip internal rotation 4 4+  Hip external rotation 4 4  Knee flexion 4 5  Knee extension 4  5  Ankle dorsiflexion 4 4  Ankle plantarflexion    Ankle inversion    Ankle eversion     (Blank rows = not tested)  FUNCTIONAL TESTS:  5 times sit to stand: 29.60 sec with weight shift to L LE Timed up and go (TUG): 23.79 sec with RW  GAIT: Distance walked: Clinic distances Assistive device utilized: Environmental consultant - 2 wheeled Level of assistance: Modified independence Gait pattern: step through pattern and  decreased hip/knee flexion- Right Comments: Ambulates with stiff R knee but good heel strike on weight acceptance   TODAY'S TREATMENT:  10/04/22 THERAPEUTIC EXERCISE: to improve flexibility, strength and mobility.  Demonstration, verbal and tactile cues throughout for technique. Nustep L5x60min Squats at counter depth to tolerance x 10  Church pew x 20  Retro steps RLE back x 20  Standing R TKE with GTB 12x3" Rec Bike x 6 min partial revolutions to full towards the end  MODALITIES: Game Ready vasopneumatic compression post session to R knee x 10 min, medium compression, 34 to reduce post-exercise pain and swelling/edema  09/29/22 THERAPEUTIC EXERCISE: to improve flexibility, strength and mobility.  Demonstration, verbal and tactile cues throughout for technique. Rec Bike - partial revolutions x 6 min Counter mini-squat 2 x 10 Standing heel/toe raise 2 x 10 Standing TKE with small ball against counter 10 x 3-5", 2 sets Seated heel slide with AAROM knee flexion L LE over R LE 10 x 3" Hip ADD ball squeeze + R LAQ 10 x 3"  GAIT TRAINING: To normalize gait pattern, improve safety with SPC, and prepare to wean to LRAD. 360 ft with [facility] - cues for proper sequencing of cane and symmetrical step length  THERAPEUTIC ACTIVITIES: R knee ROM assessment  MODALITIES: Game Ready vasopneumatic compression post session to R knee x 10 min, medium compression, 34 to reduce post-exercise pain and swelling/edema   09/29/22 THERAPEUTIC EXERCISE: to improve flexibility, strength and mobility.  Demonstration, verbal and tactile cues throughout  for technique. NuStep - L4 x 7 min Standing heel/toe raise 2x10 Retro step x 12 RLE back/LLE front Church pew x 12  Standing marching x 10 BLE Standing alt hip extension 2 x 10 bil, UE support on counter Standing alt hip abduction 2 x 10 bil, UE support on counter Supine R SLR x 10 Supine heel slides with peanut ball x 10   MANUAL THERAPY: To promote  improved flexibility, improved joint mobility, increased ROM, and reduced pain.  R Patellar mobs - all directions  MODALITIES: Ice pack to R knee for x 10 min with wedge under BLE    09/27/22 THERAPEUTIC EXERCISE: to improve flexibility, strength and mobility.  Demonstration, verbal and tactile cues throughout for technique. NuStep - L4 x 7 min Standing heel/toe raises 2 x 10 Standing alt hip abduction x 10 bil, UE support on counter - cues to avoid rotating foot outward Standing alt hip extension x 10 bil, UE support on counter Standing alt hip flexion march x 10 bil, UE support on counter Supine B HS curls with heels on peanut ball + strap assist for knee flexion AAROM x 20  MANUAL THERAPY: To promote improved flexibility, improved joint mobility, increased ROM, and reduced pain.  R Patellar mobs - all directions R knee ROM  MODALITIES: Game Ready vasopneumatic compression post session to R knee x 10 min, medium compression, 34 to reduce post-exercise pain and swelling/edema   09/25/22 Nustep L3x1min Seated R heel slides x 10  Seated R LAQ x 10  Standing 4 way WS 2 x 10 each direction Standing heel raise x 10  Standing HS curls x10  Supine R heel slides with strap 2x10 CP to R knee for x 10 min    09/21/22 - Eval THERAPEUTIC EXERCISE: to improve flexibility, strength and mobility.  Demonstration, verbal and tactile cues throughout for technique. Review of hospital issued HEP with addition of: Supine R heel slide/knee flexion AAROM with strap assist x 10  GAIT TRAINING: To normalize gait pattern and improve safety with RW . Instruction in normal gait pattern with increased R hip and knee flexion and we will heel strike on weight acceptance with step through pattern.  Instruction in proper positioning and movement RW during turning.  MODALITIES: Game Ready vasopneumatic compression post session to R knee x 10 min with R LE elevated on wedge bolster, medium compression, 34  to reduce post-exercise pain and swelling/edema   PATIENT EDUCATION:  Education details: HEP update - retro step and church pew   Person educated: Patient and Spouse Education method: Explanation, Demonstration, Actor cues, Verbal cues, and Handouts Education comprehension: verbalized understanding, returned demonstration, verbal cues required, tactile cues required, and needs further education  HOME EXERCISE PROGRAM: Access Code: [email or web address] URL: [email or web address] Date: 09/29/2022 Prepared by: Verta Ellen  Exercises - Supine Heel Slide with Strap  - 2 x daily - 7 x weekly - 2 sets - 10 reps - 3 sec hold - Heel Toe Raises with Counter Support  - 2 x daily - 7 x weekly - 2 sets - 10 reps - 3 sec hold - Standing Knee Flexion  - 2 x daily - 7 x weekly - 2 sets - 10 reps - 3 sec hold - Standing Hip Abduction with Counter Support  - 2 x daily - 7 x weekly - 2 sets - 10 reps - 2-3 sec hold - Standing Hip Extension with Counter Support  - 2 x daily - 7 x weekly -  2 sets - 10 reps - 2-3 sec hold - Standing March with Counter Support  - 2 x daily - 7 x weekly - 2 sets - 10 reps - 2-3 sec hold - Backward Weight Shift and Opposite Arm Raise with Walker  - 1 x daily - 7 x weekly - 2 sets - 10 reps - Church Pew  - 1 x daily - 7 x weekly - 2 sets - 10 reps   ASSESSMENT:  CLINICAL IMPRESSION: Pt continues to progress well. We are focusing on standing exercises to improve WB tolerance and knee strength in WB positions. She was able to complete full revolutions on the bike today 5x indicating improved ROM in R knee. GR post session to address edema. Snow is progressing well toward her goals and will benefit from continued skilled PT to address ongoing pain, ROM and strength deficits to improve mobility and activity tolerance with decreased pain interference.   OBJECTIVE IMPAIRMENTS: Abnormal gait, decreased activity tolerance, decreased balance, decreased endurance, decreased  knowledge of condition, decreased knowledge of use of DME, decreased mobility, difficulty walking, decreased ROM, decreased strength, decreased safety awareness, increased edema, increased fascial restrictions, impaired perceived functional ability, increased muscle spasms, impaired flexibility, and pain.   ACTIVITY LIMITATIONS: sitting, standing, squatting, sleeping, stairs, transfers, bed mobility, bathing, toileting, dressing, locomotion level, and caring for others  PARTICIPATION LIMITATIONS: meal prep, cleaning, laundry, driving, shopping, community activity, and yard work  PERSONAL FACTORS: Fitness, Past/current experiences, Time since onset of injury/illness/exacerbation, and 3+ comorbidities: OA, anxiety, HTN, Raynaud's phenomenon, GERD, B CTS, DVT s/p COVID in 2021, syncope  are also affecting patient's functional outcome.   REHAB POTENTIAL: Excellent  CLINICAL DECISION MAKING: Stable/uncomplicated  EVALUATION COMPLEXITY: Low   GOALS: Goals reviewed with patient? Yes  SHORT TERM GOALS: Target date: 10/12/2022  Patient will be independent with initial HEP. Baseline: pt performing hospital issued HEP Goal status: MET  10/02/22  2.  Patient will demonstrate improved R knee AROM to >/= 5-90 deg to allow for normal gait pattern. Baseline: R knee AROM -15-77, PROM -5-82 Goal status: MET  10/02/22 - R knee AROM -1-100, lacking 8 extension in LAQ  LONG TERM GOALS: Target date: 11/02/2022  Patient will be independent with advanced/ongoing HEP to improve outcomes and carryover.  Baseline:  Goal status: IN PROGRESS  2.  Patient will report at least 75% improvement in R knee pain to improve QOL. Baseline: 5-6/10 Goal status: IN PROGRESS  3.  Patient will demonstrate improved R knee AROM to >/= -2-120 deg to allow for normal gait and stair mechanics. Baseline: R knee AROM -15-77, PROM -5-82 Goal status: IN PROGRESS  4.  Patient will demonstrate improved B LE strength to >/=  4+/5 for improved stability and ease of mobility. Baseline: refer to above LE MMT table Goal status: IN PROGRESS  5.  Patient will be able to ambulate 600' with or w/o LRAD and normal gait pattern without increased pain to access community.  Baseline: antalgic gait with RW Goal status: IN PROGRESS  6. Patient will be able to ascend/descend stairs with 1 HR and reciprocal step pattern safely to access home and community.  Baseline: NT Goal status: IN PROGRESS  7.  Patient will report >/= 30/80 on LEFS to demonstrate improved functional ability. Baseline: 13 / 80 = 16.3 % Goal status: IN PROGRESS  8.  Patient will improve 5x STS time to </= 15 seconds with even weight shift to demonstrate improved functional strength and transfer efficiency .  Baseline: 29.60 sec with weight shift to L LE Goal status: IN PROGRESS   9.  Patient will demonstrate decreased TUG time to </= 13.5 sec with or w/o LRAD to decrease risk for falls with transitional mobility Baseline: 23.79 sec with RW Goal status: IN PROGRESS   PLAN:  PT FREQUENCY: 2-3x/week (3x/wk x 2 wks, tapering to 2x/wk for duration of POC)  PT DURATION: 4-6 weeks  PLANNED INTERVENTIONS: Therapeutic exercises, Therapeutic activity, Neuromuscular re-education, Balance training, Gait training, Patient/Family education, Self Care, Joint mobilization, Stair training, DME instructions, Dry Needling, Electrical stimulation, Cryotherapy, Moist heat, Taping, Vasopneumatic device, Ultrasound, Ionotophoresis 4mg /ml Dexamethasone, Manual therapy, and Re-evaluation  PLAN FOR NEXT SESSION: progress R knee ROM and B LE strengthening - gradually progress to standing activities; review & update HEP PRN; balance training; gait training to normalize gait pattern and work on weaning to [facility] as indicated; MT +/- modalities for R knee pain and R LE edema management   Darleene Cleaver, PTA 10/04/2022, 11:22 AM    Date of referral: 08/10/22 Referring provider:  Joen Laura, MD Referring diagnosis? U13.244 (ICD-10-CM) - Status post right partial knee replacement  Treatment diagnosis? (if different than referring diagnosis)  Stiffness of right knee, not elsewhere classified  Acute pain of right knee  Other abnormalities of gait and mobility  Muscle weakness (generalized)  Localized edema  What was this (referring dx) caused by? Surgery (Type: R unicondylar knee replacement) and Arthritis  Nature of Condition: Initial Onset (within last 3 months)   Laterality: Rt  Current Functional Measure Score: LEFS 13 / 80 = 16.3 %  Objective measurements identify impairments when they are compared to normal values, the uninvolved extremity, and prior level of function.  [x]  Yes  []  No  Objective assessment of functional ability: Moderate functional limitations   Briefly describe symptoms: Current deficits include R knee pain, increased edema in R LE, limited and painful R knee ROM, R>L LE weakness and impaired ADLs, gait and mobility.  How did symptoms start: OA leading to R unicondylar knee replacement on 09/18/22  Average pain intensity:  Last 24 hours: 5-6/10  Past week: 5-6/10  How often does the pt experience symptoms? Constantly  How much have the symptoms interfered with usual daily activities? Quite a bit  How has condition changed since care began at this facility? NA - initial visit  In general, how is the patients overall health? Very Good

## 2022-10-05 ENCOUNTER — Other Ambulatory Visit: Payer: Self-pay | Admitting: Family

## 2022-10-06 ENCOUNTER — Ambulatory Visit: Payer: Medicare Other

## 2022-10-06 DIAGNOSIS — R2689 Other abnormalities of gait and mobility: Secondary | ICD-10-CM

## 2022-10-06 DIAGNOSIS — M25661 Stiffness of right knee, not elsewhere classified: Secondary | ICD-10-CM

## 2022-10-06 DIAGNOSIS — M6281 Muscle weakness (generalized): Secondary | ICD-10-CM

## 2022-10-06 DIAGNOSIS — M25561 Pain in right knee: Secondary | ICD-10-CM

## 2022-10-06 DIAGNOSIS — R600 Localized edema: Secondary | ICD-10-CM | POA: Diagnosis not present

## 2022-10-06 NOTE — Therapy (Signed)
OUTPATIENT PHYSICAL THERAPY TREATMENT      Patient Name: Megan Crane MRN: [id number] DOB:[birth year], 72 y.o., female Today's Date: 10/06/2022   END OF SESSION:  PT End of Session - 10/06/22 1116     Visit Number 7    Date for PT Re-Evaluation 11/02/22    Authorization Type [employer]    Authorization Time Period 09/21/22-11/02/22    Authorization - Visit Number 7    Authorization - Number of Visits 16    Progress Note Due on Visit 15   MD PN on visit #5 - 10/02/22   PT Start Time 1015    PT Stop Time 1115    PT Time Calculation (min) 60 min    Activity Tolerance Patient tolerated treatment well    Behavior During Therapy WFL for tasks assessed/performed                   Past Medical History:  Diagnosis Date   Anxiety disorder    hx vasovagal responses   Arthritis    Cancer (HCC)    Deep vein thrombosis (DVT) (HCC)    hx of in left left after covid in 2021   Headache(784.0)    History of kidney stones    Hydronephrosis, right    Hyperlipidemia    Hypertension    Nephrolithiasis    Pneumonia    Positive nasal culture for methicillin resistant Staphylococcus aureus    Right ureteral stone    Thrombophlebitis of leg, left, superficial 11/17/2020   Urinary incontinence    sees Dr. McDiarmid    Vasovagal syncope 08/02/2022   Past Surgical History:  Procedure Laterality Date   BLADDER REPAIR     COLONOSCOPY  12/28/2015   per Dr. Adela Lank, serrated polyps and diverticula, repeat in 3 yrs    CYSTOSCOPY  06/09/2011   Procedure: CYSTOSCOPY;  Surgeon: Martina Sinner, MD;  Location: [facility];  Service: Urology;  Laterality: N/A;   CYSTOSCOPY W/ URETERAL STENT PLACEMENT  02/03/2014   Procedure: CYSTOSCOPY WITH  RIGHT RETROGRADE PYELOGRAM/ RIGHT URETERAL STENT PLACEMENT;  Surgeon: Anner Crete, MD;  Location: [facility];  Service: Urology;;   CYSTOSCOPY WITH BIOPSY  02/03/2014   Procedure: CYSTOSCOPY WITH BIOPSY;  Surgeon: Anner Crete,  MD;  Location: [facility];  Service: Urology;;   PARTIAL KNEE ARTHROPLASTY Right 09/18/2022   Procedure: UNICOMPARTMENTAL KNEE;  Surgeon: Joen Laura, MD;  Location: [facility];  Service: Orthopedics;  Laterality: Right;   PILONIDAL CYST EXCISION  1974   RECTOCELE REPAIR  06/09/2011   Procedure: POSTERIOR REPAIR (RECTOCELE);  Surgeon: Martina Sinner, MD;  Location: [facility];  Service: Urology;  Laterality: N/A;  Xenform graft 6x10   RIGHT URETEROSCOPIC STONE EXTRACTION / STENT PLACEMENT  03/13/2000   URETEROSCOPY Right 02/03/2014   Procedure: URETEROSCOPY WITH MANAGEMENT OF URETERAL STRICTURE;  Surgeon: Anner Crete, MD;  Location: [facility];  Service: Urology;  Laterality: Right;   VAGINAL HYSTERECTOMY  06/09/2011   Procedure: HYSTERECTOMY VAGINAL;  Surgeon: Mickel Baas, MD;  Location: [facility];  Service: Gynecology;  Laterality: N/A;   VAGINAL PROLAPSE REPAIR  06/09/2011   Procedure: VAGINAL VAULT SUSPENSION;  Surgeon: Martina Sinner, MD;  Location: [facility];  Service: Urology;  Laterality: N/A;   VEIN LIGATION AND STRIPPING     LEFT LEG   Patient Active Problem List   Diagnosis Date Noted   S/P right unicompartmental knee replacement 09/18/2022  IC (interstitial cystitis) 09/04/2022   Pelvic pressure in female 08/22/2022   Visit for pre-operative examination 08/07/2022   Vasovagal syncope 08/02/2022   Nasal dryness 06/30/2022   Precancerous skin lesion 06/30/2022   Bilateral carpal tunnel syndrome 03/02/2022   Swelling of both hands 03/02/2022   Atypical chest pain 03/02/2022   OAB (overactive bladder) 11/23/2021   Corn of foot 11/23/2021   Atrophic vaginitis 11/23/2021   COVID-19 virus infection 11/17/2020   Raynaud's phenomenon without gangrene 11/17/2020   Primary osteoarthritis involving multiple joints 11/24/2019   GERD (gastroesophageal reflux disease) 12/26/2016   Edema 05/29/2014   Nasal colonization with methicillin-resistant  Staphylococcus aureus 01/22/2014   Varicose veins of bilateral lower extremities with other complications 12/11/2012   Allergic rhinitis 11/29/2009   MOTION SICKNESS 11/29/2009   Hyperlipidemia 08/31/2008   Anxiety state 08/31/2008   Primary hypertension 08/31/2008   PILONIDAL CYST 08/31/2008   HEADACHE 08/31/2008   URINARY INCONTINENCE 08/31/2008   NEPHROLITHIASIS, HX OF 08/31/2008   POSTNASAL DRIP SYNDROME 05/28/2006    PCP: Nelwyn Salisbury, MD   REFERRING PROVIDER: Joen Laura, MD   REFERRING DIAG: [phone] (ICD-10-CM) - Status post right partial knee replacement   THERAPY DIAG:  Stiffness of right knee, not elsewhere classified  Acute pain of right knee  Other abnormalities of gait and mobility  Muscle weakness (generalized)  Localized edema  RATIONALE FOR EVALUATION AND TREATMENT: Rehabilitation  ONSET DATE: 09/18/2022 - R unicompartmental knee replacement  NEXT MD VISIT: 10/03/2022   SUBJECTIVE:                                                                                                                                                                                                         SUBJECTIVE STATEMENT: Pt reports she did not have a good day yesterday but she still did her exercises.   PAIN: Are you having pain? Yes: NPRS scale: 2/10 Pain location: R anterior lower leg Pain description: sore Aggravating factors: prolonged sitting and first getting up Relieving factors: walking  PERTINENT HISTORY:  OA, anxiety, HTN, Raynaud's phenomenon, GERD, B CTS, DVT s/p COVID in 2021, syncope  PRECAUTIONS: None  RED FLAGS: None  WEIGHT BEARING RESTRICTIONS: No  FALLS:  Has patient fallen in last 6 months? No  LIVING ENVIRONMENT: Lives with: lives with their spouse Lives in: House/apartment Stairs: Yes: External: 1+2 steps; none Has following equipment at home: Single point cane, Walker - 2 wheeled, Tour manager, and bed side  commode  OCCUPATION: Retired  PLOF: Independent and Leisure: reading, quilting, Training and development officer, yardwork, Silver  Sneakers at [facility] ~2x/month  PATIENT GOALS: "Be able to go up/down steps in motor home so we can travel. Go back to work part-time."   OBJECTIVE: (objective measures completed at initial evaluation unless otherwise dated)  DIAGNOSTIC FINDINGS:  09/18/22 - DG R knee:  IMPRESSION: Medial compartment hemiarthroplasty without immediate postoperative complication.  PATIENT SURVEYS:  LEFS 13 / 80 = 16.3 %  COGNITION: Overall cognitive status: Within functional limits for tasks assessed    SENSATION: WFL  EDEMA:  Circumferential: 44 cm at joint line, 53 cm (5 cm above joint line)  MUSCLE LENGTH: Hamstrings: Mild/mod tight R>L ITB: Mild/mod tight R>L Piriformis: Mild tight R>L Hip flexors: Mild tight R>L Quads: Mod tight R>L Heelcord: NT  POSTURE:  No Significant postural limitations  PALPATION: R knee swollen and slightly warm to touch with mild erythema present.  Decreased R patellar mobility in all directions.  LOWER EXTREMITY ROM:  Active ROM Right eval Left eval R 09/27/22 R 09/29/22 R 10/02/22 10/06/22 R  Knee flexion 77 127 85 91 100 105  Knee extension -15 LAQ +2 -10 LAQ -9 LAQ -8 LAQ 7 LAQ   Passive ROM Right eval R 09/27/22 R 10/02/22  Knee flexion 82 85   Knee extension -5 0 -1   LOWER EXTREMITY MMT:  MMT Right eval Left   eval  Hip flexion 4+ 5  Hip extension 4 4  Hip abduction 4- 4  Hip adduction 4 4+  Hip internal rotation 4 4+  Hip external rotation 4 4  Knee flexion 4 5  Knee extension 4  5  Ankle dorsiflexion 4 4  Ankle plantarflexion    Ankle inversion    Ankle eversion     (Blank rows = not tested)  FUNCTIONAL TESTS:  5 times sit to stand: 29.60 sec with weight shift to L LE Timed up and go (TUG): 23.79 sec with RW  GAIT: Distance walked: Clinic distances Assistive device utilized: Environmental consultant - 2 wheeled Level of assistance: Modified  independence Gait pattern: step through pattern and decreased hip/knee flexion- Right Comments: Ambulates with stiff R knee but good heel strike on weight acceptance   TODAY'S TREATMENT:  10/06/22 THERAPEUTIC EXERCISE: to improve flexibility, strength and mobility.  Demonstration, verbal and tactile cues throughout for technique. Bike partial rev at first then full rev for x 5 min Fitter leg press x 10 RLE Squats x 10 - burning in R knee at the end Single leg deadlift x 10  Retro gait 4x down and back counter Seated R HS curls GTB x 10  Seated R LAQ GTB x 10  Measured R knee ROM  MANUAL THERAPY: To promote improved flexibility, improved joint mobility, increased ROM, and reduced pain.  R Patellar mobs - all directions Education on patellar mobs  MODALITIES: Game Ready vasopneumatic compression post session to R knee x 10 min, medium compression, 34 to reduce post-exercise pain and swelling/edema 10/04/22 THERAPEUTIC EXERCISE: to improve flexibility, strength and mobility.  Demonstration, verbal and tactile cues throughout for technique. Nustep L5x37min Squats at counter depth to tolerance x 10  Church pew x 20  Retro steps RLE back x 20  Standing R TKE with GTB 12x3" Rec Bike x 6 min partial revolutions to full towards the end  MODALITIES: Game Ready vasopneumatic compression post session to R knee x 10 min, medium compression, 34 to reduce post-exercise pain and swelling/edema  09/29/22 THERAPEUTIC EXERCISE: to improve flexibility, strength and mobility.  Demonstration, verbal and tactile cues throughout  for technique. Rec Bike - partial revolutions x 6 min Counter mini-squat 2 x 10 Standing heel/toe raise 2 x 10 Standing TKE with small ball against counter 10 x 3-5", 2 sets Seated heel slide with AAROM knee flexion L LE over R LE 10 x 3" Hip ADD ball squeeze + R LAQ 10 x 3"  GAIT TRAINING: To normalize gait pattern, improve safety with SPC, and prepare to wean to  LRAD. 360 ft with [facility] - cues for proper sequencing of cane and symmetrical step length  THERAPEUTIC ACTIVITIES: R knee ROM assessment  MODALITIES: Game Ready vasopneumatic compression post session to R knee x 10 min, medium compression, 34 to reduce post-exercise pain and swelling/edema   09/29/22 THERAPEUTIC EXERCISE: to improve flexibility, strength and mobility.  Demonstration, verbal and tactile cues throughout for technique. NuStep - L4 x 7 min Standing heel/toe raise 2x10 Retro step x 12 RLE back/LLE front Church pew x 12  Standing marching x 10 BLE Standing alt hip extension 2 x 10 bil, UE support on counter Standing alt hip abduction 2 x 10 bil, UE support on counter Supine R SLR x 10 Supine heel slides with peanut ball x 10   MANUAL THERAPY: To promote improved flexibility, improved joint mobility, increased ROM, and reduced pain.  R Patellar mobs - all directions  MODALITIES: Ice pack to R knee for x 10 min with wedge under BLE    09/27/22 THERAPEUTIC EXERCISE: to improve flexibility, strength and mobility.  Demonstration, verbal and tactile cues throughout for technique. NuStep - L4 x 7 min Standing heel/toe raises 2 x 10 Standing alt hip abduction x 10 bil, UE support on counter - cues to avoid rotating foot outward Standing alt hip extension x 10 bil, UE support on counter Standing alt hip flexion march x 10 bil, UE support on counter Supine B HS curls with heels on peanut ball + strap assist for knee flexion AAROM x 20  MANUAL THERAPY: To promote improved flexibility, improved joint mobility, increased ROM, and reduced pain.  R Patellar mobs - all directions R knee ROM  MODALITIES: Game Ready vasopneumatic compression post session to R knee x 10 min, medium compression, 34 to reduce post-exercise pain and swelling/edema   09/25/22 Nustep L3x78min Seated R heel slides x 10  Seated R LAQ x 10  Standing 4 way WS 2 x 10 each direction Standing heel raise x  10  Standing HS curls x10  Supine R heel slides with strap 2x10 CP to R knee for x 10 min    09/21/22 - Eval THERAPEUTIC EXERCISE: to improve flexibility, strength and mobility.  Demonstration, verbal and tactile cues throughout for technique. Review of hospital issued HEP with addition of: Supine R heel slide/knee flexion AAROM with strap assist x 10  GAIT TRAINING: To normalize gait pattern and improve safety with RW . Instruction in normal gait pattern with increased R hip and knee flexion and we will heel strike on weight acceptance with step through pattern.  Instruction in proper positioning and movement RW during turning.  MODALITIES: Game Ready vasopneumatic compression post session to R knee x 10 min with R LE elevated on wedge bolster, medium compression, 34 to reduce post-exercise pain and swelling/edema   PATIENT EDUCATION:  Education details: HEP update - retro step and church pew   Person educated: Patient and Spouse Education method: Explanation, Demonstration, Tactile cues, Verbal cues, and Handouts Education comprehension: verbalized understanding, returned demonstration, verbal cues required, tactile cues required,  and needs further education  HOME EXERCISE PROGRAM: Access Code: [email or web address] URL: [email or web address] Date: 09/29/2022 Prepared by: Verta Ellen  Exercises - Supine Heel Slide with Strap  - 2 x daily - 7 x weekly - 2 sets - 10 reps - 3 sec hold - Heel Toe Raises with Counter Support  - 2 x daily - 7 x weekly - 2 sets - 10 reps - 3 sec hold - Standing Knee Flexion  - 2 x daily - 7 x weekly - 2 sets - 10 reps - 3 sec hold - Standing Hip Abduction with Counter Support  - 2 x daily - 7 x weekly - 2 sets - 10 reps - 2-3 sec hold - Standing Hip Extension with Counter Support  - 2 x daily - 7 x weekly - 2 sets - 10 reps - 2-3 sec hold - Standing March with Counter Support  - 2 x daily - 7 x weekly - 2 sets - 10 reps - 2-3 sec hold - Backward  Weight Shift and Opposite Arm Raise with Walker  - 1 x daily - 7 x weekly - 2 sets - 10 reps - Church Pew  - 1 x daily - 7 x weekly - 2 sets - 10 reps   ASSESSMENT:  CLINICAL IMPRESSION: Pt continues to progress well, showing improvement in R knee AROM. Demonstrates decreased gait speed with cane but she is still getting used to walking with it. She started to have some burning across her patella after the squats so we avoided any closed chain knee loading exercises after that. Instruction given on patellar mobs with return demo. GR post session to address edema. Elta is progressing well toward her goals and will benefit from continued skilled PT to address ongoing pain, ROM and strength deficits to improve mobility and activity tolerance with decreased pain interference.   OBJECTIVE IMPAIRMENTS: Abnormal gait, decreased activity tolerance, decreased balance, decreased endurance, decreased knowledge of condition, decreased knowledge of use of DME, decreased mobility, difficulty walking, decreased ROM, decreased strength, decreased safety awareness, increased edema, increased fascial restrictions, impaired perceived functional ability, increased muscle spasms, impaired flexibility, and pain.   ACTIVITY LIMITATIONS: sitting, standing, squatting, sleeping, stairs, transfers, bed mobility, bathing, toileting, dressing, locomotion level, and caring for others  PARTICIPATION LIMITATIONS: meal prep, cleaning, laundry, driving, shopping, community activity, and yard work  PERSONAL FACTORS: Fitness, Past/current experiences, Time since onset of injury/illness/exacerbation, and 3+ comorbidities: OA, anxiety, HTN, Raynaud's phenomenon, GERD, B CTS, DVT s/p COVID in 2021, syncope  are also affecting patient's functional outcome.   REHAB POTENTIAL: Excellent  CLINICAL DECISION MAKING: Stable/uncomplicated  EVALUATION COMPLEXITY: Low   GOALS: Goals reviewed with patient? Yes  SHORT TERM GOALS: Target  date: 10/12/2022  Patient will be independent with initial HEP. Baseline: pt performing hospital issued HEP Goal status: MET  10/02/22  2.  Patient will demonstrate improved R knee AROM to >/= 5-90 deg to allow for normal gait pattern. Baseline: R knee AROM -15-77, PROM -5-82 Goal status: MET  10/02/22 - R knee AROM -1-100, lacking 8 extension in LAQ  LONG TERM GOALS: Target date: 11/02/2022  Patient will be independent with advanced/ongoing HEP to improve outcomes and carryover.  Baseline:  Goal status: IN PROGRESS  2.  Patient will report at least 75% improvement in R knee pain to improve QOL. Baseline: 5-6/10 Goal status: IN PROGRESS  3.  Patient will demonstrate improved R knee AROM to >/= -2-120 deg to allow for normal gait and stair  mechanics. Baseline: R knee AROM -15-77, PROM -5-82 Goal status: IN PROGRESS  4.  Patient will demonstrate improved B LE strength to >/= 4+/5 for improved stability and ease of mobility. Baseline: refer to above LE MMT table Goal status: IN PROGRESS  5.  Patient will be able to ambulate 600' with or w/o LRAD and normal gait pattern without increased pain to access community.  Baseline: antalgic gait with RW Goal status: IN PROGRESS  6. Patient will be able to ascend/descend stairs with 1 HR and reciprocal step pattern safely to access home and community.  Baseline: NT Goal status: IN PROGRESS  7.  Patient will report >/= 30/80 on LEFS to demonstrate improved functional ability. Baseline: 13 / 80 = 16.3 % Goal status: IN PROGRESS  8.  Patient will improve 5x STS time to </= 15 seconds with even weight shift to demonstrate improved functional strength and transfer efficiency . Baseline: 29.60 sec with weight shift to L LE Goal status: IN PROGRESS   9.  Patient will demonstrate decreased TUG time to </= 13.5 sec with or w/o LRAD to decrease risk for falls with transitional mobility Baseline: 23.79 sec with RW Goal status: IN  PROGRESS   PLAN:  PT FREQUENCY: 2-3x/week (3x/wk x 2 wks, tapering to 2x/wk for duration of POC)  PT DURATION: 4-6 weeks  PLANNED INTERVENTIONS: Therapeutic exercises, Therapeutic activity, Neuromuscular re-education, Balance training, Gait training, Patient/Family education, Self Care, Joint mobilization, Stair training, DME instructions, Dry Needling, Electrical stimulation, Cryotherapy, Moist heat, Taping, Vasopneumatic device, Ultrasound, Ionotophoresis 4mg /ml Dexamethasone, Manual therapy, and Re-evaluation  PLAN FOR NEXT SESSION: progress R knee ROM and B LE strengthening - gradually progress to standing activities; review & update HEP PRN; balance training; gait training to normalize gait pattern and work on weaning to [facility] as indicated; MT +/- modalities for R knee pain and R LE edema management   Darleene Cleaver, PTA 10/06/2022, 11:32 AM    Date of referral: 08/10/22 Referring provider: Joen Laura, MD Referring diagnosis? O27.035 (ICD-10-CM) - Status post right partial knee replacement  Treatment diagnosis? (if different than referring diagnosis)  Stiffness of right knee, not elsewhere classified  Acute pain of right knee  Other abnormalities of gait and mobility  Muscle weakness (generalized)  Localized edema  What was this (referring dx) caused by? Surgery (Type: R unicondylar knee replacement) and Arthritis  Nature of Condition: Initial Onset (within last 3 months)   Laterality: Rt  Current Functional Measure Score: LEFS 13 / 80 = 16.3 %  Objective measurements identify impairments when they are compared to normal values, the uninvolved extremity, and prior level of function.  [x]  Yes  []  No  Objective assessment of functional ability: Moderate functional limitations   Briefly describe symptoms: Current deficits include R knee pain, increased edema in R LE, limited and painful R knee ROM, R>L LE weakness and impaired ADLs, gait and mobility.  How did  symptoms start: OA leading to R unicondylar knee replacement on 09/18/22  Average pain intensity:  Last 24 hours: 5-6/10  Past week: 5-6/10  How often does the pt experience symptoms? Constantly  How much have the symptoms interfered with usual daily activities? Quite a bit  How has condition changed since care began at this facility? NA - initial visit  In general, how is the patients overall health? Very Good

## 2022-10-10 ENCOUNTER — Ambulatory Visit: Payer: Medicare Other | Attending: Orthopedic Surgery | Admitting: Physical Therapy

## 2022-10-10 ENCOUNTER — Encounter: Payer: Self-pay | Admitting: Physical Therapy

## 2022-10-10 DIAGNOSIS — R600 Localized edema: Secondary | ICD-10-CM | POA: Diagnosis not present

## 2022-10-10 DIAGNOSIS — M25561 Pain in right knee: Secondary | ICD-10-CM | POA: Diagnosis not present

## 2022-10-10 DIAGNOSIS — M6281 Muscle weakness (generalized): Secondary | ICD-10-CM | POA: Insufficient documentation

## 2022-10-10 DIAGNOSIS — R2689 Other abnormalities of gait and mobility: Secondary | ICD-10-CM | POA: Diagnosis not present

## 2022-10-10 DIAGNOSIS — M25661 Stiffness of right knee, not elsewhere classified: Secondary | ICD-10-CM | POA: Insufficient documentation

## 2022-10-10 NOTE — Therapy (Signed)
OUTPATIENT PHYSICAL THERAPY TREATMENT      Patient Name: Megan Crane MRN: [id number] DOB:1950/05/01, 72 y.o., female Today's Date: 10/10/2022   END OF SESSION:  PT End of Session - 10/10/22 1016     Visit Number 8    Date for PT Re-Evaluation 11/02/22    Authorization Type [employer]    Authorization Time Period 09/21/22-11/02/22    Authorization - Visit Number 8    Authorization - Number of Visits 16    Progress Note Due on Visit 15   MD PN on visit #5 - 10/02/22   PT Start Time 1016    PT Stop Time 1117    PT Time Calculation (min) 61 min    Activity Tolerance Patient tolerated treatment well    Behavior During Therapy WFL for tasks assessed/performed                   Past Medical History:  Diagnosis Date   Anxiety disorder    hx vasovagal responses   Arthritis    Cancer (HCC)    Deep vein thrombosis (DVT) (HCC)    hx of in left left after covid in 2021   Headache(784.0)    History of kidney stones    Hydronephrosis, right    Hyperlipidemia    Hypertension    Nephrolithiasis    Pneumonia    Positive nasal culture for methicillin resistant Staphylococcus aureus    Right ureteral stone    Thrombophlebitis of leg, left, superficial 11/17/2020   Urinary incontinence    sees Dr. McDiarmid    Vasovagal syncope 08/02/2022   Past Surgical History:  Procedure Laterality Date   BLADDER REPAIR     COLONOSCOPY  12/28/2015   per Dr. Adela Lank, serrated polyps and diverticula, repeat in 3 yrs    CYSTOSCOPY  06/09/2011   Procedure: CYSTOSCOPY;  Surgeon: Martina Sinner, MD;  Location: [facility];  Service: Urology;  Laterality: N/A;   CYSTOSCOPY W/ URETERAL STENT PLACEMENT  02/03/2014   Procedure: CYSTOSCOPY WITH  RIGHT RETROGRADE PYELOGRAM/ RIGHT URETERAL STENT PLACEMENT;  Surgeon: Anner Crete, MD;  Location: [facility];  Service: Urology;;   CYSTOSCOPY WITH BIOPSY  02/03/2014   Procedure: CYSTOSCOPY WITH BIOPSY;  Surgeon: Anner Crete,  MD;  Location: [facility];  Service: Urology;;   PARTIAL KNEE ARTHROPLASTY Right 09/18/2022   Procedure: UNICOMPARTMENTAL KNEE;  Surgeon: Joen Laura, MD;  Location: [facility];  Service: Orthopedics;  Laterality: Right;   PILONIDAL CYST EXCISION  1974   RECTOCELE REPAIR  06/09/2011   Procedure: POSTERIOR REPAIR (RECTOCELE);  Surgeon: Martina Sinner, MD;  Location: [facility];  Service: Urology;  Laterality: N/A;  Xenform graft 6x10   RIGHT URETEROSCOPIC STONE EXTRACTION / STENT PLACEMENT  03/13/2000   URETEROSCOPY Right 02/03/2014   Procedure: URETEROSCOPY WITH MANAGEMENT OF URETERAL STRICTURE;  Surgeon: Anner Crete, MD;  Location: [facility];  Service: Urology;  Laterality: Right;   VAGINAL HYSTERECTOMY  06/09/2011   Procedure: HYSTERECTOMY VAGINAL;  Surgeon: Mickel Baas, MD;  Location: [facility];  Service: Gynecology;  Laterality: N/A;   VAGINAL PROLAPSE REPAIR  06/09/2011   Procedure: VAGINAL VAULT SUSPENSION;  Surgeon: Martina Sinner, MD;  Location: [facility];  Service: Urology;  Laterality: N/A;   VEIN LIGATION AND STRIPPING     LEFT LEG   Patient Active Problem List   Diagnosis Date Noted   S/P right unicompartmental knee replacement 09/18/2022  IC (interstitial cystitis) 09/04/2022   Pelvic pressure in female 08/22/2022   Visit for pre-operative examination 08/07/2022   Vasovagal syncope 08/02/2022   Nasal dryness 06/30/2022   Precancerous skin lesion 06/30/2022   Bilateral carpal tunnel syndrome 03/02/2022   Swelling of both hands 03/02/2022   Atypical chest pain 03/02/2022   OAB (overactive bladder) 11/23/2021   Corn of foot 11/23/2021   Atrophic vaginitis 11/23/2021   COVID-19 virus infection 11/17/2020   Raynaud's phenomenon without gangrene 11/17/2020   Primary osteoarthritis involving multiple joints 11/24/2019   GERD (gastroesophageal reflux disease) 12/26/2016   Edema 05/29/2014   Nasal colonization with methicillin-resistant  Staphylococcus aureus 01/22/2014   Varicose veins of bilateral lower extremities with other complications 12/11/2012   Allergic rhinitis 11/29/2009   MOTION SICKNESS 11/29/2009   Hyperlipidemia 08/31/2008   Anxiety state 08/31/2008   Primary hypertension 08/31/2008   PILONIDAL CYST 08/31/2008   HEADACHE 08/31/2008   URINARY INCONTINENCE 08/31/2008   NEPHROLITHIASIS, HX OF 08/31/2008   POSTNASAL DRIP SYNDROME 05/28/2006    PCP: Nelwyn Salisbury, MD   REFERRING PROVIDER: Joen Laura, MD   REFERRING DIAG: [phone] (ICD-10-CM) - Status post right partial knee replacement   THERAPY DIAG:  Stiffness of right knee, not elsewhere classified  Acute pain of right knee  Other abnormalities of gait and mobility  Muscle weakness (generalized)  Localized edema  RATIONALE FOR EVALUATION AND TREATMENT: Rehabilitation  ONSET DATE: 09/18/2022 - R unicompartmental knee replacement  NEXT MD VISIT: 10/31/2022   SUBJECTIVE:                                                                                                                                                                                                         SUBJECTIVE STATEMENT: Pt reports some increased stiffness over the weekend but this seems to be improving.   PAIN: Are you having pain? Yes: NPRS scale: 2/10 Pain location: R anterior lower leg Pain description: sore, stiff Aggravating factors: prolonged sitting and first getting up Relieving factors: walking  PERTINENT HISTORY:  OA, anxiety, HTN, Raynaud's phenomenon, GERD, B CTS, DVT s/p COVID in 2021, syncope  PRECAUTIONS: None  RED FLAGS: None  WEIGHT BEARING RESTRICTIONS: No  FALLS:  Has patient fallen in last 6 months? No  LIVING ENVIRONMENT: Lives with: lives with their spouse Lives in: House/apartment Stairs: Yes: External: 1+2 steps; none Has following equipment at home: Single point cane, Walker - 2 wheeled, Tour manager, and bed side  commode  OCCUPATION: Retired  PLOF: Independent and Leisure: reading, quilting, Training and development officer, yardwork, [employer]  at [employer] ~2x/month  PATIENT GOALS: "Be able to go up/down steps in motor home so we can travel. Go back to work part-time."   OBJECTIVE: (objective measures completed at initial evaluation unless otherwise dated)  DIAGNOSTIC FINDINGS:  09/18/22 - DG R knee:  IMPRESSION: Medial compartment hemiarthroplasty without immediate postoperative complication.  PATIENT SURVEYS:  LEFS 13 / 80 = 16.3 %  COGNITION: Overall cognitive status: Within functional limits for tasks assessed    SENSATION: WFL  EDEMA:  Circumferential: 44 cm at joint line, 53 cm (5 cm above joint line)  MUSCLE LENGTH: Hamstrings: Mild/mod tight R>L ITB: Mild/mod tight R>L Piriformis: Mild tight R>L Hip flexors: Mild tight R>L Quads: Mod tight R>L Heelcord: NT  POSTURE:  No Significant postural limitations  PALPATION: R knee swollen and slightly warm to touch with mild erythema present.  Decreased R patellar mobility in all directions.  LOWER EXTREMITY ROM:  Active ROM Right eval Left eval R 09/27/22 R 09/29/22 R 10/02/22 R 10/06/22  Knee flexion 77 127 85 91 100 105  Knee extension -15 LAQ +2 -10 LAQ -9 LAQ -8 LAQ -7 LAQ   Passive ROM Right eval R 09/27/22 R 10/02/22  Knee flexion 82 85   Knee extension -5 0 -1   LOWER EXTREMITY MMT:  MMT Right eval Left   eval  Hip flexion 4+ 5  Hip extension 4 4  Hip abduction 4- 4  Hip adduction 4 4+  Hip internal rotation 4 4+  Hip external rotation 4 4  Knee flexion 4 5  Knee extension 4  5  Ankle dorsiflexion 4 4  Ankle plantarflexion    Ankle inversion    Ankle eversion     (Blank rows = not tested)  FUNCTIONAL TESTS:  5 times sit to stand: 29.60 sec with weight shift to L LE Timed up and go (TUG): 23.79 sec with RW  GAIT: Distance walked: Clinic distances Assistive device utilized: Environmental consultant - 2 wheeled Level of assistance: Modified  independence Gait pattern: step through pattern and decreased hip/knee flexion- Right Comments: Ambulates with stiff R knee but good heel strike on weight acceptance   TODAY'S TREATMENT:   10/10/22 THERAPEUTIC EXERCISE: to improve flexibility, strength and mobility.  Demonstration, verbal and tactile cues throughout for technique.  Rec Bike - L1 x 7 min R standing runner's gastroc stretch 2 x 30" R seated hip hinge HS stretch + strap for gastroc stretch 2 x 30"  SELF CARE:  Instructed pt in desensitization techniques to reduce hypersensitivity in R anterior knee.  MANUAL THERAPY: To promote normalized muscle tension, improved flexibility, improved joint mobility, increased ROM, and reduced pain. R patellar glides/mobs - all directions - Pt instructed in self-performance for home  NEUROMUSCULAR RE-EDUCATION: To improve balance, proprioception, and coordination. R SLS + 5-way star reach to colored dots x 10, cane for balance  MODALITIES: Game Ready vasopneumatic compression post session to R knee x 10 min, medium compression, 34 to reduce post-exercise pain and swelling/edema   10/06/22 THERAPEUTIC EXERCISE: to improve flexibility, strength and mobility.  Demonstration, verbal and tactile cues throughout for technique. Bike partial rev at first then full rev for x 5 min Fitter leg press x 10 RLE Squats x 10 - burning in R knee at the end Single leg deadlift x 10  Retro gait 4x down and back counter Seated R HS curls GTB x 10  Seated R LAQ GTB x 10  Measured R knee ROM  MANUAL THERAPY: To promote  improved flexibility, improved joint mobility, increased ROM, and reduced pain.  R Patellar mobs - all directions Education on patellar mobs  MODALITIES: Game Ready vasopneumatic compression post session to R knee x 10 min, medium compression, 34 to reduce post-exercise pain and swelling/edema   10/04/22 THERAPEUTIC EXERCISE: to improve flexibility, strength and mobility.   Demonstration, verbal and tactile cues throughout for technique. Nustep L5x19min Squats at counter depth to tolerance x 10  Church pew x 20  Retro steps RLE back x 20  Standing R TKE with GTB 12x3" Rec Bike x 6 min partial revolutions to full towards the end  MODALITIES: Game Ready vasopneumatic compression post session to R knee x 10 min, medium compression, 34 to reduce post-exercise pain and swelling/edema   PATIENT EDUCATION:  Education details: HEP update - patellar glides/mobs and desensitization techniques to reduce hypersensitivity  Person educated: Patient Education method: Explanation, Demonstration, Tactile cues, Verbal cues, and Handouts Education comprehension: verbalized understanding, returned demonstration, verbal cues required, tactile cues required, and needs further education  HOME EXERCISE PROGRAM: Access Code: [email or web address] URL: [email or web address] Date: 10/10/2022 Prepared by: Glenetta Hew  Exercises - Supine Heel Slide with Strap  - 2 x daily - 7 x weekly - 2 sets - 10 reps - 3 sec hold - Heel Toe Raises with Counter Support  - 2 x daily - 7 x weekly - 2 sets - 10 reps - 3 sec hold - Standing Knee Flexion  - 2 x daily - 7 x weekly - 2 sets - 10 reps - 3 sec hold - Standing Hip Abduction with Counter Support  - 2 x daily - 7 x weekly - 2 sets - 10 reps - 2-3 sec hold - Standing Hip Extension with Counter Support  - 2 x daily - 7 x weekly - 2 sets - 10 reps - 2-3 sec hold - Standing March with Counter Support  - 2 x daily - 7 x weekly - 2 sets - 10 reps - 2-3 sec hold - Backward Weight Shift and Opposite Arm Raise with Walker  - 1 x daily - 7 x weekly - 2 sets - 10 reps - Church Pew  - 1 x daily - 7 x weekly - 2 sets - 10 reps - Sitting Knee Extension with Resistance  - 1 x daily - 7 x weekly - 2 sets - 10 reps - Seated Hamstring Curl with Anchored Resistance  - 1 x daily - 7 x weekly - 2 sets - 10 reps - Long Sitting 4 Way Patellar Glide  - 2-3 x  daily - 7 x weekly - 2 sets - 10 reps  Patient Education - Rubbing with Different Textures   ASSESSMENT:  CLINICAL IMPRESSION: Megan Crane feels like her knee is slowly improving but noted some increased stiffness after going out to eat following her last PT visit which lingered into the weekend.  Provided education on hamstring and calf stretches to reduce muscle tightness contributing to stiffness as well as reviewed education on R knee patellar mobilization to help facilitate increased R knee ROM.  She notes increased abnormal sensation and hypersensitivity around her incision and along the anterior knee and shin, therefore provided education and desensitization techniques to help reduce abnormal sensation and hypersensitivity.  Progressed proprioceptive training with SLS weightbearing on RLE with increasing fatigue noted after ~7th rep but patient able to complete 10 reps of star pattern.  Patient encouraged to continue with her current HEP but to let PT know if  any exercises seem to aggravate her knee.  Megan Crane is progressing well toward her goals and will benefit from continued skilled PT to address ongoing pain, ROM and strength deficits to improve mobility and activity tolerance with decreased pain interference.   OBJECTIVE IMPAIRMENTS: Abnormal gait, decreased activity tolerance, decreased balance, decreased endurance, decreased knowledge of condition, decreased knowledge of use of DME, decreased mobility, difficulty walking, decreased ROM, decreased strength, decreased safety awareness, increased edema, increased fascial restrictions, impaired perceived functional ability, increased muscle spasms, impaired flexibility, and pain.   ACTIVITY LIMITATIONS: sitting, standing, squatting, sleeping, stairs, transfers, bed mobility, bathing, toileting, dressing, locomotion level, and caring for others  PARTICIPATION LIMITATIONS: meal prep, cleaning, laundry, driving, shopping, community activity, and  yard work  PERSONAL FACTORS: Fitness, Past/current experiences, Time since onset of injury/illness/exacerbation, and 3+ comorbidities: OA, anxiety, HTN, Raynaud's phenomenon, GERD, B CTS, DVT s/p COVID in 2021, syncope  are also affecting patient's functional outcome.   REHAB POTENTIAL: Excellent  CLINICAL DECISION MAKING: Stable/uncomplicated  EVALUATION COMPLEXITY: Low   GOALS: Goals reviewed with patient? Yes  SHORT TERM GOALS: Target date: 10/12/2022  Patient will be independent with initial HEP. Baseline: pt performing hospital issued HEP Goal status: MET  10/02/22  2.  Patient will demonstrate improved R knee AROM to >/= 5-90 deg to allow for normal gait pattern. Baseline: R knee AROM -15-77, PROM -5-82 Goal status: MET  10/02/22 - R knee AROM -1-100, lacking 8 extension in LAQ  LONG TERM GOALS: Target date: 11/02/2022  Patient will be independent with advanced/ongoing HEP to improve outcomes and carryover.  Baseline:  Goal status: IN PROGRESS  2.  Patient will report at least 75% improvement in R knee pain to improve QOL. Baseline: 5-6/10 Goal status: IN PROGRESS  3.  Patient will demonstrate improved R knee AROM to >/= -2-120 deg to allow for normal gait and stair mechanics. Baseline: R knee AROM -15-77, PROM -5-82 Goal status: IN PROGRESS  4.  Patient will demonstrate improved B LE strength to >/= 4+/5 for improved stability and ease of mobility. Baseline: refer to above LE MMT table Goal status: IN PROGRESS  5.  Patient will be able to ambulate 600' with or w/o LRAD and normal gait pattern without increased pain to access community.  Baseline: antalgic gait with RW Goal status: IN PROGRESS  6. Patient will be able to ascend/descend stairs with 1 HR and reciprocal step pattern safely to access home and community.  Baseline: NT Goal status: IN PROGRESS  7.  Patient will report >/= 30/80 on LEFS to demonstrate improved functional ability. Baseline: 13 / 80  = 16.3 % Goal status: IN PROGRESS  8.  Patient will improve 5x STS time to </= 15 seconds with even weight shift to demonstrate improved functional strength and transfer efficiency . Baseline: 29.60 sec with weight shift to L LE Goal status: IN PROGRESS   9.  Patient will demonstrate decreased TUG time to </= 13.5 sec with or w/o LRAD to decrease risk for falls with transitional mobility Baseline: 23.79 sec with RW Goal status: IN PROGRESS   PLAN:  PT FREQUENCY: 2-3x/week (3x/wk x 2 wks, tapering to 2x/wk for duration of POC)  PT DURATION: 4-6 weeks  PLANNED INTERVENTIONS: Therapeutic exercises, Therapeutic activity, Neuromuscular re-education, Balance training, Gait training, Patient/Family education, Self Care, Joint mobilization, Stair training, DME instructions, Dry Needling, Electrical stimulation, Cryotherapy, Moist heat, Taping, Vasopneumatic device, Ultrasound, Ionotophoresis 4mg /ml Dexamethasone, Manual therapy, and Re-evaluation  PLAN FOR NEXT SESSION: progress R  knee ROM and B LE strengthening - gradually progress to standing activities; review & update HEP PRN; balance training; gait training to normalize gait pattern and work on weaning to [facility] as indicated; MT +/- modalities for R knee pain and R LE edema management; weekly R knee AROM assessment   Marry Guan, PT 10/10/2022, 10:17 AM    Date of referral: 08/10/22 Referring provider: Joen Laura, MD Referring diagnosis? U54.270 (ICD-10-CM) - Status post right partial knee replacement  Treatment diagnosis? (if different than referring diagnosis)  Stiffness of right knee, not elsewhere classified  Acute pain of right knee  Other abnormalities of gait and mobility  Muscle weakness (generalized)  Localized edema  What was this (referring dx) caused by? Surgery (Type: R unicondylar knee replacement) and Arthritis  Nature of Condition: Initial Onset (within last 3 months)   Laterality: Rt  Current  Functional Measure Score: LEFS 13 / 80 = 16.3 %  Objective measurements identify impairments when they are compared to normal values, the uninvolved extremity, and prior level of function.  [x]  Yes  []  No  Objective assessment of functional ability: Moderate functional limitations   Briefly describe symptoms: Current deficits include R knee pain, increased edema in R LE, limited and painful R knee ROM, R>L LE weakness and impaired ADLs, gait and mobility.  How did symptoms start: OA leading to R unicondylar knee replacement on 09/18/22  Average pain intensity:  Last 24 hours: 5-6/10  Past week: 5-6/10  How often does the pt experience symptoms? Constantly  How much have the symptoms interfered with usual daily activities? Quite a bit  How has condition changed since care began at this facility? NA - initial visit  In general, how is the patients overall health? Very Good

## 2022-10-12 ENCOUNTER — Encounter: Payer: Self-pay | Admitting: Physical Therapy

## 2022-10-12 ENCOUNTER — Ambulatory Visit: Payer: Medicare Other | Admitting: Physical Therapy

## 2022-10-12 DIAGNOSIS — R2689 Other abnormalities of gait and mobility: Secondary | ICD-10-CM

## 2022-10-12 DIAGNOSIS — M6281 Muscle weakness (generalized): Secondary | ICD-10-CM | POA: Diagnosis not present

## 2022-10-12 DIAGNOSIS — M25661 Stiffness of right knee, not elsewhere classified: Secondary | ICD-10-CM | POA: Diagnosis not present

## 2022-10-12 DIAGNOSIS — M25561 Pain in right knee: Secondary | ICD-10-CM | POA: Diagnosis not present

## 2022-10-12 DIAGNOSIS — R600 Localized edema: Secondary | ICD-10-CM | POA: Diagnosis not present

## 2022-10-12 NOTE — Therapy (Signed)
OUTPATIENT PHYSICAL THERAPY TREATMENT      Patient Name: Megan Crane MRN: [id number] DOB:[birth year], 72 y.o., female Today's Date: 10/12/2022   END OF SESSION:  PT End of Session - 10/12/22 1017     Visit Number 9    Date for PT Re-Evaluation 11/02/22    Authorization Type [employer] Medicare    Authorization Time Period 09/21/22-11/02/22    Authorization - Visit Number 9    Authorization - Number of Visits 16    Progress Note Due on Visit 15   MD PN on visit #5 - 10/02/22   PT Start Time 1017    PT Stop Time 1115    PT Time Calculation (min) 58 min    Activity Tolerance Patient tolerated treatment well    Behavior During Therapy [facility] for tasks assessed/performed;Anxious                    Past Medical History:  Diagnosis Date   Anxiety disorder    hx vasovagal responses   Arthritis    Cancer (HCC)    Deep vein thrombosis (DVT) (HCC)    hx of in left left after covid in 2021   Headache(784.0)    History of kidney stones    Hydronephrosis, right    Hyperlipidemia    Hypertension    Nephrolithiasis    Pneumonia    Positive nasal culture for methicillin resistant Staphylococcus aureus    Right ureteral stone    Thrombophlebitis of leg, left, superficial 11/17/2020   Urinary incontinence    sees Dr. McDiarmid    Vasovagal syncope 08/02/2022   Past Surgical History:  Procedure Laterality Date   BLADDER REPAIR     COLONOSCOPY  12/28/2015   per Dr. Adela Lank, serrated polyps and diverticula, repeat in 3 yrs    CYSTOSCOPY  06/09/2011   Procedure: CYSTOSCOPY;  Surgeon: Martina Sinner, MD;  Location: [facility];  Service: Urology;  Laterality: N/A;   CYSTOSCOPY W/ URETERAL STENT PLACEMENT  02/03/2014   Procedure: CYSTOSCOPY WITH  RIGHT RETROGRADE PYELOGRAM/ RIGHT URETERAL STENT PLACEMENT;  Surgeon: Anner Crete, MD;  Location: [facility];  Service: Urology;;   CYSTOSCOPY WITH BIOPSY  02/03/2014   Procedure: CYSTOSCOPY WITH BIOPSY;  Surgeon:  Anner Crete, MD;  Location: [facility];  Service: Urology;;   PARTIAL KNEE ARTHROPLASTY Right 09/18/2022   Procedure: UNICOMPARTMENTAL KNEE;  Surgeon: Joen Laura, MD;  Location: [facility];  Service: Orthopedics;  Laterality: Right;   PILONIDAL CYST EXCISION  1974   RECTOCELE REPAIR  06/09/2011   Procedure: POSTERIOR REPAIR (RECTOCELE);  Surgeon: Martina Sinner, MD;  Location: [facility];  Service: Urology;  Laterality: N/A;  Xenform graft 6x10   RIGHT URETEROSCOPIC STONE EXTRACTION / STENT PLACEMENT  03/13/2000   URETEROSCOPY Right 02/03/2014   Procedure: URETEROSCOPY WITH MANAGEMENT OF URETERAL STRICTURE;  Surgeon: Anner Crete, MD;  Location: [facility];  Service: Urology;  Laterality: Right;   VAGINAL HYSTERECTOMY  06/09/2011   Procedure: HYSTERECTOMY VAGINAL;  Surgeon: Mickel Baas, MD;  Location: [facility];  Service: Gynecology;  Laterality: N/A;   VAGINAL PROLAPSE REPAIR  06/09/2011   Procedure: VAGINAL VAULT SUSPENSION;  Surgeon: Martina Sinner, MD;  Location: [facility];  Service: Urology;  Laterality: N/A;   VEIN LIGATION AND STRIPPING     LEFT LEG   Patient Active Problem List   Diagnosis Date Noted   S/P right unicompartmental knee replacement 09/18/2022  IC (interstitial cystitis) 09/04/2022   Pelvic pressure in female 08/22/2022   Visit for pre-operative examination 08/07/2022   Vasovagal syncope 08/02/2022   Nasal dryness 06/30/2022   Precancerous skin lesion 06/30/2022   Bilateral carpal tunnel syndrome 03/02/2022   Swelling of both hands 03/02/2022   Atypical chest pain 03/02/2022   OAB (overactive bladder) 11/23/2021   Corn of foot 11/23/2021   Atrophic vaginitis 11/23/2021   COVID-19 virus infection 11/17/2020   Raynaud's phenomenon without gangrene 11/17/2020   Primary osteoarthritis involving multiple joints 11/24/2019   GERD (gastroesophageal reflux disease) 12/26/2016   Edema 05/29/2014   Nasal colonization with  methicillin-resistant Staphylococcus aureus 01/22/2014   Varicose veins of bilateral lower extremities with other complications 12/11/2012   Allergic rhinitis 11/29/2009   MOTION SICKNESS 11/29/2009   Hyperlipidemia 08/31/2008   Anxiety state 08/31/2008   Primary hypertension 08/31/2008   PILONIDAL CYST 08/31/2008   HEADACHE 08/31/2008   URINARY INCONTINENCE 08/31/2008   NEPHROLITHIASIS, HX OF 08/31/2008   POSTNASAL DRIP SYNDROME 05/28/2006    PCP: Nelwyn Salisbury, MD   REFERRING PROVIDER: Joen Laura, MD   REFERRING DIAG: [phone] (ICD-10-CM) - Status post right partial knee replacement   THERAPY DIAG:  No diagnosis found.  RATIONALE FOR EVALUATION AND TREATMENT: Rehabilitation  ONSET DATE: 09/18/2022 - R unicompartmental knee replacement  NEXT MD VISIT: 10/31/2022   SUBJECTIVE:                                                                                                                                                                                                         SUBJECTIVE STATEMENT: Pt reports only mild ache going down her lower leg today.   PAIN: Are you having pain? Yes: NPRS scale: 1.5/10 Pain location: R anterior lower leg Pain description: achy Aggravating factors: prolonged sitting and first getting up Relieving factors: walking  PERTINENT HISTORY:  OA, anxiety, HTN, Raynaud's phenomenon, GERD, B CTS, DVT s/p COVID in 2021, syncope  PRECAUTIONS: None  RED FLAGS: None  WEIGHT BEARING RESTRICTIONS: No  FALLS:  Has patient fallen in last 6 months? 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 Go back to work part-time."   OBJECTIVE: (objective  measures completed at initial evaluation unless otherwise dated)  DIAGNOSTIC FINDINGS:  09/18/22 - DG R knee:  IMPRESSION: Medial compartment hemiarthroplasty without immediate postoperative complication.  PATIENT SURVEYS:  LEFS 13 / 80 = 16.3 %  COGNITION: Overall cognitive status: Within functional limits for tasks assessed    SENSATION: WFL  EDEMA:  Circumferential: 44 cm at joint line, 53 cm (5 cm above joint line)  MUSCLE LENGTH: Hamstrings: Mild/mod tight R>L ITB: Mild/mod tight R>L Piriformis: Mild tight R>L Hip flexors: Mild tight R>L Quads: Mod tight R>L Heelcord: NT  POSTURE:  No Significant postural limitations  PALPATION: R knee swollen and slightly warm to touch with mild erythema present.  Decreased R patellar mobility in all directions.  LOWER EXTREMITY ROM:  Active ROM Right eval Left eval R 09/27/22 R 09/29/22 R 10/02/22 R 10/06/22 R 10/12/22  Knee flexion 77 127 85 91 100 105 104  Knee extension -15 LAQ +2 -10 LAQ -9 LAQ -8 LAQ -7 LAQ -4 LAQ   Passive ROM Right eval R 09/27/22 R 10/02/22 R 10/12/22  Knee flexion 82 85    Knee extension -5 0 -1 0   LOWER EXTREMITY MMT:  MMT Right eval Left   eval  Hip flexion 4+ 5  Hip extension 4 4  Hip abduction 4- 4  Hip adduction 4 4+  Hip internal rotation 4 4+  Hip external rotation 4 4  Knee flexion 4 5  Knee extension 4  5  Ankle dorsiflexion 4 4  Ankle plantarflexion    Ankle inversion    Ankle eversion     (Blank rows = not tested)  FUNCTIONAL TESTS:  5 times sit to stand: 29.60 sec with weight shift to L LE Timed up and go (TUG): 23.79 sec with RW  GAIT: Distance walked: Clinic distances Assistive device utilized: Environmental consultant - 2 wheeled Level of assistance: Modified independence Gait pattern: step through pattern and decreased hip/knee flexion- Right Comments: Ambulates with stiff R knee but good  heel strike on weight acceptance   TODAY'S TREATMENT:   10/12/22 THERAPEUTIC EXERCISE: to improve flexibility, strength and mobility.  Demonstration, verbal and tactile cues throughout for technique.  Rec Bike - L1 x 7 min Seated GTB R knee flexion/HS curls x 10 Seated GTB R knee extension/LAQ 2 x 10 Standing R/L GTB 4-way SLR x 10 each direction bil  THERAPEUTIC ACTIVITIES: Answered all of patient's questions related to mobility and ADL performance in home   MODALITIES: Game Ready vasopneumatic compression post session to R knee x 10 min, medium compression, 34 to reduce post-exercise pain and swelling/edema   10/10/22 THERAPEUTIC EXERCISE: to improve flexibility, strength and mobility.  Demonstration, verbal and tactile cues throughout for technique.  Rec Bike - L1 x 7 min R standing runner's gastroc stretch 2 x 30" R seated hip hinge HS stretch + strap for gastroc stretch 2 x 30"  SELF CARE:  Instructed pt in desensitization techniques to reduce hypersensitivity in R anterior knee.  MANUAL THERAPY: To promote normalized muscle tension, improved flexibility, improved joint mobility, increased ROM, and reduced pain. R patellar glides/mobs - all directions - Pt instructed in self-performance for home  NEUROMUSCULAR RE-EDUCATION: To improve balance, proprioception, and coordination. R SLS + 5-way star reach to colored dots x 10, cane for balance  MODALITIES: Game Ready vasopneumatic compression post session to R knee x 10 min, medium compression, 34 to reduce post-exercise pain and swelling/edema   10/06/22 THERAPEUTIC EXERCISE: to improve flexibility, strength and mobility.  Demonstration, verbal and tactile cues throughout for technique. Bike partial rev at first then full rev for x 5 min Fitter leg press x 10 RLE Squats x 10 - burning in R knee at the end Single leg deadlift x 10  Retro gait 4x down and back counter Seated R HS curls GTB x 10  Seated R LAQ GTB x 10   Measured R knee ROM  MANUAL THERAPY: To promote improved flexibility, improved joint mobility, increased ROM, and reduced pain.  R Patellar mobs - all directions Education on patellar mobs  MODALITIES: Game Ready vasopneumatic compression post session to R knee x 10 min, medium compression, 34 to reduce post-exercise pain and swelling/edema   PATIENT EDUCATION:  Education details: HEP progression - GTB resistance added to standing hip exercises   Person educated: Patient Education method: Explanation, Demonstration, Tactile cues, Verbal cues, and Handouts Education comprehension: verbalized understanding, returned demonstration, verbal cues required, tactile cues required, and needs further education  HOME EXERCISE PROGRAM: Access Code: [email or web address] URL: [email or web address] Date: 10/12/2022 Prepared by: Glenetta Hew  Exercises - Supine Heel Slide with Strap  - 2 x daily - 7 x weekly - 2 sets - 10 reps - 3 sec hold - Heel Toe Raises with Counter Support  - 2 x daily - 7 x weekly - 2 sets - 10 reps - 3 sec hold - Standing Knee Flexion  - 2 x daily - 7 x weekly - 2 sets - 10 reps - 3 sec hold - Backward Weight Shift and Opposite Arm Raise with Walker  - 1 x daily - 7 x weekly - 2 sets - 10 reps - [place]  - 1 x daily - 7 x weekly - 2 sets - 10 reps - Sitting Knee Extension with Resistance  - 1 x daily - 7 x weekly - 2 sets - 10 reps - Seated Hamstring Curl with Anchored Resistance  - 1 x daily - 7 x weekly - 2 sets - 10 reps - Long Sitting 4 Way Patellar Glide  - 2-3 x daily - 7 x weekly - 2 sets - 10 reps - Standing Hip Flexion with Anchored Resistance and Chair Support  - 1 x daily - 3-4 x weekly - 2 sets - 10 reps - 3 sec hold - Standing Hip Adduction with Anchored Resistance  - 1 x daily - 3-4 x weekly - 2 sets - 10 reps - 3 sec hold - Standing Hip Extension with Anchored Resistance  - 1 x daily - 3-4 x weekly - 2 sets - 10 reps - 3 sec hold - Standing Hip  Abduction with Anchored Resistance  - 1 x daily - 3-4 x weekly - 2 sets - 10 reps - 3 sec hold  Patient Education - Rubbing with Different Textures   ASSESSMENT:  CLINICAL IMPRESSION: Mersadies remains anxious that she is going to do something that may hurt her knee or delay her recovery, therefore answered all of her questions and provided reassurance on her progress with PT. She continued to demonstrate improving quad strength/control with R knee extension improved to -4 in LAQ and continues to demonstrate full extension when R LE supported. Progressed proximal LE strengthening and balance training with addition of GTB to standing hip exercises, as well as reviewed recently added GTB resisted knee flexion and extension. Demonstration provided for set-up at home with HEP handouts provided, clarifying that new exercises were to take the place of previous standing hip exercises.  Lurena Joiner  is progressing well toward her goals and will benefit from continued skilled PT to address ongoing pain, ROM and strength deficits to improve mobility and activity tolerance with decreased pain interference.   OBJECTIVE IMPAIRMENTS: Abnormal gait, decreased activity tolerance, decreased balance, decreased endurance, decreased knowledge of condition, decreased knowledge of use of DME, decreased mobility, difficulty walking, decreased ROM, decreased strength, decreased safety awareness, increased edema, increased fascial restrictions, impaired perceived functional ability, increased muscle spasms, impaired flexibility, and pain.   ACTIVITY LIMITATIONS: sitting, standing, squatting, sleeping, stairs, transfers, bed mobility, bathing, toileting, dressing, locomotion level, and caring for others  PARTICIPATION LIMITATIONS: meal prep, cleaning, laundry, driving, shopping, community activity, and yard work  PERSONAL FACTORS: Fitness, Past/current experiences, Time since onset of injury/illness/exacerbation, and 3+  comorbidities: OA, anxiety, HTN, Raynaud's phenomenon, GERD, B CTS, DVT s/p COVID in 2021, syncope  are also affecting patient's functional outcome.   REHAB POTENTIAL: Excellent  CLINICAL DECISION MAKING: Stable/uncomplicated  EVALUATION COMPLEXITY: Low   GOALS: Goals reviewed with patient? Yes  SHORT TERM GOALS: Target date: 10/12/2022  Patient will be independent with initial HEP. Baseline: pt performing hospital issued HEP Goal status: MET  10/02/22  2.  Patient will demonstrate improved R knee AROM to >/= 5-90 deg to allow for normal gait pattern. Baseline: R knee AROM -15-77, PROM -5-82 Goal status: MET  10/02/22 - R knee AROM -1-100, lacking 8 extension in LAQ  LONG TERM GOALS: Target date: 11/02/2022  Patient will be independent with advanced/ongoing HEP to improve outcomes and carryover.  Baseline:  Goal status: IN PROGRESS  10/12/22 - HEP updated today  2.  Patient will report at least 75% improvement in R knee pain to improve QOL. Baseline: 5-6/10 Goal status: IN PROGRESS  10/12/22 - current reported pain 1.5/10   3.  Patient will demonstrate improved R knee AROM to >/= -2-120 deg to allow for normal gait and stair mechanics. Baseline: R knee AROM -15-77, PROM -5-82 Goal status: IN PROGRESS  10/12/22 - R knee AROM 0-104, lacking 4 extension in LAQ  4.  Patient will demonstrate improved B LE strength to >/= 4+/5 for improved stability and ease of mobility. Baseline: refer to above LE MMT table Goal status: IN PROGRESS  5.  Patient will be able to ambulate 600' with or w/o LRAD and normal gait pattern without increased pain to access community.  Baseline: antalgic gait with RW Goal status: IN PROGRESS  6. Patient will be able to ascend/descend stairs with 1 HR and reciprocal step pattern safely to access home and community.  Baseline: NT Goal status: IN PROGRESS  7.  Patient will report >/= 30/80 on LEFS to demonstrate improved functional ability. Baseline:  13 / 80 = 16.3 % Goal status: IN PROGRESS  8.  Patient will improve 5x STS time to </= 15 seconds with even weight shift to demonstrate improved functional strength and transfer efficiency . Baseline: 29.60 sec with weight shift to L LE Goal status: IN PROGRESS   9.  Patient will demonstrate decreased TUG time to </= 13.5 sec with or w/o LRAD to decrease risk for falls with transitional mobility Baseline: 23.79 sec with RW Goal status: IN PROGRESS   PLAN:  PT FREQUENCY: 2-3x/week (3x/wk x 2 wks, tapering to 2x/wk for duration of POC)  PT DURATION: 4-6 weeks  PLANNED INTERVENTIONS: Therapeutic exercises, Therapeutic activity, Neuromuscular re-education, Balance training, Gait training, Patient/Family education, Self Care, Joint mobilization, Stair training, DME instructions, Dry Needling, Electrical stimulation, Cryotherapy, Moist heat, Taping,  Vasopneumatic device, Ultrasound, Ionotophoresis 4mg /ml Dexamethasone, Manual therapy, and Re-evaluation  PLAN FOR NEXT SESSION: progress R knee ROM and B LE strengthening - gradually progress to standing activities; review & update HEP PRN; balance training; gait training to normalize gait pattern and work on weaning to [facility] as indicated; MT +/- modalities for R knee pain and R LE edema management; weekly R knee AROM assessment   Marry Guan, PT 10/12/2022, 1:07 PM    Date of referral: 08/10/22 Referring provider: Joen Laura, MD Referring diagnosis? Z61.096 (ICD-10-CM) - Status post right partial knee replacement  Treatment diagnosis? (if different than referring diagnosis)  No diagnosis found.  What was this (referring dx) caused by? Surgery (Type: R unicondylar knee replacement) and Arthritis  Nature of Condition: Initial Onset (within last 3 months)   Laterality: Rt  Current Functional Measure Score: LEFS 13 / 80 = 16.3 %  Objective measurements identify impairments when they are compared to normal values, the uninvolved  extremity, and prior level of function.  [x]  Yes  []  No  Objective assessment of functional ability: Moderate functional limitations   Briefly describe symptoms: Current deficits include R knee pain, increased edema in R LE, limited and painful R knee ROM, R>L LE weakness and impaired ADLs, gait and mobility.  How did symptoms start: OA leading to R unicondylar knee replacement on 09/18/22  Average pain intensity:  Last 24 hours: 5-6/10  Past week: 5-6/10  How often does the pt experience symptoms? Constantly  How much have the symptoms interfered with usual daily activities? Quite a bit  How has condition changed since care began at this facility? NA - initial visit  In general, how is the patients overall health? Very Good

## 2022-10-17 ENCOUNTER — Ambulatory Visit: Payer: Medicare Other

## 2022-10-17 DIAGNOSIS — R600 Localized edema: Secondary | ICD-10-CM

## 2022-10-17 DIAGNOSIS — M25561 Pain in right knee: Secondary | ICD-10-CM | POA: Diagnosis not present

## 2022-10-17 DIAGNOSIS — R2689 Other abnormalities of gait and mobility: Secondary | ICD-10-CM

## 2022-10-17 DIAGNOSIS — M6281 Muscle weakness (generalized): Secondary | ICD-10-CM

## 2022-10-17 DIAGNOSIS — L821 Other seborrheic keratosis: Secondary | ICD-10-CM | POA: Diagnosis not present

## 2022-10-17 DIAGNOSIS — M25661 Stiffness of right knee, not elsewhere classified: Secondary | ICD-10-CM

## 2022-10-17 DIAGNOSIS — L570 Actinic keratosis: Secondary | ICD-10-CM | POA: Diagnosis not present

## 2022-10-17 DIAGNOSIS — Z85828 Personal history of other malignant neoplasm of skin: Secondary | ICD-10-CM | POA: Diagnosis not present

## 2022-10-17 NOTE — Therapy (Signed)
OUTPATIENT PHYSICAL THERAPY TREATMENT      Patient Name: Megan Crane MRN: [id number] DOB:06/21/1950, 72 y.o., female Today's Date: 10/17/2022   END OF SESSION:  PT End of Session - 10/17/22 0849     Visit Number 10    Date for PT Re-Evaluation 11/02/22    Authorization Type [employer]    Authorization Time Period 09/21/22-11/02/22    Authorization - Visit Number 10    Authorization - Number of Visits 16    Progress Note Due on Visit 15   MD PN on visit #5 - 10/02/22   PT Start Time 0844    PT Stop Time 0939    PT Time Calculation (min) 55 min    Activity Tolerance Patient tolerated treatment well    Behavior During Therapy [facility] for tasks assessed/performed;Anxious                     Past Medical History:  Diagnosis Date   Anxiety disorder    hx vasovagal responses   Arthritis    Cancer (HCC)    Deep vein thrombosis (DVT) (HCC)    hx of in left left after covid in 2021   Headache(784.0)    History of kidney stones    Hydronephrosis, right    Hyperlipidemia    Hypertension    Nephrolithiasis    Pneumonia    Positive nasal culture for methicillin resistant Staphylococcus aureus    Right ureteral stone    Thrombophlebitis of leg, left, superficial 11/17/2020   Urinary incontinence    sees Dr. McDiarmid    Vasovagal syncope 08/02/2022   Past Surgical History:  Procedure Laterality Date   BLADDER REPAIR     COLONOSCOPY  12/28/2015   per Dr. Adela Lank, serrated polyps and diverticula, repeat in 3 yrs    CYSTOSCOPY  06/09/2011   Procedure: CYSTOSCOPY;  Surgeon: Martina Sinner, MD;  Location: [facility];  Service: Urology;  Laterality: N/A;   CYSTOSCOPY W/ URETERAL STENT PLACEMENT  02/03/2014   Procedure: CYSTOSCOPY WITH  RIGHT RETROGRADE PYELOGRAM/ RIGHT URETERAL STENT PLACEMENT;  Surgeon: Anner Crete, MD;  Location[facility];  Service: Urology;;   CYSTOSCOPY WITH BIOPSY  02/03/2014   Procedure: CYSTOSCOPY WITH BIOPSY;  Surgeon:  Anner Crete, MD;  Location[facility]  Service: Urology;;   PARTIAL KNEE ARTHROPLASTY Right 09/18/2022   Procedure: UNICOMPARTMENTAL KNEE;  Surgeon: Joen Laura, MD;  Location: [facility];  Service: Orthopedics;  Laterality: Right;   PILONIDAL CYST EXCISION  1974   RECTOCELE REPAIR  06/09/2011   Procedure: POSTERIOR REPAIR (RECTOCELE);  Surgeon: Martina Sinner, MD;  Location: [facility];  Service: Urology;  Laterality: N/A;  Xenform graft 6x10   RIGHT URETEROSCOPIC STONE EXTRACTION / STENT PLACEMENT  03/13/2000   URETEROSCOPY Right 02/03/2014   Procedure: URETEROSCOPY WITH MANAGEMENT OF URETERAL STRICTURE;  Surgeon: Anner Crete, MD;  Location: [facility];  Service: Urology;  Laterality: Right;   VAGINAL HYSTERECTOMY  06/09/2011   Procedure: HYSTERECTOMY VAGINAL;  Surgeon: Mickel Baas, MD;  Location: [facility];  Service: Gynecology;  Laterality: N/A;   VAGINAL PROLAPSE REPAIR  06/09/2011   Procedure: VAGINAL VAULT SUSPENSION;  Surgeon: Martina Sinner, MD;  Location: [facility];  Service: Urology;  Laterality: N/A;   VEIN LIGATION AND STRIPPING     LEFT LEG   Patient Active Problem List   Diagnosis Date Noted   S/P right unicompartmental knee replacement 09/18/2022  IC (interstitial cystitis) 09/04/2022   Pelvic pressure in female 08/22/2022   Visit for pre-operative examination 08/07/2022   Vasovagal syncope 08/02/2022   Nasal dryness 06/30/2022   Precancerous skin lesion 06/30/2022   Bilateral carpal tunnel syndrome 03/02/2022   Swelling of both hands 03/02/2022   Atypical chest pain 03/02/2022   OAB (overactive bladder) 11/23/2021   Corn of foot 11/23/2021   Atrophic vaginitis 11/23/2021   COVID-19 virus infection 11/17/2020   Raynaud's phenomenon without gangrene 11/17/2020   Primary osteoarthritis involving multiple joints 11/24/2019   GERD (gastroesophageal reflux disease) 12/26/2016   Edema 05/29/2014   Nasal colonization with  methicillin-resistant Staphylococcus aureus 01/22/2014   Varicose veins of bilateral lower extremities with other complications 12/11/2012   Allergic rhinitis 11/29/2009   MOTION SICKNESS 11/29/2009   Hyperlipidemia 08/31/2008   Anxiety state 08/31/2008   Primary hypertension 08/31/2008   PILONIDAL CYST 08/31/2008   HEADACHE 08/31/2008   URINARY INCONTINENCE 08/31/2008   NEPHROLITHIASIS, HX OF 08/31/2008   POSTNASAL DRIP SYNDROME 05/28/2006    PCP: Nelwyn Salisbury, MD   REFERRING PROVIDER: Joen Laura, MD   REFERRING DIAG: [phone] (ICD-10-CM) - Status post right partial knee replacement   THERAPY DIAG:  Stiffness of right knee, not elsewhere classified  Acute pain of right knee  Other abnormalities of gait and mobility  Muscle weakness (generalized)  Localized edema  RATIONALE FOR EVALUATION AND TREATMENT: Rehabilitation  ONSET DATE: 09/18/2022 - R unicompartmental knee replacement  NEXT MD VISIT: 10/31/2022   SUBJECTIVE:                                                                                                                                                                                                         SUBJECTIVE STATEMENT: Pt feels frustrated about progress, feels like she should be further along.  PAIN: Are you having pain? Yes: NPRS scale: 2/10 Pain location: R anterior lower leg Pain description: achy Aggravating factors: prolonged sitting and first getting up Relieving factors: walking  PERTINENT HISTORY:  OA, anxiety, HTN, Raynaud's phenomenon, GERD, B CTS, DVT s/p COVID in 2021, syncope  PRECAUTIONS: None  RED FLAGS: None  WEIGHT BEARING RESTRICTIONS: No  FALLS:  Has patient fallen in last 6 months? No  LIVING ENVIRONMENT: Lives with: lives with their spouse Lives in: House/apartment Stairs: Yes: External: 1+2 steps; none Has following equipment at home: Single point cane, Walker - 2 wheeled, Tour manager, and bed side  commode  OCCUPATION: Retired  PLOF: Independent and Leisure: reading, quilting, Training and development officer, yardwork, Geophysicist/field seismologist at [employer]  PATIENT GOALS: "Be able to go up/down steps in motor home so we can travel. Go back to work part-time."   OBJECTIVE: (objective measures completed at initial evaluation unless otherwise dated)  DIAGNOSTIC FINDINGS:  09/18/22 - DG R knee:  IMPRESSION: Medial compartment hemiarthroplasty without immediate postoperative complication.  PATIENT SURVEYS:  LEFS 13 / 80 = 16.3 %  COGNITION: Overall cognitive status: Within functional limits for tasks assessed    SENSATION: WFL  EDEMA:  Circumferential: 44 cm at joint line, 53 cm (5 cm above joint line)  MUSCLE LENGTH: Hamstrings: Mild/mod tight R>L ITB: Mild/mod tight R>L Piriformis: Mild tight R>L Hip flexors: Mild tight R>L Quads: Mod tight R>L Heelcord: NT  POSTURE:  No Significant postural limitations  PALPATION: R knee swollen and slightly warm to touch with mild erythema present.  Decreased R patellar mobility in all directions.  LOWER EXTREMITY ROM:  Active ROM Right eval Left eval R 09/27/22 R 09/29/22 R 10/02/22 R 10/06/22 R 10/12/22  Knee flexion 77 127 85 91 100 105 104  Knee extension -15 LAQ +2 -10 LAQ -9 LAQ -8 LAQ -7 LAQ -4 LAQ   Passive ROM Right eval R 09/27/22 R 10/02/22 R 10/12/22  Knee flexion 82 85    Knee extension -5 0 -1 0   LOWER EXTREMITY MMT:  MMT Right eval Left   eval  Hip flexion 4+ 5  Hip extension 4 4  Hip abduction 4- 4  Hip adduction 4 4+  Hip internal rotation 4 4+  Hip external rotation 4 4  Knee flexion 4 5  Knee extension 4  5  Ankle dorsiflexion 4 4  Ankle plantarflexion    Ankle inversion    Ankle eversion     (Blank rows = not tested)  FUNCTIONAL TESTS:  5 times sit to stand: 29.60 sec with weight shift to L LE Timed up and go (TUG): 23.79 sec with RW  GAIT: Distance walked: Clinic distances Assistive device utilized: Environmental consultant - 2  wheeled Level of assistance: Modified independence Gait pattern: step through pattern and decreased hip/knee flexion- Right Comments: Ambulates with stiff R knee but good heel strike on weight acceptance   TODAY'S TREATMENT:  10/17/22 THERAPEUTIC EXERCISE: to improve flexibility, strength and mobility.  Demonstration, verbal and tactile cues throughout for technique.  Nustep L5x64min Bike L1x3 min - seat 7 Functional squat x 10 touching floor R single leg deadlift 2 x 10 touching 9' stool Clock balance 5x R/L 1/2 circle Standing R GTB 4-way SLR x 10 each direction bil - for review  MODALITIES: Game Ready vasopneumatic compression post session to R knee x 10 min, medium compression, 34 to reduce post-exercise pain and swelling/edema  10/12/22 THERAPEUTIC EXERCISE: to improve flexibility, strength and mobility.  Demonstration, verbal and tactile cues throughout for technique.  Rec Bike - L1 x 7 min Seated GTB R knee flexion/HS curls x 10 Seated GTB R knee extension/LAQ 2 x 10 Standing R/L GTB 4-way SLR x 10 each direction bil  THERAPEUTIC ACTIVITIES: Answered all of patient's questions related to mobility and ADL performance in home   MODALITIES: Game Ready vasopneumatic compression post session to R knee x 10 min, medium compression, 34 to reduce post-exercise pain and swelling/edema   10/10/22 THERAPEUTIC EXERCISE: to improve flexibility, strength and mobility.  Demonstration, verbal and tactile cues throughout for technique.  Rec Bike - L1 x 7 min R standing runner's gastroc stretch 2 x 30" R seated hip hinge HS stretch + strap for  gastroc stretch 2 x 30"  SELF CARE:  Instructed pt in desensitization techniques to reduce hypersensitivity in R anterior knee.  MANUAL THERAPY: To promote normalized muscle tension, improved flexibility, improved joint mobility, increased ROM, and reduced pain. R patellar glides/mobs - all directions - Pt instructed in self-performance for  home  NEUROMUSCULAR RE-EDUCATION: To improve balance, proprioception, and coordination. R SLS + 5-way star reach to colored dots x 10, cane for balance  MODALITIES: Game Ready vasopneumatic compression post session to R knee x 10 min, medium compression, 34 to reduce post-exercise pain and swelling/edema   10/06/22 THERAPEUTIC EXERCISE: to improve flexibility, strength and mobility.  Demonstration, verbal and tactile cues throughout for technique. Bike partial rev at first then full rev for x 5 min Fitter leg press x 10 RLE Squats x 10 - burning in R knee at the end Single leg deadlift x 10  Retro gait 4x down and back counter Seated R HS curls GTB x 10  Seated R LAQ GTB x 10  Measured R knee ROM  MANUAL THERAPY: To promote improved flexibility, improved joint mobility, increased ROM, and reduced pain.  R Patellar mobs - all directions Education on patellar mobs  MODALITIES: Game Ready vasopneumatic compression post session to R knee x 10 min, medium compression, 34 to reduce post-exercise pain and swelling/edema   PATIENT EDUCATION:  Education details: HEP progression - GTB resistance added to standing hip exercises   Person educated: Patient Education method: Explanation, Demonstration, Tactile cues, Verbal cues, and Handouts Education comprehension: verbalized understanding, returned demonstration, verbal cues required, tactile cues required, and needs further education  HOME EXERCISE PROGRAM: Access Code: [email or web address] URL: [email or web address] Date: 10/12/2022 Prepared by: Glenetta Hew  Exercises - Supine Heel Slide with Strap  - 2 x daily - 7 x weekly - 2 sets - 10 reps - 3 sec hold - Heel Toe Raises with Counter Support  - 2 x daily - 7 x weekly - 2 sets - 10 reps - 3 sec hold - Standing Knee Flexion  - 2 x daily - 7 x weekly - 2 sets - 10 reps - 3 sec hold - Backward Weight Shift and Opposite Arm Raise with Walker  - 1 x daily - 7 x weekly - 2 sets - 10  reps - [place]  - 1 x daily - 7 x weekly - 2 sets - 10 reps - Sitting Knee Extension with Resistance  - 1 x daily - 7 x weekly - 2 sets - 10 reps - Seated Hamstring Curl with Anchored Resistance  - 1 x daily - 7 x weekly - 2 sets - 10 reps - Long Sitting 4 Way Patellar Glide  - 2-3 x daily - 7 x weekly - 2 sets - 10 reps - Standing Hip Flexion with Anchored Resistance and Chair Support  - 1 x daily - 3-4 x weekly - 2 sets - 10 reps - 3 sec hold - Standing Hip Adduction with Anchored Resistance  - 1 x daily - 3-4 x weekly - 2 sets - 10 reps - 3 sec hold - Standing Hip Extension with Anchored Resistance  - 1 x daily - 3-4 x weekly - 2 sets - 10 reps - 3 sec hold - Standing Hip Abduction with Anchored Resistance  - 1 x daily - 3-4 x weekly - 2 sets - 10 reps - 3 sec hold  Patient Education - Rubbing with Different Textures   ASSESSMENT:  CLINICAL IMPRESSION: Pt shows  good tolerance for exercise. Worked on proprioceptive exercises and Wb strengthening. She wanted to review the 4 way hip exercises today so we reviewed these. Overall good demonstration of exercises, just some cues required for posture with the standing hip exercises. Rosezella is progressing well toward her goals and will benefit from continued skilled PT to address ongoing pain, ROM and strength deficits to improve mobility and activity tolerance with decreased pain interference.   OBJECTIVE IMPAIRMENTS: Abnormal gait, decreased activity tolerance, decreased balance, decreased endurance, decreased knowledge of condition, decreased knowledge of use of DME, decreased mobility, difficulty walking, decreased ROM, decreased strength, decreased safety awareness, increased edema, increased fascial restrictions, impaired perceived functional ability, increased muscle spasms, impaired flexibility, and pain.   ACTIVITY LIMITATIONS: sitting, standing, squatting, sleeping, stairs, transfers, bed mobility, bathing, toileting, dressing,  locomotion level, and caring for others  PARTICIPATION LIMITATIONS: meal prep, cleaning, laundry, driving, shopping, community activity, and yard work  PERSONAL FACTORS: Fitness, Past/current experiences, Time since onset of injury/illness/exacerbation, and 3+ comorbidities: OA, anxiety, HTN, Raynaud's phenomenon, GERD, B CTS, DVT s/p COVID in 2021, syncope  are also affecting patient's functional outcome.   REHAB POTENTIAL: Excellent  CLINICAL DECISION MAKING: Stable/uncomplicated  EVALUATION COMPLEXITY: Low   GOALS: Goals reviewed with patient? Yes  SHORT TERM GOALS: Target date: 10/12/2022  Patient will be independent with initial HEP. Baseline: pt performing hospital issued HEP Goal status: MET  10/02/22  2.  Patient will demonstrate improved R knee AROM to >/= 5-90 deg to allow for normal gait pattern. Baseline: R knee AROM -15-77, PROM -5-82 Goal status: MET  10/02/22 - R knee AROM -1-100, lacking 8 extension in LAQ  LONG TERM GOALS: Target date: 11/02/2022  Patient will be independent with advanced/ongoing HEP to improve outcomes and carryover.  Baseline:  Goal status: IN PROGRESS  10/12/22 - HEP updated today  2.  Patient will report at least 75% improvement in R knee pain to improve QOL. Baseline: 5-6/10 Goal status: IN PROGRESS  10/12/22 - current reported pain 1.5/10   3.  Patient will demonstrate improved R knee AROM to >/= -2-120 deg to allow for normal gait and stair mechanics. Baseline: R knee AROM -15-77, PROM -5-82 Goal status: IN PROGRESS  10/12/22 - R knee AROM 0-104, lacking 4 extension in LAQ  4.  Patient will demonstrate improved B LE strength to >/= 4+/5 for improved stability and ease of mobility. Baseline: refer to above LE MMT table Goal status: IN PROGRESS  5.  Patient will be able to ambulate 600' with or w/o LRAD and normal gait pattern without increased pain to access community.  Baseline: antalgic gait with RW Goal status: IN  PROGRESS  6. Patient will be able to ascend/descend stairs with 1 HR and reciprocal step pattern safely to access home and community.  Baseline: NT Goal status: IN PROGRESS  7.  Patient will report >/= 30/80 on LEFS to demonstrate improved functional ability. Baseline: 13 / 80 = 16.3 % Goal status: IN PROGRESS  8.  Patient will improve 5x STS time to </= 15 seconds with even weight shift to demonstrate improved functional strength and transfer efficiency . Baseline: 29.60 sec with weight shift to L LE Goal status: IN PROGRESS   9.  Patient will demonstrate decreased TUG time to </= 13.5 sec with or w/o LRAD to decrease risk for falls with transitional mobility Baseline: 23.79 sec with RW Goal status: IN PROGRESS   PLAN:  PT FREQUENCY: 2-3x/week (3x/wk x 2 wks, tapering  to 2x/wk for duration of POC)  PT DURATION: 4-6 weeks  PLANNED INTERVENTIONS: Therapeutic exercises, Therapeutic activity, Neuromuscular re-education, Balance training, Gait training, Patient/Family education, Self Care, Joint mobilization, Stair training, DME instructions, Dry Needling, Electrical stimulation, Cryotherapy, Moist heat, Taping, Vasopneumatic device, Ultrasound, Ionotophoresis 4mg /ml Dexamethasone, Manual therapy, and Re-evaluation  PLAN FOR NEXT SESSION: progress R knee ROM and B LE strengthening - gradually progress to standing activities; review & update HEP PRN; balance training; gait training to normalize gait pattern and work on weaning to [facility] as indicated; MT +/- modalities for R knee pain and R LE edema management; weekly R knee AROM assessment   Darleene Cleaver, PTA 10/17/2022, 9:49 AM    Date of referral: 08/10/22 Referring provider: Joen Laura, MD Referring diagnosis? X91.478 (ICD-10-CM) - Status post right partial knee replacement  Treatment diagnosis? (if different than referring diagnosis)  Stiffness of right knee, not elsewhere classified  Acute pain of right knee  Other  abnormalities of gait and mobility  Muscle weakness (generalized)  Localized edema  What was this (referring dx) caused by? Surgery (Type: R unicondylar knee replacement) and Arthritis  Nature of Condition: Initial Onset (within last 3 months)   Laterality: Rt  Current Functional Measure Score: LEFS 13 / 80 = 16.3 %  Objective measurements identify impairments when they are compared to normal values, the uninvolved extremity, and prior level of function.  [x]  Yes  []  No  Objective assessment of functional ability: Moderate functional limitations   Briefly describe symptoms: Current deficits include R knee pain, increased edema in R LE, limited and painful R knee ROM, R>L LE weakness and impaired ADLs, gait and mobility.  How did symptoms start: OA leading to R unicondylar knee replacement on 09/18/22  Average pain intensity:  Last 24 hours: 5-6/10  Past week: 5-6/10  How often does the pt experience symptoms? Constantly  How much have the symptoms interfered with usual daily activities? Quite a bit  How has condition changed since care began at this facility? NA - initial visit  In general, how is the patients overall health? Very Good

## 2022-10-18 DIAGNOSIS — N398 Other specified disorders of urinary system: Secondary | ICD-10-CM | POA: Diagnosis not present

## 2022-10-18 DIAGNOSIS — N3281 Overactive bladder: Secondary | ICD-10-CM | POA: Diagnosis not present

## 2022-10-18 DIAGNOSIS — N8111 Cystocele, midline: Secondary | ICD-10-CM | POA: Diagnosis not present

## 2022-10-19 ENCOUNTER — Encounter: Payer: Self-pay | Admitting: Physical Therapy

## 2022-10-19 ENCOUNTER — Ambulatory Visit: Payer: Medicare Other | Admitting: Physical Therapy

## 2022-10-19 DIAGNOSIS — M25661 Stiffness of right knee, not elsewhere classified: Secondary | ICD-10-CM | POA: Diagnosis not present

## 2022-10-19 DIAGNOSIS — R600 Localized edema: Secondary | ICD-10-CM | POA: Diagnosis not present

## 2022-10-19 DIAGNOSIS — M6281 Muscle weakness (generalized): Secondary | ICD-10-CM | POA: Diagnosis not present

## 2022-10-19 DIAGNOSIS — R2689 Other abnormalities of gait and mobility: Secondary | ICD-10-CM | POA: Diagnosis not present

## 2022-10-19 DIAGNOSIS — M25561 Pain in right knee: Secondary | ICD-10-CM | POA: Diagnosis not present

## 2022-10-19 NOTE — Therapy (Signed)
OUTPATIENT PHYSICAL THERAPY TREATMENT    Patient Name: Megan Crane MRN: [id number] DOB:08/11/1950, 72 y.o., female Today's Date: 10/19/2022   END OF SESSION:  PT End of Session - 10/19/22 1015     Visit Number 11    Date for PT Re-Evaluation 11/02/22    Authorization Type [employer]    Authorization Time Period 09/21/22-11/02/22    Authorization - Visit Number 11    Authorization - Number of Visits 16    Progress Note Due on Visit 15   MD PN on visit #5 - 10/02/22   PT Start Time 1015    PT Stop Time 1121    PT Time Calculation (min) 66 min    Activity Tolerance Patient tolerated treatment well    Behavior During Therapy [facility] for tasks assessed/performed;Anxious                     Past Medical History:  Diagnosis Date   Anxiety disorder    hx vasovagal responses   Arthritis    Cancer (HCC)    Deep vein thrombosis (DVT) (HCC)    hx of in left left after covid in 2021   Headache(784.0)    History of kidney stones    Hydronephrosis, right    Hyperlipidemia    Hypertension    Nephrolithiasis    Pneumonia    Positive nasal culture for methicillin resistant Staphylococcus aureus    Right ureteral stone    Thrombophlebitis of leg, left, superficial 11/17/2020   Urinary incontinence    sees Dr. McDiarmid    Vasovagal syncope 08/02/2022   Past Surgical History:  Procedure Laterality Date   BLADDER REPAIR     COLONOSCOPY  12/28/2015   per Dr. Adela Lank, serrated polyps and diverticula, repeat in 3 yrs    CYSTOSCOPY  06/09/2011   Procedure: CYSTOSCOPY;  Surgeon: Martina Sinner, MD;  Location: [facility];  Service: Urology;  Laterality: N/A;   CYSTOSCOPY W/ URETERAL STENT PLACEMENT  02/03/2014   Procedure: CYSTOSCOPY WITH  RIGHT RETROGRADE PYELOGRAM/ RIGHT URETERAL STENT PLACEMENT;  Surgeon: Anner Crete, MD;  Location: [facility];  Service: Urology;;   CYSTOSCOPY WITH BIOPSY  02/03/2014   Procedure: CYSTOSCOPY WITH BIOPSY;  Surgeon:  Anner Crete, MD;  Location: [facility];  Service: Urology;;   PARTIAL KNEE ARTHROPLASTY Right 09/18/2022   Procedure: UNICOMPARTMENTAL KNEE;  Surgeon: Joen Laura, MD;  Location: [facility];  Service: Orthopedics;  Laterality: Right;   PILONIDAL CYST EXCISION  1974   RECTOCELE REPAIR  06/09/2011   Procedure: POSTERIOR REPAIR (RECTOCELE);  Surgeon: Martina Sinner, MD;  Location: [facility];  Service: Urology;  Laterality: N/A;  Xenform graft 6x10   RIGHT URETEROSCOPIC STONE EXTRACTION / STENT PLACEMENT  03/13/2000   URETEROSCOPY Right 02/03/2014   Procedure: URETEROSCOPY WITH MANAGEMENT OF URETERAL STRICTURE;  Surgeon: Anner Crete, MD;  Location: [facility];  Service: Urology;  Laterality: Right;   VAGINAL HYSTERECTOMY  06/09/2011   Procedure: HYSTERECTOMY VAGINAL;  Surgeon: Mickel Baas, MD;  Location: [facility];  Service: Gynecology;  Laterality: N/A;   VAGINAL PROLAPSE REPAIR  06/09/2011   Procedure: VAGINAL VAULT SUSPENSION;  Surgeon: Martina Sinner, MD;  Location: [facility];  Service: Urology;  Laterality: N/A;   VEIN LIGATION AND STRIPPING     LEFT LEG   Patient Active Problem List   Diagnosis Date Noted   S/P right unicompartmental knee replacement 09/18/2022  IC (interstitial cystitis) 09/04/2022   Pelvic pressure in female 08/22/2022   Visit for pre-operative examination 08/07/2022   Vasovagal syncope 08/02/2022   Nasal dryness 06/30/2022   Precancerous skin lesion 06/30/2022   Bilateral carpal tunnel syndrome 03/02/2022   Swelling of both hands 03/02/2022   Atypical chest pain 03/02/2022   OAB (overactive bladder) 11/23/2021   Corn of foot 11/23/2021   Atrophic vaginitis 11/23/2021   COVID-19 virus infection 11/17/2020   Raynaud's phenomenon without gangrene 11/17/2020   Primary osteoarthritis involving multiple joints 11/24/2019   GERD (gastroesophageal reflux disease) 12/26/2016   Edema 05/29/2014   Nasal colonization with  methicillin-resistant Staphylococcus aureus 01/22/2014   Varicose veins of bilateral lower extremities with other complications 12/11/2012   Allergic rhinitis 11/29/2009   MOTION SICKNESS 11/29/2009   Hyperlipidemia 08/31/2008   Anxiety state 08/31/2008   Primary hypertension 08/31/2008   PILONIDAL CYST 08/31/2008   HEADACHE 08/31/2008   URINARY INCONTINENCE 08/31/2008   NEPHROLITHIASIS, HX OF 08/31/2008   POSTNASAL DRIP SYNDROME 05/28/2006    PCP: Nelwyn Salisbury, MD   REFERRING PROVIDER: Joen Laura, MD   REFERRING DIAG: [phone] (ICD-10-CM) - Status post right partial knee replacement   THERAPY DIAG:  Stiffness of right knee, not elsewhere classified  Acute pain of right knee  Other abnormalities of gait and mobility  Muscle weakness (generalized)  Localized edema  RATIONALE FOR EVALUATION AND TREATMENT: Rehabilitation  ONSET DATE: 09/18/2022 - R unicompartmental knee replacement  NEXT MD VISIT: 10/31/2022   SUBJECTIVE:                                                                                                                                                                                                         SUBJECTIVE STATEMENT: Pt reports increased ankle pain after walking over uneven ground to get to her neighbors. Pt inquiring about airline travel to go down to [place] to visit her dtr - discussed potential issues with airline travel.  PAIN: Are you having pain? Yes: NPRS scale: 3/10 Pain location: R anterior lower leg Pain description: achy Aggravating factors: prolonged sitting and first getting up Relieving factors: walking  PERTINENT HISTORY:  OA, anxiety, HTN, Raynaud's phenomenon, GERD, B CTS, DVT s/p COVID in 2021, syncope  PRECAUTIONS: None  RED FLAGS: None  WEIGHT BEARING RESTRICTIONS: No  FALLS:  Has patient fallen in last 6 months? 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 Go back to work part-time."   OBJECTIVE: (objective measures completed at initial evaluation unless otherwise dated)  DIAGNOSTIC FINDINGS:  09/18/22 - DG R knee:  IMPRESSION: Medial compartment hemiarthroplasty without immediate postoperative complication.  PATIENT SURVEYS:  LEFS 13 / 80 = 16.3 %  COGNITION: Overall cognitive status: Within functional limits for tasks assessed    SENSATION: WFL  EDEMA:  Circumferential: 44 cm at joint line, 53 cm (5 cm above joint line)  MUSCLE LENGTH: Hamstrings: Mild/mod tight R>L ITB: Mild/mod tight R>L Piriformis: Mild tight R>L Hip flexors: Mild tight R>L Quads: Mod tight R>L Heelcord: NT  POSTURE:  No Significant postural limitations  PALPATION: R knee swollen and slightly warm to touch with mild erythema present.  Decreased R patellar mobility in all directions.  LOWER EXTREMITY ROM:  Active ROM Right eval Left eval R 09/27/22 R 09/29/22 R 10/02/22 R 10/06/22 R 10/12/22 R 10/19/22  Knee flexion 77 127 85 91 100 105 104 100  Knee extension -15 LAQ +2 -10 LAQ -9 LAQ -8 LAQ -7 LAQ -4 LAQ 0 LAQ   Passive ROM Right eval R 09/27/22 R 10/02/22 R 10/12/22  Knee flexion 82 85    Knee extension -5 0 -1 0   LOWER EXTREMITY MMT:  MMT Right eval Left eval R 10/19/22 L 10/19/22  Hip flexion 4+ 5 4+ 5  Hip extension 4 4 4 4   Hip abduction 4- 4 4 4   Hip adduction 4 4+ 4+ 4+  Hip internal rotation 4 4+ 4+ 4+  Hip external rotation 4 4 4 4   Knee flexion 4 5 4+ 5  Knee extension 4  5 4+ 5  Ankle dorsiflexion 4 4 4 4   Ankle plantarflexion   4 (10 SLS HR) 4 (12 SLS HR)  Ankle  inversion      Ankle eversion       (Blank rows = not tested)  FUNCTIONAL TESTS:  5 times sit to stand: 29.60 sec with weight shift to L LE Timed up and go (TUG): 23.79 sec with RW  GAIT: Distance walked: Clinic distances Assistive device utilized: Environmental consultant - 2 wheeled Level of assistance: Modified independence Gait pattern: step through pattern and decreased hip/knee flexion- Right Comments: Ambulates with stiff R knee but good heel strike on weight acceptance   TODAY'S TREATMENT:   10/19/22 GAIT TRAINING: To normalize gait pattern, improve safety with SPC, and prepare to wean from AD.  200 ft with [facility] emphasizing increased hip and knee flexion with heel strike on weight acceptance and heel-toe progression   THERAPEUTIC EXERCISE: to improve flexibility, strength and mobility.  Demonstration, verbal and tactile cues throughout for technique.  Rec Bike - partial to full revolutions L1 x 6 min B side-stepping with looped GTB at ankles 4 x 10 ft along counter Fwd/back monster walk 2 x 10 ft each direction, 1 hand on counter for balance  R fwd step-up to 6" step 2 x 10, 1 hand support for balance R lateral step-up to 6" step 2 x 10, 2 hand support for balance B lateral step-up & over 6" step x 10 Step stretch for R knee flexion   THERAPEUTIC ACTIVITIES: LE MMT R knee ROM assessment Discussed patient's concerns regarding airline travel while recovering from surgery  MODALITIES: Game Ready vasopneumatic compression post session to R knee x 10 min, medium compression, 34 to reduce post-exercise pain and swelling/edema   10/17/22 THERAPEUTIC EXERCISE: to improve flexibility, strength and mobility.  Demonstration, verbal and tactile cues throughout for technique.  Nustep L5x32min Bike L1x3 min - seat 7 Functional squat x 10 touching floor R single leg deadlift 2 x 10 touching 9' stool Clock balance 5x R/L 1/2 circle Standing R GTB 4-way SLR x 10 each direction bil - for  review  MODALITIES: Game Ready vasopneumatic compression post session to R knee x 10 min, medium compression, 34 to reduce post-exercise pain and swelling/edema   10/12/22 THERAPEUTIC EXERCISE: to improve flexibility, strength and mobility.  Demonstration, verbal and tactile cues throughout for technique.  Rec Bike - L1 x 7 min Seated GTB R knee flexion/HS curls x 10 Seated GTB R knee extension/LAQ 2 x 10 Standing R/L GTB 4-way SLR x 10 each direction bil  THERAPEUTIC ACTIVITIES: Answered all of patient's questions related to mobility and ADL performance in home   MODALITIES: Game Ready vasopneumatic compression post session to R knee x 10 min, medium compression, 34 to reduce post-exercise pain and swelling/edema   PATIENT EDUCATION:  Education details: HEP update - GTB side-stepping and monster walk for glute strengthening and ankle stability; step stretch for R knee flexion ROM   Person educated: Patient Education method: Explanation, Demonstration, Tactile cues, Verbal cues, and Handouts Education comprehension: verbalized understanding, returned demonstration, verbal cues required, tactile cues required, and needs further education  HOME EXERCISE PROGRAM: Access Code: [email or web address] URL: [email or web address] Date: 10/19/2022 Prepared by: Glenetta Hew  Exercises - Supine Heel Slide with Strap  - 2 x daily - 7 x weekly - 2 sets - 10 reps - 3 sec hold - Heel Toe Raises with Counter Support  - 2 x daily - 7 x weekly - 2 sets - 10 reps - 3 sec hold - Standing Knee Flexion  - 2 x daily - 7 x weekly - 2 sets - 10 reps - 3 sec hold - Backward Weight Shift and Opposite Arm Raise with Walker  - 1 x daily - 7 x weekly - 2 sets - 10 reps - [place]  - 1 x daily - 7 x weekly - 2 sets - 10 reps - Sitting Knee Extension with Resistance  - 1 x daily - 7 x weekly - 2 sets - 10 reps - Seated Hamstring Curl with Anchored Resistance  - 1 x daily - 7 x weekly - 2 sets - 10 reps -  Long Sitting 4 Way Patellar Glide  - 2-3 x daily - 7 x weekly - 2 sets - 10 reps - Standing Hip Flexion with Anchored Resistance and Chair Support  - 1 x daily - 3-4 x weekly - 2 sets - 10 reps - 3 sec hold - Standing Hip Adduction with Anchored Resistance  - 1 x daily - 3-4 x weekly - 2 sets - 10 reps - 3 sec hold - Standing Hip Extension with Anchored Resistance  - 1 x daily - 3-4 x weekly - 2 sets - 10 reps - 3 sec hold - Standing Hip Abduction with Anchored Resistance  - 1 x daily - 3-4 x weekly - 2 sets - 10 reps - 3 sec hold - Side Stepping with Resistance at Ankles  - 1 x daily - 3 x weekly - 2 sets - 10 reps - Band Walks  - 1 x daily -  3 x weekly - 2 sets - 10 reps - Standing Knee Flexion Stretch on Step  - 2 x daily - 7 x weekly - 2 sets - 10 reps - 5 sec hold  Patient Education - Rubbing with Different Textures   ASSESSMENT:  CLINICAL IMPRESSION: Henreitta continues to require intermittent cues to increase R hip and knee flexion during gait, avoiding keeping knee locked in extension.  She notes increased ankle pain after walking on uneven ground.  MMT reassessment revealing continue B proximal weakness in glutes as well as B ankle weakness, likely contributing to instability and hence increased ankle pain. Reviewed current hip strengthening in HEP and added side-stepping and monster walks for alternate days. Will also try to progress ankle strengthening and proprioception in upcoming visits.  She continues to approach stairs with step-to pattern leading with L LE, therefore introduced forward and lateral step-ups leading with R LE to promote increased hip and knee flexion as well as CKC functional strengthening. R knee extension continues to improve with pt now able to achieve full knee extension to 0 in LAQ but flexion ROM has plateaued over past few measurements - step stretch added to HEP and will increase emphasis on flexion ROM in upcoming visits.  Kiann is progressing well toward her  goals and will benefit from continued skilled PT to address ongoing pain, ROM and strength deficits to improve mobility and activity tolerance with decreased pain interference.   OBJECTIVE IMPAIRMENTS: Abnormal gait, decreased activity tolerance, decreased balance, decreased endurance, decreased knowledge of condition, decreased knowledge of use of DME, decreased mobility, difficulty walking, decreased ROM, decreased strength, decreased safety awareness, increased edema, increased fascial restrictions, impaired perceived functional ability, increased muscle spasms, impaired flexibility, and pain.   ACTIVITY LIMITATIONS: sitting, standing, squatting, sleeping, stairs, transfers, bed mobility, bathing, toileting, dressing, locomotion level, and caring for others  PARTICIPATION LIMITATIONS: meal prep, cleaning, laundry, driving, shopping, community activity, and yard work  PERSONAL FACTORS: Fitness, Past/current experiences, Time since onset of injury/illness/exacerbation, and 3+ comorbidities: OA, anxiety, HTN, Raynaud's phenomenon, GERD, B CTS, DVT s/p COVID in 2021, syncope  are also affecting patient's functional outcome.   REHAB POTENTIAL: Excellent  CLINICAL DECISION MAKING: Stable/uncomplicated  EVALUATION COMPLEXITY: Low   GOALS: Goals reviewed with patient? Yes  SHORT TERM GOALS: Target date: 10/12/2022  Patient will be independent with initial HEP. Baseline: pt performing hospital issued HEP Goal status: MET  10/02/22  2.  Patient will demonstrate improved R knee AROM to >/= 5-90 deg to allow for normal gait pattern. Baseline: R knee AROM -15-77, PROM -5-82 Goal status: MET  10/02/22 - R knee AROM -1-100, lacking 8 extension in LAQ  LONG TERM GOALS: Target date: 11/02/2022  Patient will be independent with advanced/ongoing HEP to improve outcomes and carryover.  Baseline:  Goal status: IN PROGRESS  10/19/22 - HEP updated today  2.  Patient will report at least 75%  improvement in R knee pain to improve QOL. Baseline: 5-6/10 Goal status: IN PROGRESS  10/12/22 - current reported pain 1.5/10   3.  Patient will demonstrate improved R knee AROM to >/= -2-120 deg to allow for normal gait and stair mechanics. Baseline: R knee AROM -15-77, PROM -5-82 Goal status: IN PROGRESS  10/19/22 - R knee AROM 0-100, with full extension to 0 in LAQ  4.  Patient will demonstrate improved B LE strength to >/= 4+/5 for improved stability and ease of mobility. Baseline: refer to above LE MMT table Goal status: IN PROGRESS  10/19/22 - strength improving but R LE remains weaker with hip ABD, ext and ER 4/5, as well as B ankles 4/5  5.  Patient will be able to ambulate 600' with or w/o LRAD and normal gait pattern without increased pain to access community.  Baseline: antalgic gait with RW Goal status: IN PROGRESS  6. Patient will be able to ascend/descend stairs with 1 HR and reciprocal step pattern safely to access home and community.  Baseline: NT Goal status: IN PROGRESS  7.  Patient will report >/= 30/80 on LEFS to demonstrate improved functional ability. Baseline: 13 / 80 = 16.3 % Goal status: IN PROGRESS  8.  Patient will improve 5x STS time to </= 15 seconds with even weight shift to demonstrate improved functional strength and transfer efficiency . Baseline: 29.60 sec with weight shift to L LE Goal status: IN PROGRESS   9.  Patient will demonstrate decreased TUG time to </= 13.5 sec with or w/o LRAD to decrease risk for falls with transitional mobility Baseline: 23.79 sec with RW Goal status: IN PROGRESS   PLAN:  PT FREQUENCY: 2-3x/week (3x/wk x 2 wks, tapering to 2x/wk for duration of POC)  PT DURATION: 4-6 weeks  PLANNED INTERVENTIONS: Therapeutic exercises, Therapeutic activity, Neuromuscular re-education, Balance training, Gait training, Patient/Family education, Self Care, Joint mobilization, Stair training, DME instructions, Dry Needling,  Electrical stimulation, Cryotherapy, Moist heat, Taping, Vasopneumatic device, Ultrasound, Ionotophoresis 4mg /ml Dexamethasone, Manual therapy, and Re-evaluation  PLAN FOR NEXT SESSION: progress R knee ROM (emphasis on flexion ROM) and B LE strengthening - progress standing activities/functional strengthening; review & update HEP PRN; balance training; gait training to normalize gait pattern and work on weaning SPC as indicated; MT for increased R knee ROM and edema management; modalities including vaso for R knee pain and R LE edema management; weekly R knee AROM assessment  Marry Guan, PT 10/19/2022, 11:53 AM    Date of referral: 08/10/22 Referring provider: Joen Laura, MD Referring diagnosis? K44.010 (ICD-10-CM) - Status post right partial knee replacement  Treatment diagnosis? (if different than referring diagnosis)  Stiffness of right knee, not elsewhere classified  Acute pain of right knee  Other abnormalities of gait and mobility  Muscle weakness (generalized)  Localized edema  What was this (referring dx) caused by? Surgery (Type: R unicondylar knee replacement) and Arthritis  Nature of Condition: Initial Onset (within last 3 months)   Laterality: Rt  Current Functional Measure Score: LEFS 13 / 80 = 16.3 %  Objective measurements identify impairments when they are compared to normal values, the uninvolved extremity, and prior level of function.  [x]  Yes  []  No  Objective assessment of functional ability: Moderate functional limitations   Briefly describe symptoms: Current deficits include R knee pain, increased edema in R LE, limited and painful R knee ROM, R>L LE weakness and impaired ADLs, gait and mobility.  How did symptoms start: OA leading to R unicondylar knee replacement on 09/18/22  Average pain intensity:  Last 24 hours: 5-6/10  Past week: 5-6/10  How often does the pt experience symptoms? Constantly  How much have the symptoms interfered  with usual daily activities? Quite a bit  How has condition changed since care began at this facility? NA - initial visit  In general, how is the patients overall health? Very Good

## 2022-10-20 ENCOUNTER — Telehealth: Payer: Self-pay | Admitting: Family Medicine

## 2022-10-20 NOTE — Telephone Encounter (Signed)
Wants pcp to know she is doing well post knee surgery, walking with a cane

## 2022-10-24 ENCOUNTER — Ambulatory Visit: Payer: Medicare Other

## 2022-10-24 DIAGNOSIS — R600 Localized edema: Secondary | ICD-10-CM

## 2022-10-24 DIAGNOSIS — M25661 Stiffness of right knee, not elsewhere classified: Secondary | ICD-10-CM | POA: Diagnosis not present

## 2022-10-24 DIAGNOSIS — M6281 Muscle weakness (generalized): Secondary | ICD-10-CM

## 2022-10-24 DIAGNOSIS — R2689 Other abnormalities of gait and mobility: Secondary | ICD-10-CM

## 2022-10-24 DIAGNOSIS — M25561 Pain in right knee: Secondary | ICD-10-CM

## 2022-10-24 NOTE — Therapy (Signed)
OUTPATIENT PHYSICAL THERAPY TREATMENT    Patient Name: Megan Crane MRN: [id number] DOB:1950/11/02, 72 y.o., female Today's Date: 10/24/2022   END OF SESSION:  PT End of Session - 10/24/22 1124     Visit Number 12    Date for PT Re-Evaluation 11/02/22    Authorization Type [employer]    Authorization Time Period 09/21/22-11/02/22    Authorization - Visit Number 12    Authorization - Number of Visits 16    Progress Note Due on Visit 15   MD PN on visit #5 - 10/02/22   PT Start Time 1019    PT Stop Time 1110    PT Time Calculation (min) 51 min    Activity Tolerance Patient tolerated treatment well    Behavior During Therapy [facility] for tasks assessed/performed;Anxious                      Past Medical History:  Diagnosis Date   Anxiety disorder    hx vasovagal responses   Arthritis    Cancer (HCC)    Deep vein thrombosis (DVT) (HCC)    hx of in left left after covid in 2021   Headache(784.0)    History of kidney stones    Hydronephrosis, right    Hyperlipidemia    Hypertension    Nephrolithiasis    Pneumonia    Positive nasal culture for methicillin resistant Staphylococcus aureus    Right ureteral stone    Thrombophlebitis of leg, left, superficial 11/17/2020   Urinary incontinence    sees Dr. McDiarmid    Vasovagal syncope 08/02/2022   Past Surgical History:  Procedure Laterality Date   BLADDER REPAIR     COLONOSCOPY  12/28/2015   per Dr. Adela Lank, serrated polyps and diverticula, repeat in 3 yrs    CYSTOSCOPY  06/09/2011   Procedure: CYSTOSCOPY;  Surgeon: Martina Sinner, MD;  Location: [facility];  Service: Urology;  Laterality: N/A;   CYSTOSCOPY W/ URETERAL STENT PLACEMENT  02/03/2014   Procedure: CYSTOSCOPY WITH  RIGHT RETROGRADE PYELOGRAM/ RIGHT URETERAL STENT PLACEMENT;  Surgeon: Anner Crete, MD;  Location: [facility];  Service: Urology;;   CYSTOSCOPY WITH BIOPSY  02/03/2014   Procedure: CYSTOSCOPY WITH BIOPSY;  Surgeon:  Anner Crete, MD;  Location: [facility];  Service: Urology;;   PARTIAL KNEE ARTHROPLASTY Right 09/18/2022   Procedure: UNICOMPARTMENTAL KNEE;  Surgeon: Joen Laura, MD;  Location: [facility];  Service: Orthopedics;  Laterality: Right;   PILONIDAL CYST EXCISION  1974   RECTOCELE REPAIR  06/09/2011   Procedure: POSTERIOR REPAIR (RECTOCELE);  Surgeon: Martina Sinner, MD;  Location: [facility];  Service: Urology;  Laterality: N/A;  Xenform graft 6x10   RIGHT URETEROSCOPIC STONE EXTRACTION / STENT PLACEMENT  03/13/2000   URETEROSCOPY Right 02/03/2014   Procedure: URETEROSCOPY WITH MANAGEMENT OF URETERAL STRICTURE;  Surgeon: Anner Crete, MD;  Location: [facility];  Service: Urology;  Laterality: Right;   VAGINAL HYSTERECTOMY  06/09/2011   Procedure: HYSTERECTOMY VAGINAL;  Surgeon: Mickel Baas, MD;  Location: [facility];  Service: Gynecology;  Laterality: N/A;   VAGINAL PROLAPSE REPAIR  06/09/2011   Procedure: VAGINAL VAULT SUSPENSION;  Surgeon: Martina Sinner, MD;  Location: [facility];  Service: Urology;  Laterality: N/A;   VEIN LIGATION AND STRIPPING     LEFT LEG   Patient Active Problem List   Diagnosis Date Noted   S/P right unicompartmental knee replacement 09/18/2022  IC (interstitial cystitis) 09/04/2022   Pelvic pressure in female 08/22/2022   Visit for pre-operative examination 08/07/2022   Vasovagal syncope 08/02/2022   Nasal dryness 06/30/2022   Precancerous skin lesion 06/30/2022   Bilateral carpal tunnel syndrome 03/02/2022   Swelling of both hands 03/02/2022   Atypical chest pain 03/02/2022   OAB (overactive bladder) 11/23/2021   Corn of foot 11/23/2021   Atrophic vaginitis 11/23/2021   COVID-19 virus infection 11/17/2020   Raynaud's phenomenon without gangrene 11/17/2020   Primary osteoarthritis involving multiple joints 11/24/2019   GERD (gastroesophageal reflux disease) 12/26/2016   Edema 05/29/2014   Nasal colonization with  methicillin-resistant Staphylococcus aureus 01/22/2014   Varicose veins of bilateral lower extremities with other complications 12/11/2012   Allergic rhinitis 11/29/2009   MOTION SICKNESS 11/29/2009   Hyperlipidemia 08/31/2008   Anxiety state 08/31/2008   Primary hypertension 08/31/2008   PILONIDAL CYST 08/31/2008   HEADACHE 08/31/2008   URINARY INCONTINENCE 08/31/2008   NEPHROLITHIASIS, HX OF 08/31/2008   POSTNASAL DRIP SYNDROME 05/28/2006    PCP: Nelwyn Salisbury, MD   REFERRING PROVIDER: Joen Laura, MD   REFERRING DIAG: [phone] (ICD-10-CM) - Status post right partial knee replacement   THERAPY DIAG:  Stiffness of right knee, not elsewhere classified  Acute pain of right knee  Other abnormalities of gait and mobility  Muscle weakness (generalized)  Localized edema  RATIONALE FOR EVALUATION AND TREATMENT: Rehabilitation  ONSET DATE: 09/18/2022 - R unicompartmental knee replacement  NEXT MD VISIT: 10/31/2022   SUBJECTIVE:                                                                                                                                                                                                         SUBJECTIVE STATEMENT: Pt reports not sleeping well last night, the knee gives her a fit today.  PAIN: Are you having pain? Yes: NPRS scale: 3/10 Pain location: R anterior lower leg Pain description: achy Aggravating factors: prolonged sitting and first getting up Relieving factors: walking  PERTINENT HISTORY:  OA, anxiety, HTN, Raynaud's phenomenon, GERD, B CTS, DVT s/p COVID in 2021, syncope  PRECAUTIONS: None  RED FLAGS: None  WEIGHT BEARING RESTRICTIONS: No  FALLS:  Has patient fallen in last 6 months? 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 Go back to work part-time."   OBJECTIVE: (objective measures completed at initial evaluation unless otherwise dated)  DIAGNOSTIC FINDINGS:  09/18/22 - DG R knee:  IMPRESSION: Medial compartment hemiarthroplasty without immediate postoperative complication.  PATIENT SURVEYS:  LEFS 13 / 80 = 16.3 %  COGNITION: Overall cognitive status: Within functional limits for tasks assessed    SENSATION: WFL  EDEMA:  Circumferential: 44 cm at joint line, 53 cm (5 cm above joint line)  MUSCLE LENGTH: Hamstrings: Mild/mod tight R>L ITB: Mild/mod tight R>L Piriformis: Mild tight R>L Hip flexors: Mild tight R>L Quads: Mod tight R>L Heelcord: NT  POSTURE:  No Significant postural limitations  PALPATION: R knee swollen and slightly warm to touch with mild erythema present.  Decreased R patellar mobility in all directions.  LOWER EXTREMITY ROM:  Active ROM Right eval Left eval R 09/27/22 R 09/29/22 R 10/02/22 R 10/06/22 R 10/12/22 R 10/19/22  Knee flexion 77 127 85 91 100 105 104 100  Knee extension -15 LAQ +2 -10 LAQ -9 LAQ -8 LAQ -7 LAQ -4 LAQ 0 LAQ   Passive ROM Right eval R 09/27/22 R 10/02/22 R 10/12/22  Knee flexion 82 85    Knee extension -5 0 -1 0   LOWER EXTREMITY MMT:  MMT Right eval Left eval R 10/19/22 L 10/19/22  Hip flexion 4+ 5 4+ 5  Hip extension 4 4 4 4   Hip abduction 4- 4 4 4   Hip adduction 4 4+ 4+ 4+  Hip internal rotation 4 4+ 4+ 4+  Hip external rotation 4 4 4 4   Knee flexion 4 5 4+ 5  Knee extension 4  5 4+ 5  Ankle dorsiflexion 4 4 4 4   Ankle plantarflexion   4 (10 SLS HR) 4 (12 SLS HR)  Ankle inversion      Ankle eversion       (Blank rows = not tested)  FUNCTIONAL TESTS:  5 times sit to stand: 29.60 sec with weight shift to L LE Timed up and  go (TUG): 23.79 sec with RW  GAIT: Distance walked: Clinic distances Assistive device utilized: Environmental consultant - 2 wheeled Level of assistance: Modified independence Gait pattern: step through pattern and decreased hip/knee flexion- Right Comments: Ambulates with stiff R knee but good heel strike on weight acceptance   TODAY'S TREATMENT:  10/24/22 THERAPEUTIC EXERCISE: to improve flexibility, strength and mobility.  Demonstration, verbal and tactile cues throughout for technique. Sit to stand with cues to keep feet even x 10  Single leg deadlift RLE reach to floor x 15 Step ups fwd 2 x 10 RLE; lateral 2 x 10 RLE 6' step Seated R hamstring curl GTB x 10 Seated R LAQ 3# x 10  Seated marches 3# x 10   10/19/22 GAIT TRAINING: To normalize gait pattern, improve safety with SPC, and prepare to wean from AD.  200 ft with [facility] emphasizing increased hip and knee flexion with heel strike on weight acceptance and heel-toe progression   THERAPEUTIC EXERCISE: to improve flexibility, strength and mobility.  Demonstration, verbal and tactile cues throughout for technique.  Rec Bike - partial to full revolutions L1 x 6 min B side-stepping with looped GTB at ankles 4 x 10 ft along counter Fwd/back monster walk 2 x 10 ft each direction, 1 hand on counter for balance  R fwd step-up to 6" step 2 x 10, 1 hand support for  balance R lateral step-up to 6" step 2 x 10, 2 hand support for balance B lateral step-up & over 6" step x 10 Step stretch for R knee flexion   THERAPEUTIC ACTIVITIES: LE MMT R knee ROM assessment Discussed patient's concerns regarding airline travel while recovering from surgery  MODALITIES: Game Ready vasopneumatic compression post session to R knee x 10 min, medium compression, 34 to reduce post-exercise pain and swelling/edema   10/17/22 THERAPEUTIC EXERCISE: to improve flexibility, strength and mobility.  Demonstration, verbal and tactile cues throughout for technique.  Nustep  L5x21min Bike L1x3 min - seat 7 Functional squat x 10 touching floor R single leg deadlift 2 x 10 touching 9' stool Clock balance 5x R/L 1/2 circle Standing R GTB 4-way SLR x 10 each direction bil - for review  MODALITIES: Game Ready vasopneumatic compression post session to R knee x 10 min, medium compression, 34 to reduce post-exercise pain and swelling/edema   10/12/22 THERAPEUTIC EXERCISE: to improve flexibility, strength and mobility.  Demonstration, verbal and tactile cues throughout for technique.  Rec Bike - L1 x 7 min Seated GTB R knee flexion/HS curls x 10 Seated GTB R knee extension/LAQ 2 x 10 Standing R/L GTB 4-way SLR x 10 each direction bil  THERAPEUTIC ACTIVITIES: Answered all of patient's questions related to mobility and ADL performance in home   MODALITIES: Game Ready vasopneumatic compression post session to R knee x 10 min, medium compression, 34 to reduce post-exercise pain and swelling/edema   PATIENT EDUCATION:  Education details: HEP update - GTB side-stepping and monster walk for glute strengthening and ankle stability; step stretch for R knee flexion ROM   Person educated: Patient Education method: Explanation, Demonstration, Tactile cues, Verbal cues, and Handouts Education comprehension: verbalized understanding, returned demonstration, verbal cues required, tactile cues required, and needs further education  HOME EXERCISE PROGRAM: Access Code: [email or web address] URL: [email or web address] Date: 10/19/2022 Prepared by: Glenetta Hew  Exercises - Supine Heel Slide with Strap  - 2 x daily - 7 x weekly - 2 sets - 10 reps - 3 sec hold - Heel Toe Raises with Counter Support  - 2 x daily - 7 x weekly - 2 sets - 10 reps - 3 sec hold - Standing Knee Flexion  - 2 x daily - 7 x weekly - 2 sets - 10 reps - 3 sec hold - Backward Weight Shift and Opposite Arm Raise with Walker  - 1 x daily - 7 x weekly - 2 sets - 10 reps - [place]  - 1 x daily - 7 x  weekly - 2 sets - 10 reps - Sitting Knee Extension with Resistance  - 1 x daily - 7 x weekly - 2 sets - 10 reps - Seated Hamstring Curl with Anchored Resistance  - 1 x daily - 7 x weekly - 2 sets - 10 reps - Long Sitting 4 Way Patellar Glide  - 2-3 x daily - 7 x weekly - 2 sets - 10 reps - Standing Hip Flexion with Anchored Resistance and Chair Support  - 1 x daily - 3-4 x weekly - 2 sets - 10 reps - 3 sec hold - Standing Hip Adduction with Anchored Resistance  - 1 x daily - 3-4 x weekly - 2 sets - 10 reps - 3 sec hold - Standing Hip Extension with Anchored Resistance  - 1 x daily - 3-4 x weekly - 2 sets - 10 reps - 3 sec hold - Standing Hip Abduction with  Anchored Resistance  - 1 x daily - 3-4 x weekly - 2 sets - 10 reps - 3 sec hold - Side Stepping with Resistance at Ankles  - 1 x daily - 3 x weekly - 2 sets - 10 reps - Band Walks  - 1 x daily - 3 x weekly - 2 sets - 10 reps - Standing Knee Flexion Stretch on Step  - 2 x daily - 7 x weekly - 2 sets - 10 reps - 5 sec hold  Patient Education - Rubbing with Different Textures   ASSESSMENT:  CLINICAL IMPRESSION: Continued progression of exercises to strengthen BLE. Worked on sit to stands with equal foot positioning as she tends to stagger her R foot out. Good response to the treatment overall. GR post session to address swelling.  Shata is progressing well toward her goals and will benefit from continued skilled PT to address ongoing pain, ROM and strength deficits to improve mobility and activity tolerance with decreased pain interference.   OBJECTIVE IMPAIRMENTS: Abnormal gait, decreased activity tolerance, decreased balance, decreased endurance, decreased knowledge of condition, decreased knowledge of use of DME, decreased mobility, difficulty walking, decreased ROM, decreased strength, decreased safety awareness, increased edema, increased fascial restrictions, impaired perceived functional ability, increased muscle spasms, impaired  flexibility, and pain.   ACTIVITY LIMITATIONS: sitting, standing, squatting, sleeping, stairs, transfers, bed mobility, bathing, toileting, dressing, locomotion level, and caring for others  PARTICIPATION LIMITATIONS: meal prep, cleaning, laundry, driving, shopping, community activity, and yard work  PERSONAL FACTORS: Fitness, Past/current experiences, Time since onset of injury/illness/exacerbation, and 3+ comorbidities: OA, anxiety, HTN, Raynaud's phenomenon, GERD, B CTS, DVT s/p COVID in 2021, syncope  are also affecting patient's functional outcome.   REHAB POTENTIAL: Excellent  CLINICAL DECISION MAKING: Stable/uncomplicated  EVALUATION COMPLEXITY: Low   GOALS: Goals reviewed with patient? Yes  SHORT TERM GOALS: Target date: 10/12/2022  Patient will be independent with initial HEP. Baseline: pt performing hospital issued HEP Goal status: MET  10/02/22  2.  Patient will demonstrate improved R knee AROM to >/= 5-90 deg to allow for normal gait pattern. Baseline: R knee AROM -15-77, PROM -5-82 Goal status: MET  10/02/22 - R knee AROM -1-100, lacking 8 extension in LAQ  LONG TERM GOALS: Target date: 11/02/2022  Patient will be independent with advanced/ongoing HEP to improve outcomes and carryover.  Baseline:  Goal status: IN PROGRESS  10/19/22 - HEP updated today  2.  Patient will report at least 75% improvement in R knee pain to improve QOL. Baseline: 5-6/10 Goal status: IN PROGRESS  10/12/22 - current reported pain 1.5/10   3.  Patient will demonstrate improved R knee AROM to >/= -2-120 deg to allow for normal gait and stair mechanics. Baseline: R knee AROM -15-77, PROM -5-82 Goal status: IN PROGRESS  10/19/22 - R knee AROM 0-100, with full extension to 0 in LAQ  4.  Patient will demonstrate improved B LE strength to >/= 4+/5 for improved stability and ease of mobility. Baseline: refer to above LE MMT table Goal status: IN PROGRESS 10/19/22 - strength improving but  R LE remains weaker with hip ABD, ext and ER 4/5, as well as B ankles 4/5  5.  Patient will be able to ambulate 600' with or w/o LRAD and normal gait pattern without increased pain to access community.  Baseline: antalgic gait with RW Goal status: IN PROGRESS  6. Patient will be able to ascend/descend stairs with 1 HR and reciprocal step pattern safely to access  home and community.  Baseline: NT Goal status: IN PROGRESS  7.  Patient will report >/= 30/80 on LEFS to demonstrate improved functional ability. Baseline: 13 / 80 = 16.3 % Goal status: IN PROGRESS  8.  Patient will improve 5x STS time to </= 15 seconds with even weight shift to demonstrate improved functional strength and transfer efficiency . Baseline: 29.60 sec with weight shift to L LE Goal status: IN PROGRESS   9.  Patient will demonstrate decreased TUG time to </= 13.5 sec with or w/o LRAD to decrease risk for falls with transitional mobility Baseline: 23.79 sec with RW Goal status: IN PROGRESS   PLAN:  PT FREQUENCY: 2-3x/week (3x/wk x 2 wks, tapering to 2x/wk for duration of POC)  PT DURATION: 4-6 weeks  PLANNED INTERVENTIONS: Therapeutic exercises, Therapeutic activity, Neuromuscular re-education, Balance training, Gait training, Patient/Family education, Self Care, Joint mobilization, Stair training, DME instructions, Dry Needling, Electrical stimulation, Cryotherapy, Moist heat, Taping, Vasopneumatic device, Ultrasound, Ionotophoresis 4mg /ml Dexamethasone, Manual therapy, and Re-evaluation  PLAN FOR NEXT SESSION: progress R knee ROM (emphasis on flexion ROM) and B LE strengthening - progress standing activities/functional strengthening; review & update HEP PRN; balance training; gait training to normalize gait pattern and work on weaning SPC as indicated; MT for increased R knee ROM and edema management; modalities including vaso for R knee pain and R LE edema management; weekly R knee AROM assessment  Darleene Cleaver, PTA 10/24/2022, 12:17 PM    Date of referral: 08/10/22 Referring provider: Joen Laura, MD Referring diagnosis? N82.956 (ICD-10-CM) - Status post right partial knee replacement  Treatment diagnosis? (if different than referring diagnosis)  Stiffness of right knee, not elsewhere classified  Acute pain of right knee  Other abnormalities of gait and mobility  Muscle weakness (generalized)  Localized edema  What was this (referring dx) caused by? Surgery (Type: R unicondylar knee replacement) and Arthritis  Nature of Condition: Initial Onset (within last 3 months)   Laterality: Rt  Current Functional Measure Score: LEFS 13 / 80 = 16.3 %  Objective measurements identify impairments when they are compared to normal values, the uninvolved extremity, and prior level of function.  [x]  Yes  []  No  Objective assessment of functional ability: Moderate functional limitations   Briefly describe symptoms: Current deficits include R knee pain, increased edema in R LE, limited and painful R knee ROM, R>L LE weakness and impaired ADLs, gait and mobility.  How did symptoms start: OA leading to R unicondylar knee replacement on 09/18/22  Average pain intensity:  Last 24 hours: 5-6/10  Past week: 5-6/10  How often does the pt experience symptoms? Constantly  How much have the symptoms interfered with usual daily activities? Quite a bit  How has condition changed since care began at this facility? NA - initial visit  In general, how is the patients overall health? Very Good

## 2022-10-26 ENCOUNTER — Ambulatory Visit: Payer: Medicare Other

## 2022-10-26 DIAGNOSIS — M6281 Muscle weakness (generalized): Secondary | ICD-10-CM | POA: Diagnosis not present

## 2022-10-26 DIAGNOSIS — R600 Localized edema: Secondary | ICD-10-CM | POA: Diagnosis not present

## 2022-10-26 DIAGNOSIS — M25561 Pain in right knee: Secondary | ICD-10-CM

## 2022-10-26 DIAGNOSIS — R2689 Other abnormalities of gait and mobility: Secondary | ICD-10-CM | POA: Diagnosis not present

## 2022-10-26 DIAGNOSIS — M25661 Stiffness of right knee, not elsewhere classified: Secondary | ICD-10-CM | POA: Diagnosis not present

## 2022-10-26 NOTE — Therapy (Signed)
OUTPATIENT PHYSICAL THERAPY TREATMENT    Patient Name: Megan Crane MRN: [id number] DOB:03/22/1950, 72 y.o., female Today's Date: 10/26/2022   END OF SESSION:  PT End of Session - 10/26/22 1022     Visit Number 13    Date for PT Re-Evaluation 11/02/22    Authorization Type [employer]    Authorization Time Period 09/21/22-11/02/22    Authorization - Visit Number 13    Authorization - Number of Visits 16    Progress Note Due on Visit 15   MD PN on visit #5 - 10/02/22   PT Start Time 1015    PT Stop Time 1109    PT Time Calculation (min) 54 min    Activity Tolerance Patient tolerated treatment well    Behavior During Therapy [facility] for tasks assessed/performed;Anxious                       Past Medical History:  Diagnosis Date   Anxiety disorder    hx vasovagal responses   Arthritis    Cancer (HCC)    Deep vein thrombosis (DVT) (HCC)    hx of in left left after covid in 2021   Headache(784.0)    History of kidney stones    Hydronephrosis, right    Hyperlipidemia    Hypertension    Nephrolithiasis    Pneumonia    Positive nasal culture for methicillin resistant Staphylococcus aureus    Right ureteral stone    Thrombophlebitis of leg, left, superficial 11/17/2020   Urinary incontinence    sees Dr. McDiarmid    Vasovagal syncope 08/02/2022   Past Surgical History:  Procedure Laterality Date   BLADDER REPAIR     COLONOSCOPY  12/28/2015   per Dr. Adela Lank, serrated polyps and diverticula, repeat in 3 yrs    CYSTOSCOPY  06/09/2011   Procedure: CYSTOSCOPY;  Surgeon: Martina Sinner, MD;  Location: [facility];  Service: Urology;  Laterality: N/A;   CYSTOSCOPY W/ URETERAL STENT PLACEMENT  02/03/2014   Procedure: CYSTOSCOPY WITH  RIGHT RETROGRADE PYELOGRAM/ RIGHT URETERAL STENT PLACEMENT;  Surgeon: Anner Crete, MD;  Location: [facility];  Service: Urology;;   CYSTOSCOPY WITH BIOPSY  02/03/2014   Procedure: CYSTOSCOPY WITH BIOPSY;   Surgeon: Anner Crete, MD;  Location: [facility];  Service: Urology;;   PARTIAL KNEE ARTHROPLASTY Right 09/18/2022   Procedure: UNICOMPARTMENTAL KNEE;  Surgeon: Joen Laura, MD;  Location: [facility];  Service: Orthopedics;  Laterality: Right;   PILONIDAL CYST EXCISION  1974   RECTOCELE REPAIR  06/09/2011   Procedure: POSTERIOR REPAIR (RECTOCELE);  Surgeon: Martina Sinner, MD;  Location: [facility];  Service: Urology;  Laterality: N/A;  Xenform graft 6x10   RIGHT URETEROSCOPIC STONE EXTRACTION / STENT PLACEMENT  03/13/2000   URETEROSCOPY Right 02/03/2014   Procedure: URETEROSCOPY WITH MANAGEMENT OF URETERAL STRICTURE;  Surgeon: Anner Crete, MD;  Location: [facility];  Service: Urology;  Laterality: Right;   VAGINAL HYSTERECTOMY  06/09/2011   Procedure: HYSTERECTOMY VAGINAL;  Surgeon: Mickel Baas, MD;  Location: [facility];  Service: Gynecology;  Laterality: N/A;   VAGINAL PROLAPSE REPAIR  06/09/2011   Procedure: VAGINAL VAULT SUSPENSION;  Surgeon: Martina Sinner, MD;  Location: [facility];  Service: Urology;  Laterality: N/A;   VEIN LIGATION AND STRIPPING     LEFT LEG   Patient Active Problem List   Diagnosis Date Noted   S/P right unicompartmental knee replacement 09/18/2022  IC (interstitial cystitis) 09/04/2022   Pelvic pressure in female 08/22/2022   Visit for pre-operative examination 08/07/2022   Vasovagal syncope 08/02/2022   Nasal dryness 06/30/2022   Precancerous skin lesion 06/30/2022   Bilateral carpal tunnel syndrome 03/02/2022   Swelling of both hands 03/02/2022   Atypical chest pain 03/02/2022   OAB (overactive bladder) 11/23/2021   Corn of foot 11/23/2021   Atrophic vaginitis 11/23/2021   COVID-19 virus infection 11/17/2020   Raynaud's phenomenon without gangrene 11/17/2020   Primary osteoarthritis involving multiple joints 11/24/2019   GERD (gastroesophageal reflux disease) 12/26/2016   Edema 05/29/2014   Nasal colonization  with methicillin-resistant Staphylococcus aureus 01/22/2014   Varicose veins of bilateral lower extremities with other complications 12/11/2012   Allergic rhinitis 11/29/2009   MOTION SICKNESS 11/29/2009   Hyperlipidemia 08/31/2008   Anxiety state 08/31/2008   Primary hypertension 08/31/2008   PILONIDAL CYST 08/31/2008   HEADACHE 08/31/2008   URINARY INCONTINENCE 08/31/2008   NEPHROLITHIASIS, HX OF 08/31/2008   POSTNASAL DRIP SYNDROME 05/28/2006    PCP: Nelwyn Salisbury, MD   REFERRING PROVIDER: Joen Laura, MD   REFERRING DIAG: [phone] (ICD-10-CM) - Status post right partial knee replacement   THERAPY DIAG:  Stiffness of right knee, not elsewhere classified  Acute pain of right knee  Other abnormalities of gait and mobility  Muscle weakness (generalized)  Localized edema  RATIONALE FOR EVALUATION AND TREATMENT: Rehabilitation  ONSET DATE: 09/18/2022 - R unicompartmental knee replacement  NEXT MD VISIT: 10/31/2022    SUBJECTIVE:                                                                                                                                                                                                         SUBJECTIVE STATEMENT: Pt reports she hasn't been doing her exercises as much lately.   PAIN: Are you having pain? Yes: NPRS scale: 1/10 Pain location: R anterior lower leg Pain description: achy Aggravating factors: prolonged sitting and first getting up Relieving factors: walking  PERTINENT HISTORY:  OA, anxiety, HTN, Raynaud's phenomenon, GERD, B CTS, DVT s/p COVID in 2021, syncope  PRECAUTIONS: None  RED FLAGS: None  WEIGHT BEARING RESTRICTIONS: No  FALLS:  Has patient fallen in last 6 months? No  LIVING ENVIRONMENT: Lives with: lives with their spouse Lives in: House/apartment Stairs: Yes: External: 1+2 steps; none Has following equipment at home: Single point cane, Walker - 2 wheeled, Tour manager, and bed side  commode  OCCUPATION: Retired  PLOF: Independent and Leisure: reading, quilting, Training and development officer, yardwork, Geophysicist/field seismologist at [employer]  PATIENT GOALS: "Be able to go up/down steps in motor home so we can travel. Go back to work part-time."   OBJECTIVE: (objective measures completed at initial evaluation unless otherwise dated)  DIAGNOSTIC FINDINGS:  09/18/22 - DG R knee:  IMPRESSION: Medial compartment hemiarthroplasty without immediate postoperative complication.  PATIENT SURVEYS:  LEFS 13 / 80 = 16.3 %  COGNITION: Overall cognitive status: Within functional limits for tasks assessed    SENSATION: WFL  EDEMA:  Circumferential: 44 cm at joint line, 53 cm (5 cm above joint line)  MUSCLE LENGTH: Hamstrings: Mild/mod tight R>L ITB: Mild/mod tight R>L Piriformis: Mild tight R>L Hip flexors: Mild tight R>L Quads: Mod tight R>L Heelcord: NT  POSTURE:  No Significant postural limitations  PALPATION: R knee swollen and slightly warm to touch with mild erythema present.  Decreased R patellar mobility in all directions.  LOWER EXTREMITY ROM:  Active ROM Right eval Left eval R 09/27/22 R 09/29/22 R 10/02/22 R 10/06/22 R 10/12/22 R 10/19/22 R 10/26/22  Knee flexion 77 127 85 91 100 105 104 100 105  Knee extension -15 LAQ +2 -10 LAQ -9 LAQ -8 LAQ -7 LAQ -4 LAQ 0 LAQ 0- LAQ   Passive ROM Right eval R 09/27/22 R 10/02/22 R 10/12/22  Knee flexion 82 85    Knee extension -5 0 -1 0   LOWER EXTREMITY MMT:  MMT Right eval Left eval R 10/19/22 L 10/19/22  Hip flexion 4+ 5 4+ 5  Hip extension 4 4 4 4   Hip abduction 4- 4 4 4   Hip adduction 4 4+ 4+ 4+  Hip internal rotation 4 4+ 4+ 4+  Hip external rotation 4 4 4 4   Knee flexion 4 5 4+ 5  Knee extension 4  5 4+ 5  Ankle dorsiflexion 4 4 4 4   Ankle plantarflexion   4 (10 SLS HR) 4 (12 SLS HR)  Ankle inversion      Ankle eversion       (Blank rows = not tested)  FUNCTIONAL TESTS:  5 times sit to stand: 29.60 sec with weight  shift to L LE Timed up and go (TUG): 23.79 sec with RW  GAIT: Distance walked: Clinic distances Assistive device utilized: Environmental consultant - 2 wheeled Level of assistance: Modified independence Gait pattern: step through pattern and decreased hip/knee flexion- Right Comments: Ambulates with stiff R knee but good heel strike on weight acceptance   TODAY'S TREATMENT:  10/26/22 THERAPEUTIC EXERCISE: to improve flexibility, strength and mobility.  Demonstration, verbal and tactile cues throughout for technique. Rec Bike L1x11min Gait w/o AD 180 ft - increased antalgic gait at first but decreased as we continued walking Seated R hamstring + gastroc stretch with strap x 30 sec R single leg deadlift 4lb x 10 touch mat table Knee flexion 15# BLE 2x10  Knee mobilizations and PROM  10/24/22 THERAPEUTIC EXERCISE: to improve flexibility, strength and mobility.  Demonstration, verbal and tactile cues throughout for technique. Sit to stand with cues to keep feet even x 10  Single leg deadlift RLE reach to floor x 15 Step ups fwd 2 x 10 RLE; lateral 2 x 10 RLE 6' step Seated R hamstring curl GTB x 10 Seated R LAQ 3# x 10  Seated marches 3# x 10   10/19/22 GAIT TRAINING: To normalize gait pattern, improve safety with SPC, and prepare to wean from AD.  200 ft with [facility] emphasizing increased hip and knee flexion with heel strike on weight acceptance and heel-toe progression  THERAPEUTIC EXERCISE: to improve flexibility, strength and mobility.  Demonstration, verbal and tactile cues throughout for technique.  Rec Bike - partial to full revolutions L1 x 6 min B side-stepping with looped GTB at ankles 4 x 10 ft along counter Fwd/back monster walk 2 x 10 ft each direction, 1 hand on counter for balance  R fwd step-up to 6" step 2 x 10, 1 hand support for balance R lateral step-up to 6" step 2 x 10, 2 hand support for balance B lateral step-up & over 6" step x 10 Step stretch for R knee flexion    THERAPEUTIC ACTIVITIES: LE MMT R knee ROM assessment Discussed patient's concerns regarding airline travel while recovering from surgery  MODALITIES: Game Ready vasopneumatic compression post session to R knee x 10 min, medium compression, 34 to reduce post-exercise pain and swelling/edema   10/17/22 THERAPEUTIC EXERCISE: to improve flexibility, strength and mobility.  Demonstration, verbal and tactile cues throughout for technique.  Nustep L5x36min Bike L1x3 min - seat 7 Functional squat x 10 touching floor R single leg deadlift 2 x 10 touching 9' stool Clock balance 5x R/L 1/2 circle Standing R GTB 4-way SLR x 10 each direction bil - for review  MODALITIES: Game Ready vasopneumatic compression post session to R knee x 10 min, medium compression, 34 to reduce post-exercise pain and swelling/edema   10/12/22 THERAPEUTIC EXERCISE: to improve flexibility, strength and mobility.  Demonstration, verbal and tactile cues throughout for technique.  Rec Bike - L1 x 7 min Seated GTB R knee flexion/HS curls x 10 Seated GTB R knee extension/LAQ 2 x 10 Standing R/L GTB 4-way SLR x 10 each direction bil  THERAPEUTIC ACTIVITIES: Answered all of patient's questions related to mobility and ADL performance in home   MODALITIES: Game Ready vasopneumatic compression post session to R knee x 10 min, medium compression, 34 to reduce post-exercise pain and swelling/edema   PATIENT EDUCATION:  Education details: HEP update - GTB side-stepping and monster walk for glute strengthening and ankle stability; step stretch for R knee flexion ROM   Person educated: Patient Education method: Explanation, Demonstration, Tactile cues, Verbal cues, and Handouts Education comprehension: verbalized understanding, returned demonstration, verbal cues required, tactile cues required, and needs further education  HOME EXERCISE PROGRAM: Access Code: [email or web address] URL: [email or web address] Date:  10/19/2022 Prepared by: Glenetta Hew  Exercises - Supine Heel Slide with Strap  - 2 x daily - 7 x weekly - 2 sets - 10 reps - 3 sec hold - Heel Toe Raises with Counter Support  - 2 x daily - 7 x weekly - 2 sets - 10 reps - 3 sec hold - Standing Knee Flexion  - 2 x daily - 7 x weekly - 2 sets - 10 reps - 3 sec hold - Backward Weight Shift and Opposite Arm Raise with Walker  - 1 x daily - 7 x weekly - 2 sets - 10 reps - [place]  - 1 x daily - 7 x weekly - 2 sets - 10 reps - Sitting Knee Extension with Resistance  - 1 x daily - 7 x weekly - 2 sets - 10 reps - Seated Hamstring Curl with Anchored Resistance  - 1 x daily - 7 x weekly - 2 sets - 10 reps - Long Sitting 4 Way Patellar Glide  - 2-3 x daily - 7 x weekly - 2 sets - 10 reps - Standing Hip Flexion with Anchored Resistance and Chair Support  - 1 x daily -  3-4 x weekly - 2 sets - 10 reps - 3 sec hold - Standing Hip Adduction with Anchored Resistance  - 1 x daily - 3-4 x weekly - 2 sets - 10 reps - 3 sec hold - Standing Hip Extension with Anchored Resistance  - 1 x daily - 3-4 x weekly - 2 sets - 10 reps - 3 sec hold - Standing Hip Abduction with Anchored Resistance  - 1 x daily - 3-4 x weekly - 2 sets - 10 reps - 3 sec hold - Side Stepping with Resistance at Ankles  - 1 x daily - 3 x weekly - 2 sets - 10 reps - Band Walks  - 1 x daily - 3 x weekly - 2 sets - 10 reps - Standing Knee Flexion Stretch on Step  - 2 x daily - 7 x weekly - 2 sets - 10 reps - 5 sec hold  Patient Education - Rubbing with Different Textures   ASSESSMENT:  CLINICAL IMPRESSION: Pt now showing good walking pattern w/o her AD. At first she walked with a limp but this subsided after a while. She reported some pain in her R HS and gastroc, so focused stretching and strengthening with this. Close supervision and cuing needed for deadlifts. Knee flexion at 107 deg post session showing great improvement. Megan Crane is progressing well toward her goals and will benefit from  continued skilled PT to address ongoing pain, ROM and strength deficits to improve mobility and activity tolerance with decreased pain interference.   OBJECTIVE IMPAIRMENTS: Abnormal gait, decreased activity tolerance, decreased balance, decreased endurance, decreased knowledge of condition, decreased knowledge of use of DME, decreased mobility, difficulty walking, decreased ROM, decreased strength, decreased safety awareness, increased edema, increased fascial restrictions, impaired perceived functional ability, increased muscle spasms, impaired flexibility, and pain.   ACTIVITY LIMITATIONS: sitting, standing, squatting, sleeping, stairs, transfers, bed mobility, bathing, toileting, dressing, locomotion level, and caring for others  PARTICIPATION LIMITATIONS: meal prep, cleaning, laundry, driving, shopping, community activity, and yard work  PERSONAL FACTORS: Fitness, Past/current experiences, Time since onset of injury/illness/exacerbation, and 3+ comorbidities: OA, anxiety, HTN, Raynaud's phenomenon, GERD, B CTS, DVT s/p COVID in 2021, syncope  are also affecting patient's functional outcome.   REHAB POTENTIAL: Excellent  CLINICAL DECISION MAKING: Stable/uncomplicated  EVALUATION COMPLEXITY: Low   GOALS: Goals reviewed with patient? Yes  SHORT TERM GOALS: Target date: 10/12/2022  Patient will be independent with initial HEP. Baseline: pt performing hospital issued HEP Goal status: MET  10/02/22  2.  Patient will demonstrate improved R knee AROM to >/= 5-90 deg to allow for normal gait pattern. Baseline: R knee AROM -15-77, PROM -5-82 Goal status: MET  10/02/22 - R knee AROM -1-100, lacking 8 extension in LAQ  LONG TERM GOALS: Target date: 11/02/2022  Patient will be independent with advanced/ongoing HEP to improve outcomes and carryover.  Baseline:  Goal status: IN PROGRESS  10/19/22 - HEP updated today  2.  Patient will report at least 75% improvement in R knee pain to  improve QOL. Baseline: 5-6/10 Goal status: IN PROGRESS  10/12/22 - current reported pain 1.5/10   3.  Patient will demonstrate improved R knee AROM to >/= -2-120 deg to allow for normal gait and stair mechanics. Baseline: R knee AROM -15-77, PROM -5-82 Goal status: IN PROGRESS  10/19/22 - R knee AROM 0-100, with full extension to 0 in LAQ  4.  Patient will demonstrate improved B LE strength to >/= 4+/5 for improved stability and ease  of mobility. Baseline: refer to above LE MMT table Goal status: IN PROGRESS 10/19/22 - strength improving but R LE remains weaker with hip ABD, ext and ER 4/5, as well as B ankles 4/5  5.  Patient will be able to ambulate 600' with or w/o LRAD and normal gait pattern without increased pain to access community.  Baseline: antalgic gait with RW Goal status: IN PROGRESS  6. Patient will be able to ascend/descend stairs with 1 HR and reciprocal step pattern safely to access home and community.  Baseline: NT Goal status: IN PROGRESS  7.  Patient will report >/= 30/80 on LEFS to demonstrate improved functional ability. Baseline: 13 / 80 = 16.3 % Goal status: IN PROGRESS  8.  Patient will improve 5x STS time to </= 15 seconds with even weight shift to demonstrate improved functional strength and transfer efficiency . Baseline: 29.60 sec with weight shift to L LE Goal status: IN PROGRESS   9.  Patient will demonstrate decreased TUG time to </= 13.5 sec with or w/o LRAD to decrease risk for falls with transitional mobility Baseline: 23.79 sec with RW Goal status: IN PROGRESS   PLAN:  PT FREQUENCY: 2-3x/week (3x/wk x 2 wks, tapering to 2x/wk for duration of POC)  PT DURATION: 4-6 weeks  PLANNED INTERVENTIONS: Therapeutic exercises, Therapeutic activity, Neuromuscular re-education, Balance training, Gait training, Patient/Family education, Self Care, Joint mobilization, Stair training, DME instructions, Dry Needling, Electrical stimulation, Cryotherapy,  Moist heat, Taping, Vasopneumatic device, Ultrasound, Ionotophoresis 4mg /ml Dexamethasone, Manual therapy, and Re-evaluation  PLAN FOR NEXT SESSION: progress R knee ROM (emphasis on flexion ROM) and B LE strengthening - progress standing activities/functional strengthening; review & update HEP PRN; balance training; gait training to normalize gait pattern and work on weaning SPC as indicated; MT for increased R knee ROM and edema management; modalities including vaso for R knee pain and R LE edema management; weekly R knee AROM assessment  Darleene Cleaver, PTA 10/26/2022, 11:45 AM    Date of referral: 08/10/22 Referring provider: Joen Laura, MD Referring diagnosis? T55.732 (ICD-10-CM) - Status post right partial knee replacement  Treatment diagnosis? (if different than referring diagnosis)  Stiffness of right knee, not elsewhere classified  Acute pain of right knee  Other abnormalities of gait and mobility  Muscle weakness (generalized)  Localized edema  What was this (referring dx) caused by? Surgery (Type: R unicondylar knee replacement) and Arthritis  Nature of Condition: Initial Onset (within last 3 months)   Laterality: Rt  Current Functional Measure Score: LEFS 13 / 80 = 16.3 %  Objective measurements identify impairments when they are compared to normal values, the uninvolved extremity, and prior level of function.  [x]  Yes  []  No  Objective assessment of functional ability: Moderate functional limitations   Briefly describe symptoms: Current deficits include R knee pain, increased edema in R LE, limited and painful R knee ROM, R>L LE weakness and impaired ADLs, gait and mobility.  How did symptoms start: OA leading to R unicondylar knee replacement on 09/18/22  Average pain intensity:  Last 24 hours: 5-6/10  Past week: 5-6/10  How often does the pt experience symptoms? Constantly  How much have the symptoms interfered with usual daily activities? Quite a  bit  How has condition changed since care began at this facility? NA - initial visit  In general, how is the patients overall health? Very Good

## 2022-10-31 ENCOUNTER — Ambulatory Visit: Payer: Medicare Other

## 2022-10-31 DIAGNOSIS — R600 Localized edema: Secondary | ICD-10-CM | POA: Diagnosis not present

## 2022-10-31 DIAGNOSIS — M25661 Stiffness of right knee, not elsewhere classified: Secondary | ICD-10-CM

## 2022-10-31 DIAGNOSIS — M25561 Pain in right knee: Secondary | ICD-10-CM

## 2022-10-31 DIAGNOSIS — M6281 Muscle weakness (generalized): Secondary | ICD-10-CM | POA: Diagnosis not present

## 2022-10-31 DIAGNOSIS — M1711 Unilateral primary osteoarthritis, right knee: Secondary | ICD-10-CM | POA: Diagnosis not present

## 2022-10-31 DIAGNOSIS — R2689 Other abnormalities of gait and mobility: Secondary | ICD-10-CM | POA: Diagnosis not present

## 2022-10-31 NOTE — Therapy (Signed)
OUTPATIENT PHYSICAL THERAPY TREATMENT    Patient Name: Megan Crane MRN: [id number] DOB:1950/04/30, 72 y.o., female Today's Date: 10/31/2022   END OF SESSION:  PT End of Session - 10/31/22 1028     Visit Number 14    Date for PT Re-Evaluation 11/02/22    Authorization Type [employer] Medicare    Authorization Time Period 09/21/22-11/02/22    Authorization - Visit Number 14    Authorization - Number of Visits 16    Progress Note Due on Visit 15   MD PN on visit #5 - 10/02/22   PT Start Time 1019    PT Stop Time 1111    PT Time Calculation (min) 52 min    Activity Tolerance Patient tolerated treatment well    Behavior During Therapy [facility] for tasks assessed/performed;Anxious                        Past Medical History:  Diagnosis Date   Anxiety disorder    hx vasovagal responses   Arthritis    Cancer (HCC)    Deep vein thrombosis (DVT) (HCC)    hx of in left left after covid in 2021   Headache(784.0)    History of kidney stones    Hydronephrosis, right    Hyperlipidemia    Hypertension    Nephrolithiasis    Pneumonia    Positive nasal culture for methicillin resistant Staphylococcus aureus    Right ureteral stone    Thrombophlebitis of leg, left, superficial 11/17/2020   Urinary incontinence    sees Dr. McDiarmid    Vasovagal syncope 08/02/2022   Past Surgical History:  Procedure Laterality Date   BLADDER REPAIR     COLONOSCOPY  12/28/2015   per Dr. Adela Lank, serrated polyps and diverticula, repeat in 3 yrs    CYSTOSCOPY  06/09/2011   Procedure: CYSTOSCOPY;  Surgeon: Martina Sinner, MD;  Location: [facility];  Service: Urology;  Laterality: N/A;   CYSTOSCOPY W/ URETERAL STENT PLACEMENT  02/03/2014   Procedure: CYSTOSCOPY WITH  RIGHT RETROGRADE PYELOGRAM/ RIGHT URETERAL STENT PLACEMENT;  Surgeon: Anner Crete, MD;  Location: [facility];  Service: Urology;;   CYSTOSCOPY WITH BIOPSY  02/03/2014   Procedure: CYSTOSCOPY WITH BIOPSY;   Surgeon: Anner Crete, MD;  Location: [facility];  Service: Urology;;   PARTIAL KNEE ARTHROPLASTY Right 09/18/2022   Procedure: UNICOMPARTMENTAL KNEE;  Surgeon: Joen Laura, MD;  Location: [facility];  Service: Orthopedics;  Laterality: Right;   PILONIDAL CYST EXCISION  1974   RECTOCELE REPAIR  06/09/2011   Procedure: POSTERIOR REPAIR (RECTOCELE);  Surgeon: Martina Sinner, MD;  Location: [facility];  Service: Urology;  Laterality: N/A;  Xenform graft 6x10   RIGHT URETEROSCOPIC STONE EXTRACTION / STENT PLACEMENT  03/13/2000   URETEROSCOPY Right 02/03/2014   Procedure: URETEROSCOPY WITH MANAGEMENT OF URETERAL STRICTURE;  Surgeon: Anner Crete, MD;  Location: [facility];  Service: Urology;  Laterality: Right;   VAGINAL HYSTERECTOMY  06/09/2011   Procedure: HYSTERECTOMY VAGINAL;  Surgeon: Mickel Baas, MD;  Location: [facility];  Service: Gynecology;  Laterality: N/A;   VAGINAL PROLAPSE REPAIR  06/09/2011   Procedure: VAGINAL VAULT SUSPENSION;  Surgeon: Martina Sinner, MD;  Location: [facility];  Service: Urology;  Laterality: N/A;   VEIN LIGATION AND STRIPPING     LEFT LEG   Patient Active Problem List   Diagnosis Date Noted   S/P right unicompartmental knee replacement  09/18/2022   IC (interstitial cystitis) 09/04/2022   Pelvic pressure in female 08/22/2022   Visit for pre-operative examination 08/07/2022   Vasovagal syncope 08/02/2022   Nasal dryness 06/30/2022   Precancerous skin lesion 06/30/2022   Bilateral carpal tunnel syndrome 03/02/2022   Swelling of both hands 03/02/2022   Atypical chest pain 03/02/2022   OAB (overactive bladder) 11/23/2021   Corn of foot 11/23/2021   Atrophic vaginitis 11/23/2021   COVID-19 virus infection 11/17/2020   Raynaud's phenomenon without gangrene 11/17/2020   Primary osteoarthritis involving multiple joints 11/24/2019   GERD (gastroesophageal reflux disease) 12/26/2016   Edema 05/29/2014   Nasal colonization  with methicillin-resistant Staphylococcus aureus 01/22/2014   Varicose veins of bilateral lower extremities with other complications 12/11/2012   Allergic rhinitis 11/29/2009   MOTION SICKNESS 11/29/2009   Hyperlipidemia 08/31/2008   Anxiety state 08/31/2008   Primary hypertension 08/31/2008   PILONIDAL CYST 08/31/2008   HEADACHE 08/31/2008   URINARY INCONTINENCE 08/31/2008   NEPHROLITHIASIS, HX OF 08/31/2008   POSTNASAL DRIP SYNDROME 05/28/2006    PCP: Nelwyn Salisbury, MD   REFERRING PROVIDER: Joen Laura, MD   REFERRING DIAG: [phone] (ICD-10-CM) - Status post right partial knee replacement   THERAPY DIAG:  Stiffness of right knee, not elsewhere classified  Acute pain of right knee  Other abnormalities of gait and mobility  Muscle weakness (generalized)  Localized edema  RATIONALE FOR EVALUATION AND TREATMENT: Rehabilitation  ONSET DATE: 09/18/2022 - R unicompartmental knee replacement  NEXT MD VISIT: 10/31/2022    SUBJECTIVE:                                                                                                                                                                                                         SUBJECTIVE STATEMENT: Pt reports walking at home w/o cane but still doesn't feel ready to get rid of it completely yet.  PAIN: Are you having pain? Yes: NPRS scale: 1/10 Pain location: R anterior lower leg Pain description: achy Aggravating factors: prolonged sitting and first getting up Relieving factors: walking  PERTINENT HISTORY:  OA, anxiety, HTN, Raynaud's phenomenon, GERD, B CTS, DVT s/p COVID in 2021, syncope  PRECAUTIONS: None  RED FLAGS: None  WEIGHT BEARING RESTRICTIONS: No  FALLS:  Has patient fallen in last 6 months? No  LIVING ENVIRONMENT: Lives with: lives with their spouse Lives in: House/apartment Stairs: Yes: External: 1+2 steps; none Has following equipment at home: Single point cane, Walker - 2 wheeled,  Shower bench, and bed side commode  OCCUPATION: Retired  PLOF: Independent and  Leisure: reading, quilting, cross-stitch, yardwork, Silver Sneakers at [employer] ~2x/month  PATIENT GOALS: "Be able to go up/down steps in motor home so we can travel. Go back to work part-time."   OBJECTIVE: (objective measures completed at initial evaluation unless otherwise dated)  DIAGNOSTIC FINDINGS:  09/18/22 - DG R knee:  IMPRESSION: Medial compartment hemiarthroplasty without immediate postoperative complication.  PATIENT SURVEYS:  LEFS 13 / 80 = 16.3 %  COGNITION: Overall cognitive status: Within functional limits for tasks assessed    SENSATION: WFL  EDEMA:  Circumferential: 44 cm at joint line, 53 cm (5 cm above joint line)  MUSCLE LENGTH: Hamstrings: Mild/mod tight R>L ITB: Mild/mod tight R>L Piriformis: Mild tight R>L Hip flexors: Mild tight R>L Quads: Mod tight R>L Heelcord: NT  POSTURE:  No Significant postural limitations  PALPATION: R knee swollen and slightly warm to touch with mild erythema present.  Decreased R patellar mobility in all directions.  LOWER EXTREMITY ROM:  Active ROM Right eval Left eval R 09/27/22 R 09/29/22 R 10/02/22 R 10/06/22 R 10/12/22 R 10/19/22 R 10/26/22  Knee flexion 77 127 85 91 100 105 104 100 105  Knee extension -15 LAQ +2 -10 LAQ -9 LAQ -8 LAQ -7 LAQ -4 LAQ 0 LAQ 0- LAQ   Passive ROM Right eval R 09/27/22 R 10/02/22 R 10/12/22  Knee flexion 82 85    Knee extension -5 0 -1 0   LOWER EXTREMITY MMT:  MMT Right eval Left eval R 10/19/22 L 10/19/22  Hip flexion 4+ 5 4+ 5  Hip extension 4 4 4 4   Hip abduction 4- 4 4 4   Hip adduction 4 4+ 4+ 4+  Hip internal rotation 4 4+ 4+ 4+  Hip external rotation 4 4 4 4   Knee flexion 4 5 4+ 5  Knee extension 4  5 4+ 5  Ankle dorsiflexion 4 4 4 4   Ankle plantarflexion   4 (10 SLS HR) 4 (12 SLS HR)  Ankle inversion      Ankle eversion       (Blank rows = not tested)  FUNCTIONAL TESTS:  5 times sit to stand:  29.60 sec with weight shift to L LE Timed up and go (TUG): 23.79 sec with RW  GAIT: Distance walked: Clinic distances Assistive device utilized: Environmental consultant - 2 wheeled Level of assistance: Modified independence Gait pattern: step through pattern and decreased hip/knee flexion- Right Comments: Ambulates with stiff R knee but good heel strike on weight acceptance   TODAY'S TREATMENT:  10/31/22 THERAPEUTIC EXERCISE: to improve flexibility, strength and mobility.  Demonstration, verbal and tactile cues throughout for technique. Rec Bike L1x13min Gait w/o AD 270 ft - increased antalgic gait at first but decreased as we continued walking  Therapeutic Activity: to improve functional performance. Step ups 8' RLE x 10  Lateral step ups 8' RLE x 10  Stairs: 14 stairs ascending/descending- able to do reciprocal pattern but pulls herself going up on RLE, very hesitant with eccentric control on RLE going down but able to complete step down Fwd step downs 2' 2x10 RLE Standing R lunge stretch x 10  MODALITIES: Game Ready vasopneumatic compression post session to R knee x 10 min, medium compression, 34 to reduce post-exercise pain and swelling/edema  10/26/22 THERAPEUTIC EXERCISE: to improve flexibility, strength and mobility.  Demonstration, verbal and tactile cues throughout for technique. Rec Bike L1x70min Gait w/o AD 180 ft - increased antalgic gait at first but decreased as we continued walking Seated R hamstring + gastroc  stretch with strap x 30 sec R single leg deadlift 4lb x 10 touch mat table Knee flexion 15# BLE 2x10  Knee mobilizations and PROM  10/24/22 THERAPEUTIC EXERCISE: to improve flexibility, strength and mobility.  Demonstration, verbal and tactile cues throughout for technique. Sit to stand with cues to keep feet even x 10  Single leg deadlift RLE reach to floor x 15 Step ups fwd 2 x 10 RLE; lateral 2 x 10 RLE 6' step Seated R hamstring curl GTB x 10 Seated R LAQ 3# x 10   Seated marches 3# x 10   10/19/22 GAIT TRAINING: To normalize gait pattern, improve safety with SPC, and prepare to wean from AD.  200 ft with [facility] emphasizing increased hip and knee flexion with heel strike on weight acceptance and heel-toe progression   THERAPEUTIC EXERCISE: to improve flexibility, strength and mobility.  Demonstration, verbal and tactile cues throughout for technique.  Rec Bike - partial to full revolutions L1 x 6 min B side-stepping with looped GTB at ankles 4 x 10 ft along counter Fwd/back monster walk 2 x 10 ft each direction, 1 hand on counter for balance  R fwd step-up to 6" step 2 x 10, 1 hand support for balance R lateral step-up to 6" step 2 x 10, 2 hand support for balance B lateral step-up & over 6" step x 10 Step stretch for R knee flexion   THERAPEUTIC ACTIVITIES: LE MMT R knee ROM assessment Discussed patient's concerns regarding airline travel while recovering from surgery  MODALITIES: Game Ready vasopneumatic compression post session to R knee x 10 min, medium compression, 34 to reduce post-exercise pain and swelling/edema   10/17/22 THERAPEUTIC EXERCISE: to improve flexibility, strength and mobility.  Demonstration, verbal and tactile cues throughout for technique.  Nustep L5x53min Bike L1x3 min - seat 7 Functional squat x 10 touching floor R single leg deadlift 2 x 10 touching 9' stool Clock balance 5x R/L 1/2 circle Standing R GTB 4-way SLR x 10 each direction bil - for review  MODALITIES: Game Ready vasopneumatic compression post session to R knee x 10 min, medium compression, 34 to reduce post-exercise pain and swelling/edema   10/12/22 THERAPEUTIC EXERCISE: to improve flexibility, strength and mobility.  Demonstration, verbal and tactile cues throughout for technique.  Rec Bike - L1 x 7 min Seated GTB R knee flexion/HS curls x 10 Seated GTB R knee extension/LAQ 2 x 10 Standing R/L GTB 4-way SLR x 10 each direction bil  THERAPEUTIC  ACTIVITIES: Answered all of patient's questions related to mobility and ADL performance in home   MODALITIES: Game Ready vasopneumatic compression post session to R knee x 10 min, medium compression, 34 to reduce post-exercise pain and swelling/edema   PATIENT EDUCATION:  Education details: HEP update - GTB side-stepping and monster walk for glute strengthening and ankle stability; step stretch for R knee flexion ROM   Person educated: Patient Education method: Explanation, Demonstration, Tactile cues, Verbal cues, and Handouts Education comprehension: verbalized understanding, returned demonstration, verbal cues required, tactile cues required, and needs further education  HOME EXERCISE PROGRAM: Access Code: [email or web address] URL: [email or web address] Date: 10/19/2022 Prepared by: Glenetta Hew  Exercises - Supine Heel Slide with Strap  - 2 x daily - 7 x weekly - 2 sets - 10 reps - 3 sec hold - Heel Toe Raises with Counter Support  - 2 x daily - 7 x weekly - 2 sets - 10 reps - 3 sec hold - Standing Knee Flexion  -  2 x daily - 7 x weekly - 2 sets - 10 reps - 3 sec hold - Backward Weight Shift and Opposite Arm Raise with Walker  - 1 x daily - 7 x weekly - 2 sets - 10 reps - [place]  - 1 x daily - 7 x weekly - 2 sets - 10 reps - Sitting Knee Extension with Resistance  - 1 x daily - 7 x weekly - 2 sets - 10 reps - Seated Hamstring Curl with Anchored Resistance  - 1 x daily - 7 x weekly - 2 sets - 10 reps - Long Sitting 4 Way Patellar Glide  - 2-3 x daily - 7 x weekly - 2 sets - 10 reps - Standing Hip Flexion with Anchored Resistance and Chair Support  - 1 x daily - 3-4 x weekly - 2 sets - 10 reps - 3 sec hold - Standing Hip Adduction with Anchored Resistance  - 1 x daily - 3-4 x weekly - 2 sets - 10 reps - 3 sec hold - Standing Hip Extension with Anchored Resistance  - 1 x daily - 3-4 x weekly - 2 sets - 10 reps - 3 sec hold - Standing Hip Abduction with Anchored Resistance  - 1  x daily - 3-4 x weekly - 2 sets - 10 reps - 3 sec hold - Side Stepping with Resistance at Ankles  - 1 x daily - 3 x weekly - 2 sets - 10 reps - Band Walks  - 1 x daily - 3 x weekly - 2 sets - 10 reps - Standing Knee Flexion Stretch on Step  - 2 x daily - 7 x weekly - 2 sets - 10 reps - 5 sec hold  Patient Education - Rubbing with Different Textures   ASSESSMENT:  CLINICAL IMPRESSION: Focused session on stair climbing to improve functional performance with stairs and confidence. She still does up with good and down with bad, so we are trying to get her to walk more reciprocally with stairs. Also worked on gait w/o AD for the session as well which she is coming along with. Game ready post session for edema and PN sent to MD last visit. Balee is progressing well toward her goals and will benefit from continued skilled PT to address ongoing ROM and strength deficits to improve mobility and activity tolerance with decreased pain interference.   OBJECTIVE IMPAIRMENTS: Abnormal gait, decreased activity tolerance, decreased balance, decreased endurance, decreased knowledge of condition, decreased knowledge of use of DME, decreased mobility, difficulty walking, decreased ROM, decreased strength, decreased safety awareness, increased edema, increased fascial restrictions, impaired perceived functional ability, increased muscle spasms, impaired flexibility, and pain.   ACTIVITY LIMITATIONS: sitting, standing, squatting, sleeping, stairs, transfers, bed mobility, bathing, toileting, dressing, locomotion level, and caring for others  PARTICIPATION LIMITATIONS: meal prep, cleaning, laundry, driving, shopping, community activity, and yard work  PERSONAL FACTORS: Fitness, Past/current experiences, Time since onset of injury/illness/exacerbation, and 3+ comorbidities: OA, anxiety, HTN, Raynaud's phenomenon, GERD, B CTS, DVT s/p COVID in 2021, syncope  are also affecting patient's functional outcome.   REHAB  POTENTIAL: Excellent  CLINICAL DECISION MAKING: Stable/uncomplicated  EVALUATION COMPLEXITY: Low   GOALS: Goals reviewed with patient? Yes  SHORT TERM GOALS: Target date: 10/12/2022  Patient will be independent with initial HEP. Baseline: pt performing hospital issued HEP Goal status: MET  10/02/22  2.  Patient will demonstrate improved R knee AROM to >/= 5-90 deg to allow for normal gait pattern. Baseline:  R knee AROM -15-77, PROM -5-82 Goal status: MET  10/02/22 - R knee AROM -1-100, lacking 8 extension in LAQ  LONG TERM GOALS: Target date: 11/02/2022  Patient will be independent with advanced/ongoing HEP to improve outcomes and carryover.  Baseline:  Goal status: IN PROGRESS  10/19/22 - HEP updated today  2.  Patient will report at least 75% improvement in R knee pain to improve QOL. Baseline: 5-6/10 Goal status: IN PROGRESS  10/12/22 - current reported pain 1.5/10   3.  Patient will demonstrate improved R knee AROM to >/= -2-120 deg to allow for normal gait and stair mechanics. Baseline: R knee AROM -15-77, PROM -5-82 Goal status: IN PROGRESS  10/19/22 - R knee AROM 0-100, with full extension to 0 in LAQ  4.  Patient will demonstrate improved B LE strength to >/= 4+/5 for improved stability and ease of mobility. Baseline: refer to above LE MMT table Goal status: IN PROGRESS 10/19/22 - strength improving but R LE remains weaker with hip ABD, ext and ER 4/5, as well as B ankles 4/5  5.  Patient will be able to ambulate 600' with or w/o LRAD and normal gait pattern without increased pain to access community.  Baseline: antalgic gait with RW Goal status: IN PROGRESS  6. Patient will be able to ascend/descend stairs with 1 HR and reciprocal step pattern safely to access home and community.  Baseline: NT Goal status: IN PROGRESS  7.  Patient will report >/= 30/80 on LEFS to demonstrate improved functional ability. Baseline: 13 / 80 = 16.3 % Goal status: IN  PROGRESS  8.  Patient will improve 5x STS time to </= 15 seconds with even weight shift to demonstrate improved functional strength and transfer efficiency . Baseline: 29.60 sec with weight shift to L LE Goal status: IN PROGRESS   9.  Patient will demonstrate decreased TUG time to </= 13.5 sec with or w/o LRAD to decrease risk for falls with transitional mobility Baseline: 23.79 sec with RW Goal status: IN PROGRESS   PLAN:  PT FREQUENCY: 2-3x/week (3x/wk x 2 wks, tapering to 2x/wk for duration of POC)  PT DURATION: 4-6 weeks  PLANNED INTERVENTIONS: Therapeutic exercises, Therapeutic activity, Neuromuscular re-education, Balance training, Gait training, Patient/Family education, Self Care, Joint mobilization, Stair training, DME instructions, Dry Needling, Electrical stimulation, Cryotherapy, Moist heat, Taping, Vasopneumatic device, Ultrasound, Ionotophoresis 4mg /ml Dexamethasone, Manual therapy, and Re-evaluation  PLAN FOR NEXT SESSION: progress R knee ROM (emphasis on flexion ROM) and B LE strengthening - progress standing activities/functional strengthening; review & update HEP PRN; balance training; gait training to normalize gait pattern and work on weaning SPC as indicated; MT for increased R knee ROM and edema management; modalities including vaso for R knee pain and R LE edema management; weekly R knee AROM assessment  Darleene Cleaver, PTA 10/31/2022, 11:04 AM    Date of referral: 08/10/22 Referring provider: Joen Laura, MD Referring diagnosis? Z61.096 (ICD-10-CM) - Status post right partial knee replacement  Treatment diagnosis? (if different than referring diagnosis)  Stiffness of right knee, not elsewhere classified  Acute pain of right knee  Other abnormalities of gait and mobility  Muscle weakness (generalized)  Localized edema  What was this (referring dx) caused by? Surgery (Type: R unicondylar knee replacement) and Arthritis  Nature of Condition:  Initial Onset (within last 3 months)   Laterality: Rt  Current Functional Measure Score: LEFS 13 / 80 = 16.3 %  Objective measurements identify impairments when they are  compared to normal values, the uninvolved extremity, and prior level of function.  [x]  Yes  []  No  Objective assessment of functional ability: Moderate functional limitations   Briefly describe symptoms: Current deficits include R knee pain, increased edema in R LE, limited and painful R knee ROM, R>L LE weakness and impaired ADLs, gait and mobility.  How did symptoms start: OA leading to R unicondylar knee replacement on 09/18/22  Average pain intensity:  Last 24 hours: 5-6/10  Past week: 5-6/10  How often does the pt experience symptoms? Constantly  How much have the symptoms interfered with usual daily activities? Quite a bit  How has condition changed since care began at this facility? NA - initial visit  In general, how is the patients overall health? Very Good

## 2022-11-01 ENCOUNTER — Other Ambulatory Visit: Payer: Self-pay | Admitting: Family

## 2022-11-01 ENCOUNTER — Ambulatory Visit: Payer: Medicare Other

## 2022-11-01 VITALS — BP 120/60 | HR 94 | Temp 98.8°F | Ht 66.0 in | Wt 217.8 lb

## 2022-11-01 DIAGNOSIS — Z0000 Encounter for general adult medical examination without abnormal findings: Secondary | ICD-10-CM

## 2022-11-01 DIAGNOSIS — Z23 Encounter for immunization: Secondary | ICD-10-CM

## 2022-11-01 DIAGNOSIS — I10 Essential (primary) hypertension: Secondary | ICD-10-CM

## 2022-11-01 NOTE — Patient Instructions (Addendum)
Megan Crane , Thank you for taking time to come for your Medicare Wellness Visit. I appreciate your ongoing commitment to your health goals. Please review the following plan we discussed and let me know if I can assist you in the future.   Referrals/Orders/Follow-Ups/Clinician Recommendations:   This is a list of the screening recommended for you and due dates:  Health Maintenance  Topic Date Due   Hepatitis C Screening  Never done   DEXA scan (bone density measurement)  10/14/2017   Pneumonia Vaccine (2 of 2 - PPSV23 or PCV20) 10/02/2019   DTaP/Tdap/Td vaccine (2 - Td or Tdap) 07/23/2022   Flu Shot  08/10/2022   COVID-19 Vaccine (4 - 2023-24 season) 09/10/2022   Zoster (Shingles) Vaccine (1 of 2) 12/05/2022*   Medicare Annual Wellness Visit  11/01/2023   Mammogram  01/26/2024   Colon Cancer Screening  04/27/2026   HPV Vaccine  Aged Out  *Topic was postponed. The date shown is not the original due date.    Advanced directives: (Copy Requested) Please bring a copy of your health care power of attorney and living will to the office to be added to your chart at your convenience.  Next Medicare Annual Wellness Visit scheduled for next year: Yes

## 2022-11-01 NOTE — Progress Notes (Signed)
Subjective:   Megan Crane is a 72 y.o. female who presents for Medicare Annual (Subsequent) preventive examination.  Visit Complete: In person    Cardiac Risk Factors include: advanced age (>30men, >23 women);hypertension     Objective:    Today's Vitals   11/01/22 1310  BP: 120/60  Pulse: 94  Temp: 98.8 F (37.1 C)  TempSrc: Oral  SpO2: 97%  Weight: 217 lb 12.8 oz (98.8 kg)  Height: 5\' 6"  (1.676 m)   Body mass index is 35.15 kg/m.     11/01/2022    1:21 PM 09/21/2022   10:20 AM 09/18/2022   10:00 PM 01/13/2022    1:23 AM 01/13/2022    1:19 AM 10/28/2021   10:45 AM 03/25/2021    5:36 PM  Advanced Directives  Does Patient Have a Medical Advance Directive? Yes Yes Yes No No Yes No  Type of Estate agent of [place];Living will Healthcare Power of [place];Living will Healthcare Power of [employer] of [place];Living will   Does patient want to make changes to medical advance directive?  No - Patient declined No - Patient declined    No - Patient declined  Copy of Healthcare Power of Attorney in Chart? No - copy requested     No - copy requested   Would patient like information on creating a medical advance directive?    No - Patient declined No - Patient declined      Current Medications (verified) Outpatient Encounter Medications as of 11/01/2022  Medication Sig   amitriptyline (ELAVIL) 25 MG tablet TAKE 1/2 TABLET BY MOUTH EVERY NIGHT AT BEDTIME FOR 1 WEEK, THEN 1 TABLET DAILY FOR 1 WEEK, THEN 2 TABLETS DAILY FOR 1 WEEK, THEN 3 TABLETS BY MOUTH DAILY   apixaban (ELIQUIS) 2.5 MG TABS tablet Take 1 tablet (2.5 mg total) by mouth 2 (two) times daily.   estradiol (ESTRACE) 0.1 MG/GM vaginal cream Place 1 Applicatorful vaginally 2 (two) times a week.   hydrochlorothiazide (HYDRODIURIL) 25 MG tablet Take 1 tablet (25 mg total) by mouth daily.   omeprazole (PRILOSEC) 40 MG capsule Take 1 capsule (40 mg total) by mouth daily for 28 days.    oxybutynin (DITROPAN-XL) 5 MG 24 hr tablet Take 1 tablet (5 mg total) by mouth at bedtime.   polyethylene glycol (MIRALAX) 17 g packet Take 17 g by mouth daily.   valsartan (DIOVAN) 40 MG tablet TAKE 1 TABLET BY MOUTH DAILY   No facility-administered encounter medications on file as of 11/01/2022.    Allergies (verified) Lidocaine, Escitalopram, Levofloxacin, and Oxycodone-aspirin   History: Past Medical History:  Diagnosis Date   Anxiety disorder    hx vasovagal responses   Arthritis    Cancer (HCC)    Deep vein thrombosis (DVT) (HCC)    hx of in left left after covid in 2021   Headache(784.0)    History of kidney stones    Hydronephrosis, right    Hyperlipidemia    Hypertension    Nephrolithiasis    Pneumonia    Positive nasal culture for methicillin resistant Staphylococcus aureus    Right ureteral stone    Thrombophlebitis of leg, left, superficial 11/17/2020   Urinary incontinence    sees Dr. McDiarmid    Vasovagal syncope 08/02/2022   Past Surgical History:  Procedure Laterality Date   BLADDER REPAIR     COLONOSCOPY  12/28/2015   per Dr. Adela Lank, serrated polyps and diverticula, repeat in 3 yrs  CYSTOSCOPY  06/09/2011   Procedure: CYSTOSCOPY;  Surgeon: Martina Sinner, MD;  Location: [facility];  Service: Urology;  Laterality: N/A;   CYSTOSCOPY W/ URETERAL STENT PLACEMENT  02/03/2014   Procedure: CYSTOSCOPY WITH  RIGHT RETROGRADE PYELOGRAM/ RIGHT URETERAL STENT PLACEMENT;  Surgeon: Anner Crete, MD;  Location: [facility];  Service: Urology;;   CYSTOSCOPY WITH BIOPSY  02/03/2014   Procedure: CYSTOSCOPY WITH BIOPSY;  Surgeon: Anner Crete, MD;  Location: [facility];  Service: Urology;;   PARTIAL KNEE ARTHROPLASTY Right 09/18/2022   Procedure: UNICOMPARTMENTAL KNEE;  Surgeon: Joen Laura, MD;  Location: [facility];  Service: Orthopedics;  Laterality: Right;   PILONIDAL CYST EXCISION  1974   RECTOCELE REPAIR  06/09/2011    Procedure: POSTERIOR REPAIR (RECTOCELE);  Surgeon: Martina Sinner, MD;  Location: [facility];  Service: Urology;  Laterality: N/A;  Xenform graft 6x10   RIGHT URETEROSCOPIC STONE EXTRACTION / STENT PLACEMENT  03/13/2000   URETEROSCOPY Right 02/03/2014   Procedure: URETEROSCOPY WITH MANAGEMENT OF URETERAL STRICTURE;  Surgeon: Anner Crete, MD;  Location: [facility];  Service: Urology;  Laterality: Right;   VAGINAL HYSTERECTOMY  06/09/2011   Procedure: HYSTERECTOMY VAGINAL;  Surgeon: Mickel Baas, MD;  Location: [facility];  Service: Gynecology;  Laterality: N/A;   VAGINAL PROLAPSE REPAIR  06/09/2011   Procedure: VAGINAL VAULT SUSPENSION;  Surgeon: Martina Sinner, MD;  Location: [facility];  Service: Urology;  Laterality: N/A;   VEIN LIGATION AND STRIPPING     LEFT LEG   Family History  Problem Relation Age of Onset   Colon cancer Mother 53   Colon polyps Father    Cancer Father        kidney, bladder    Colon polyps Brother    Coronary artery disease Other    Diabetes Other    Hyperlipidemia Other    Kidney disease Other    Cancer Other        lung   Esophageal cancer Neg Hx    Stomach cancer Neg Hx    Rectal cancer Neg Hx    Social History   Socioeconomic History   Marital status: Married    Spouse name: Not on file   Number of children: Not on file   Years of education: Not on file   Highest education level: Not on file  Occupational History   Not on file  Tobacco Use   Smoking status: Former    Types: Cigarettes   Smokeless tobacco: Never  Vaping Use   Vaping status: Never Used  Substance and Sexual Activity   Alcohol use: Yes    Comment: once a month   Drug use: No   Sexual activity: Not Currently    Partners: Male  Other Topics Concern   Not on file  Social History Narrative   Married to a Truck driver   2 grown daughters   One daughter in [place]- no children, divorced   One daughter in [place], 3 children   Went to [employer] in 2020, [place]   Looking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   Enjoys quilting/cross stitch, motor home, travelling   2 dogs    Best friend is Therapist, nutritional Gum   Social Determinants of Health   Financial Resource Strain: Low Risk  (11/01/2022)   Overall Financial Resource Strain (CARDIA)    Difficulty of Paying Living Expenses: Not hard at all  Food Insecurity: No Food Insecurity (11/01/2022)  Hunger Vital Sign    Worried About Running Out of Food in the Last Year: Never true    Ran Out of Food in the Last Year: Never true  Transportation Needs: No Transportation Needs (11/01/2022)   PRAPARE - Administrator, Civil Service (Medical): No    Lack of Transportation (Non-Medical): No  Physical Activity: Insufficiently Active (11/01/2022)   Exercise Vital Sign    Days of Exercise per Week: 3 days    Minutes of Exercise per Session: 20 min  Stress: No Stress Concern Present (11/01/2022)   [employer] of Occupational Health - Occupational Stress Questionnaire    Feeling of Stress : Not at all  Social Connections: Socially Integrated (11/01/2022)   Social Connection and Isolation Panel [NHANES]    Frequency of Communication with Friends and Family: More than three times a week    Frequency of Social Gatherings with Friends and Family: More than three times a week    Attends Religious Services: More than 4 times per year    Active Member of [employer] or Organizations: Yes    Attends Engineer, structural: More than 4 times per year    Marital Status: Married    Tobacco Counseling Counseling given: Not Answered   Clinical Intake:  Pre-visit preparation completed: Yes  Pain : No/denies pain     BMI - recorded: 35.15 Nutritional Status: BMI > 30  Obese Nutritional Risks: None Diabetes: No  How often do you need to have someone help you when you read instructions, pamphlets, or other written materials from your doctor or pharmacy?: 1 - Never  Interpreter Needed?:  No  Information entered by :: Theresa Mulligan LPN   Activities of Daily Living    11/01/2022    1:18 PM 09/18/2022   10:00 PM  In your present state of health, do you have any difficulty performing the following activities:  Hearing? 0 0  Vision? 0 0  Difficulty concentrating or making decisions? 0 0  Walking or climbing stairs? 1 0  Comment Uses Cane due to rt knee surgery   Dressing or bathing? 0 0  Doing errands, shopping? 0 0  Preparing Food and eating ? N   Using the Toilet? N   In the past six months, have you accidently leaked urine? N   Do you have problems with loss of bowel control? N   Managing your Medications? N   Managing your Finances? N   Housekeeping or managing your Housekeeping? N     Patient Care Team: Nelwyn Salisbury, MD as PCP - General (Family Medicine) Meriam Sprague, MD (Inactive) as PCP - Cardiology (Cardiology)  Indicate any recent Medical Services you may have received from other than [facility] providers in the past year (date may be approximate).     Assessment:   This is a routine wellness examination for Kida.  Hearing/Vision screen Hearing Screening - Comments:: Denies hearing difficulties   Vision Screening - Comments:: Wears rx glasses - up to date with routine eye exams with  Dr Emily Filbert   Goals Addressed               This Visit's Progress     Increase physical activity (pt-stated)        I want to lose about 50 lbs.       Depression Screen    11/01/2022    1:17 PM 06/30/2022    1:53 PM 01/17/2022   10:57 AM 11/23/2021  10:10 AM 10/28/2021   10:38 AM 10/18/2021   11:25 AM 08/22/2021    1:55 PM  PHQ 2/9 Scores  PHQ - 2 Score 0 0 0 0 0 0 0  PHQ- 9 Score  0 0  0 0 0    Fall Risk    11/01/2022    1:19 PM 06/30/2022    1:53 PM 01/17/2022   10:57 AM 11/23/2021   10:10 AM 10/28/2021   10:40 AM  Fall Risk   Falls in the past year? 0 0 1 0 1  Number falls in past yr: 0 0 0 0 0  Injury with Fall? 0 0 0 0 1  Comment      Patient stated Re injury to rt arm. No medical attention needed  Risk for fall due to : No Fall Risks No Fall Risks No Fall Risks No Fall Risks No Fall Risks  Follow up Falls prevention discussed Falls evaluation completed Falls evaluation completed Falls evaluation completed Falls prevention discussed    MEDICARE RISK AT HOME: Medicare Risk at Home Any stairs in or around the home?: No If so, are there any without handrails?: No Home free of loose throw rugs in walkways, pet beds, electrical cords, etc?: Yes Adequate lighting in your home to reduce risk of falls?: Yes Life alert?: No Use of a cane, walker or w/c?: Yes Grab bars in the bathroom?: No Shower chair or bench in shower?: No Elevated toilet seat or a handicapped toilet?: Yes  TIMED UP AND GO:  Was the test performed?  Yes  Length of time to ambulate 10 feet: 10 sec Gait steady and fast without use of assistive device    Cognitive Function:        11/01/2022    1:21 PM 10/28/2021   10:45 AM  6CIT Screen  What Year? 0 points 0 points  What month? 0 points 0 points  What time? 0 points 0 points  Count back from 20 0 points 0 points  Months in reverse 0 points 0 points  Repeat phrase 0 points 0 points  Total Score 0 points 0 points    Immunizations Immunization History  Administered Date(s) Administered   Fluad Quad(high Dose 65+) 09/10/2018, 11/24/2019, 12/15/2020, 10/28/2021   Hepatitis B 05/08/2008   Influenza Split 11/23/2010, 01/12/2012, 12/11/2012, 09/19/2013, 12/22/2014, 09/29/2015, 09/21/2016, 10/05/2017, 09/10/2018, 11/24/2019   Influenza Whole 10/20/2008   Influenza,inj,Quad PF,6+ Mos 12/11/2012, 09/19/2013, 12/22/2014, 09/29/2015, 09/21/2016, 10/05/2017   Influenza-Unspecified 09/10/2015   MMR 09/09/1998   PFIZER(Purple Top)SARS-COV-2 Vaccination 02/18/2019, 03/13/2019, 10/30/2019   Pneumococcal Conjugate-13 10/02/2018   Tdap 07/22/2012    TDAP status: Due, Education has been provided regarding  the importance of this vaccine. Advised may receive this vaccine at local pharmacy or Health Dept. Aware to provide a copy of the vaccination record if obtained from local pharmacy or Health Dept. Verbalized acceptance and understanding.  Flu Vaccine status: Due, Education has been provided regarding the importance of this vaccine. Advised may receive this vaccine at local pharmacy or Health Dept. Aware to provide a copy of the vaccination record if obtained from local pharmacy or Health Dept. Verbalized acceptance and understanding.  Pneumococcal vaccine status: Up to date  Covid-19 vaccine status: Declined, Education has been provided regarding the importance of this vaccine but patient still declined. Advised may receive this vaccine at local pharmacy or Health Dept.or vaccine clinic. Aware to provide a copy of the vaccination record if obtained from local pharmacy or Health Dept. Verbalized acceptance  and understanding.  Qualifies for Shingles Vaccine? Yes   Zostavax completed No   Shingrix Completed?: No.    Education has been provided regarding the importance of this vaccine. Patient has been advised to call insurance company to determine out of pocket expense if they have not yet received this vaccine. Advised may also receive vaccine at local pharmacy or Health Dept. Verbalized acceptance and understanding.  Screening Tests Health Maintenance  Topic Date Due   Hepatitis C Screening  Never done   DEXA SCAN  10/14/2017   Pneumonia Vaccine 60+ Years old (2 of 2 - PPSV23 or PCV20) 10/02/2019   DTaP/Tdap/Td (2 - Td or Tdap) 07/23/2022   INFLUENZA VACCINE  08/10/2022   COVID-19 Vaccine (4 - 2023-24 season) 09/10/2022   Zoster Vaccines- Shingrix (1 of 2) 12/05/2022 (Originally 10/15/1969)   Medicare Annual Wellness (AWV)  11/01/2023   MAMMOGRAM  01/26/2024   Colonoscopy  04/27/2026   HPV VACCINES  Aged Out    Health Maintenance  Health Maintenance Due  Topic Date Due   Hepatitis C  Screening  Never done   DEXA SCAN  10/14/2017   Pneumonia Vaccine 82+ Years old (2 of 2 - PPSV23 or PCV20) 10/02/2019   DTaP/Tdap/Td (2 - Td or Tdap) 07/23/2022   INFLUENZA VACCINE  08/10/2022   COVID-19 Vaccine (4 - 2023-24 season) 09/10/2022    Colorectal cancer screening: Type of screening: Colonoscopy. Completed 04/26/21. Repeat every 5 years  Mammogram status: Completed 01/25/22. Repeat every year 2  Bone Density status: Ordered Deferred. Pt provided with contact info and advised to call to schedule appt.    Additional Screening:  Hepatitis C Screening: does qualify;  Deferred  Vision Screening: Recommended annual ophthalmology exams for early detection of glaucoma and other disorders of the eye. Is the patient up to date with their annual eye exam?  Yes  Who is the provider or what is the name of the office in which the patient attends annual eye exams? Dr Emily Filbert If pt is not established with a provider, would they like to be referred to a provider to establish care? No .   Dental Screening: Recommended annual dental exams for proper oral hygiene    Community Resource Referral / Chronic Care Management:  CRR required this visit?  No   CCM required this visit?  No     Plan:     I have personally reviewed and noted the following in the patient's chart:   Medical and social history Use of alcohol, tobacco or illicit drugs  Current medications and supplements including opioid prescriptions. Patient is not currently taking opioid prescriptions. Functional ability and status Nutritional status Physical activity Advanced directives List of other physicians Hospitalizations, surgeries, and ER visits in previous 12 months Vitals Screenings to include cognitive, depression, and falls Referrals and appointments  In addition, I have reviewed and discussed with patient certain preventive protocols, quality metrics, and best practice recommendations. A written personalized  care plan for preventive services as well as general preventive health recommendations were provided to patient.     Tillie Rung, LPN   16/10/9602   After Visit Summary: Given  Nurse Notes: None

## 2022-11-02 ENCOUNTER — Encounter: Payer: Self-pay | Admitting: Physical Therapy

## 2022-11-02 ENCOUNTER — Ambulatory Visit: Payer: Medicare Other | Admitting: Physical Therapy

## 2022-11-02 DIAGNOSIS — M6281 Muscle weakness (generalized): Secondary | ICD-10-CM | POA: Diagnosis not present

## 2022-11-02 DIAGNOSIS — R600 Localized edema: Secondary | ICD-10-CM | POA: Diagnosis not present

## 2022-11-02 DIAGNOSIS — R2689 Other abnormalities of gait and mobility: Secondary | ICD-10-CM

## 2022-11-02 DIAGNOSIS — M25661 Stiffness of right knee, not elsewhere classified: Secondary | ICD-10-CM | POA: Diagnosis not present

## 2022-11-02 DIAGNOSIS — M25561 Pain in right knee: Secondary | ICD-10-CM

## 2022-11-02 NOTE — Therapy (Addendum)
OUTPATIENT PHYSICAL THERAPY TREATMENT / RECERTIFICATION  Progress Note  Reporting Period 10/02/2022 to 11/02/2022   See note below for Objective Data and Assessment of Progress/Goals.    Patient Name: Megan Crane MRN: [id number] DOB:[birth year], 72 y.o., female Today's Date: 11/02/2022   END OF SESSION:  PT End of Session - 11/02/22 1014     Visit Number 15    Date for PT Re-Evaluation 11/30/22    Authorization Type [employer]    Authorization Time Period 09/21/22-11/02/22    Authorization - Visit Number 15    Authorization - Number of Visits 16    Progress Note Due on Visit 25   Recert on visit #15 - 11/02/22   PT Start Time 1014    PT Stop Time 1104    PT Time Calculation (min) 50 min    Activity Tolerance Patient tolerated treatment well    Behavior During Therapy WFL for tasks assessed/performed                         Past Medical History:  Diagnosis Date   Anxiety disorder    hx vasovagal responses   Arthritis    Cancer (HCC)    Deep vein thrombosis (DVT) (HCC)    hx of in left left after covid in 2021   Headache(784.0)    History of kidney stones    Hydronephrosis, right    Hyperlipidemia    Hypertension    Nephrolithiasis    Pneumonia    Positive nasal culture for methicillin resistant Staphylococcus aureus    Right ureteral stone    Thrombophlebitis of leg, left, superficial 11/17/2020   Urinary incontinence    sees Dr. McDiarmid    Vasovagal syncope 08/02/2022   Past Surgical History:  Procedure Laterality Date   BLADDER REPAIR     COLONOSCOPY  12/28/2015   per Dr. Adela Lank, serrated polyps and diverticula, repeat in 3 yrs    CYSTOSCOPY  06/09/2011   Procedure: CYSTOSCOPY;  Surgeon: Martina Sinner, MD;  Location: [facility];  Service: Urology;  Laterality: N/A;   CYSTOSCOPY W/ URETERAL STENT PLACEMENT  02/03/2014   Procedure: CYSTOSCOPY WITH  RIGHT RETROGRADE PYELOGRAM/ RIGHT URETERAL STENT PLACEMENT;  Surgeon: Anner Crete, MD;  Location: [facility];  Service: Urology;;   CYSTOSCOPY WITH BIOPSY  02/03/2014   Procedure: CYSTOSCOPY WITH BIOPSY;  Surgeon: Anner Crete, MD;  Location: [facility];  Service: Urology;;   PARTIAL KNEE ARTHROPLASTY Right 09/18/2022   Procedure: UNICOMPARTMENTAL KNEE;  Surgeon: Joen Laura, MD;  Location: [facility];  Service: Orthopedics;  Laterality: Right;   PILONIDAL CYST EXCISION  1974   RECTOCELE REPAIR  06/09/2011   Procedure: POSTERIOR REPAIR (RECTOCELE);  Surgeon: Martina Sinner, MD;  Location: [facility];  Service: Urology;  Laterality: N/A;  Xenform graft 6x10   RIGHT URETEROSCOPIC STONE EXTRACTION / STENT PLACEMENT  03/13/2000   URETEROSCOPY Right 02/03/2014   Procedure: URETEROSCOPY WITH MANAGEMENT OF URETERAL STRICTURE;  Surgeon: Anner Crete, MD;  Location: [facility];  Service: Urology;  Laterality: Right;   VAGINAL HYSTERECTOMY  06/09/2011   Procedure: HYSTERECTOMY VAGINAL;  Surgeon: Mickel Baas, MD;  Location: [facility];  Service: Gynecology;  Laterality: N/A;   VAGINAL PROLAPSE REPAIR  06/09/2011   Procedure: VAGINAL VAULT SUSPENSION;  Surgeon: Martina Sinner, MD;  Location: [facility];  Service: Urology;  Laterality: N/A;   VEIN LIGATION AND STRIPPING  LEFT LEG   Patient Active Problem List   Diagnosis Date Noted   S/P right unicompartmental knee replacement 09/18/2022   IC (interstitial cystitis) 09/04/2022   Pelvic pressure in female 08/22/2022   Visit for pre-operative examination 08/07/2022   Vasovagal syncope 08/02/2022   Nasal dryness 06/30/2022   Precancerous skin lesion 06/30/2022   Bilateral carpal tunnel syndrome 03/02/2022   Swelling of both hands 03/02/2022   Atypical chest pain 03/02/2022   OAB (overactive bladder) 11/23/2021   Corn of foot 11/23/2021   Atrophic vaginitis 11/23/2021   COVID-19 virus infection 11/17/2020   Raynaud's phenomenon without gangrene 11/17/2020   Primary  osteoarthritis involving multiple joints 11/24/2019   GERD (gastroesophageal reflux disease) 12/26/2016   Edema 05/29/2014   Nasal colonization with methicillin-resistant Staphylococcus aureus 01/22/2014   Varicose veins of bilateral lower extremities with other complications 12/11/2012   Allergic rhinitis 11/29/2009   MOTION SICKNESS 11/29/2009   Hyperlipidemia 08/31/2008   Anxiety state 08/31/2008   Primary hypertension 08/31/2008   PILONIDAL CYST 08/31/2008   HEADACHE 08/31/2008   URINARY INCONTINENCE 08/31/2008   NEPHROLITHIASIS, HX OF 08/31/2008   POSTNASAL DRIP SYNDROME 05/28/2006    PCP: Nelwyn Salisbury, MD   REFERRING PROVIDER: Joen Laura, MD   REFERRING DIAG: [phone] (ICD-10-CM) - Status post right partial knee replacement   THERAPY DIAG:  Stiffness of right knee, not elsewhere classified  Acute pain of right knee  Other abnormalities of gait and mobility  Muscle weakness (generalized)  Localized edema  RATIONALE FOR EVALUATION AND TREATMENT: Rehabilitation  ONSET DATE: 09/18/2022 - R unicompartmental knee replacement  NEXT MD VISIT: 12/12/2022    SUBJECTIVE:                                                                                                                                                                                                         SUBJECTIVE STATEMENT: Pt reports her biggest limitations are still with bending her knee, navigating stairs and getting a normal pace with walking. She notes increased soreness after completing the band exercises at home.  PAIN: Are you having pain? No  PERTINENT HISTORY:  OA, anxiety, HTN, Raynaud's phenomenon, GERD, B CTS, DVT s/p COVID in 2021, syncope  PRECAUTIONS: None  RED FLAGS: None  WEIGHT BEARING RESTRICTIONS: No  FALLS:  Has patient fallen in last 6 months? 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 Go back to work part-time."   OBJECTIVE: (objective measures completed at initial evaluation unless otherwise dated)  DIAGNOSTIC FINDINGS:  09/18/22 - DG R knee:  IMPRESSION: Medial compartment hemiarthroplasty without immediate postoperative complication.  PATIENT SURVEYS:  LEFS 13 / 80 = 16.3 %  11/02/22: 44 / 80 = 55.0 %  COGNITION: Overall cognitive status: Within functional limits for tasks assessed    SENSATION: WFL  EDEMA:  Circumferential: 44 cm at joint line, 53 cm (5 cm above joint line)  MUSCLE LENGTH: Hamstrings: Mild/mod tight R>L ITB: Mild/mod tight R>L Piriformis: Mild tight R>L Hip flexors: Mild tight R>L Quads: Mod tight R>L Heelcord: NT  POSTURE:  No Significant postural limitations  PALPATION: R knee swollen and slightly warm to touch with mild erythema present.  Decreased R patellar mobility in all directions.  LOWER EXTREMITY ROM:  Active ROM Right eval Left eval R 09/27/22 R 09/29/22 R 10/02/22 R 10/06/22 R 10/12/22 R 10/19/22 R 10/26/22 R 11/02/22  Knee flexion 77 127 85 91 100 105 104 100 105 107   Knee extension -15 LAQ +2 -10 LAQ -9 LAQ -8 LAQ -7 LAQ -4 LAQ 0 LAQ 0 LAQ 0 LAQ   Passive ROM Right eval R 09/27/22 R 10/02/22 R 10/12/22  Knee flexion 82 85    Knee extension -5 0 -1 0   LOWER EXTREMITY MMT:  MMT Right eval Left eval R 10/19/22 L 10/19/22 R 11/02/22 L 11/02/22  Hip flexion 4+ 5 4+ 5 4+ 5  Hip extension 4 4 4 4 4 4   Hip abduction 4- 4 4 4  4- 4  Hip adduction 4 4+ 4+ 4+ 4+ 4+  Hip internal rotation 4 4+ 4+ 4+ 4+ 5  Hip external rotation 4 4 4 4  4- 4  Knee flexion 4 5 4+ 5 4+ 5  Knee extension 4  5 4+ 5 4+ 5  Ankle  dorsiflexion 4 4 4 4 4  4+  Ankle plantarflexion   4 (10 SLS HR) 4 (12 SLS HR)    Ankle inversion        Ankle eversion         (Blank rows = not tested)  FUNCTIONAL TESTS:  5 times sit to stand: 29.60 sec with weight shift to L LE Timed up and go (TUG): 23.79 sec with RW  GAIT: Distance walked: Clinic distances Assistive device utilized: Environmental consultant - 2 wheeled Level of assistance: Modified independence Gait pattern: step through pattern and decreased hip/knee flexion- Right Comments: Ambulates with stiff R knee but good heel strike on weight acceptance   TODAY'S TREATMENT:   11/02/22 - Recert THERAPEUTIC EXERCISE: to improve flexibility, strength and mobility.  Demonstration, verbal and tactile cues throughout for technique.  Rec Bike - L2 x 6 min  THERAPEUTIC ACTIVITIES: LEFS: 44 / 80 = 55.0 % 5xSTS = 14.91 sec w/o UE assist & even weight shift TUG = 9.78 sec w/o AD ROM and MMT assessment  Goal assessment  GAIT TRAINING: To normalize gait pattern and prepare to wean from AD. 300 ft w/o AD - mild L Trendelenburg hip drop due to glute medius weakness Stairs: Level of Assistance: Modified independence Stair Negotiation Technique: Alternating Pattern  Forwards with Single Rail on Left Number of Stairs: 14  Height of Stairs: 7"  Comments:  increased effort advancing L LE to next step during R stance phase on ascent due to continued quad and glute medius weakness; decreased R eccentric quad control on descent leading to compensatory hip drop  MANUAL THERAPY: To promote normalized muscle tension, improved flexibility, improved joint mobility, and increased ROM. Hooklying tibiofemoral AP mobs to increase R knee flexion Inferior patellar glide to increase R knee flexion Gravity assisted stretch and contract/relax into R knee flexion with R LE suspended over PT's arm   10/31/22 THERAPEUTIC EXERCISE: to improve flexibility, strength and mobility.  Demonstration, verbal and tactile  cues throughout for technique. Rec Bike L1x47min Gait w/o AD 270 ft - increased antalgic gait at first but decreased as we continued walking  Therapeutic Activity: to improve functional performance. Step ups 8' RLE x 10  Lateral step ups 8' RLE x 10  Stairs: 14 stairs ascending/descending- able to do reciprocal pattern but pulls herself going up on RLE, very hesitant with eccentric control on RLE going down but able to complete step down Fwd step downs 2' 2x10 RLE Standing R lunge stretch x 10  MODALITIES: Game Ready vasopneumatic compression post session to R knee x 10 min, medium compression, 34 to reduce post-exercise pain and swelling/edema   10/26/22 THERAPEUTIC EXERCISE: to improve flexibility, strength and mobility.  Demonstration, verbal and tactile cues throughout for technique. Rec Bike L1x61min Gait w/o AD 180 ft - increased antalgic gait at first but decreased as we continued walking Seated R hamstring + gastroc stretch with strap x 30 sec R single leg deadlift 4lb x 10 touch mat table Knee flexion 15# BLE 2x10  Knee mobilizations and PROM  10/24/22 THERAPEUTIC EXERCISE: to improve flexibility, strength and mobility.  Demonstration, verbal and tactile cues throughout for technique. Sit to stand with cues to keep feet even x 10  Single leg deadlift RLE reach to floor x 15 Step ups fwd 2 x 10 RLE; lateral 2 x 10 RLE 6' step Seated R hamstring curl GTB x 10 Seated R LAQ 3# x 10  Seated marches 3# x 10    PATIENT EDUCATION:  Education details: progress with PT, ongoing PT POC, and continue with current HEP  Person educated: Patient Education method: Medical illustrator Education comprehension: verbalized understanding and needs further education  HOME EXERCISE PROGRAM: Access Code: [email or web address] URL: [email or web address] Date: 10/19/2022 Prepared by: Glenetta Hew  Exercises - Supine Heel Slide with Strap  - 2 x daily - 7 x weekly - 2 sets - 10  reps - 3 sec hold - Heel Toe Raises with Counter Support  - 2 x daily - 7 x weekly - 2 sets - 10 reps - 3 sec hold - Standing Knee Flexion  - 2 x daily - 7 x weekly - 2 sets - 10 reps - 3 sec hold - Backward Weight Shift and Opposite Arm Raise with Walker  - 1 x daily - 7 x weekly - 2 sets - 10 reps - [place]  - 1 x daily - 7 x weekly - 2 sets - 10 reps - Sitting Knee Extension with Resistance  - 1 x daily - 7 x weekly - 2 sets - 10 reps - Seated Hamstring Curl with Anchored Resistance  - 1 x daily - 7 x weekly - 2 sets - 10 reps - Long Sitting 4 Way Patellar Glide  - 2-3 x daily - 7 x weekly - 2 sets - 10 reps - Standing Hip Flexion with Anchored Resistance and  Chair Support  - 1 x daily - 3-4 x weekly - 2 sets - 10 reps - 3 sec hold - Standing Hip Adduction with Anchored Resistance  - 1 x daily - 3-4 x weekly - 2 sets - 10 reps - 3 sec hold - Standing Hip Extension with Anchored Resistance  - 1 x daily - 3-4 x weekly - 2 sets - 10 reps - 3 sec hold - Standing Hip Abduction with Anchored Resistance  - 1 x daily - 3-4 x weekly - 2 sets - 10 reps - 3 sec hold - Side Stepping with Resistance at Ankles  - 1 x daily - 3 x weekly - 2 sets - 10 reps - Band Walks  - 1 x daily - 3 x weekly - 2 sets - 10 reps - Standing Knee Flexion Stretch on Step  - 2 x daily - 7 x weekly - 2 sets - 10 reps - 5 sec hold  Patient Education - Rubbing with Different Textures   ASSESSMENT:  CLINICAL IMPRESSION: Shakiyla has demonstrated good progress with PT s/p R unicompartmental knee replacement on 09/18/2022.  She reports 90-95% improvement in her R knee pain.  She has mostly weaned to no assistive device with gait, keeping SPC only for comfort at times due to stiffness after periods of inactivity.  She does still demonstrate a mild Trendelenburg gait with left hip drop due to right glutes medius weakness.  This is also evident during stair negotiation with additional limitation also noted due to L quad eccentric  weakness on descent.  L knee ROM continues to slowly improve with full active knee extension to 0 now achieved and flexion range of motion currently progressed up to 107.  MMT revealing ongoing R>L LE weakness with greatest deficits in B hip extension and abduction (glutes), R hip ER, and eccentric R quad weakness.  Laelani is progressing well toward her goals but will benefit from continued skilled PT to address ongoing ROM and strength deficits to improve mobility and activity tolerance with decreased pain interference, therefore will recommend recert for continued PT 2x/wk for up to 4 weeks.   OBJECTIVE IMPAIRMENTS: Abnormal gait, decreased activity tolerance, decreased balance, decreased endurance, decreased knowledge of condition, decreased knowledge of use of DME, decreased mobility, difficulty walking, decreased ROM, decreased strength, decreased safety awareness, increased edema, increased fascial restrictions, impaired perceived functional ability, increased muscle spasms, impaired flexibility, and pain.   ACTIVITY LIMITATIONS: sitting, standing, squatting, sleeping, stairs, transfers, bed mobility, bathing, toileting, dressing, locomotion level, and caring for others  PARTICIPATION LIMITATIONS: meal prep, cleaning, laundry, driving, shopping, community activity, and yard work  PERSONAL FACTORS: Fitness, Past/current experiences, Time since onset of injury/illness/exacerbation, and 3+ comorbidities: OA, anxiety, HTN, Raynaud's phenomenon, GERD, B CTS, DVT s/p COVID in 2021, syncope  are also affecting patient's functional outcome.   REHAB POTENTIAL: Excellent  CLINICAL DECISION MAKING: Stable/uncomplicated  EVALUATION COMPLEXITY: Low   GOALS: Goals reviewed with patient? Yes  SHORT TERM GOALS: Target date: 10/12/2022  Patient will be independent with initial HEP. Baseline: pt performing hospital issued HEP Goal status: MET  10/02/22  2.  Patient will demonstrate improved R knee  AROM to >/= 5-90 deg to allow for normal gait pattern. Baseline: R knee AROM -15-77, PROM -5-82 Goal status: MET  10/02/22 - R knee AROM -1-100, lacking 8 extension in LAQ  LONG TERM GOALS: Target date: 11/02/2022; extended to 11/30/2022  Patient will be independent with advanced/ongoing HEP to improve outcomes and carryover.  Baseline:  Goal status: PARTIALLY MET  10/19/22 - Met for current HEP  2.  Patient will report at least 75% improvement in R knee pain to improve QOL. Baseline: 5-6/10 Goal status: MET  11/02/22 - 90-95% improvement in pain   3.  Patient will demonstrate improved R knee AROM to >/= -2-120 deg to allow for normal gait and stair mechanics. Baseline: R knee AROM -15-77, PROM -5-82 Goal status: IN PROGRESS  11/02/22 - R knee AROM 0-107, with full extension to 0 in LAQ  4.  Patient will demonstrate improved B LE strength to >/= 4+/5 for improved stability and ease of mobility. Baseline: refer to above LE MMT table Goal status: IN PROGRESS 11/02/22 - strength improving but R LE remains weaker than L (refer to above LE MMT table)  5.  Patient will be able to ambulate 600' with or w/o LRAD and normal gait pattern without increased pain to access community.  Baseline: antalgic gait with RW Goal status: IN PROGRESS  11/02/22 - 300' with mild L Trendelenburg gait pattern noted without AD  6. Patient will be able to ascend/descend stairs with 1 HR and reciprocal step pattern safely to access home and community.  Baseline: NT Goal status: IN PROGRESS  11/02/22 - increased effort advancing L LE to next step during R stance phase on ascent due to continued quad and glute medius weakness; decreased R eccentric quad control on descent leading to compensatory hip drop  7.  Patient will report >/= 30/80 on LEFS to demonstrate improved functional ability. Baseline: 13 / 80 = 16.3 % Goal status: MET  11/02/22 - 44 / 80 = 55.0 %  8.  Patient will improve 5x STS time to </=  15 seconds with even weight shift to demonstrate improved functional strength and transfer efficiency . Baseline: 29.60 sec with weight shift to L LE Goal status: MET  11/02/22 - 14.91 sec  9.  Patient will demonstrate decreased TUG time to </= 13.5 sec with or w/o LRAD to decrease risk for falls with transitional mobility Baseline: 23.79 sec with RW Goal status: MET  11/02/22 - 9.78 sec   PLAN:  PT FREQUENCY: 2x/week   PT DURATION: 4 weeks  PLANNED INTERVENTIONS: 97164- PT Re-evaluation, 97110-Therapeutic exercises, 97530- Therapeutic activity, 97112- Neuromuscular re-education, 97535- Self Care, 16109- Manual therapy, 97116- Gait training, 97014- Electrical stimulation (unattended), 97016- Vasopneumatic device, 97035- Ultrasound, 60454- Ionotophoresis 4mg /ml Dexamethasone, Patient/Family education, Balance training, Stair training, Taping, Dry Needling, Joint mobilization, DME instructions, Cryotherapy, Moist heat, Therapeutic exercises, Therapeutic activity, Neuromuscular re-education, Gait training, and Self Care  PLAN FOR NEXT SESSION: review & update HEP PRN - add quad stretching and eccentric quad & glute med strengthening; progress R knee ROM (emphasis on flexion ROM) and B LE strengthening - progress standing activities/functional strengthening; balance training; gait training to normalize gait pattern w/o AD; MT for increased R knee ROM and edema management; modalities including vaso for R knee pain and R LE edema management; weekly R knee AROM assessment  Marry Guan, PT 11/02/2022, 11:09 AM    Date of referral: 08/10/22 Referring provider: Joen Laura, MD Referring diagnosis? U98.119 (ICD-10-CM) - Status post right partial knee replacement  Treatment diagnosis? (if different than referring diagnosis)  Stiffness of right knee, not elsewhere classified  Acute pain of right knee  Other abnormalities of gait and mobility  Muscle weakness (generalized)  Localized  edema  What was this (referring dx) caused by? Surgery (Type: R unicondylar knee replacement)  and Arthritis  Nature of Condition: Initial Onset (within last 3 months)   Laterality: Rt  Current Functional Measure Score: LEFS 44 / 80 = 55.0 %  Objective measurements identify impairments when they are compared to normal values, the uninvolved extremity, and prior level of function.  [x]  Yes  []  No  Objective assessment of functional ability: Moderate functional limitations   Briefly describe symptoms: Ongoing deficits include intermittent R knee pain, increased edema in R LE, limited and painful endrange R knee flexion ROM, R>L LE weakness and impaired ADLs, gait and mobility, especially stair negotiation.  How did symptoms start: OA leading to R unicondylar knee replacement on 09/18/22  Average pain intensity:  Last 24 hours: 0-1/10  Past week: 1/10  How often does the pt experience symptoms? Intermittently  How much have the symptoms interfered with usual daily activities? Moderately  How has condition changed since care began at this facility? Better  In general, how is the patients overall health? Very Good

## 2022-11-07 ENCOUNTER — Ambulatory Visit: Payer: Medicare Other | Admitting: Physical Therapy

## 2022-11-07 NOTE — Therapy (Signed)
OUTPATIENT PHYSICAL THERAPY TREATMENT NOTE  Patient Name: Megan Crane MRN: [id number] DOB:[birth year], 72 y.o., female Today's Date: 11/08/2022   END OF SESSION:  PT End of Session - 11/08/22 1449     Visit Number 16    Date for PT Re-Evaluation 11/30/22    Authorization Type [employer]    Authorization - Number of Visits 16    PT Start Time 1448    PT Stop Time 1532    PT Time Calculation (min) 44 min    Activity Tolerance Patient tolerated treatment well    Behavior During Therapy WFL for tasks assessed/performed                          Past Medical History:  Diagnosis Date   Anxiety disorder    hx vasovagal responses   Arthritis    Cancer (HCC)    Deep vein thrombosis (DVT) (HCC)    hx of in left left after covid in 2021   Headache(784.0)    History of kidney stones    Hydronephrosis, right    Hyperlipidemia    Hypertension    Nephrolithiasis    Pneumonia    Positive nasal culture for methicillin resistant Staphylococcus aureus    Right ureteral stone    Thrombophlebitis of leg, left, superficial 11/17/2020   Urinary incontinence    sees Dr. McDiarmid    Vasovagal syncope 08/02/2022   Past Surgical History:  Procedure Laterality Date   BLADDER REPAIR     COLONOSCOPY  12/28/2015   per Dr. Adela Lank, serrated polyps and diverticula, repeat in 3 yrs    CYSTOSCOPY  06/09/2011   Procedure: CYSTOSCOPY;  Surgeon: Martina Sinner, MD;  Location: [facility];  Service: Urology;  Laterality: N/A;   CYSTOSCOPY W/ URETERAL STENT PLACEMENT  02/03/2014   Procedure: CYSTOSCOPY WITH  RIGHT RETROGRADE PYELOGRAM/ RIGHT URETERAL STENT PLACEMENT;  Surgeon: Anner Crete, MD;  Location: [facility];  Service: Urology;;   CYSTOSCOPY WITH BIOPSY  02/03/2014   Procedure: CYSTOSCOPY WITH BIOPSY;  Surgeon: Anner Crete, MD;  Location: [facility];  Service: Urology;;   PARTIAL KNEE ARTHROPLASTY Right 09/18/2022   Procedure:  UNICOMPARTMENTAL KNEE;  Surgeon: Joen Laura, MD;  Location: [facility];  Service: Orthopedics;  Laterality: Right;   PILONIDAL CYST EXCISION  1974   RECTOCELE REPAIR  06/09/2011   Procedure: POSTERIOR REPAIR (RECTOCELE);  Surgeon: Martina Sinner, MD;  Location: [facility];  Service: Urology;  Laterality: N/A;  Xenform graft 6x10   RIGHT URETEROSCOPIC STONE EXTRACTION / STENT PLACEMENT  03/13/2000   URETEROSCOPY Right 02/03/2014   Procedure: URETEROSCOPY WITH MANAGEMENT OF URETERAL STRICTURE;  Surgeon: Anner Crete, MD;  Location: [facility];  Service: Urology;  Laterality: Right;   VAGINAL HYSTERECTOMY  06/09/2011   Procedure: HYSTERECTOMY VAGINAL;  Surgeon: Mickel Baas, MD;  Location: [facility];  Service: Gynecology;  Laterality: N/A;   VAGINAL PROLAPSE REPAIR  06/09/2011   Procedure: VAGINAL VAULT SUSPENSION;  Surgeon: Martina Sinner, MD;  Location: [facility];  Service: Urology;  Laterality: N/A;   VEIN LIGATION AND STRIPPING     LEFT LEG   Patient Active Problem List   Diagnosis Date Noted   S/P right unicompartmental knee replacement 09/18/2022   IC (interstitial cystitis) 09/04/2022   Pelvic pressure in female 08/22/2022   Visit for pre-operative examination 08/07/2022   Vasovagal syncope 08/02/2022   Nasal dryness 06/30/2022  Precancerous skin lesion 06/30/2022   Bilateral carpal tunnel syndrome 03/02/2022   Swelling of both hands 03/02/2022   Atypical chest pain 03/02/2022   OAB (overactive bladder) 11/23/2021   Corn of foot 11/23/2021   Atrophic vaginitis 11/23/2021   COVID-19 virus infection 11/17/2020   Raynaud's phenomenon without gangrene 11/17/2020   Primary osteoarthritis involving multiple joints 11/24/2019   GERD (gastroesophageal reflux disease) 12/26/2016   Edema 05/29/2014   Nasal colonization with methicillin-resistant Staphylococcus aureus 01/22/2014   Varicose veins of bilateral lower extremities with other complications 12/11/2012    Allergic rhinitis 11/29/2009   MOTION SICKNESS 11/29/2009   Hyperlipidemia 08/31/2008   Anxiety state 08/31/2008   Primary hypertension 08/31/2008   PILONIDAL CYST 08/31/2008   HEADACHE 08/31/2008   URINARY INCONTINENCE 08/31/2008   NEPHROLITHIASIS, HX OF 08/31/2008   POSTNASAL DRIP SYNDROME 05/28/2006    PCP: Nelwyn Salisbury, MD   REFERRING PROVIDER: Joen Laura, MD   REFERRING DIAG: [phone] (ICD-10-CM) - Status post right partial knee replacement   THERAPY DIAG:  Stiffness of right knee, not elsewhere classified  Acute pain of right knee  Other abnormalities of gait and mobility  Muscle weakness (generalized)  Localized edema  RATIONALE FOR EVALUATION AND TREATMENT: Rehabilitation  ONSET DATE: 09/18/2022 - R unicompartmental knee replacement  NEXT MD VISIT: 12/12/2022    SUBJECTIVE:                                                                                                                                                                                                         SUBJECTIVE STATEMENT: My leg is sore today, not so much my knee. I feel out of breath a little today. I fell asleep in the chair for 1.5 hours.  PAIN: Are you having pain? No  PERTINENT HISTORY:  OA, anxiety, HTN, Raynaud's phenomenon, GERD, B CTS, DVT s/p COVID in 2021, syncope  PRECAUTIONS: None  RED FLAGS: None  WEIGHT BEARING RESTRICTIONS: No  FALLS:  Has patient fallen in last 6 months? 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 Go back to work part-time."   OBJECTIVE:  (objective measures completed at initial evaluation unless otherwise dated)  DIAGNOSTIC FINDINGS:  09/18/22 -  DG R knee:  IMPRESSION: Medial compartment hemiarthroplasty without immediate postoperative complication.  PATIENT SURVEYS:  LEFS 13 / 80 = 16.3 %  11/02/22: 44 / 80 = 55.0 %  COGNITION: Overall cognitive status: Within functional limits for tasks assessed    SENSATION: WFL  EDEMA:  Circumferential: 44 cm at joint line, 53 cm (5 cm above joint line)  MUSCLE LENGTH: Hamstrings: Mild/mod tight R>L ITB: Mild/mod tight R>L Piriformis: Mild tight R>L Hip flexors: Mild tight R>L Quads: Mod tight R>L Heelcord: NT  POSTURE:  No Significant postural limitations  PALPATION: R knee swollen and slightly warm to touch with mild erythema present.  Decreased R patellar mobility in all directions.  LOWER EXTREMITY ROM:  Active ROM Right eval Left eval R 09/27/22 R 09/29/22 R 10/02/22 R 10/06/22 R 10/12/22 R 10/19/22 R 10/26/22 R 11/02/22  Knee flexion 77 127 85 91 100 105 104 100 105 107   Knee extension -15 LAQ +2 -10 LAQ -9 LAQ -8 LAQ -7 LAQ -4 LAQ 0 LAQ 0 LAQ 0 LAQ   Passive ROM Right eval R 09/27/22 R 10/02/22 R 10/12/22  Knee flexion 82 85    Knee extension -5 0 -1 0   LOWER EXTREMITY MMT:  MMT Right eval Left eval R 10/19/22 L 10/19/22 R 11/02/22 L 11/02/22  Hip flexion 4+ 5 4+ 5 4+ 5  Hip extension 4 4 4 4 4 4   Hip abduction 4- 4 4 4  4- 4  Hip adduction 4 4+ 4+ 4+ 4+ 4+  Hip internal rotation 4 4+ 4+ 4+ 4+ 5  Hip external rotation 4 4 4 4  4- 4  Knee flexion 4 5 4+ 5 4+ 5  Knee extension 4  5 4+ 5 4+ 5  Ankle dorsiflexion 4 4 4 4 4  4+  Ankle plantarflexion   4 (10 SLS HR) 4 (12 SLS HR)    Ankle inversion        Ankle eversion         (Blank rows = not tested)  FUNCTIONAL TESTS:  5 times sit to stand: 29.60 sec with weight shift to L LE Timed up and go (TUG): 23.79 sec with RW  GAIT: Distance walked: Clinic distances Assistive device utilized: Environmental consultant - 2  wheeled Level of assistance: Modified independence Gait pattern: step through pattern and decreased hip/knee flexion- Right Comments: Ambulates with stiff R knee but good heel strike on weight acceptance   TODAY'S TREATMENT:   11/08/22 THERAPEUTIC EXERCISE: to improve flexibility, strength and mobility.  Demonstration, verbal and tactile cues throughout for technique. Rec Bike L2x44min Gait w/o AD  360 ft -  Step ups 8' RLE 2 x 10 cues to keep pelvis level; also used mirror for feedback Hip hikes x 15 R LE Lateral step ups 8' RLE 2 x 10  Fwd step downs 2' 1x10 then heel taps fwd (reluctant to bend knee) and lateral (easier) x 10 ea Standing R lunge stretch x 10   11/02/22 - Recert THERAPEUTIC EXERCISE: to improve flexibility, strength and mobility.  Demonstration, verbal and tactile cues throughout for technique.  Rec Bike - L2 x 6 min  THERAPEUTIC ACTIVITIES: LEFS: 44 / 80 = 55.0 % 5xSTS = 14.91 sec w/o UE assist & even weight shift TUG = 9.78 sec w/o AD ROM and MMT assessment  Goal assessment  GAIT TRAINING: To normalize gait pattern and prepare to wean from AD. 300 ft w/o AD - mild L Trendelenburg hip drop due to glute medius  weakness Stairs: Level of Assistance: Modified independence Stair Negotiation Technique: Alternating Pattern  Forwards with Single Rail on Left Number of Stairs: 14  Height of Stairs: 7"  Comments: increased effort advancing L LE to next step during R stance phase on ascent due to continued quad and glute medius weakness; decreased R eccentric quad control on descent leading to compensatory hip drop  MANUAL THERAPY: To promote normalized muscle tension, improved flexibility, improved joint mobility, and increased ROM. Hooklying tibiofemoral AP mobs to increase L knee flexion Inferior patellar glide to increase L knee flexion Gravity assisted stretch and contract/relax into L knee flexion with L LE suspended over PT's arm   10/31/22 THERAPEUTIC  EXERCISE: to improve flexibility, strength and mobility.  Demonstration, verbal and tactile cues throughout for technique. Rec Bike L1x37min Gait w/o AD 270 ft - increased antalgic gait at first but decreased as we continued walking  Therapeutic Activity: to improve functional performance. Step ups 8' RLE x 10  Lateral step ups 8' RLE x 10  Stairs: 14 stairs ascending/descending- able to do reciprocal pattern but pulls herself going up on RLE, very hesitant with eccentric control on RLE going down but able to complete step down Fwd step downs 2' 2x10 RLE Standing R lunge stretch x 10  MODALITIES: Game Ready vasopneumatic compression post session to R knee x 10 min, medium compression, 34 to reduce post-exercise pain and swelling/edema   10/26/22 THERAPEUTIC EXERCISE: to improve flexibility, strength and mobility.  Demonstration, verbal and tactile cues throughout for technique. Rec Bike L1x42min Gait w/o AD 180 ft - increased antalgic gait at first but decreased as we continued walking Seated R hamstring + gastroc stretch with strap x 30 sec R single leg deadlift 4lb x 10 touch mat table Knee flexion 15# BLE 2x10  Knee mobilizations and PROM  10/24/22 THERAPEUTIC EXERCISE: to improve flexibility, strength and mobility.  Demonstration, verbal and tactile cues throughout for technique. Sit to stand with cues to keep feet even x 10  Single leg deadlift RLE reach to floor x 15 Step ups fwd 2 x 10 RLE; lateral 2 x 10 RLE 6' step Seated R hamstring curl GTB x 10 Seated R LAQ 3# x 10  Seated marches 3# x 10    PATIENT EDUCATION:  Education details: progress with PT, ongoing PT POC, and continue with current HEP  Person educated: Patient Education method: Medical illustrator Education comprehension: verbalized understanding and needs further education  HOME EXERCISE PROGRAM: Access Code: [email or web address] URL: [email or web address] Date: 11/08/2022 Prepared by:  Raynelle Fanning  Exercises - Supine Heel Slide with Strap  - 2 x daily - 7 x weekly - 2 sets - 10 reps - 3 sec hold - Heel Toe Raises with Counter Support  - 2 x daily - 7 x weekly - 2 sets - 10 reps - 3 sec hold - Standing Knee Flexion  - 2 x daily - 7 x weekly - 2 sets - 10 reps - 3 sec hold - Backward Weight Shift and Opposite Arm Raise with Walker  - 1 x daily - 7 x weekly - 2 sets - 10 reps - [place]  - 1 x daily - 7 x weekly - 2 sets - 10 reps - Sitting Knee Extension with Resistance  - 1 x daily - 7 x weekly - 2 sets - 10 reps - Seated Hamstring Curl with Anchored Resistance  - 1 x daily - 7 x weekly - 2 sets - 10 reps -  Long Sitting 4 Way Patellar Glide  - 2-3 x daily - 7 x weekly - 2 sets - 10 reps - Standing Hip Flexion with Anchored Resistance and Chair Support  - 1 x daily - 3-4 x weekly - 2 sets - 10 reps - 3 sec hold - Standing Hip Adduction with Anchored Resistance  - 1 x daily - 3-4 x weekly - 2 sets - 10 reps - 3 sec hold - Standing Hip Extension with Anchored Resistance  - 1 x daily - 3-4 x weekly - 2 sets - 10 reps - 3 sec hold - Standing Hip Abduction with Anchored Resistance  - 1 x daily - 3-4 x weekly - 2 sets - 10 reps - 3 sec hold - Side Stepping with Resistance at Ankles  - 1 x daily - 3 x weekly - 2 sets - 10 reps - Band Walks  - 1 x daily - 3 x weekly - 2 sets - 10 reps - Standing Knee Flexion Stretch on Step  - 2 x daily - 7 x weekly - 2 sets - 10 reps - 5 sec hold - Hip Hiking on Step  - 1 x daily - 3-4 x weekly - 1-2 sets - 10 reps - Step Up (Mirrored)  - 1 x daily - 3-4 x weekly - 1 sets - 10 reps  Patient Education - Rubbing with Different Textures  Patient Education - Rubbing with Different Textures   ASSESSMENT:  CLINICAL IMPRESSION: Patient is reluctant to bend her knee with step downs so we worked on getting her confidence back with this. She then was able to do heel taps laterally without difficulty. She continues to drop her L hip with step ups R but was  able to correct this after verbal cues and mirror feedback. She may need more review of hip hikes as she tended to bend her R knee. Overall she did very well with engaging R glute med and L quads with TE today.  OBJECTIVE IMPAIRMENTS: Abnormal gait, decreased activity tolerance, decreased balance, decreased endurance, decreased knowledge of condition, decreased knowledge of use of DME, decreased mobility, difficulty walking, decreased ROM, decreased strength, decreased safety awareness, increased edema, increased fascial restrictions, impaired perceived functional ability, increased muscle spasms, impaired flexibility, and pain.   ACTIVITY LIMITATIONS: sitting, standing, squatting, sleeping, stairs, transfers, bed mobility, bathing, toileting, dressing, locomotion level, and caring for others  PARTICIPATION LIMITATIONS: meal prep, cleaning, laundry, driving, shopping, community activity, and yard work  PERSONAL FACTORS: Fitness, Past/current experiences, Time since onset of injury/illness/exacerbation, and 3+ comorbidities: OA, anxiety, HTN, Raynaud's phenomenon, GERD, B CTS, DVT s/p COVID in 2021, syncope  are also affecting patient's functional outcome.   REHAB POTENTIAL: Excellent  CLINICAL DECISION MAKING: Stable/uncomplicated  EVALUATION COMPLEXITY: Low   GOALS: Goals reviewed with patient? Yes  SHORT TERM GOALS: Target date: 10/12/2022  Patient will be independent with initial HEP. Baseline: pt performing hospital issued HEP Goal status: MET  10/02/22  2.  Patient will demonstrate improved R knee AROM to >/= 5-90 deg to allow for normal gait pattern. Baseline: R knee AROM -15-77, PROM -5-82 Goal status: MET  10/02/22 - R knee AROM -1-100, lacking 8 extension in LAQ  LONG TERM GOALS: Target date: 11/02/2022; extended to 11/30/2022  Patient will be independent with advanced/ongoing HEP to improve outcomes and carryover.  Baseline:  Goal status: PARTIALLY MET  10/19/22 - Met for  current HEP  2.  Patient will report at least 75% improvement in  R knee pain to improve QOL. Baseline: 5-6/10 Goal status: MET  11/02/22 - 90-95% improvement in pain   3.  Patient will demonstrate improved R knee AROM to >/= -2-120 deg to allow for normal gait and stair mechanics. Baseline: R knee AROM -15-77, PROM -5-82 Goal status: IN PROGRESS  11/02/22 - R knee AROM 0-107, with full extension to 0 in LAQ  4.  Patient will demonstrate improved B LE strength to >/= 4+/5 for improved stability and ease of mobility. Baseline: refer to above LE MMT table Goal status: IN PROGRESS 11/02/22 - strength improving but R LE remains weaker than L (refer to above LE MMT table)  5.  Patient will be able to ambulate 600' with or w/o LRAD and normal gait pattern without increased pain to access community.  Baseline: antalgic gait with RW Goal status: IN PROGRESS  11/02/22 - 300' with mild L Trendelenburg gait pattern noted without AD  6. Patient will be able to ascend/descend stairs with 1 HR and reciprocal step pattern safely to access home and community.  Baseline: NT Goal status: IN PROGRESS  11/02/22 - increased effort advancing L LE to next step during R stance phase on ascent due to continued quad and glute medius weakness; decreased R eccentric quad control on descent leading to compensatory hip drop  7.  Patient will report >/= 30/80 on LEFS to demonstrate improved functional ability. Baseline: 13 / 80 = 16.3 % Goal status: MET  11/02/22 - 44 / 80 = 55.0 %  8.  Patient will improve 5x STS time to </= 15 seconds with even weight shift to demonstrate improved functional strength and transfer efficiency . Baseline: 29.60 sec with weight shift to L LE Goal status: MET  11/02/22 - 14.91 sec  9.  Patient will demonstrate decreased TUG time to </= 13.5 sec with or w/o LRAD to decrease risk for falls with transitional mobility Baseline: 23.79 sec with RW Goal status: MET  11/02/22 - 9.78  sec   PLAN:  PT FREQUENCY: 2x/week   PT DURATION: 4 weeks  PLANNED INTERVENTIONS: 97164- PT Re-evaluation, 97110-Therapeutic exercises, 97530- Therapeutic activity, 97112- Neuromuscular re-education, 97535- Self Care, 40981- Manual therapy, 97116- Gait training, 97014- Electrical stimulation (unattended), 97016- Vasopneumatic device, 97035- Ultrasound, 19147- Ionotophoresis 4mg /ml Dexamethasone, Patient/Family education, Balance training, Stair training, Taping, Dry Needling, Joint mobilization, DME instructions, Cryotherapy, Moist heat, Therapeutic exercises, Therapeutic activity, Neuromuscular re-education, Gait training, and Self Care  PLAN FOR NEXT SESSION: review & update HEP PRN - add quad stretching and eccentric quad & glute med strengthening; progress R knee ROM (emphasis on flexion ROM) and B LE strengthening - progress standing activities/functional strengthening; balance training; gait training to normalize gait pattern w/o AD; MT for increased R knee ROM and edema management; modalities including vaso for R knee pain and R LE edema management; weekly R knee AROM assessment  Solon Palm, PT  11/08/2022, 4:55 PM    Date of referral: 08/10/22 Referring provider: Joen Laura, MD Referring diagnosis? W29.562 (ICD-10-CM) - Status post right partial knee replacement  Treatment diagnosis? (if different than referring diagnosis)  Stiffness of right knee, not elsewhere classified  Acute pain of right knee  Other abnormalities of gait and mobility  Muscle weakness (generalized)  Localized edema  What was this (referring dx) caused by? Surgery (Type: R unicondylar knee replacement) and Arthritis  Nature of Condition: Initial Onset (within last 3 months)   Laterality: Rt  Current Functional Measure Score: LEFS 44 / 80 =  55.0 %  Objective measurements identify impairments when they are compared to normal values, the uninvolved extremity, and prior level of  function.  [x]  Yes  []  No  Objective assessment of functional ability: Moderate functional limitations   Briefly describe symptoms: Ongoing deficits include intermittent R knee pain, increased edema in R LE, limited and painful endrange R knee flexion ROM, R>L LE weakness and impaired ADLs, gait and mobility, especially stair negotiation.  How did symptoms start: OA leading to R unicondylar knee replacement on 09/18/22  Average pain intensity:  Last 24 hours: 0-1/10  Past week: 1/10  How often does the pt experience symptoms? Intermittently  How much have the symptoms interfered with usual daily activities? Moderately  How has condition changed since care began at this facility? Better  In general, how is the patients overall health? Very Good

## 2022-11-08 ENCOUNTER — Encounter: Payer: Self-pay | Admitting: Physical Therapy

## 2022-11-08 ENCOUNTER — Ambulatory Visit: Payer: Medicare Other | Admitting: Physical Therapy

## 2022-11-08 DIAGNOSIS — M6281 Muscle weakness (generalized): Secondary | ICD-10-CM | POA: Diagnosis not present

## 2022-11-08 DIAGNOSIS — M25661 Stiffness of right knee, not elsewhere classified: Secondary | ICD-10-CM

## 2022-11-08 DIAGNOSIS — M25561 Pain in right knee: Secondary | ICD-10-CM | POA: Diagnosis not present

## 2022-11-08 DIAGNOSIS — R2689 Other abnormalities of gait and mobility: Secondary | ICD-10-CM | POA: Diagnosis not present

## 2022-11-08 DIAGNOSIS — R600 Localized edema: Secondary | ICD-10-CM | POA: Diagnosis not present

## 2022-11-09 ENCOUNTER — Encounter: Payer: Medicare Other | Admitting: Physical Therapy

## 2022-11-14 ENCOUNTER — Encounter: Payer: Self-pay | Admitting: Physical Therapy

## 2022-11-14 ENCOUNTER — Ambulatory Visit: Payer: Medicare Other | Attending: Orthopedic Surgery | Admitting: Physical Therapy

## 2022-11-14 DIAGNOSIS — M6281 Muscle weakness (generalized): Secondary | ICD-10-CM | POA: Diagnosis not present

## 2022-11-14 DIAGNOSIS — R600 Localized edema: Secondary | ICD-10-CM | POA: Diagnosis not present

## 2022-11-14 DIAGNOSIS — M25661 Stiffness of right knee, not elsewhere classified: Secondary | ICD-10-CM | POA: Insufficient documentation

## 2022-11-14 DIAGNOSIS — M25561 Pain in right knee: Secondary | ICD-10-CM | POA: Diagnosis not present

## 2022-11-14 DIAGNOSIS — R2689 Other abnormalities of gait and mobility: Secondary | ICD-10-CM | POA: Diagnosis not present

## 2022-11-14 NOTE — Therapy (Signed)
OUTPATIENT PHYSICAL THERAPY TREATMENT NOTE  Patient Name: Megan Crane MRN: [id number] DOB:[birth year], 72 y.o., female Today's Date: 11/14/2022   END OF SESSION:  PT End of Session - 11/14/22 1033     Visit Number 17    Date for PT Re-Evaluation 11/30/22    Authorization Type [employer] Medicare    Authorization Time Period 09/21/22-11/02/22; extended to 11/30/22 at recert    Progress Note Due on Visit 25    PT Start Time 1018    PT Stop Time 1057    PT Time Calculation (min) 39 min    Activity Tolerance Patient tolerated treatment well    Behavior During Therapy [facility] for tasks assessed/performed                           Past Medical History:  Diagnosis Date   Anxiety disorder    hx vasovagal responses   Arthritis    Cancer (HCC)    Deep vein thrombosis (DVT) (HCC)    hx of in left left after covid in 2021   Headache(784.0)    History of kidney stones    Hydronephrosis, right    Hyperlipidemia    Hypertension    Nephrolithiasis    Pneumonia    Positive nasal culture for methicillin resistant Staphylococcus aureus    Right ureteral stone    Thrombophlebitis of leg, left, superficial 11/17/2020   Urinary incontinence    sees Dr. McDiarmid    Vasovagal syncope 08/02/2022   Past Surgical History:  Procedure Laterality Date   BLADDER REPAIR     COLONOSCOPY  12/28/2015   per Dr. Adela Lank, serrated polyps and diverticula, repeat in 3 yrs    CYSTOSCOPY  06/09/2011   Procedure: CYSTOSCOPY;  Surgeon: Martina Sinner, MD;  Location: [facility];  Service: Urology;  Laterality: N/A;   CYSTOSCOPY W/ URETERAL STENT PLACEMENT  02/03/2014   Procedure: CYSTOSCOPY WITH  RIGHT RETROGRADE PYELOGRAM/ RIGHT URETERAL STENT PLACEMENT;  Surgeon: Anner Crete, MD;  Location: [facility];  Service: Urology;;   CYSTOSCOPY WITH BIOPSY  02/03/2014   Procedure: CYSTOSCOPY WITH BIOPSY;  Surgeon: Anner Crete, MD;  Location: [facility];  Service:  Urology;;   PARTIAL KNEE ARTHROPLASTY Right 09/18/2022   Procedure: UNICOMPARTMENTAL KNEE;  Surgeon: Joen Laura, MD;  Location: [facility];  Service: Orthopedics;  Laterality: Right;   PILONIDAL CYST EXCISION  1974   RECTOCELE REPAIR  06/09/2011   Procedure: POSTERIOR REPAIR (RECTOCELE);  Surgeon: Martina Sinner, MD;  Location: [facility];  Service: Urology;  Laterality: N/A;  Xenform graft 6x10   RIGHT URETEROSCOPIC STONE EXTRACTION / STENT PLACEMENT  03/13/2000   URETEROSCOPY Right 02/03/2014   Procedure: URETEROSCOPY WITH MANAGEMENT OF URETERAL STRICTURE;  Surgeon: Anner Crete, MD;  Location[facility];  Service: Urology;  Laterality: Right;   VAGINAL HYSTERECTOMY  06/09/2011   Procedure: HYSTERECTOMY VAGINAL;  Surgeon: Mickel Baas, MD;  Location: [facility];  Service: Gynecology;  Laterality: N/A;   VAGINAL PROLAPSE REPAIR  06/09/2011   Procedure: VAGINAL VAULT SUSPENSION;  Surgeon: Martina Sinner, MD;  Location: [facility];  Service: Urology;  Laterality: N/A;   VEIN LIGATION AND STRIPPING     LEFT LEG   Patient Active Problem List   Diagnosis Date Noted   S/P right unicompartmental knee replacement 09/18/2022   IC (interstitial cystitis) 09/04/2022   Pelvic pressure in female 08/22/2022   Visit for  pre-operative examination 08/07/2022   Vasovagal syncope 08/02/2022   Nasal dryness 06/30/2022   Precancerous skin lesion 06/30/2022   Bilateral carpal tunnel syndrome 03/02/2022   Swelling of both hands 03/02/2022   Atypical chest pain 03/02/2022   OAB (overactive bladder) 11/23/2021   Corn of foot 11/23/2021   Atrophic vaginitis 11/23/2021   COVID-19 virus infection 11/17/2020   Raynaud's phenomenon without gangrene 11/17/2020   Primary osteoarthritis involving multiple joints 11/24/2019   GERD (gastroesophageal reflux disease) 12/26/2016   Edema 05/29/2014   Nasal colonization with methicillin-resistant Staphylococcus aureus 01/22/2014   Varicose veins  of bilateral lower extremities with other complications 12/11/2012   Allergic rhinitis 11/29/2009   MOTION SICKNESS 11/29/2009   Hyperlipidemia 08/31/2008   Anxiety state 08/31/2008   Primary hypertension 08/31/2008   PILONIDAL CYST 08/31/2008   HEADACHE 08/31/2008   URINARY INCONTINENCE 08/31/2008   NEPHROLITHIASIS, HX OF 08/31/2008   POSTNASAL DRIP SYNDROME 05/28/2006    PCP: Nelwyn Salisbury, MD   REFERRING PROVIDER: Joen Laura, MD   REFERRING DIAG: [phone] (ICD-10-CM) - Status post right partial knee replacement   THERAPY DIAG:  Stiffness of right knee, not elsewhere classified  Other abnormalities of gait and mobility  Muscle weakness (generalized)  Localized edema  Acute pain of right knee  RATIONALE FOR EVALUATION AND TREATMENT: Rehabilitation  ONSET DATE: 09/18/2022 - R unicompartmental knee replacement  NEXT MD VISIT: 12/12/2022    SUBJECTIVE:                                                                                                                                                                                                         SUBJECTIVE STATEMENT:  [facility] [place] [facility] about 10 minutes. Knee is hurting today, leg near the ankle is hurting today too. Trying to do different exercises during the day. Did stairs a little bit with my friend and with [facility] a bit as well. Walking outside in our yard but noticing I'm still favoring that leg.   PAIN: Are you having pain? Yes: NPRS scale: 1/10 Pain location: front of right knee and R ankle  Pain description: burning, aching, sore  Aggravating factors: too much activity Relieving factors: exercise, warm showers, ice   PERTINENT HISTORY:  OA, anxiety, HTN, Raynaud's phenomenon, GERD, B CTS, DVT s/p COVID in 2021, syncope  PRECAUTIONS: None  RED FLAGS: None  WEIGHT BEARING RESTRICTIONS: No  FALLS:  Has patient fallen in last 6 months? 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 Go back to work part-time."   OBJECTIVE: (objective measures completed at initial evaluation unless otherwise dated)  DIAGNOSTIC FINDINGS:  09/18/22 - DG R knee:  IMPRESSION: Medial compartment hemiarthroplasty without immediate postoperative complication.  PATIENT SURVEYS:  LEFS 13 / 80 = 16.3 %  11/02/22: 44 / 80 = 55.0 %  COGNITION: Overall cognitive status: Within functional limits for tasks assessed    SENSATION: WFL  EDEMA:  Circumferential: 44 cm at joint line, 53 cm (5 cm above joint line)  MUSCLE LENGTH: Hamstrings: Mild/mod tight R>L ITB: Mild/mod tight R>L Piriformis: Mild tight R>L Hip flexors: Mild tight R>L Quads: Mod tight R>L Heelcord: NT  POSTURE:  No Significant postural limitations  PALPATION: R knee swollen and slightly warm to touch with mild erythema present.  Decreased R patellar mobility in all directions.  LOWER EXTREMITY ROM:  Active ROM Right eval Left eval R 09/27/22 R 09/29/22 R 10/02/22 R 10/06/22 R 10/12/22 R 10/19/22 R 10/26/22 R 11/02/22 R 11/14/22  Knee flexion 77 127 85 91 100 105 104 100 105 107  107*  Knee extension -15 LAQ +2 -10 LAQ -9 LAQ -8 LAQ -7 LAQ -4 LAQ 0 LAQ 0 LAQ 0 LAQ 0*   Passive ROM Right eval R 09/27/22 R 10/02/22 R 10/12/22  Knee flexion 82 85    Knee extension -5 0 -1 0   LOWER EXTREMITY MMT:  MMT Right eval Left eval R 10/19/22 L 10/19/22 R 11/02/22 L 11/02/22  Hip flexion 4+ 5 4+ 5 4+ 5  Hip extension 4 4 4 4 4 4   Hip abduction 4- 4 4 4  4- 4  Hip adduction 4 4+ 4+ 4+ 4+ 4+  Hip internal  rotation 4 4+ 4+ 4+ 4+ 5  Hip external rotation 4 4 4 4  4- 4  Knee flexion 4 5 4+ 5 4+ 5  Knee extension 4  5 4+ 5 4+ 5  Ankle dorsiflexion 4 4 4 4 4  4+  Ankle plantarflexion   4 (10 SLS HR) 4 (12 SLS HR)    Ankle inversion        Ankle eversion         (Blank rows = not tested)  FUNCTIONAL TESTS:  5 times sit to stand: 29.60 sec with weight shift to L LE Timed up and go (TUG): 23.79 sec with RW  GAIT: Distance walked: Clinic distances Assistive device utilized: Environmental consultant - 2 wheeled Level of assistance: Modified independence Gait pattern: step through pattern and decreased hip/knee flexion- Right Comments: Ambulates with stiff R knee but good heel strike on weight acceptance   TODAY'S TREATMENT:    11/14/22  TherEx  Reclining bike L3 x8 minutes for w/u and ROM full rotations  ROM check  Forward step ups x15 8 inch box cues to let knee bend when stepping down  Knee flexion stretch on 8 inch box 15x5 second holds HS curls with focus on thigh perpendicular to ground 15x3 second holds  Lateral step ups x15 8 inch box cues to let knee bend when stepping down and lateral wt shift over LE prior to step up  Discussed modifying STS  form at home with R LE staggered back to help reduce favoring R LE, discussed use of bike at home to help with ROM/knee mobility     11/08/22 THERAPEUTIC EXERCISE: to improve flexibility, strength and mobility.  Demonstration, verbal and tactile cues throughout for technique. Rec Bike L2x57min Gait w/o AD  360 ft -  Step ups 8' RLE 2 x 10 cues to keep pelvis level; also used mirror for feedback Hip hikes x 15 R LE Lateral step ups 8' RLE 2 x 10  Fwd step downs 2' 1x10 then heel taps fwd (reluctant to bend knee) and lateral (easier) x 10 ea Standing R lunge stretch x 10   11/02/22 - Recert THERAPEUTIC EXERCISE: to improve flexibility, strength and mobility.  Demonstration, verbal and tactile cues throughout for technique.  Rec Bike - L2 x 6  min  THERAPEUTIC ACTIVITIES: LEFS: 44 / 80 = 55.0 % 5xSTS = 14.91 sec w/o UE assist & even weight shift TUG = 9.78 sec w/o AD ROM and MMT assessment  Goal assessment  GAIT TRAINING: To normalize gait pattern and prepare to wean from AD. 300 ft w/o AD - mild L Trendelenburg hip drop due to glute medius weakness Stairs: Level of Assistance: Modified independence Stair Negotiation Technique: Alternating Pattern  Forwards with Single Rail on Left Number of Stairs: 14  Height of Stairs: 7"  Comments: increased effort advancing L LE to next step during R stance phase on ascent due to continued quad and glute medius weakness; decreased R eccentric quad control on descent leading to compensatory hip drop  MANUAL THERAPY: To promote normalized muscle tension, improved flexibility, improved joint mobility, and increased ROM. Hooklying tibiofemoral AP mobs to increase L knee flexion Inferior patellar glide to increase L knee flexion Gravity assisted stretch and contract/relax into L knee flexion with L LE suspended over PT's arm   10/31/22 THERAPEUTIC EXERCISE: to improve flexibility, strength and mobility.  Demonstration, verbal and tactile cues throughout for technique. Rec Bike L1x29min Gait w/o AD 270 ft - increased antalgic gait at first but decreased as we continued walking  Therapeutic Activity: to improve functional performance. Step ups 8' RLE x 10  Lateral step ups 8' RLE x 10  Stairs: 14 stairs ascending/descending- able to do reciprocal pattern but pulls herself going up on RLE, very hesitant with eccentric control on RLE going down but able to complete step down Fwd step downs 2' 2x10 RLE Standing R lunge stretch x 10  MODALITIES: Game Ready vasopneumatic compression post session to R knee x 10 min, medium compression, 34 to reduce post-exercise pain and swelling/edema   10/26/22 THERAPEUTIC EXERCISE: to improve flexibility, strength and mobility.  Demonstration, verbal  and tactile cues throughout for technique. Rec Bike L1x72min Gait w/o AD 180 ft - increased antalgic gait at first but decreased as we continued walking Seated R hamstring + gastroc stretch with strap x 30 sec R single leg deadlift 4lb x 10 touch mat table Knee flexion 15# BLE 2x10  Knee mobilizations and PROM  10/24/22 THERAPEUTIC EXERCISE: to improve flexibility, strength and mobility.  Demonstration, verbal and tactile cues throughout for technique. Sit to stand with cues to keep feet even x 10  Single leg deadlift RLE reach to floor x 15 Step ups fwd 2 x 10 RLE; lateral 2 x 10 RLE 6' step Seated R hamstring curl GTB x 10 Seated R LAQ 3# x 10  Seated marches 3# x 10    PATIENT EDUCATION:  Education  details: progress with PT, ongoing PT POC, and continue with current HEP  Person educated: Patient Education method: Explanation and Demonstration Education comprehension: verbalized understanding and needs further education  HOME EXERCISE PROGRAM: Access Code: [email or web address] URL: [email or web address] Date: 11/08/2022 Prepared by: Raynelle Fanning  Exercises - Supine Heel Slide with Strap  - 2 x daily - 7 x weekly - 2 sets - 10 reps - 3 sec hold - Heel Toe Raises with Counter Support  - 2 x daily - 7 x weekly - 2 sets - 10 reps - 3 sec hold - Standing Knee Flexion  - 2 x daily - 7 x weekly - 2 sets - 10 reps - 3 sec hold - Backward Weight Shift and Opposite Arm Raise with Walker  - 1 x daily - 7 x weekly - 2 sets - 10 reps - [place]  - 1 x daily - 7 x weekly - 2 sets - 10 reps - Sitting Knee Extension with Resistance  - 1 x daily - 7 x weekly - 2 sets - 10 reps - Seated Hamstring Curl with Anchored Resistance  - 1 x daily - 7 x weekly - 2 sets - 10 reps - Long Sitting 4 Way Patellar Glide  - 2-3 x daily - 7 x weekly - 2 sets - 10 reps - Standing Hip Flexion with Anchored Resistance and Chair Support  - 1 x daily - 3-4 x weekly - 2 sets - 10 reps - 3 sec hold - Standing Hip  Adduction with Anchored Resistance  - 1 x daily - 3-4 x weekly - 2 sets - 10 reps - 3 sec hold - Standing Hip Extension with Anchored Resistance  - 1 x daily - 3-4 x weekly - 2 sets - 10 reps - 3 sec hold - Standing Hip Abduction with Anchored Resistance  - 1 x daily - 3-4 x weekly - 2 sets - 10 reps - 3 sec hold - Side Stepping with Resistance at Ankles  - 1 x daily - 3 x weekly - 2 sets - 10 reps - Band Walks  - 1 x daily - 3 x weekly - 2 sets - 10 reps - Standing Knee Flexion Stretch on Step  - 2 x daily - 7 x weekly - 2 sets - 10 reps - 5 sec hold - Hip Hiking on Step  - 1 x daily - 3-4 x weekly - 1-2 sets - 10 reps - Step Up (Mirrored)  - 1 x daily - 3-4 x weekly - 1 sets - 10 reps  Patient Education - Rubbing with Different Textures  Patient Education - Rubbing with Different Textures   ASSESSMENT:  CLINICAL IMPRESSION:  Pt arrives today doing well, having a little more pain than usual but she was really active yesterday, still very motivated to improve. Very pleasant but very talkative this morning, session a bit limited. Checked ROM, still looking good and we continued focus on functional strengthening primarily today. Will continue to progress as appropriate.   OBJECTIVE IMPAIRMENTS: Abnormal gait, decreased activity tolerance, decreased balance, decreased endurance, decreased knowledge of condition, decreased knowledge of use of DME, decreased mobility, difficulty walking, decreased ROM, decreased strength, decreased safety awareness, increased edema, increased fascial restrictions, impaired perceived functional ability, increased muscle spasms, impaired flexibility, and pain.   ACTIVITY LIMITATIONS: sitting, standing, squatting, sleeping, stairs, transfers, bed mobility, bathing, toileting, dressing, locomotion level, and caring for others  PARTICIPATION LIMITATIONS: meal prep, cleaning, laundry, driving, shopping, community activity, and  yard work  PERSONAL FACTORS: Fitness,  Past/current experiences, Time since onset of injury/illness/exacerbation, and 3+ comorbidities: OA, anxiety, HTN, Raynaud's phenomenon, GERD, B CTS, DVT s/p COVID in 2021, syncope  are also affecting patient's functional outcome.   REHAB POTENTIAL: Excellent  CLINICAL DECISION MAKING: Stable/uncomplicated  EVALUATION COMPLEXITY: Low   GOALS: Goals reviewed with patient? Yes  SHORT TERM GOALS: Target date: 10/12/2022  Patient will be independent with initial HEP. Baseline: pt performing hospital issued HEP Goal status: MET  10/02/22  2.  Patient will demonstrate improved R knee AROM to >/= 5-90 deg to allow for normal gait pattern. Baseline: R knee AROM -15-77, PROM -5-82 Goal status: MET  10/02/22 - R knee AROM -1-100, lacking 8 extension in LAQ  LONG TERM GOALS: Target date: 11/02/2022; extended to 11/30/2022  Patient will be independent with advanced/ongoing HEP to improve outcomes and carryover.  Baseline:  Goal status: PARTIALLY MET  10/19/22 - Met for current HEP  2.  Patient will report at least 75% improvement in R knee pain to improve QOL. Baseline: 5-6/10 Goal status: MET  11/02/22 - 90-95% improvement in pain   3.  Patient will demonstrate improved R knee AROM to >/= -2-120 deg to allow for normal gait and stair mechanics. Baseline: R knee AROM -15-77, PROM -5-82 Goal status: IN PROGRESS  11/02/22 - R knee AROM 0-107, with full extension to 0 in LAQ  4.  Patient will demonstrate improved B LE strength to >/= 4+/5 for improved stability and ease of mobility. Baseline: refer to above LE MMT table Goal status: IN PROGRESS 11/02/22 - strength improving but R LE remains weaker than L (refer to above LE MMT table)  5.  Patient will be able to ambulate 600' with or w/o LRAD and normal gait pattern without increased pain to access community.  Baseline: antalgic gait with RW Goal status: IN PROGRESS  11/02/22 - 300' with mild L Trendelenburg gait pattern noted without  AD  6. Patient will be able to ascend/descend stairs with 1 HR and reciprocal step pattern safely to access home and community.  Baseline: NT Goal status: IN PROGRESS  11/02/22 - increased effort advancing L LE to next step during R stance phase on ascent due to continued quad and glute medius weakness; decreased R eccentric quad control on descent leading to compensatory hip drop  7.  Patient will report >/= 30/80 on LEFS to demonstrate improved functional ability. Baseline: 13 / 80 = 16.3 % Goal status: MET  11/02/22 - 44 / 80 = 55.0 %  8.  Patient will improve 5x STS time to </= 15 seconds with even weight shift to demonstrate improved functional strength and transfer efficiency . Baseline: 29.60 sec with weight shift to L LE Goal status: MET  11/02/22 - 14.91 sec  9.  Patient will demonstrate decreased TUG time to </= 13.5 sec with or w/o LRAD to decrease risk for falls with transitional mobility Baseline: 23.79 sec with RW Goal status: MET  11/02/22 - 9.78 sec   PLAN:  PT FREQUENCY: 2x/week   PT DURATION: 4 weeks  PLANNED INTERVENTIONS: 97164- PT Re-evaluation, 97110-Therapeutic exercises, 97530- Therapeutic activity, 97112- Neuromuscular re-education, 97535- Self Care, 67893- Manual therapy, 97116- Gait training, 97014- Electrical stimulation (unattended), 97016- Vasopneumatic device, 97035- Ultrasound, 81017- Ionotophoresis 4mg /ml Dexamethasone, Patient/Family education, Balance training, Stair training, Taping, Dry Needling, Joint mobilization, DME instructions, Cryotherapy, Moist heat, Therapeutic exercises, Therapeutic activity, Neuromuscular re-education, Gait training, and Self Care  PLAN FOR NEXT SESSION:  review & update HEP PRN - add quad stretching and eccentric quad & glute med strengthening; progress R knee ROM (emphasis on flexion ROM) and B LE strengthening - progress standing activities/functional strengthening; balance training; gait training to normalize gait pattern  w/o AD; MT for increased R knee ROM and edema management;, balance. weekly R knee AROM assessment  Nedra Hai, PT, DPT 11/14/22 10:57 AM     Date of referral: 08/10/22 Referring provider: Joen Laura, MD Referring diagnosis? Z61.096 (ICD-10-CM) - Status post right partial knee replacement  Treatment diagnosis? (if different than referring diagnosis)  Stiffness of right knee, not elsewhere classified  Other abnormalities of gait and mobility  Muscle weakness (generalized)  Localized edema  Acute pain of right knee  What was this (referring dx) caused by? Surgery (Type: R unicondylar knee replacement) and Arthritis  Nature of Condition: Initial Onset (within last 3 months)   Laterality: Rt  Current Functional Measure Score: LEFS 44 / 80 = 55.0 %  Objective measurements identify impairments when they are compared to normal values, the uninvolved extremity, and prior level of function.  [x]  Yes  []  No  Objective assessment of functional ability: Moderate functional limitations   Briefly describe symptoms: Ongoing deficits include intermittent R knee pain, increased edema in R LE, limited and painful endrange R knee flexion ROM, R>L LE weakness and impaired ADLs, gait and mobility, especially stair negotiation.  How did symptoms start: OA leading to R unicondylar knee replacement on 09/18/22  Average pain intensity:  Last 24 hours: 0-1/10  Past week: 1/10  How often does the pt experience symptoms? Intermittently  How much have the symptoms interfered with usual daily activities? Moderately  How has condition changed since care began at this facility? Better  In general, how is the patients overall health? Very Good

## 2022-11-16 ENCOUNTER — Encounter: Payer: Self-pay | Admitting: Physical Therapy

## 2022-11-16 ENCOUNTER — Ambulatory Visit: Payer: Medicare Other | Admitting: Physical Therapy

## 2022-11-16 DIAGNOSIS — M6281 Muscle weakness (generalized): Secondary | ICD-10-CM

## 2022-11-16 DIAGNOSIS — M25661 Stiffness of right knee, not elsewhere classified: Secondary | ICD-10-CM | POA: Diagnosis not present

## 2022-11-16 DIAGNOSIS — M25561 Pain in right knee: Secondary | ICD-10-CM

## 2022-11-16 DIAGNOSIS — R2689 Other abnormalities of gait and mobility: Secondary | ICD-10-CM

## 2022-11-16 DIAGNOSIS — R600 Localized edema: Secondary | ICD-10-CM

## 2022-11-16 NOTE — Therapy (Signed)
OUTPATIENT PHYSICAL THERAPY TREATMENT NOTE  Patient Name: Megan Crane MRN: [id number] DOB:[birth year], 72 y.o., female Today's Date: 11/16/2022   END OF SESSION:  PT End of Session - 11/16/22 1028     Visit Number 18    Date for PT Re-Evaluation 11/30/22    Authorization Type [employer] Medicare    Authorization Time Period 09/21/22-11/02/22; extended to 11/30/22 at recert    Progress Note Due on Visit 25    PT Start Time 1017    PT Stop Time 1100    PT Time Calculation (min) 43 min    Activity Tolerance Patient tolerated treatment well    Behavior During Therapy [facility] for tasks assessed/performed                            Past Medical History:  Diagnosis Date   Anxiety disorder    hx vasovagal responses   Arthritis    Cancer (HCC)    Deep vein thrombosis (DVT) (HCC)    hx of in left left after covid in 2021   Headache(784.0)    History of kidney stones    Hydronephrosis, right    Hyperlipidemia    Hypertension    Nephrolithiasis    Pneumonia    Positive nasal culture for methicillin resistant Staphylococcus aureus    Right ureteral stone    Thrombophlebitis of leg, left, superficial 11/17/2020   Urinary incontinence    sees Dr. McDiarmid    Vasovagal syncope 08/02/2022   Past Surgical History:  Procedure Laterality Date   BLADDER REPAIR     COLONOSCOPY  12/28/2015   per Dr. Adela Lank, serrated polyps and diverticula, repeat in 3 yrs    CYSTOSCOPY  06/09/2011   Procedure: CYSTOSCOPY;  Surgeon: Martina Sinner, MD;  Location: [facility];  Service: Urology;  Laterality: N/A;   CYSTOSCOPY W/ URETERAL STENT PLACEMENT  02/03/2014   Procedure: CYSTOSCOPY WITH  RIGHT RETROGRADE PYELOGRAM/ RIGHT URETERAL STENT PLACEMENT;  Surgeon: Anner Crete, MD;  Location: [facility];  Service: Urology;;   CYSTOSCOPY WITH BIOPSY  02/03/2014   Procedure: CYSTOSCOPY WITH BIOPSY;  Surgeon: Anner Crete, MD;  Location: [facility];  Service:  Urology;;   PARTIAL KNEE ARTHROPLASTY Right 09/18/2022   Procedure: UNICOMPARTMENTAL KNEE;  Surgeon: Joen Laura, MD;  Location: [facility];  Service: Orthopedics;  Laterality: Right;   PILONIDAL CYST EXCISION  1974   RECTOCELE REPAIR  06/09/2011   Procedure: POSTERIOR REPAIR (RECTOCELE);  Surgeon: Martina Sinner, MD;  Location: [facility];  Service: Urology;  Laterality: N/A;  Xenform graft 6x10   RIGHT URETEROSCOPIC STONE EXTRACTION / STENT PLACEMENT  03/13/2000   URETEROSCOPY Right 02/03/2014   Procedure: URETEROSCOPY WITH MANAGEMENT OF URETERAL STRICTURE;  Surgeon: Anner Crete, MD;  Location: [facility];  Service: Urology;  Laterality: Right;   VAGINAL HYSTERECTOMY  06/09/2011   Procedure: HYSTERECTOMY VAGINAL;  Surgeon: Mickel Baas, MD;  Location: [facility];  Service: Gynecology;  Laterality: N/A;   VAGINAL PROLAPSE REPAIR  06/09/2011   Procedure: VAGINAL VAULT SUSPENSION;  Surgeon: Martina Sinner, MD;  Location: [facility];  Service: Urology;  Laterality: N/A;   VEIN LIGATION AND STRIPPING     LEFT LEG   Patient Active Problem List   Diagnosis Date Noted   S/P right unicompartmental knee replacement 09/18/2022   IC (interstitial cystitis) 09/04/2022   Pelvic pressure in female 08/22/2022   Visit  for pre-operative examination 08/07/2022   Vasovagal syncope 08/02/2022   Nasal dryness 06/30/2022   Precancerous skin lesion 06/30/2022   Bilateral carpal tunnel syndrome 03/02/2022   Swelling of both hands 03/02/2022   Atypical chest pain 03/02/2022   OAB (overactive bladder) 11/23/2021   Corn of foot 11/23/2021   Atrophic vaginitis 11/23/2021   COVID-19 virus infection 11/17/2020   Raynaud's phenomenon without gangrene 11/17/2020   Primary osteoarthritis involving multiple joints 11/24/2019   GERD (gastroesophageal reflux disease) 12/26/2016   Edema 05/29/2014   Nasal colonization with methicillin-resistant Staphylococcus aureus 01/22/2014   Varicose veins  of bilateral lower extremities with other complications 12/11/2012   Allergic rhinitis 11/29/2009   MOTION SICKNESS 11/29/2009   Hyperlipidemia 08/31/2008   Anxiety state 08/31/2008   Primary hypertension 08/31/2008   PILONIDAL CYST 08/31/2008   HEADACHE 08/31/2008   URINARY INCONTINENCE 08/31/2008   NEPHROLITHIASIS, HX OF 08/31/2008   POSTNASAL DRIP SYNDROME 05/28/2006    PCP: Nelwyn Salisbury, MD   REFERRING PROVIDER: Joen Laura, MD   REFERRING DIAG: [phone] (ICD-10-CM) - Status post right partial knee replacement   THERAPY DIAG:  Stiffness of right knee, not elsewhere classified  Other abnormalities of gait and mobility  Muscle weakness (generalized)  Localized edema  Acute pain of right knee  RATIONALE FOR EVALUATION AND TREATMENT: Rehabilitation  ONSET DATE: 09/18/2022 - R unicompartmental knee replacement  NEXT MD VISIT: 12/12/2022    SUBJECTIVE:                                                                                                                                                                                                         SUBJECTIVE STATEMENT:  Got on my stationary bike like we talked about, the angle is different than the bike.  Leg is still burning and tight a little bit but not as bad.   PAIN: Are you having pain? Yes: NPRS scale: 2/10 Pain location: front of right knee and R ankle  Pain description: burning, aching, sore  Aggravating factors: too much activity Relieving factors: exercise, warm showers, ice   PERTINENT HISTORY:  OA, anxiety, HTN, Raynaud's phenomenon, GERD, B CTS, DVT s/p COVID in 2021, syncope  PRECAUTIONS: None  RED FLAGS: None  WEIGHT BEARING RESTRICTIONS: No  FALLS:  Has patient fallen in last 6 months? No  LIVING ENVIRONMENT: Lives with: lives with their spouse Lives in: House/apartment Stairs: Yes: External: 1+2 steps; none Has following equipment at home: Single point cane, Walker - 2  wheeled, Shower bench, and bed side commode  OCCUPATION: Retired  PLOF: Independent and Leisure: reading, quilting, cross-stitch, yardwork, Silver Sneakers at [employer] ~2x/month  PATIENT GOALS: "Be able to go up/down steps in motor home so we can travel. Go back to work part-time."   OBJECTIVE: (objective measures completed at initial evaluation unless otherwise dated)  DIAGNOSTIC FINDINGS:  09/18/22 - DG R knee:  IMPRESSION: Medial compartment hemiarthroplasty without immediate postoperative complication.  PATIENT SURVEYS:  LEFS 13 / 80 = 16.3 %  11/02/22: 44 / 80 = 55.0 %  COGNITION: Overall cognitive status: Within functional limits for tasks assessed    SENSATION: WFL  EDEMA:  Circumferential: 44 cm at joint line, 53 cm (5 cm above joint line)  MUSCLE LENGTH: Hamstrings: Mild/mod tight R>L ITB: Mild/mod tight R>L Piriformis: Mild tight R>L Hip flexors: Mild tight R>L Quads: Mod tight R>L Heelcord: NT  POSTURE:  No Significant postural limitations  PALPATION: R knee swollen and slightly warm to touch with mild erythema present.  Decreased R patellar mobility in all directions.  LOWER EXTREMITY ROM:  Active ROM Right eval Left eval R 09/27/22 R 09/29/22 R 10/02/22 R 10/06/22 R 10/12/22 R 10/19/22 R 10/26/22 R 11/02/22 R 11/14/22  Knee flexion 77 127 85 91 100 105 104 100 105 107  107*  Knee extension -15 LAQ +2 -10 LAQ -9 LAQ -8 LAQ -7 LAQ -4 LAQ 0 LAQ 0 LAQ 0 LAQ 0*   Passive ROM Right eval R 09/27/22 R 10/02/22 R 10/12/22  Knee flexion 82 85    Knee extension -5 0 -1 0   LOWER EXTREMITY MMT:  MMT Right eval Left eval R 10/19/22 L 10/19/22 R 11/02/22 L 11/02/22  Hip flexion 4+ 5 4+ 5 4+ 5  Hip extension 4 4 4 4 4 4   Hip abduction 4- 4 4 4  4- 4  Hip adduction 4 4+ 4+ 4+ 4+ 4+  Hip internal rotation 4 4+ 4+ 4+ 4+ 5  Hip external rotation 4 4 4 4  4- 4  Knee flexion 4 5 4+ 5 4+ 5  Knee extension 4  5 4+ 5 4+ 5  Ankle dorsiflexion 4 4 4 4 4  4+  Ankle plantarflexion    4 (10 SLS HR) 4 (12 SLS HR)    Ankle inversion        Ankle eversion         (Blank rows = not tested)  FUNCTIONAL TESTS:  5 times sit to stand: 29.60 sec with weight shift to L LE Timed up and go (TUG): 23.79 sec with RW  GAIT: Distance walked: Clinic distances Assistive device utilized: Environmental consultant - 2 wheeled Level of assistance: Modified independence Gait pattern: step through pattern and decreased hip/knee flexion- Right Comments: Ambulates with stiff R knee but good heel strike on weight acceptance   TODAY'S TREATMENT:   11/16/22  TherEx  Reclining bike L3x9 minutes for w/u and ROM, full rotations seat 7-->6 Tandem stance solid surface 3x30 seconds B SLS with one foot on metal edge of TM 3x30 seconds B  Lateral step ups with wt shift to L LE x20 8 inch box  Forward lunges on 8 inch box x12 cues for strength form instead of stretching knee on top of box like prior exercises       11/14/22  TherEx  Reclining bike L3 x8 minutes for w/u and ROM full rotations  ROM check  Forward step ups x15 8 inch box cues to let knee bend when stepping down  Knee flexion stretch on 8 inch box 15x5  second holds HS curls with focus on thigh perpendicular to ground 15x3 second holds  Lateral step ups x15 8 inch box cues to let knee bend when stepping down and lateral wt shift over LE prior to step up  Discussed modifying STS form at home with R LE staggered back to help reduce favoring R LE, discussed use of bike at home to help with ROM/knee mobility     11/08/22 THERAPEUTIC EXERCISE: to improve flexibility, strength and mobility.  Demonstration, verbal and tactile cues throughout for technique. Rec Bike L2x26min Gait w/o AD  360 ft -  Step ups 8' RLE 2 x 10 cues to keep pelvis level; also used mirror for feedback Hip hikes x 15 R LE Lateral step ups 8' RLE 2 x 10  Fwd step downs 2' 1x10 then heel taps fwd (reluctant to bend knee) and lateral (easier) x 10 ea Standing R lunge  stretch x 10   11/02/22 - Recert THERAPEUTIC EXERCISE: to improve flexibility, strength and mobility.  Demonstration, verbal and tactile cues throughout for technique.  Rec Bike - L2 x 6 min  THERAPEUTIC ACTIVITIES: LEFS: 44 / 80 = 55.0 % 5xSTS = 14.91 sec w/o UE assist & even weight shift TUG = 9.78 sec w/o AD ROM and MMT assessment  Goal assessment  GAIT TRAINING: To normalize gait pattern and prepare to wean from AD. 300 ft w/o AD - mild L Trendelenburg hip drop due to glute medius weakness Stairs: Level of Assistance: Modified independence Stair Negotiation Technique: Alternating Pattern  Forwards with Single Rail on Left Number of Stairs: 14  Height of Stairs: 7"  Comments: increased effort advancing L LE to next step during R stance phase on ascent due to continued quad and glute medius weakness; decreased R eccentric quad control on descent leading to compensatory hip drop  MANUAL THERAPY: To promote normalized muscle tension, improved flexibility, improved joint mobility, and increased ROM. Hooklying tibiofemoral AP mobs to increase L knee flexion Inferior patellar glide to increase L knee flexion Gravity assisted stretch and contract/relax into L knee flexion with L LE suspended over PT's arm   10/31/22 THERAPEUTIC EXERCISE: to improve flexibility, strength and mobility.  Demonstration, verbal and tactile cues throughout for technique. Rec Bike L1x43min Gait w/o AD 270 ft - increased antalgic gait at first but decreased as we continued walking  Therapeutic Activity: to improve functional performance. Step ups 8' RLE x 10  Lateral step ups 8' RLE x 10  Stairs: 14 stairs ascending/descending- able to do reciprocal pattern but pulls herself going up on RLE, very hesitant with eccentric control on RLE going down but able to complete step down Fwd step downs 2' 2x10 RLE Standing R lunge stretch x 10  MODALITIES: Game Ready vasopneumatic compression post session to R knee  x 10 min, medium compression, 34 to reduce post-exercise pain and swelling/edema   10/26/22 THERAPEUTIC EXERCISE: to improve flexibility, strength and mobility.  Demonstration, verbal and tactile cues throughout for technique. Rec Bike L1x39min Gait w/o AD 180 ft - increased antalgic gait at first but decreased as we continued walking Seated R hamstring + gastroc stretch with strap x 30 sec R single leg deadlift 4lb x 10 touch mat table Knee flexion 15# BLE 2x10  Knee mobilizations and PROM  10/24/22 THERAPEUTIC EXERCISE: to improve flexibility, strength and mobility.  Demonstration, verbal and tactile cues throughout for technique. Sit to stand with cues to keep feet even x 10  Single leg deadlift RLE reach  to floor x 15 Step ups fwd 2 x 10 RLE; lateral 2 x 10 RLE 6' step Seated R hamstring curl GTB x 10 Seated R LAQ 3# x 10  Seated marches 3# x 10    PATIENT EDUCATION:  Education details: progress with PT, ongoing PT POC, and continue with current HEP  Person educated: Patient Education method: Medical illustrator Education comprehension: verbalized understanding and needs further education  HOME EXERCISE PROGRAM:  Access Code: [email or web address] URL: [email or web address] Date: 11/16/2022 Prepared by: Nedra Hai  Exercises - Supine Heel Slide with Strap  - 2 x daily - 7 x weekly - 2 sets - 10 reps - 3 sec hold - Heel Toe Raises with Counter Support  - 2 x daily - 7 x weekly - 2 sets - 10 reps - 3 sec hold - Standing Knee Flexion  - 2 x daily - 7 x weekly - 2 sets - 10 reps - 3 sec hold - Backward Weight Shift and Opposite Arm Raise with Walker  - 1 x daily - 7 x weekly - 2 sets - 10 reps - [place]  - 1 x daily - 7 x weekly - 2 sets - 10 reps - Sitting Knee Extension with Resistance  - 1 x daily - 7 x weekly - 2 sets - 10 reps - Seated Hamstring Curl with Anchored Resistance  - 1 x daily - 7 x weekly - 2 sets - 10 reps - Long Sitting 4 Way Patellar  Glide  - 2-3 x daily - 7 x weekly - 2 sets - 10 reps - Standing Hip Flexion with Anchored Resistance and Chair Support  - 1 x daily - 3-4 x weekly - 2 sets - 10 reps - 3 sec hold - Standing Hip Adduction with Anchored Resistance  - 1 x daily - 3-4 x weekly - 2 sets - 10 reps - 3 sec hold - Standing Hip Extension with Anchored Resistance  - 1 x daily - 3-4 x weekly - 2 sets - 10 reps - 3 sec hold - Standing Hip Abduction with Anchored Resistance  - 1 x daily - 3-4 x weekly - 2 sets - 10 reps - 3 sec hold - Side Stepping with Resistance at Ankles  - 1 x daily - 3 x weekly - 2 sets - 10 reps - Band Walks  - 1 x daily - 3 x weekly - 2 sets - 10 reps - Standing Knee Flexion Stretch on Step  - 2 x daily - 7 x weekly - 2 sets - 10 reps - 5 sec hold - Hip Hiking on Step  - 1 x daily - 3-4 x weekly - 1-2 sets - 10 reps - Step Up (Mirrored)  - 1 x daily - 3-4 x weekly - 1 sets - 10 reps - Tandem Stance in Corner  - 1 x daily - 7 x weekly - 1 sets - 6 reps - 30 seconds  hold - Single Leg Stance  - 1 x daily - 7 x weekly - 1 sets - 6 reps - 30 seconds  hold  Patient Education - Rubbing with Different Textures   ASSESSMENT:  CLINICAL IMPRESSION:  Pt arrives today doing well, doing great keeping up with HEP and remains very motivated to improve with PT and with independent exercise. Progressed all activities as appropriate and tolerated, incorporated more balance work today as well. Will continue efforts.   OBJECTIVE IMPAIRMENTS: Abnormal gait, decreased activity tolerance, decreased  balance, decreased endurance, decreased knowledge of condition, decreased knowledge of use of DME, decreased mobility, difficulty walking, decreased ROM, decreased strength, decreased safety awareness, increased edema, increased fascial restrictions, impaired perceived functional ability, increased muscle spasms, impaired flexibility, and pain.   ACTIVITY LIMITATIONS: sitting, standing, squatting, sleeping, stairs, transfers,  bed mobility, bathing, toileting, dressing, locomotion level, and caring for others  PARTICIPATION LIMITATIONS: meal prep, cleaning, laundry, driving, shopping, community activity, and yard work  PERSONAL FACTORS: Fitness, Past/current experiences, Time since onset of injury/illness/exacerbation, and 3+ comorbidities: OA, anxiety, HTN, Raynaud's phenomenon, GERD, B CTS, DVT s/p COVID in 2021, syncope  are also affecting patient's functional outcome.   REHAB POTENTIAL: Excellent  CLINICAL DECISION MAKING: Stable/uncomplicated  EVALUATION COMPLEXITY: Low   GOALS: Goals reviewed with patient? Yes  SHORT TERM GOALS: Target date: 10/12/2022  Patient will be independent with initial HEP. Baseline: pt performing hospital issued HEP Goal status: MET  10/02/22  2.  Patient will demonstrate improved R knee AROM to >/= 5-90 deg to allow for normal gait pattern. Baseline: R knee AROM -15-77, PROM -5-82 Goal status: MET  10/02/22 - R knee AROM -1-100, lacking 8 extension in LAQ  LONG TERM GOALS: Target date: 11/02/2022; extended to 11/30/2022  Patient will be independent with advanced/ongoing HEP to improve outcomes and carryover.  Baseline:  Goal status: PARTIALLY MET  10/19/22 - Met for current HEP  2.  Patient will report at least 75% improvement in R knee pain to improve QOL. Baseline: 5-6/10 Goal status: MET  11/02/22 - 90-95% improvement in pain   3.  Patient will demonstrate improved R knee AROM to >/= -2-120 deg to allow for normal gait and stair mechanics. Baseline: R knee AROM -15-77, PROM -5-82 Goal status: IN PROGRESS  11/02/22 - R knee AROM 0-107, with full extension to 0 in LAQ  4.  Patient will demonstrate improved B LE strength to >/= 4+/5 for improved stability and ease of mobility. Baseline: refer to above LE MMT table Goal status: IN PROGRESS 11/02/22 - strength improving but R LE remains weaker than L (refer to above LE MMT table)  5.  Patient will be able to  ambulate 600' with or w/o LRAD and normal gait pattern without increased pain to access community.  Baseline: antalgic gait with RW Goal status: IN PROGRESS  11/02/22 - 300' with mild L Trendelenburg gait pattern noted without AD  6. Patient will be able to ascend/descend stairs with 1 HR and reciprocal step pattern safely to access home and community.  Baseline: NT Goal status: IN PROGRESS  11/02/22 - increased effort advancing L LE to next step during R stance phase on ascent due to continued quad and glute medius weakness; decreased R eccentric quad control on descent leading to compensatory hip drop  7.  Patient will report >/= 30/80 on LEFS to demonstrate improved functional ability. Baseline: 13 / 80 = 16.3 % Goal status: MET  11/02/22 - 44 / 80 = 55.0 %  8.  Patient will improve 5x STS time to </= 15 seconds with even weight shift to demonstrate improved functional strength and transfer efficiency . Baseline: 29.60 sec with weight shift to L LE Goal status: MET  11/02/22 - 14.91 sec  9.  Patient will demonstrate decreased TUG time to </= 13.5 sec with or w/o LRAD to decrease risk for falls with transitional mobility Baseline: 23.79 sec with RW Goal status: MET  11/02/22 - 9.78 sec   PLAN:  PT FREQUENCY: 2x/week  PT DURATION: 4 weeks  PLANNED INTERVENTIONS: 97164- PT Re-evaluation, 97110-Therapeutic exercises, 97530- Therapeutic activity, 97112- Neuromuscular re-education, 97535- Self Care, 41324- Manual therapy, [phone]- Gait training, 97014- Electrical stimulation (unattended), 97016- Vasopneumatic device, 97035- Ultrasound, 72536- Ionotophoresis 4mg /ml Dexamethasone, Patient/Family education, Balance training, Stair training, Taping, Dry Needling, Joint mobilization, DME instructions, Cryotherapy, Moist heat, Therapeutic exercises, Therapeutic activity, Neuromuscular re-education, Gait training, and Self Care  PLAN FOR NEXT SESSION: review & update HEP PRN - add quad stretching  and eccentric quad & glute med strengthening; progress R knee ROM (emphasis on flexion ROM) and B LE strengthening - progress standing activities/functional strengthening; gait training to normalize gait pattern w/o AD; MT for increased R knee ROM and edema management;, balance. weekly R knee AROM assessment  Nedra Hai, PT, DPT 11/16/22 11:01 AM     Date of referral: 08/10/22 Referring provider: Joen Laura, MD Referring diagnosis? U44.034 (ICD-10-CM) - Status post right partial knee replacement  Treatment diagnosis? (if different than referring diagnosis)  Stiffness of right knee, not elsewhere classified  Other abnormalities of gait and mobility  Muscle weakness (generalized)  Localized edema  Acute pain of right knee  What was this (referring dx) caused by? Surgery (Type: R unicondylar knee replacement) and Arthritis  Nature of Condition: Initial Onset (within last 3 months)   Laterality: Rt  Current Functional Measure Score: LEFS 44 / 80 = 55.0 %  Objective measurements identify impairments when they are compared to normal values, the uninvolved extremity, and prior level of function.  [x]  Yes  []  No  Objective assessment of functional ability: Moderate functional limitations   Briefly describe symptoms: Ongoing deficits include intermittent R knee pain, increased edema in R LE, limited and painful endrange R knee flexion ROM, R>L LE weakness and impaired ADLs, gait and mobility, especially stair negotiation.  How did symptoms start: OA leading to R unicondylar knee replacement on 09/18/22  Average pain intensity:  Last 24 hours: 0-1/10  Past week: 1/10  How often does the pt experience symptoms? Intermittently  How much have the symptoms interfered with usual daily activities? Moderately  How has condition changed since care began at this facility? Better  In general, how is the patients overall health? Very Good

## 2022-11-17 ENCOUNTER — Ambulatory Visit (INDEPENDENT_AMBULATORY_CARE_PROVIDER_SITE_OTHER): Payer: Medicare Other | Admitting: Family Medicine

## 2022-11-17 ENCOUNTER — Encounter: Payer: Self-pay | Admitting: Family Medicine

## 2022-11-17 VITALS — BP 136/80 | HR 84 | Temp 98.4°F | Wt 218.0 lb

## 2022-11-17 DIAGNOSIS — R5383 Other fatigue: Secondary | ICD-10-CM | POA: Diagnosis not present

## 2022-11-17 LAB — POC URINALSYSI DIPSTICK (AUTOMATED)
Bilirubin, UA: NEGATIVE
Blood, UA: NEGATIVE
Glucose, UA: NEGATIVE
Ketones, UA: NEGATIVE
Nitrite, UA: NEGATIVE
Protein, UA: NEGATIVE
Spec Grav, UA: 1.01 (ref 1.010–1.025)
Urobilinogen, UA: 0.2 U/dL
pH, UA: 6 (ref 5.0–8.0)

## 2022-11-17 NOTE — Progress Notes (Signed)
   Subjective:    Patient ID: Megan Crane, female    DOB: [birth year], 72 y.o.   MRN: [id number]  HPI Here to follow up after a partial arthroplasty to the right knee on 09-18-22. This went well and she is getting PT for strength and balance. She feels a little fatigued and she asks to check a urine sample. She has no urinary symptoms.    Review of Systems  Constitutional:  Positive for fatigue. Negative for fever.  Respiratory: Negative.    Cardiovascular: Negative.   Gastrointestinal: Negative.   Genitourinary: Negative.   Musculoskeletal:  Positive for arthralgias.       Objective:   Physical Exam Constitutional:      Appearance: Normal appearance.     Comments: Walks with a cane   Cardiovascular:     Rate and Rhythm: Normal rate and regular rhythm.     Pulses: Normal pulses.     Heart sounds: Normal heart sounds.  Pulmonary:     Effort: Pulmonary effort is normal.     Breath sounds: Normal breath sounds.  Abdominal:     Tenderness: There is no right CVA tenderness or left CVA tenderness.  Neurological:     Mental Status: She is alert.           Assessment & Plan:  She is recovering from knee surgery, and she will continue PT. We will culture her urine sample. Gershon Crane, MD

## 2022-11-17 NOTE — Addendum Note (Signed)
Addended by: Carola Rhine on: 11/17/2022 11:53 AM   Modules accepted: Orders

## 2022-11-19 LAB — URINE CULTURE
MICRO NUMBER:: 15706097
SPECIMEN QUALITY:: ADEQUATE

## 2022-11-20 ENCOUNTER — Telehealth: Payer: Self-pay | Admitting: Family Medicine

## 2022-11-20 ENCOUNTER — Other Ambulatory Visit: Payer: Self-pay

## 2022-11-20 MED ORDER — NITROFURANTOIN MONOHYD MACRO 100 MG PO CAPS: 100.0000 mg | ORAL_CAPSULE | Freq: Two times a day (BID) | ORAL | 0 refills | Status: DC

## 2022-11-20 NOTE — Telephone Encounter (Addendum)
Pt was just seen on 11/17/22. Pt called to say she is still feeling very weak, and has terrible itching  (UTI). Pt would like a call back to discuss.

## 2022-11-21 ENCOUNTER — Ambulatory Visit: Payer: Medicare Other

## 2022-11-21 DIAGNOSIS — M25561 Pain in right knee: Secondary | ICD-10-CM | POA: Diagnosis not present

## 2022-11-21 DIAGNOSIS — R2689 Other abnormalities of gait and mobility: Secondary | ICD-10-CM

## 2022-11-21 DIAGNOSIS — R600 Localized edema: Secondary | ICD-10-CM

## 2022-11-21 DIAGNOSIS — M25661 Stiffness of right knee, not elsewhere classified: Secondary | ICD-10-CM | POA: Diagnosis not present

## 2022-11-21 DIAGNOSIS — M6281 Muscle weakness (generalized): Secondary | ICD-10-CM | POA: Diagnosis not present

## 2022-11-21 NOTE — Therapy (Signed)
OUTPATIENT PHYSICAL THERAPY TREATMENT NOTE  Patient Name: Megan Crane MRN: [id number] DOB:May 15, 1950, 72 y.o., female Today's Date: 11/21/2022   END OF SESSION:  PT End of Session - 11/21/22 1238     Visit Number 19    Date for PT Re-Evaluation 11/30/22    Authorization Type [employer] Medicare    Authorization Time Period 1027/11/30    Authorization - Visit Number 4    Authorization - Number of Visits 8    Progress Note Due on Visit 25    PT Start Time 1150    PT Stop Time 1240    PT Time Calculation (min) 50 min    Activity Tolerance Patient tolerated treatment well    Behavior During Therapy WFL for tasks assessed/performed                             Past Medical History:  Diagnosis Date   Anxiety disorder    hx vasovagal responses   Arthritis    Cancer (HCC)    Deep vein thrombosis (DVT) (HCC)    hx of in left left after covid in 2021   Headache(784.0)    History of kidney stones    Hydronephrosis, right    Hyperlipidemia    Hypertension    Nephrolithiasis    Pneumonia    Positive nasal culture for methicillin resistant Staphylococcus aureus    Right ureteral stone    Thrombophlebitis of leg, left, superficial 11/17/2020   Urinary incontinence    sees Dr. McDiarmid    Vasovagal syncope 08/02/2022   Past Surgical History:  Procedure Laterality Date   BLADDER REPAIR     COLONOSCOPY  12/28/2015   per Dr. Adela Lank, serrated polyps and diverticula, repeat in 3 yrs    CYSTOSCOPY  06/09/2011   Procedure: CYSTOSCOPY;  Surgeon: Martina Sinner, MD;  Location: [facility];  Service: Urology;  Laterality: N/A;   CYSTOSCOPY W/ URETERAL STENT PLACEMENT  02/03/2014   Procedure: CYSTOSCOPY WITH  RIGHT RETROGRADE PYELOGRAM/ RIGHT URETERAL STENT PLACEMENT;  Surgeon: Anner Crete, MD;  Location: [facility];  Service: Urology;;   CYSTOSCOPY WITH BIOPSY  02/03/2014   Procedure: CYSTOSCOPY WITH BIOPSY;  Surgeon: Anner Crete, MD;  Location:  [facility];  Service: Urology;;   PARTIAL KNEE ARTHROPLASTY Right 09/18/2022   Procedure: UNICOMPARTMENTAL KNEE;  Surgeon: Joen Laura, MD;  Location: [facility];  Service: Orthopedics;  Laterality: Right;   PILONIDAL CYST EXCISION  1974   RECTOCELE REPAIR  06/09/2011   Procedure: POSTERIOR REPAIR (RECTOCELE);  Surgeon: Martina Sinner, MD;  Location: [facility];  Service: Urology;  Laterality: N/A;  Xenform graft 6x10   RIGHT URETEROSCOPIC STONE EXTRACTION / STENT PLACEMENT  03/13/2000   URETEROSCOPY Right 02/03/2014   Procedure: URETEROSCOPY WITH MANAGEMENT OF URETERAL STRICTURE;  Surgeon: Anner Crete, MD;  Location: [facility];  Service: Urology;  Laterality: Right;   VAGINAL HYSTERECTOMY  06/09/2011   Procedure: HYSTERECTOMY VAGINAL;  Surgeon: Mickel Baas, MD;  Location: [facility];  Service: Gynecology;  Laterality: N/A;   VAGINAL PROLAPSE REPAIR  06/09/2011   Procedure: VAGINAL VAULT SUSPENSION;  Surgeon: Martina Sinner, MD;  Location: [facility];  Service: Urology;  Laterality: N/A;   VEIN LIGATION AND STRIPPING     LEFT LEG   Patient Active Problem List   Diagnosis Date Noted   S/P right unicompartmental knee replacement 09/18/2022   IC (  interstitial cystitis) 09/04/2022   Pelvic pressure in female 08/22/2022   Visit for pre-operative examination 08/07/2022   Vasovagal syncope 08/02/2022   Nasal dryness 06/30/2022   Precancerous skin lesion 06/30/2022   Bilateral carpal tunnel syndrome 03/02/2022   Swelling of both hands 03/02/2022   Atypical chest pain 03/02/2022   OAB (overactive bladder) 11/23/2021   Corn of foot 11/23/2021   Atrophic vaginitis 11/23/2021   COVID-19 virus infection 11/17/2020   Raynaud's phenomenon without gangrene 11/17/2020   Primary osteoarthritis involving multiple joints 11/24/2019   GERD (gastroesophageal reflux disease) 12/26/2016   Edema 05/29/2014   Nasal colonization with methicillin-resistant Staphylococcus  aureus 01/22/2014   Varicose veins of bilateral lower extremities with other complications 12/11/2012   Allergic rhinitis 11/29/2009   MOTION SICKNESS 11/29/2009   Hyperlipidemia 08/31/2008   Anxiety state 08/31/2008   Primary hypertension 08/31/2008   PILONIDAL CYST 08/31/2008   HEADACHE 08/31/2008   URINARY INCONTINENCE 08/31/2008   NEPHROLITHIASIS, HX OF 08/31/2008   POSTNASAL DRIP SYNDROME 05/28/2006    PCP: Nelwyn Salisbury, MD   REFERRING PROVIDER: Joen Laura, MD   REFERRING DIAG: [phone] (ICD-10-CM) - Status post right partial knee replacement   THERAPY DIAG:  Stiffness of right knee, not elsewhere classified  Other abnormalities of gait and mobility  Muscle weakness (generalized)  Localized edema  Acute pain of right knee  RATIONALE FOR EVALUATION AND TREATMENT: Rehabilitation  ONSET DATE: 09/18/2022 - R unicompartmental knee replacement  NEXT MD VISIT: 12/12/2022    SUBJECTIVE:                                                                                                                                                                                                         SUBJECTIVE STATEMENT: Pt reports having a UTI so not feeling too great today.  PAIN: Are you having pain? Yes: NPRS scale: 2/10 Pain location: front of right knee and R ankle  Pain description: burning, aching, sore  Aggravating factors: too much activity Relieving factors: exercise, warm showers, ice   PERTINENT HISTORY:  OA, anxiety, HTN, Raynaud's phenomenon, GERD, B CTS, DVT s/p COVID in 2021, syncope  PRECAUTIONS: None  RED FLAGS: None  WEIGHT BEARING RESTRICTIONS: No  FALLS:  Has patient fallen in last 6 months? No  LIVING ENVIRONMENT: Lives with: lives with their spouse Lives in: House/apartment Stairs: Yes: External: 1+2 steps; none Has following equipment at home: Single point cane, Walker - 2 wheeled, Tour manager, and bed side commode  OCCUPATION:  Retired  PLOF: Independent and Leisure: reading, quilting, Training and development officer,  yardwork, Silver Sneakers at [employer] ~2x/month  PATIENT GOALS: "Be able to go up/down steps in motor home so we can travel. Go back to work part-time."   OBJECTIVE: (objective measures completed at initial evaluation unless otherwise dated)  DIAGNOSTIC FINDINGS:  09/18/22 - DG R knee:  IMPRESSION: Medial compartment hemiarthroplasty without immediate postoperative complication.  PATIENT SURVEYS:  LEFS 13 / 80 = 16.3 %  11/02/22: 44 / 80 = 55.0 %  COGNITION: Overall cognitive status: Within functional limits for tasks assessed    SENSATION: WFL  EDEMA:  Circumferential: 44 cm at joint line, 53 cm (5 cm above joint line)  MUSCLE LENGTH: Hamstrings: Mild/mod tight R>L ITB: Mild/mod tight R>L Piriformis: Mild tight R>L Hip flexors: Mild tight R>L Quads: Mod tight R>L Heelcord: NT  POSTURE:  No Significant postural limitations  PALPATION: R knee swollen and slightly warm to touch with mild erythema present.  Decreased R patellar mobility in all directions.  LOWER EXTREMITY ROM:  Active ROM Right eval Left eval R 09/27/22 R 09/29/22 R 10/02/22 R 10/06/22 R 10/12/22 R 10/19/22 R 10/26/22 R 11/02/22 R 11/14/22  Knee flexion 77 127 85 91 100 105 104 100 105 107  107*  Knee extension -15 LAQ +2 -10 LAQ -9 LAQ -8 LAQ -7 LAQ -4 LAQ 0 LAQ 0 LAQ 0 LAQ 0*   Passive ROM Right eval R 09/27/22 R 10/02/22 R 10/12/22  Knee flexion 82 85    Knee extension -5 0 -1 0   LOWER EXTREMITY MMT:  MMT Right eval Left eval R 10/19/22 L 10/19/22 R 11/02/22 L 11/02/22  Hip flexion 4+ 5 4+ 5 4+ 5  Hip extension 4 4 4 4 4 4   Hip abduction 4- 4 4 4  4- 4  Hip adduction 4 4+ 4+ 4+ 4+ 4+  Hip internal rotation 4 4+ 4+ 4+ 4+ 5  Hip external rotation 4 4 4 4  4- 4  Knee flexion 4 5 4+ 5 4+ 5  Knee extension 4  5 4+ 5 4+ 5  Ankle dorsiflexion 4 4 4 4 4  4+  Ankle plantarflexion   4 (10 SLS HR) 4 (12 SLS HR)    Ankle inversion         Ankle eversion         (Blank rows = not tested)  FUNCTIONAL TESTS:  5 times sit to stand: 29.60 sec with weight shift to L LE Timed up and go (TUG): 23.79 sec with RW  GAIT: Distance walked: Clinic distances Assistive device utilized: Environmental consultant - 2 wheeled Level of assistance: Modified independence Gait pattern: step through pattern and decreased hip/knee flexion- Right Comments: Ambulates with stiff R knee but good heel strike on weight acceptance   TODAY'S TREATMENT:  11/21/22 THERAPEUTIC EXERCISE: to improve flexibility, strength and mobility.  Demonstration, verbal and tactile cues throughout for technique. Nustep L5x61min UE/LE Step ups 6' 2x10 RLE Lateral step ups 6' 2x10 RLE Step downs from 2' book 2x10 RLE STM to R quads  Patellar mobs all directions Game Ready vasopneumatic compression post session to R knee x 10 min, medium compression, 34 to reduce post-exercise pain and swelling/edema 11/16/22  TherEx  Reclining bike L3x9 minutes for w/u and ROM, full rotations seat 7-->6 Tandem stance solid surface 3x30 seconds B SLS with one foot on metal edge of TM 3x30 seconds B  Lateral step ups with wt shift to L LE x20 8 inch box  Forward lunges on 8 inch box x12 cues for strength  form instead of stretching knee on top of box like prior exercises       11/14/22  TherEx  Reclining bike L3 x8 minutes for w/u and ROM full rotations  ROM check  Forward step ups x15 8 inch box cues to let knee bend when stepping down  Knee flexion stretch on 8 inch box 15x5 second holds HS curls with focus on thigh perpendicular to ground 15x3 second holds  Lateral step ups x15 8 inch box cues to let knee bend when stepping down and lateral wt shift over LE prior to step up  Discussed modifying STS form at home with R LE staggered back to help reduce favoring R LE, discussed use of bike at home to help with ROM/knee mobility     11/08/22 THERAPEUTIC EXERCISE: to improve  flexibility, strength and mobility.  Demonstration, verbal and tactile cues throughout for technique. Rec Bike L2x62min Gait w/o AD  360 ft -  Step ups 8' RLE 2 x 10 cues to keep pelvis level; also used mirror for feedback Hip hikes x 15 R LE Lateral step ups 8' RLE 2 x 10  Fwd step downs 2' 1x10 then heel taps fwd (reluctant to bend knee) and lateral (easier) x 10 ea Standing R lunge stretch x 10   11/02/22 - Recert THERAPEUTIC EXERCISE: to improve flexibility, strength and mobility.  Demonstration, verbal and tactile cues throughout for technique.  Rec Bike - L2 x 6 min  THERAPEUTIC ACTIVITIES: LEFS: 44 / 80 = 55.0 % 5xSTS = 14.91 sec w/o UE assist & even weight shift TUG = 9.78 sec w/o AD ROM and MMT assessment  Goal assessment  GAIT TRAINING: To normalize gait pattern and prepare to wean from AD. 300 ft w/o AD - mild L Trendelenburg hip drop due to glute medius weakness Stairs: Level of Assistance: Modified independence Stair Negotiation Technique: Alternating Pattern  Forwards with Single Rail on Left Number of Stairs: 14  Height of Stairs: 7"  Comments: increased effort advancing L LE to next step during R stance phase on ascent due to continued quad and glute medius weakness; decreased R eccentric quad control on descent leading to compensatory hip drop  MANUAL THERAPY: To promote normalized muscle tension, improved flexibility, improved joint mobility, and increased ROM. Hooklying tibiofemoral AP mobs to increase L knee flexion Inferior patellar glide to increase L knee flexion Gravity assisted stretch and contract/relax into L knee flexion with L LE suspended over PT's arm   10/31/22 THERAPEUTIC EXERCISE: to improve flexibility, strength and mobility.  Demonstration, verbal and tactile cues throughout for technique. Rec Bike L1x36min Gait w/o AD 270 ft - increased antalgic gait at first but decreased as we continued walking  Therapeutic Activity: to improve functional  performance. Step ups 8' RLE x 10  Lateral step ups 8' RLE x 10  Stairs: 14 stairs ascending/descending- able to do reciprocal pattern but pulls herself going up on RLE, very hesitant with eccentric control on RLE going down but able to complete step down Fwd step downs 2' 2x10 RLE Standing R lunge stretch x 10  MODALITIES: Game Ready vasopneumatic compression post session to R knee x 10 min, medium compression, 34 to reduce post-exercise pain and swelling/edema   10/26/22 THERAPEUTIC EXERCISE: to improve flexibility, strength and mobility.  Demonstration, verbal and tactile cues throughout for technique. Rec Bike L1x73min Gait w/o AD 180 ft - increased antalgic gait at first but decreased as we continued walking Seated R hamstring + gastroc stretch  with strap x 30 sec R single leg deadlift 4lb x 10 touch mat table Knee flexion 15# BLE 2x10  Knee mobilizations and PROM  10/24/22 THERAPEUTIC EXERCISE: to improve flexibility, strength and mobility.  Demonstration, verbal and tactile cues throughout for technique. Sit to stand with cues to keep feet even x 10  Single leg deadlift RLE reach to floor x 15 Step ups fwd 2 x 10 RLE; lateral 2 x 10 RLE 6' step Seated R hamstring curl GTB x 10 Seated R LAQ 3# x 10  Seated marches 3# x 10    PATIENT EDUCATION:  Education details: progress with PT, ongoing PT POC, and continue with current HEP  Person educated: Patient Education method: Medical illustrator Education comprehension: verbalized understanding and needs further education  HOME EXERCISE PROGRAM:  Access Code: [email or web address] URL: [email or web address] Date: 11/16/2022 Prepared by: Nedra Hai  Exercises - Supine Heel Slide with Strap  - 2 x daily - 7 x weekly - 2 sets - 10 reps - 3 sec hold - Heel Toe Raises with Counter Support  - 2 x daily - 7 x weekly - 2 sets - 10 reps - 3 sec hold - Standing Knee Flexion  - 2 x daily - 7 x weekly - 2 sets - 10 reps  - 3 sec hold - Backward Weight Shift and Opposite Arm Raise with Walker  - 1 x daily - 7 x weekly - 2 sets - 10 reps - [place]  - 1 x daily - 7 x weekly - 2 sets - 10 reps - Sitting Knee Extension with Resistance  - 1 x daily - 7 x weekly - 2 sets - 10 reps - Seated Hamstring Curl with Anchored Resistance  - 1 x daily - 7 x weekly - 2 sets - 10 reps - Long Sitting 4 Way Patellar Glide  - 2-3 x daily - 7 x weekly - 2 sets - 10 reps - Standing Hip Flexion with Anchored Resistance and Chair Support  - 1 x daily - 3-4 x weekly - 2 sets - 10 reps - 3 sec hold - Standing Hip Adduction with Anchored Resistance  - 1 x daily - 3-4 x weekly - 2 sets - 10 reps - 3 sec hold - Standing Hip Extension with Anchored Resistance  - 1 x daily - 3-4 x weekly - 2 sets - 10 reps - 3 sec hold - Standing Hip Abduction with Anchored Resistance  - 1 x daily - 3-4 x weekly - 2 sets - 10 reps - 3 sec hold - Side Stepping with Resistance at Ankles  - 1 x daily - 3 x weekly - 2 sets - 10 reps - Band Walks  - 1 x daily - 3 x weekly - 2 sets - 10 reps - Standing Knee Flexion Stretch on Step  - 2 x daily - 7 x weekly - 2 sets - 10 reps - 5 sec hold - Hip Hiking on Step  - 1 x daily - 3-4 x weekly - 1-2 sets - 10 reps - Step Up (Mirrored)  - 1 x daily - 3-4 x weekly - 1 sets - 10 reps - Tandem Stance in Corner  - 1 x daily - 7 x weekly - 1 sets - 6 reps - 30 seconds  hold - Single Leg Stance  - 1 x daily - 7 x weekly - 1 sets - 6 reps - 30 seconds  hold  Patient Education -  Rubbing with Different Textures   ASSESSMENT:  CLINICAL IMPRESSION:  Pt biggest limitation is stair naviagtion so we focused exercises on this today. She had some TrPs in the R quads but reported relief after the MT today. Still restriced in patellar mobility. Pt continues to benefit from skilled PT to improve function and meet goals. GR post session to address pain and swelling.  OBJECTIVE IMPAIRMENTS: Abnormal gait, decreased activity tolerance,  decreased balance, decreased endurance, decreased knowledge of condition, decreased knowledge of use of DME, decreased mobility, difficulty walking, decreased ROM, decreased strength, decreased safety awareness, increased edema, increased fascial restrictions, impaired perceived functional ability, increased muscle spasms, impaired flexibility, and pain.   ACTIVITY LIMITATIONS: sitting, standing, squatting, sleeping, stairs, transfers, bed mobility, bathing, toileting, dressing, locomotion level, and caring for others  PARTICIPATION LIMITATIONS: meal prep, cleaning, laundry, driving, shopping, community activity, and yard work  PERSONAL FACTORS: Fitness, Past/current experiences, Time since onset of injury/illness/exacerbation, and 3+ comorbidities: OA, anxiety, HTN, Raynaud's phenomenon, GERD, B CTS, DVT s/p COVID in 2021, syncope  are also affecting patient's functional outcome.   REHAB POTENTIAL: Excellent  CLINICAL DECISION MAKING: Stable/uncomplicated  EVALUATION COMPLEXITY: Low   GOALS: Goals reviewed with patient? Yes  SHORT TERM GOALS: Target date: 10/12/2022  Patient will be independent with initial HEP. Baseline: pt performing hospital issued HEP Goal status: MET  10/02/22  2.  Patient will demonstrate improved R knee AROM to >/= 5-90 deg to allow for normal gait pattern. Baseline: R knee AROM -15-77, PROM -5-82 Goal status: MET  10/02/22 - R knee AROM -1-100, lacking 8 extension in LAQ  LONG TERM GOALS: Target date: 11/02/2022; extended to 11/30/2022  Patient will be independent with advanced/ongoing HEP to improve outcomes and carryover.  Baseline:  Goal status: PARTIALLY MET  10/19/22 - Met for current HEP  2.  Patient will report at least 75% improvement in R knee pain to improve QOL. Baseline: 5-6/10 Goal status: MET  11/02/22 - 90-95% improvement in pain   3.  Patient will demonstrate improved R knee AROM to >/= -2-120 deg to allow for normal gait and stair  mechanics. Baseline: R knee AROM -15-77, PROM -5-82 Goal status: IN PROGRESS  11/02/22 - R knee AROM 0-107, with full extension to 0 in LAQ  4.  Patient will demonstrate improved B LE strength to >/= 4+/5 for improved stability and ease of mobility. Baseline: refer to above LE MMT table Goal status: IN PROGRESS 11/02/22 - strength improving but R LE remains weaker than L (refer to above LE MMT table)  5.  Patient will be able to ambulate 600' with or w/o LRAD and normal gait pattern without increased pain to access community.  Baseline: antalgic gait with RW Goal status: IN PROGRESS  11/02/22 - 300' with mild L Trendelenburg gait pattern noted without AD  6. Patient will be able to ascend/descend stairs with 1 HR and reciprocal step pattern safely to access home and community.  Baseline: NT Goal status: IN PROGRESS  11/02/22 - increased effort advancing L LE to next step during R stance phase on ascent due to continued quad and glute medius weakness; decreased R eccentric quad control on descent leading to compensatory hip drop  7.  Patient will report >/= 30/80 on LEFS to demonstrate improved functional ability. Baseline: 13 / 80 = 16.3 % Goal status: MET  11/02/22 - 44 / 80 = 55.0 %  8.  Patient will improve 5x STS time to </= 15 seconds with even  weight shift to demonstrate improved functional strength and transfer efficiency . Baseline: 29.60 sec with weight shift to L LE Goal status: MET  11/02/22 - 14.91 sec  9.  Patient will demonstrate decreased TUG time to </= 13.5 sec with or w/o LRAD to decrease risk for falls with transitional mobility Baseline: 23.79 sec with RW Goal status: MET  11/02/22 - 9.78 sec   PLAN:  PT FREQUENCY: 2x/week   PT DURATION: 4 weeks  PLANNED INTERVENTIONS: 97164- PT Re-evaluation, 97110-Therapeutic exercises, 97530- Therapeutic activity, 97112- Neuromuscular re-education, 97535- Self Care, 16109- Manual therapy, 97116- Gait training, 97014-  Electrical stimulation (unattended), 97016- Vasopneumatic device, 97035- Ultrasound, 60454- Ionotophoresis 4mg /ml Dexamethasone, Patient/Family education, Balance training, Stair training, Taping, Dry Needling, Joint mobilization, DME instructions, Cryotherapy, Moist heat, Therapeutic exercises, Therapeutic activity, Neuromuscular re-education, Gait training, and Self Care  PLAN FOR NEXT SESSION: review & update HEP PRN - add quad stretching and eccentric quad & glute med strengthening; progress R knee ROM (emphasis on flexion ROM) and B LE strengthening - progress standing activities/functional strengthening; gait training to normalize gait pattern w/o AD; MT for increased R knee ROM and edema management;, balance. weekly R knee AROM assessment  Darleene Cleaver, PTA  11/21/22 1:06 PM     Date of referral: 08/10/22 Referring provider: Joen Laura, MD Referring diagnosis? U98.119 (ICD-10-CM) - Status post right partial knee replacement  Treatment diagnosis? (if different than referring diagnosis)  Stiffness of right knee, not elsewhere classified  Other abnormalities of gait and mobility  Muscle weakness (generalized)  Localized edema  Acute pain of right knee  What was this (referring dx) caused by? Surgery (Type: R unicondylar knee replacement) and Arthritis  Nature of Condition: Initial Onset (within last 3 months)   Laterality: Rt  Current Functional Measure Score: LEFS 44 / 80 = 55.0 %  Objective measurements identify impairments when they are compared to normal values, the uninvolved extremity, and prior level of function.  [x]  Yes  []  No  Objective assessment of functional ability: Moderate functional limitations   Briefly describe symptoms: Ongoing deficits include intermittent R knee pain, increased edema in R LE, limited and painful endrange R knee flexion ROM, R>L LE weakness and impaired ADLs, gait and mobility, especially stair negotiation.  How did symptoms  start: OA leading to R unicondylar knee replacement on 09/18/22  Average pain intensity:  Last 24 hours: 0-1/10  Past week: 1/10  How often does the pt experience symptoms? Intermittently  How much have the symptoms interfered with usual daily activities? Moderately  How has condition changed since care began at this facility? Better  In general, how is the patients overall health? Very Good

## 2022-11-21 NOTE — Telephone Encounter (Signed)
Spoke with pt aware that we sent in antibiotics to her pharmacy fr UTI

## 2022-11-22 NOTE — Telephone Encounter (Signed)
Pt is calling and is on abx however she is still having vaginal itching . Pt has taken 4 pills already. Pt would like callback

## 2022-11-23 ENCOUNTER — Other Ambulatory Visit: Payer: Self-pay

## 2022-11-23 ENCOUNTER — Encounter: Payer: Self-pay | Admitting: Physical Therapy

## 2022-11-23 ENCOUNTER — Ambulatory Visit: Payer: Medicare Other | Admitting: Physical Therapy

## 2022-11-23 DIAGNOSIS — M6281 Muscle weakness (generalized): Secondary | ICD-10-CM

## 2022-11-23 DIAGNOSIS — R2689 Other abnormalities of gait and mobility: Secondary | ICD-10-CM

## 2022-11-23 DIAGNOSIS — M25561 Pain in right knee: Secondary | ICD-10-CM

## 2022-11-23 DIAGNOSIS — R600 Localized edema: Secondary | ICD-10-CM | POA: Diagnosis not present

## 2022-11-23 DIAGNOSIS — M25661 Stiffness of right knee, not elsewhere classified: Secondary | ICD-10-CM

## 2022-11-23 MED ORDER — FLUCONAZOLE 150 MG PO TABS: 150.0000 mg | ORAL_TABLET | ORAL | 4 refills | Status: DC | PRN

## 2022-11-23 NOTE — Telephone Encounter (Signed)
New Rx sent and left pt a message for pt to pick up Rx from pharmacy

## 2022-11-23 NOTE — Telephone Encounter (Signed)
This sounds like a yeast infection. Call in Diflucan 150 mg to take as needed, #1 with 4 rf

## 2022-11-23 NOTE — Therapy (Signed)
OUTPATIENT PHYSICAL THERAPY TREATMENT   Patient Name: Megan Crane MRN: [id number] DOB:04-Jan-1951, 72 y.o., female Today's Date: 11/23/2022   END OF SESSION:  PT End of Session - 11/23/22 1152     Visit Number 20    Date for PT Re-Evaluation 11/30/22    Authorization Type [employer]    Authorization Time Period 11/07/22 - 12/05/22    Authorization - Visit Number 5    Authorization - Number of Visits 8    Progress Note Due on Visit 25    PT Start Time 1152   Pt arrived late   PT Stop Time 1248    PT Time Calculation (min) 56 min    Activity Tolerance Patient tolerated treatment well    Behavior During Therapy WFL for tasks assessed/performed                              Past Medical History:  Diagnosis Date   Anxiety disorder    hx vasovagal responses   Arthritis    Cancer (HCC)    Deep vein thrombosis (DVT) (HCC)    hx of in left left after covid in 2021   Headache(784.0)    History of kidney stones    Hydronephrosis, right    Hyperlipidemia    Hypertension    Nephrolithiasis    Pneumonia    Positive nasal culture for methicillin resistant Staphylococcus aureus    Right ureteral stone    Thrombophlebitis of leg, left, superficial 11/17/2020   Urinary incontinence    sees Dr. McDiarmid    Vasovagal syncope 08/02/2022   Past Surgical History:  Procedure Laterality Date   BLADDER REPAIR     COLONOSCOPY  12/28/2015   per Dr. Adela Lank, serrated polyps and diverticula, repeat in 3 yrs    CYSTOSCOPY  06/09/2011   Procedure: CYSTOSCOPY;  Surgeon: Martina Sinner, MD;  Location: [facility];  Service: Urology;  Laterality: N/A;   CYSTOSCOPY W/ URETERAL STENT PLACEMENT  02/03/2014   Procedure: CYSTOSCOPY WITH  RIGHT RETROGRADE PYELOGRAM/ RIGHT URETERAL STENT PLACEMENT;  Surgeon: Anner Crete, MD;  Location: [facility];  Service: Urology;;   CYSTOSCOPY WITH BIOPSY  02/03/2014   Procedure: CYSTOSCOPY WITH BIOPSY;  Surgeon: Anner Crete, MD;  Location: [facility];  Service: Urology;;   PARTIAL KNEE ARTHROPLASTY Right 09/18/2022   Procedure: UNICOMPARTMENTAL KNEE;  Surgeon: Joen Laura, MD;  Location: [facility];  Service: Orthopedics;  Laterality: Right;   PILONIDAL CYST EXCISION  1974   RECTOCELE REPAIR  06/09/2011   Procedure: POSTERIOR REPAIR (RECTOCELE);  Surgeon: Martina Sinner, MD;  Location: [facility];  Service: Urology;  Laterality: N/A;  Xenform graft 6x10   RIGHT URETEROSCOPIC STONE EXTRACTION / STENT PLACEMENT  03/13/2000   URETEROSCOPY Right 02/03/2014   Procedure: URETEROSCOPY WITH MANAGEMENT OF URETERAL STRICTURE;  Surgeon: Anner Crete, MD;  Location: [facility];  Service: Urology;  Laterality: Right;   VAGINAL HYSTERECTOMY  06/09/2011   Procedure: HYSTERECTOMY VAGINAL;  Surgeon: Mickel Baas, MD;  Location: [facility];  Service: Gynecology;  Laterality: N/A;   VAGINAL PROLAPSE REPAIR  06/09/2011   Procedure: VAGINAL VAULT SUSPENSION;  Surgeon: Martina Sinner, MD;  Location: [facility];  Service: Urology;  Laterality: N/A;   VEIN LIGATION AND STRIPPING     LEFT LEG   Patient Active Problem List   Diagnosis Date Noted   S/P right  unicompartmental knee replacement 09/18/2022   IC (interstitial cystitis) 09/04/2022   Pelvic pressure in female 08/22/2022   Visit for pre-operative examination 08/07/2022   Vasovagal syncope 08/02/2022   Nasal dryness 06/30/2022   Precancerous skin lesion 06/30/2022   Bilateral carpal tunnel syndrome 03/02/2022   Swelling of both hands 03/02/2022   Atypical chest pain 03/02/2022   OAB (overactive bladder) 11/23/2021   Corn of foot 11/23/2021   Atrophic vaginitis 11/23/2021   COVID-19 virus infection 11/17/2020   Raynaud's phenomenon without gangrene 11/17/2020   Primary osteoarthritis involving multiple joints 11/24/2019   GERD (gastroesophageal reflux disease) 12/26/2016   Edema 05/29/2014   Nasal colonization with  methicillin-resistant Staphylococcus aureus 01/22/2014   Varicose veins of bilateral lower extremities with other complications 12/11/2012   Allergic rhinitis 11/29/2009   MOTION SICKNESS 11/29/2009   Hyperlipidemia 08/31/2008   Anxiety state 08/31/2008   Primary hypertension 08/31/2008   PILONIDAL CYST 08/31/2008   HEADACHE 08/31/2008   URINARY INCONTINENCE 08/31/2008   NEPHROLITHIASIS, HX OF 08/31/2008   POSTNASAL DRIP SYNDROME 05/28/2006    PCP: Nelwyn Salisbury, MD   REFERRING PROVIDER: Joen Laura, MD   REFERRING DIAG: [phone] (ICD-10-CM) - Status post right partial knee replacement   THERAPY DIAG:  Stiffness of right knee, not elsewhere classified  Other abnormalities of gait and mobility  Muscle weakness (generalized)  Localized edema  Acute pain of right knee  RATIONALE FOR EVALUATION AND TREATMENT: Rehabilitation  ONSET DATE: 09/18/2022 - R unicompartmental knee replacement  NEXT MD VISIT: 12/12/2022    SUBJECTIVE:                                                                                                                                                                                                         SUBJECTIVE STATEMENT: Pt concerned about getting her knee to bend.  Patient verbalizing several questions related to positioning of R knee during sleeping and while sitting, weaning from [facility] frame for toilet transfers, focus of exercises, clarification of walking time frames for exercise and use of exercise equipment at gym.  PAIN: Are you having pain? Yes: NPRS scale: 1-2/10 Pain location: front of right knee and R shin  Pain description: burning, aching, sore  Aggravating factors: too much activity Relieving factors: exercise, warm showers, ice   PERTINENT HISTORY:  OA, anxiety, HTN, Raynaud's phenomenon, GERD, B CTS, DVT s/p COVID in 2021, syncope  PRECAUTIONS: None  RED FLAGS: None  WEIGHT BEARING RESTRICTIONS: No  FALLS:  Has  patient fallen in last 6 months? 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 Go back to work part-time."   OBJECTIVE: (objective measures completed at initial evaluation unless otherwise dated)  DIAGNOSTIC FINDINGS:  09/18/22 - DG R knee:  IMPRESSION: Medial compartment hemiarthroplasty without immediate postoperative complication.  PATIENT SURVEYS:  LEFS 13 / 80 = 16.3 %  11/02/22: 44 / 80 = 55.0 %  COGNITION: Overall cognitive status: Within functional limits for tasks assessed    SENSATION: WFL  EDEMA:  Circumferential: 44 cm at joint line, 53 cm (5 cm above joint line)  MUSCLE LENGTH: Hamstrings: Mild/mod tight R>L ITB: Mild/mod tight R>L Piriformis: Mild tight R>L Hip flexors: Mild tight R>L Quads: Mod tight R>L Heelcord: NT  POSTURE:  No Significant postural limitations  PALPATION: R knee swollen and slightly warm to touch with mild erythema present.  Decreased R patellar mobility in all directions.  LOWER EXTREMITY ROM:  Active ROM Right eval Left eval R 09/27/22 R 09/29/22 R 10/02/22 R 10/06/22 R 10/12/22 R 10/19/22 R 10/26/22 R 11/02/22 R 11/14/22 R 11/23/22  Knee flexion 77 127 85 91 100 105 104 100 105 107  107 112  Knee extension -15 LAQ +2 -10 LAQ -9 LAQ -8 LAQ -7 LAQ -4 LAQ 0 LAQ 0 LAQ 0 LAQ 0 0   Passive ROM Right eval R 09/27/22 R 10/02/22 R 10/12/22  Knee flexion 82 85    Knee extension -5 0 -1 0   LOWER EXTREMITY MMT:  MMT Right eval Left eval R 10/19/22 L 10/19/22 R 11/02/22 L 11/02/22  Hip flexion 4+ 5 4+ 5 4+ 5  Hip extension 4 4 4 4 4 4   Hip abduction 4-  4 4 4  4- 4  Hip adduction 4 4+ 4+ 4+ 4+ 4+  Hip internal rotation 4 4+ 4+ 4+ 4+ 5  Hip external rotation 4 4 4 4  4- 4  Knee flexion 4 5 4+ 5 4+ 5  Knee extension 4  5 4+ 5 4+ 5  Ankle dorsiflexion 4 4 4 4 4  4+  Ankle plantarflexion   4 (10 SLS HR) 4 (12 SLS HR)    Ankle inversion        Ankle eversion         (Blank rows = not tested)  FUNCTIONAL TESTS:  5 times sit to stand: 29.60 sec with weight shift to L LE Timed up and go (TUG): 23.79 sec with RW  GAIT: Distance walked: Clinic distances Assistive device utilized: Environmental consultant - 2 wheeled Level of assistance: Modified independence Gait pattern: step through pattern and decreased hip/knee flexion- Right Comments: Ambulates with stiff R knee but good heel strike on weight acceptance   TODAY'S TREATMENT:   11/23/22 THERAPEUTIC EXERCISE: to improve flexibility, strength and mobility.  Demonstration, verbal and tactile cues throughout for technique. Rec Bike - L3 x 6 min Seated hip ADD towel squeeze + R LAQ 2 x 10 Seated R heel slide with L foot overpressure for R knee flexion stretch 5 x 5" R knee step stretch for flexion ROM 5 x 5"  MANUAL THERAPY: To promote normalized muscle tension, improved flexibility, improved joint mobility, and increased ROM. Seated belt  mobs for R knee flexion  Hooklying tibiofemoral A/P mobs for R knee flexion Inferior & medial patellar glides to increase R knee flexion Gravity assisted stretch and contract/relax into R knee flexion with L LE suspended over PT's arm Review of R knee patellar mobs and incisional scar mobilization to facilitate increased R knee flexion ROM   THERAPEUTIC ACTIVITIES:  Knee ROM assessment Sit to stand transfers from low seat height w/o UE assist to simulate commode transfers w/o BSC frame Addressed pt's questions/concerns regarding progression of activity including weaning BSC frame from toilet, ongoing positional concerns in relation to sleeping position and sitting in  recliner, use of home and gym exercise equipment, and time frames for walking for exercise and use of exercise equipment.   11/21/22 THERAPEUTIC EXERCISE: to improve flexibility, strength and mobility.  Demonstration, verbal and tactile cues throughout for technique. Nustep L5x55min UE/LE Step ups 6' 2x10 RLE Lateral step ups 6' 2x10 RLE Step downs from 2' book 2x10 RLE STM to R quads  Patellar mobs all directions Game Ready vasopneumatic compression post session to R knee x 10 min, medium compression, 34 to reduce post-exercise pain and swelling/edema   11/16/22 TherEx Reclining bike L3x9 minutes for w/u and ROM, full rotations seat 7-->6 Tandem stance solid surface 3x30 seconds B SLS with one foot on metal edge of TM 3x30 seconds B  Lateral step ups with wt shift to L LE x20 8 inch box  Forward lunges on 8 inch box x12 cues for strength form instead of stretching knee on top of box like prior exercises   PATIENT EDUCATION:  Education details: progress with PT, ongoing PT POC, and continue with current HEP  Person educated: Patient Education method: Medical illustrator Education comprehension: verbalized understanding and needs further education  HOME EXERCISE PROGRAM:  Access Code: [email or web address] URL: [email or web address] Date: 11/16/2022 Prepared by: Nedra Hai  Exercises - Supine Heel Slide with Strap  - 2 x daily - 7 x weekly - 2 sets - 10 reps - 3 sec hold - Heel Toe Raises with Counter Support  - 2 x daily - 7 x weekly - 2 sets - 10 reps - 3 sec hold - Standing Knee Flexion  - 2 x daily - 7 x weekly - 2 sets - 10 reps - 3 sec hold - Backward Weight Shift and Opposite Arm Raise with Walker  - 1 x daily - 7 x weekly - 2 sets - 10 reps - [place]  - 1 x daily - 7 x weekly - 2 sets - 10 reps - Sitting Knee Extension with Resistance  - 1 x daily - 7 x weekly - 2 sets - 10 reps - Seated Hamstring Curl with Anchored Resistance  - 1 x daily - 7 x  weekly - 2 sets - 10 reps - Long Sitting 4 Way Patellar Glide  - 2-3 x daily - 7 x weekly - 2 sets - 10 reps - Standing Hip Flexion with Anchored Resistance and Chair Support  - 1 x daily - 3-4 x weekly - 2 sets - 10 reps - 3 sec hold - Standing Hip Adduction with Anchored Resistance  - 1 x daily - 3-4 x weekly - 2 sets - 10 reps - 3 sec hold - Standing Hip Extension with Anchored Resistance  - 1 x daily - 3-4 x weekly - 2 sets - 10 reps - 3 sec hold - Standing Hip Abduction with Anchored Resistance  - 1 x daily -  3-4 x weekly - 2 sets - 10 reps - 3 sec hold - Side Stepping with Resistance at Ankles  - 1 x daily - 3 x weekly - 2 sets - 10 reps - Band Walks  - 1 x daily - 3 x weekly - 2 sets - 10 reps - Standing Knee Flexion Stretch on Step  - 2 x daily - 7 x weekly - 2 sets - 10 reps - 5 sec hold - Hip Hiking on Step  - 1 x daily - 3-4 x weekly - 1-2 sets - 10 reps - Step Up (Mirrored)  - 1 x daily - 3-4 x weekly - 1 sets - 10 reps - Tandem Stance in Corner  - 1 x daily - 7 x weekly - 1 sets - 6 reps - 30 seconds  hold - Single Leg Stance  - 1 x daily - 7 x weekly - 1 sets - 6 reps - 30 seconds  hold  Patient Education - Rubbing with Different Textures   ASSESSMENT:  CLINICAL IMPRESSION: Iliyana expressing several questions regarding progression of activity (weaning BSC frame from toilet, ongoing positional concerns in relation to sleeping position and sitting in recliner, use of home and gym exercise equipment, and time frames for walking for exercise and use of exercise equipment) - pt addressing all concerns and answering all questions.  R knee flexion ROM progression continues to have stalled in recent measurements, although knee extension remains at 0, therefore majority of manual therapy and therapeutic exercises targeting facilitation of increased R knee flexion ROM.  Able to increased R knee flexion from 108 at start of visit to 112 at end of visit.  We reviewed most relevant HEP  stretches and exercises to maintain gains in ROM.  She continue to note clicking sensation in R knee with some TTP over superior-medial patella.  Assessment of patellar and scar mobility revealing ongoing restrictions therefore reviewed both patellar mobilization and scar tissue massage to facilitate improved knee mobility.  Patient also noted to demonstrate decreased R VMO activation with quad set, therefore provided cues on how to increase VMO activation with HEP exercises.  She is nearing the end of her current POC, therefore will plan to initiate goal assessment and finalize HEP review in preparation for transition to HEP at end of current episode.  OBJECTIVE IMPAIRMENTS: Abnormal gait, decreased activity tolerance, decreased balance, decreased endurance, decreased knowledge of condition, decreased knowledge of use of DME, decreased mobility, difficulty walking, decreased ROM, decreased strength, decreased safety awareness, increased edema, increased fascial restrictions, impaired perceived functional ability, increased muscle spasms, impaired flexibility, and pain.   ACTIVITY LIMITATIONS: sitting, standing, squatting, sleeping, stairs, transfers, bed mobility, bathing, toileting, dressing, locomotion level, and caring for others  PARTICIPATION LIMITATIONS: meal prep, cleaning, laundry, driving, shopping, community activity, and yard work  PERSONAL FACTORS: Fitness, Past/current experiences, Time since onset of injury/illness/exacerbation, and 3+ comorbidities: OA, anxiety, HTN, Raynaud's phenomenon, GERD, B CTS, DVT s/p COVID in 2021, syncope  are also affecting patient's functional outcome.   REHAB POTENTIAL: Excellent  CLINICAL DECISION MAKING: Stable/uncomplicated  EVALUATION COMPLEXITY: Low   GOALS: Goals reviewed with patient? Yes  SHORT TERM GOALS: Target date: 10/12/2022  Patient will be independent with initial HEP. Baseline: pt performing hospital issued HEP Goal status: MET   10/02/22  2.  Patient will demonstrate improved R knee AROM to >/= 5-90 deg to allow for normal gait pattern. Baseline: R knee AROM -15-77, PROM -5-82 Goal status: MET  10/02/22 -  R knee AROM -1-100, lacking 8 extension in LAQ  LONG TERM GOALS: Target date: 11/02/2022; extended to 11/30/2022  Patient will be independent with advanced/ongoing HEP to improve outcomes and carryover.  Baseline:  Goal status: PARTIALLY MET  10/19/22 - Met for current HEP  2.  Patient will report at least 75% improvement in R knee pain to improve QOL. Baseline: 5-6/10 Goal status: MET  11/02/22 - 90-95% improvement in pain   3.  Patient will demonstrate improved R knee AROM to >/= -2-120 deg to allow for normal gait and stair mechanics. Baseline: R knee AROM -15-77, PROM -5-82 Goal status: IN PROGRESS  11/23/22 - R knee AROM 0-112, with full extension to 0 in LAQ  4.  Patient will demonstrate improved B LE strength to >/= 4+/5 for improved stability and ease of mobility. Baseline: refer to above LE MMT table Goal status: IN PROGRESS 11/02/22 - strength improving but R LE remains weaker than L (refer to above LE MMT table)  5.  Patient will be able to ambulate 600' with or w/o LRAD and normal gait pattern without increased pain to access community.  Baseline: antalgic gait with RW Goal status: IN PROGRESS  11/02/22 - 300' with mild L Trendelenburg gait pattern noted without AD  6. Patient will be able to ascend/descend stairs with 1 HR and reciprocal step pattern safely to access home and community.  Baseline: NT Goal status: IN PROGRESS  11/02/22 - increased effort advancing L LE to next step during R stance phase on ascent due to continued quad and glute medius weakness; decreased R eccentric quad control on descent leading to compensatory hip drop  7.  Patient will report >/= 30/80 on LEFS to demonstrate improved functional ability. Baseline: 13 / 80 = 16.3 % Goal status: MET  11/02/22 - 44 / 80 =  55.0 %  8.  Patient will improve 5x STS time to </= 15 seconds with even weight shift to demonstrate improved functional strength and transfer efficiency . Baseline: 29.60 sec with weight shift to L LE Goal status: MET  11/02/22 - 14.91 sec  9.  Patient will demonstrate decreased TUG time to </= 13.5 sec with or w/o LRAD to decrease risk for falls with transitional mobility Baseline: 23.79 sec with RW Goal status: MET  11/02/22 - 9.78 sec   PLAN:  PT FREQUENCY: 2x/week   PT DURATION: 4 weeks  PLANNED INTERVENTIONS: 97164- PT Re-evaluation, 97110-Therapeutic exercises, 97530- Therapeutic activity, 97112- Neuromuscular re-education, 97535- Self Care, 82956- Manual therapy, 97116- Gait training, 97014- Electrical stimulation (unattended), 97016- Vasopneumatic device, 97035- Ultrasound, 21308- Ionotophoresis 4mg /ml Dexamethasone, Patient/Family education, Balance training, Stair training, Taping, Dry Needling, Joint mobilization, DME instructions, Cryotherapy, Moist heat, Therapeutic exercises, Therapeutic activity, Neuromuscular re-education, Gait training, and Self Care  PLAN FOR NEXT SESSION: review & consolidate HEP - add quad stretching and eccentric quad (VMO emphasis) & glute med strengthening; initiate LTG assessment; progress R knee ROM (emphasis on flexion ROM) and B LE strengthening - progress standing activities/functional strengthening; gait training to normalize gait pattern w/o AD; MT for increased R knee ROM and edema management; balance.   Marry Guan, PT  11/23/22 12:51 PM     Date of referral: 08/10/22 Referring provider: Joen Laura, MD Referring diagnosis? M57.846 (ICD-10-CM) - Status post right partial knee replacement  Treatment diagnosis? (if different than referring diagnosis)  Stiffness of right knee, not elsewhere classified  Other abnormalities of gait and mobility  Muscle weakness (generalized)  Localized  edema  Acute pain of right  knee  What was this (referring dx) caused by? Surgery (Type: R unicondylar knee replacement) and Arthritis  Nature of Condition: Initial Onset (within last 3 months)   Laterality: Rt  Current Functional Measure Score: LEFS 44 / 80 = 55.0 %  Objective measurements identify impairments when they are compared to normal values, the uninvolved extremity, and prior level of function.  [x]  Yes  []  No  Objective assessment of functional ability: Moderate functional limitations   Briefly describe symptoms: Ongoing deficits include intermittent R knee pain, increased edema in R LE, limited and painful endrange R knee flexion ROM, R>L LE weakness and impaired ADLs, gait and mobility, especially stair negotiation.  How did symptoms start: OA leading to R unicondylar knee replacement on 09/18/22  Average pain intensity:  Last 24 hours: 0-1/10  Past week: 1/10  How often does the pt experience symptoms? Intermittently  How much have the symptoms interfered with usual daily activities? Moderately  How has condition changed since care began at this facility? Better  In general, how is the patients overall health? Very Good

## 2022-11-28 ENCOUNTER — Ambulatory Visit: Payer: Medicare Other

## 2022-11-28 DIAGNOSIS — M25561 Pain in right knee: Secondary | ICD-10-CM | POA: Diagnosis not present

## 2022-11-28 DIAGNOSIS — R2689 Other abnormalities of gait and mobility: Secondary | ICD-10-CM | POA: Diagnosis not present

## 2022-11-28 DIAGNOSIS — R600 Localized edema: Secondary | ICD-10-CM

## 2022-11-28 DIAGNOSIS — M25661 Stiffness of right knee, not elsewhere classified: Secondary | ICD-10-CM

## 2022-11-28 DIAGNOSIS — M6281 Muscle weakness (generalized): Secondary | ICD-10-CM | POA: Diagnosis not present

## 2022-11-28 NOTE — Therapy (Signed)
OUTPATIENT PHYSICAL THERAPY TREATMENT   Patient Name: Megan Crane MRN: [id number] DOB:05-Mar-1950, 72 y.o., female Today's Date: 11/28/2022   END OF SESSION:  PT End of Session - 11/28/22 1038     Visit Number 21    Date for PT Re-Evaluation 11/30/22    Authorization Type [employer] Medicare    Authorization Time Period 11/07/22 - 12/05/22    Authorization - Visit Number 6    Authorization - Number of Visits 8    Progress Note Due on Visit 25    PT Start Time 1017    PT Stop Time 1117    PT Time Calculation (min) 60 min    Activity Tolerance Patient tolerated treatment well    Behavior During Therapy [facility] for tasks assessed/performed;Anxious                               Past Medical History:  Diagnosis Date   Anxiety disorder    hx vasovagal responses   Arthritis    Cancer (HCC)    Deep vein thrombosis (DVT) (HCC)    hx of in left left after covid in 2021   Headache(784.0)    History of kidney stones    Hydronephrosis, right    Hyperlipidemia    Hypertension    Nephrolithiasis    Pneumonia    Positive nasal culture for methicillin resistant Staphylococcus aureus    Right ureteral stone    Thrombophlebitis of leg, left, superficial 11/17/2020   Urinary incontinence    sees Dr. McDiarmid    Vasovagal syncope 08/02/2022   Past Surgical History:  Procedure Laterality Date   BLADDER REPAIR     COLONOSCOPY  12/28/2015   per Dr. Adela Lank, serrated polyps and diverticula, repeat in 3 yrs    CYSTOSCOPY  06/09/2011   Procedure: CYSTOSCOPY;  Surgeon: Martina Sinner, MD;  Location: [facility];  Service: Urology;  Laterality: N/A;   CYSTOSCOPY W/ URETERAL STENT PLACEMENT  02/03/2014   Procedure: CYSTOSCOPY WITH  RIGHT RETROGRADE PYELOGRAM/ RIGHT URETERAL STENT PLACEMENT;  Surgeon: Anner Crete, MD;  Location: [facility];  Service: Urology;;   CYSTOSCOPY WITH BIOPSY  02/03/2014   Procedure: CYSTOSCOPY WITH BIOPSY;  Surgeon: Anner Crete, MD;  Location: [facility];  Service: Urology;;   PARTIAL KNEE ARTHROPLASTY Right 09/18/2022   Procedure: UNICOMPARTMENTAL KNEE;  Surgeon: Joen Laura, MD;  Location: [facility];  Service: Orthopedics;  Laterality: Right;   PILONIDAL CYST EXCISION  1974   RECTOCELE REPAIR  06/09/2011   Procedure: POSTERIOR REPAIR (RECTOCELE);  Surgeon: Martina Sinner, MD;  Location: [facility];  Service: Urology;  Laterality: N/A;  Xenform graft 6x10   RIGHT URETEROSCOPIC STONE EXTRACTION / STENT PLACEMENT  03/13/2000   URETEROSCOPY Right 02/03/2014   Procedure: URETEROSCOPY WITH MANAGEMENT OF URETERAL STRICTURE;  Surgeon: Anner Crete, MD;  Location: [facility];  Service: Urology;  Laterality: Right;   VAGINAL HYSTERECTOMY  06/09/2011   Procedure: HYSTERECTOMY VAGINAL;  Surgeon: Mickel Baas, MD;  Location: [facility];  Service: Gynecology;  Laterality: N/A;   VAGINAL PROLAPSE REPAIR  06/09/2011   Procedure: VAGINAL VAULT SUSPENSION;  Surgeon: Martina Sinner, MD;  Location: [facility];  Service: Urology;  Laterality: N/A;   VEIN LIGATION AND STRIPPING     LEFT LEG   Patient Active Problem List   Diagnosis Date Noted   S/P right unicompartmental knee replacement  09/18/2022   IC (interstitial cystitis) 09/04/2022   Pelvic pressure in female 08/22/2022   Visit for pre-operative examination 08/07/2022   Vasovagal syncope 08/02/2022   Nasal dryness 06/30/2022   Precancerous skin lesion 06/30/2022   Bilateral carpal tunnel syndrome 03/02/2022   Swelling of both hands 03/02/2022   Atypical chest pain 03/02/2022   OAB (overactive bladder) 11/23/2021   Corn of foot 11/23/2021   Atrophic vaginitis 11/23/2021   COVID-19 virus infection 11/17/2020   Raynaud's phenomenon without gangrene 11/17/2020   Primary osteoarthritis involving multiple joints 11/24/2019   GERD (gastroesophageal reflux disease) 12/26/2016   Edema 05/29/2014   Nasal colonization with  methicillin-resistant Staphylococcus aureus 01/22/2014   Varicose veins of bilateral lower extremities with other complications 12/11/2012   Allergic rhinitis 11/29/2009   MOTION SICKNESS 11/29/2009   Hyperlipidemia 08/31/2008   Anxiety state 08/31/2008   Primary hypertension 08/31/2008   PILONIDAL CYST 08/31/2008   HEADACHE 08/31/2008   URINARY INCONTINENCE 08/31/2008   NEPHROLITHIASIS, HX OF 08/31/2008   POSTNASAL DRIP SYNDROME 05/28/2006    PCP: Nelwyn Salisbury, MD   REFERRING PROVIDER: Joen Laura, MD   REFERRING DIAG: [phone] (ICD-10-CM) - Status post right partial knee replacement   THERAPY DIAG:  Stiffness of right knee, not elsewhere classified  Other abnormalities of gait and mobility  Muscle weakness (generalized)  Localized edema  Acute pain of right knee  RATIONALE FOR EVALUATION AND TREATMENT: Rehabilitation  ONSET DATE: 09/18/2022 - R unicompartmental knee replacement  NEXT MD VISIT: 12/12/2022    SUBJECTIVE:                                                                                                                                                                                                         SUBJECTIVE STATEMENT: Pt concerned about clicking in her patella, anxious about whether or not she ruined anything.   PAIN: Are you having pain? Yes: NPRS scale: 1-2/10 Pain location: front of right knee and R shin  Pain description: burning, aching, sore  Aggravating factors: too much activity Relieving factors: exercise, warm showers, ice   PERTINENT HISTORY:  OA, anxiety, HTN, Raynaud's phenomenon, GERD, B CTS, DVT s/p COVID in 2021, syncope  PRECAUTIONS: None  RED FLAGS: None  WEIGHT BEARING RESTRICTIONS: No  FALLS:  Has patient fallen in last 6 months? 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 Go back to work part-time."   OBJECTIVE: (objective measures completed at initial evaluation unless otherwise dated)  DIAGNOSTIC FINDINGS:  09/18/22 - DG R knee:  IMPRESSION: Medial compartment hemiarthroplasty without immediate postoperative complication.  PATIENT SURVEYS:  LEFS 13 / 80 = 16.3 %  11/02/22: 44 / 80 = 55.0 %  COGNITION: Overall cognitive status: Within functional limits for tasks assessed    SENSATION: WFL  EDEMA:  Circumferential: 44 cm at joint line, 53 cm (5 cm above joint line)  MUSCLE LENGTH: Hamstrings: Mild/mod tight R>L ITB: Mild/mod tight R>L Piriformis: Mild tight R>L Hip flexors: Mild tight R>L Quads: Mod tight R>L Heelcord: NT  POSTURE:  No Significant postural limitations  PALPATION: R knee swollen and slightly warm to touch with mild erythema present.  Decreased R patellar mobility in all directions.  LOWER EXTREMITY ROM:  Active ROM Right eval Left eval R 09/27/22 R 09/29/22 R 10/02/22 R 10/06/22 R 10/12/22 R 10/19/22 R 10/26/22 R 11/02/22 R 11/14/22 R 11/23/22  Knee flexion 77 127 85 91 100 105 104 100 105 107  107 112  Knee extension -15 LAQ +2 -10 LAQ -9 LAQ -8 LAQ -7 LAQ -4 LAQ 0 LAQ 0 LAQ 0 LAQ 0 0   Passive ROM Right eval R 09/27/22 R 10/02/22 R 10/12/22  Knee flexion 82 85    Knee extension -5 0 -1 0   LOWER EXTREMITY MMT:  MMT Right eval Left eval R 10/19/22 L 10/19/22 R 11/02/22 L 11/02/22  Hip flexion 4+ 5 4+ 5 4+ 5  Hip extension 4 4 4 4 4 4   Hip abduction 4- 4 4 4  4- 4  Hip adduction 4 4+ 4+ 4+ 4+ 4+  Hip internal rotation 4 4+ 4+ 4+ 4+ 5  Hip external rotation 4 4 4 4  4- 4  Knee flexion 4 5 4+ 5 4+ 5  Knee extension 4  5 4+ 5 4+ 5  Ankle dorsiflexion 4 4 4 4 4  4+   Ankle plantarflexion   4 (10 SLS HR) 4 (12 SLS HR)    Ankle inversion        Ankle eversion         (Blank rows = not tested)  FUNCTIONAL TESTS:  5 times sit to stand: 29.60 sec with weight shift to L LE Timed up and go (TUG): 23.79 sec with RW  GAIT: Distance walked: Clinic distances Assistive device utilized: Environmental consultant - 2 wheeled Level of assistance: Modified independence Gait pattern: step through pattern and decreased hip/knee flexion- Right Comments: Ambulates with stiff R knee but good heel strike on weight acceptance   TODAY'S TREATMENT: 11/28/22 THERAPEUTIC EXERCISE: to improve flexibility, strength and mobility.  Demonstration, verbal and tactile cues throughout for technique. Rec Bike - L3 x 6 min Seated hip ADD towel squeeze + R LAQ 2 x 10 Seated R heel slide with L foot overpressure for R knee flexion stretch 5 x 5" 4 way hip with GTB anchored to mat table x 10 each Standing lunge stretch on 8' step 10x5"  Game Ready vasopneumatic compression post session to R knee x 10 min, medium compression, 34 to reduce post-exercise pain and swelling/edema  11/23/22 THERAPEUTIC EXERCISE: to improve flexibility, strength and mobility.  Demonstration, verbal and tactile cues throughout for technique. Rec Bike - L3 x  6 min Seated hip ADD towel squeeze + R LAQ 2 x 10 Seated R heel slide with L foot overpressure for R knee flexion stretch 5 x 5" R knee step stretch for flexion ROM 5 x 5"  MANUAL THERAPY: To promote normalized muscle tension, improved flexibility, improved joint mobility, and increased ROM. Seated belt mobs for R knee flexion  Hooklying tibiofemoral A/P mobs for R knee flexion Inferior & medial patellar glides to increase R knee flexion Gravity assisted stretch and contract/relax into R knee flexion with L LE suspended over PT's arm Review of R knee patellar mobs and incisional scar mobilization to facilitate increased R knee flexion ROM   THERAPEUTIC ACTIVITIES:   Knee ROM assessment Sit to stand transfers from low seat height w/o UE assist to simulate commode transfers w/o BSC frame Addressed pt's questions/concerns regarding progression of activity including weaning BSC frame from toilet, ongoing positional concerns in relation to sleeping position and sitting in recliner, use of home and gym exercise equipment, and time frames for walking for exercise and use of exercise equipment.   11/21/22 THERAPEUTIC EXERCISE: to improve flexibility, strength and mobility.  Demonstration, verbal and tactile cues throughout for technique. Nustep L5x87min UE/LE Step ups 6' 2x10 RLE Lateral step ups 6' 2x10 RLE Step downs from 2' book 2x10 RLE STM to R quads  Patellar mobs all directions Game Ready vasopneumatic compression post session to R knee x 10 min, medium compression, 34 to reduce post-exercise pain and swelling/edema   11/16/22 TherEx Reclining bike L3x9 minutes for w/u and ROM, full rotations seat 7-->6 Tandem stance solid surface 3x30 seconds B SLS with one foot on metal edge of TM 3x30 seconds B  Lateral step ups with wt shift to L LE x20 8 inch box  Forward lunges on 8 inch box x12 cues for strength form instead of stretching knee on top of box like prior exercises   PATIENT EDUCATION:  Education details: progress with PT, ongoing PT POC, and continue with current HEP  Person educated: Patient Education method: Medical illustrator Education comprehension: verbalized understanding and needs further education  HOME EXERCISE PROGRAM:  Access Code: [email or web address] URL: [email or web address] Date: 11/16/2022 Prepared by: Nedra Hai  Exercises - Supine Heel Slide with Strap  - 2 x daily - 7 x weekly - 2 sets - 10 reps - 3 sec hold - Heel Toe Raises with Counter Support  - 2 x daily - 7 x weekly - 2 sets - 10 reps - 3 sec hold - Standing Knee Flexion  - 2 x daily - 7 x weekly - 2 sets - 10 reps - 3 sec hold - Backward  Weight Shift and Opposite Arm Raise with Walker  - 1 x daily - 7 x weekly - 2 sets - 10 reps - [place]  - 1 x daily - 7 x weekly - 2 sets - 10 reps - Sitting Knee Extension with Resistance  - 1 x daily - 7 x weekly - 2 sets - 10 reps - Seated Hamstring Curl with Anchored Resistance  - 1 x daily - 7 x weekly - 2 sets - 10 reps - Long Sitting 4 Way Patellar Glide  - 2-3 x daily - 7 x weekly - 2 sets - 10 reps - Standing Hip Flexion with Anchored Resistance and Chair Support  - 1 x daily - 3-4 x weekly - 2 sets - 10 reps - 3 sec hold - Standing Hip Adduction with Anchored Resistance  -  1 x daily - 3-4 x weekly - 2 sets - 10 reps - 3 sec hold - Standing Hip Extension with Anchored Resistance  - 1 x daily - 3-4 x weekly - 2 sets - 10 reps - 3 sec hold - Standing Hip Abduction with Anchored Resistance  - 1 x daily - 3-4 x weekly - 2 sets - 10 reps - 3 sec hold - Side Stepping with Resistance at Ankles  - 1 x daily - 3 x weekly - 2 sets - 10 reps - Band Walks  - 1 x daily - 3 x weekly - 2 sets - 10 reps - Standing Knee Flexion Stretch on Step  - 2 x daily - 7 x weekly - 2 sets - 10 reps - 5 sec hold - Hip Hiking on Step  - 1 x daily - 3-4 x weekly - 1-2 sets - 10 reps - Step Up (Mirrored)  - 1 x daily - 3-4 x weekly - 1 sets - 10 reps - Tandem Stance in Corner  - 1 x daily - 7 x weekly - 1 sets - 6 reps - 30 seconds  hold - Single Leg Stance  - 1 x daily - 7 x weekly - 1 sets - 6 reps - 30 seconds  hold  Patient Education - Rubbing with Different Textures   ASSESSMENT:  CLINICAL IMPRESSION: Pt responded well to treatment. She was concerned about her patellar tracking but was reassured that there was no issue but imbalances on medial and lateral musculature. Reviewed her HEP, going over exercises with the most concern. Min cues required. GR post session to address swelling. Pt has one more visit within POC coming up and seems ready to transition to HEP.  OBJECTIVE IMPAIRMENTS: Abnormal gait,  decreased activity tolerance, decreased balance, decreased endurance, decreased knowledge of condition, decreased knowledge of use of DME, decreased mobility, difficulty walking, decreased ROM, decreased strength, decreased safety awareness, increased edema, increased fascial restrictions, impaired perceived functional ability, increased muscle spasms, impaired flexibility, and pain.   ACTIVITY LIMITATIONS: sitting, standing, squatting, sleeping, stairs, transfers, bed mobility, bathing, toileting, dressing, locomotion level, and caring for others  PARTICIPATION LIMITATIONS: meal prep, cleaning, laundry, driving, shopping, community activity, and yard work  PERSONAL FACTORS: Fitness, Past/current experiences, Time since onset of injury/illness/exacerbation, and 3+ comorbidities: OA, anxiety, HTN, Raynaud's phenomenon, GERD, B CTS, DVT s/p COVID in 2021, syncope  are also affecting patient's functional outcome.   REHAB POTENTIAL: Excellent  CLINICAL DECISION MAKING: Stable/uncomplicated  EVALUATION COMPLEXITY: Low   GOALS: Goals reviewed with patient? Yes  SHORT TERM GOALS: Target date: 10/12/2022  Patient will be independent with initial HEP. Baseline: pt performing hospital issued HEP Goal status: MET  10/02/22  2.  Patient will demonstrate improved R knee AROM to >/= 5-90 deg to allow for normal gait pattern. Baseline: R knee AROM -15-77, PROM -5-82 Goal status: MET  10/02/22 - R knee AROM -1-100, lacking 8 extension in LAQ  LONG TERM GOALS: Target date: 11/02/2022; extended to 11/30/2022  Patient will be independent with advanced/ongoing HEP to improve outcomes and carryover.  Baseline:  Goal status: PARTIALLY MET  10/19/22 - Met for current HEP  2.  Patient will report at least 75% improvement in R knee pain to improve QOL. Baseline: 5-6/10 Goal status: MET  11/02/22 - 90-95% improvement in pain   3.  Patient will demonstrate improved R knee AROM to >/= -2-120 deg to allow  for normal gait and stair mechanics. Baseline:  R knee AROM -15-77, PROM -5-82 Goal status: IN PROGRESS  11/23/22 - R knee AROM 0-112, with full extension to 0 in LAQ  4.  Patient will demonstrate improved B LE strength to >/= 4+/5 for improved stability and ease of mobility. Baseline: refer to above LE MMT table Goal status: IN PROGRESS 11/02/22 - strength improving but R LE remains weaker than L (refer to above LE MMT table)  5.  Patient will be able to ambulate 600' with or w/o LRAD and normal gait pattern without increased pain to access community.  Baseline: antalgic gait with RW Goal status: IN PROGRESS  11/02/22 - 300' with mild L Trendelenburg gait pattern noted without AD  6. Patient will be able to ascend/descend stairs with 1 HR and reciprocal step pattern safely to access home and community.  Baseline: NT Goal status: IN PROGRESS  11/02/22 - increased effort advancing L LE to next step during R stance phase on ascent due to continued quad and glute medius weakness; decreased R eccentric quad control on descent leading to compensatory hip drop  7.  Patient will report >/= 30/80 on LEFS to demonstrate improved functional ability. Baseline: 13 / 80 = 16.3 % Goal status: MET  11/02/22 - 44 / 80 = 55.0 %  8.  Patient will improve 5x STS time to </= 15 seconds with even weight shift to demonstrate improved functional strength and transfer efficiency . Baseline: 29.60 sec with weight shift to L LE Goal status: MET  11/02/22 - 14.91 sec  9.  Patient will demonstrate decreased TUG time to </= 13.5 sec with or w/o LRAD to decrease risk for falls with transitional mobility Baseline: 23.79 sec with RW Goal status: MET  11/02/22 - 9.78 sec   PLAN:  PT FREQUENCY: 2x/week   PT DURATION: 4 weeks  PLANNED INTERVENTIONS: 97164- PT Re-evaluation, 97110-Therapeutic exercises, 97530- Therapeutic activity, 97112- Neuromuscular re-education, 97535- Self Care, 59563- Manual therapy, 97116-  Gait training, 97014- Electrical stimulation (unattended), 97016- Vasopneumatic device, 97035- Ultrasound, 87564- Ionotophoresis 4mg /ml Dexamethasone, Patient/Family education, Balance training, Stair training, Taping, Dry Needling, Joint mobilization, DME instructions, Cryotherapy, Moist heat, Therapeutic exercises, Therapeutic activity, Neuromuscular re-education, Gait training, and Self Care  PLAN FOR NEXT SESSION: prepare for D/C; review & consolidate HEP - add quad stretching and eccentric quad (VMO emphasis) & glute med strengthening; initiate LTG assessment; progress R knee ROM (emphasis on flexion ROM) and B LE strengthening - progress standing activities/functional strengthening; gait training to normalize gait pattern w/o AD; MT for increased R knee ROM and edema management; balance.  Darleene Cleaver, PTA  11/28/22 12:01 PM     Date of referral: 08/10/22 Referring provider: Joen Laura, MD Referring diagnosis? P32.951 (ICD-10-CM) - Status post right partial knee replacement  Treatment diagnosis? (if different than referring diagnosis)  Stiffness of right knee, not elsewhere classified  Other abnormalities of gait and mobility  Muscle weakness (generalized)  Localized edema  Acute pain of right knee  What was this (referring dx) caused by? Surgery (Type: R unicondylar knee replacement) and Arthritis  Nature of Condition: Initial Onset (within last 3 months)   Laterality: Rt  Current Functional Measure Score: LEFS 44 / 80 = 55.0 %  Objective measurements identify impairments when they are compared to normal values, the uninvolved extremity, and prior level of function.  [x]  Yes  []  No  Objective assessment of functional ability: Moderate functional limitations   Briefly describe symptoms: Ongoing deficits include intermittent R knee pain, increased  edema in R LE, limited and painful endrange R knee flexion ROM, R>L LE weakness and impaired ADLs, gait and mobility,  especially stair negotiation.  How did symptoms start: OA leading to R unicondylar knee replacement on 09/18/22  Average pain intensity:  Last 24 hours: 0-1/10  Past week: 1/10  How often does the pt experience symptoms? Intermittently  How much have the symptoms interfered with usual daily activities? Moderately  How has condition changed since care began at this facility? Better  In general, how is the patients overall health? Very Good

## 2022-11-29 DIAGNOSIS — R300 Dysuria: Secondary | ICD-10-CM | POA: Diagnosis not present

## 2022-11-30 ENCOUNTER — Encounter: Payer: Self-pay | Admitting: Physical Therapy

## 2022-11-30 ENCOUNTER — Ambulatory Visit: Payer: Medicare Other | Admitting: Physical Therapy

## 2022-11-30 DIAGNOSIS — M25661 Stiffness of right knee, not elsewhere classified: Secondary | ICD-10-CM

## 2022-11-30 DIAGNOSIS — R2689 Other abnormalities of gait and mobility: Secondary | ICD-10-CM | POA: Diagnosis not present

## 2022-11-30 DIAGNOSIS — R600 Localized edema: Secondary | ICD-10-CM | POA: Diagnosis not present

## 2022-11-30 DIAGNOSIS — M25561 Pain in right knee: Secondary | ICD-10-CM

## 2022-11-30 DIAGNOSIS — M6281 Muscle weakness (generalized): Secondary | ICD-10-CM

## 2022-11-30 NOTE — Therapy (Signed)
OUTPATIENT PHYSICAL THERAPY TREATMENT / DISCHARGE SUMMARY   Patient Name: Megan Crane MRN: [id number] DOB:30-Apr-1950, 72 y.o., female Today's Date: 11/30/2022   END OF SESSION:  PT End of Session - 11/30/22 1101     Visit Number 22    Date for PT Re-Evaluation 11/30/22    Authorization Type [employer] Medicare    Authorization Time Period 11/07/22 - 12/05/22    Authorization - Visit Number 7    Authorization - Number of Visits 8    Progress Note Due on Visit 25    PT Start Time 1101    PT Stop Time 1155    PT Time Calculation (min) 54 min    Activity Tolerance Patient tolerated treatment well    Behavior During Therapy WFL for tasks assessed/performed                               Past Medical History:  Diagnosis Date   Anxiety disorder    hx vasovagal responses   Arthritis    Cancer (HCC)    Deep vein thrombosis (DVT) (HCC)    hx of in left left after covid in 2021   Headache(784.0)    History of kidney stones    Hydronephrosis, right    Hyperlipidemia    Hypertension    Nephrolithiasis    Pneumonia    Positive nasal culture for methicillin resistant Staphylococcus aureus    Right ureteral stone    Thrombophlebitis of leg, left, superficial 11/17/2020   Urinary incontinence    sees Dr. McDiarmid    Vasovagal syncope 08/02/2022   Past Surgical History:  Procedure Laterality Date   BLADDER REPAIR     COLONOSCOPY  12/28/2015   per Dr. Adela Lank, serrated polyps and diverticula, repeat in 3 yrs    CYSTOSCOPY  06/09/2011   Procedure: CYSTOSCOPY;  Surgeon: Martina Sinner, MD;  Location: [facility];  Service: Urology;  Laterality: N/A;   CYSTOSCOPY W/ URETERAL STENT PLACEMENT  02/03/2014   Procedure: CYSTOSCOPY WITH  RIGHT RETROGRADE PYELOGRAM/ RIGHT URETERAL STENT PLACEMENT;  Surgeon: Anner Crete, MD;  Location: [facility];  Service: Urology;;   CYSTOSCOPY WITH BIOPSY  02/03/2014   Procedure: CYSTOSCOPY WITH BIOPSY;  Surgeon:  Anner Crete, MD;  Location: [facility];  Service: Urology;;   PARTIAL KNEE ARTHROPLASTY Right 09/18/2022   Procedure: UNICOMPARTMENTAL KNEE;  Surgeon: Joen Laura, MD;  Location: [facility];  Service: Orthopedics;  Laterality: Right;   PILONIDAL CYST EXCISION  1974   RECTOCELE REPAIR  06/09/2011   Procedure: POSTERIOR REPAIR (RECTOCELE);  Surgeon: Martina Sinner, MD;  Location: [facility];  Service: Urology;  Laterality: N/A;  Xenform graft 6x10   RIGHT URETEROSCOPIC STONE EXTRACTION / STENT PLACEMENT  03/13/2000   URETEROSCOPY Right 02/03/2014   Procedure: URETEROSCOPY WITH MANAGEMENT OF URETERAL STRICTURE;  Surgeon: Anner Crete, MD;  Location: [facility];  Service: Urology;  Laterality: Right;   VAGINAL HYSTERECTOMY  06/09/2011   Procedure: HYSTERECTOMY VAGINAL;  Surgeon: Mickel Baas, MD;  Location: [facility];  Service: Gynecology;  Laterality: N/A;   VAGINAL PROLAPSE REPAIR  06/09/2011   Procedure: VAGINAL VAULT SUSPENSION;  Surgeon: Martina Sinner, MD;  Location: [facility];  Service: Urology;  Laterality: N/A;   VEIN LIGATION AND STRIPPING     LEFT LEG   Patient Active Problem List   Diagnosis Date Noted   S/P right  unicompartmental knee replacement 09/18/2022   IC (interstitial cystitis) 09/04/2022   Pelvic pressure in female 08/22/2022   Visit for pre-operative examination 08/07/2022   Vasovagal syncope 08/02/2022   Nasal dryness 06/30/2022   Precancerous skin lesion 06/30/2022   Bilateral carpal tunnel syndrome 03/02/2022   Swelling of both hands 03/02/2022   Atypical chest pain 03/02/2022   OAB (overactive bladder) 11/23/2021   Corn of foot 11/23/2021   Atrophic vaginitis 11/23/2021   COVID-19 virus infection 11/17/2020   Raynaud's phenomenon without gangrene 11/17/2020   Primary osteoarthritis involving multiple joints 11/24/2019   GERD (gastroesophageal reflux disease) 12/26/2016   Edema 05/29/2014   Nasal colonization with  methicillin-resistant Staphylococcus aureus 01/22/2014   Varicose veins of bilateral lower extremities with other complications 12/11/2012   Allergic rhinitis 11/29/2009   MOTION SICKNESS 11/29/2009   Hyperlipidemia 08/31/2008   Anxiety state 08/31/2008   Primary hypertension 08/31/2008   PILONIDAL CYST 08/31/2008   HEADACHE 08/31/2008   URINARY INCONTINENCE 08/31/2008   NEPHROLITHIASIS, HX OF 08/31/2008   POSTNASAL DRIP SYNDROME 05/28/2006    PCP: Nelwyn Salisbury, MD   REFERRING PROVIDER: Joen Laura, MD   REFERRING DIAG: [phone] (ICD-10-CM) - Status post right partial knee replacement   THERAPY DIAG:  Stiffness of right knee, not elsewhere classified  Other abnormalities of gait and mobility  Muscle weakness (generalized)  Localized edema  Acute pain of right knee  RATIONALE FOR EVALUATION AND TREATMENT: Rehabilitation  ONSET DATE: 09/18/2022 - R unicompartmental knee replacement  NEXT MD VISIT: 12/12/2022    SUBJECTIVE:                                                                                                                                                                                                         SUBJECTIVE STATEMENT: Pt reports she has been feeling well due to a UTI but was started on antibiotics yesterday and is feeling better today.  PAIN: Are you having pain? Yes: NPRS scale: 0-1/10 Pain location: front of right knee and R shin  Pain description: sore, occasional twinge  Aggravating factors: too much activity Relieving factors: exercise, warm showers, ice   PERTINENT HISTORY:  OA, anxiety, HTN, Raynaud's phenomenon, GERD, B CTS, DVT s/p COVID in 2021, syncope  PRECAUTIONS: None  RED FLAGS: None  WEIGHT BEARING RESTRICTIONS: No  FALLS:  Has patient fallen in last 6 months? 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 Go back to work part-time."   OBJECTIVE: (objective measures completed at initial evaluation unless otherwise dated)  DIAGNOSTIC FINDINGS:  09/18/22 - DG R knee:  IMPRESSION: Medial compartment hemiarthroplasty without immediate postoperative complication.  PATIENT SURVEYS:  LEFS 13 / 80 = 16.3 %  11/02/22: 44 / 80 = 55.0 %  COGNITION: Overall cognitive status: Within functional limits for tasks assessed    SENSATION: WFL  EDEMA:  Circumferential: 44 cm at joint line, 53 cm (5 cm above joint line)  MUSCLE LENGTH: Hamstrings: Mild/mod tight R>L ITB: Mild/mod tight R>L Piriformis: Mild tight R>L Hip flexors: Mild tight R>L Quads: Mod tight R>L Heelcord: NT  POSTURE:  No Significant postural limitations  PALPATION: R knee swollen and slightly warm to touch with mild erythema present.  Decreased R patellar mobility in all directions.  LOWER EXTREMITY ROM:  Active ROM Right eval Left eval R 09/27/22 R 09/29/22 R 10/02/22 R 10/06/22 R 10/12/22 R 10/19/22 R 10/26/22 R 11/02/22 R 11/14/22 R 11/23/22 R 11/30/22  Knee flexion 77 127 85 91 100 105 104 100 105 107  107 112 115  Knee extension -15 LAQ +2 -10 LAQ -9 LAQ -8 LAQ -7 LAQ -4 LAQ 0 LAQ 0 LAQ 0 LAQ 0 0 0   Passive ROM Right eval R 09/27/22 R 10/02/22 R 10/12/22  Knee flexion 82 85    Knee extension -5 0 -1 0   LOWER EXTREMITY MMT:  MMT Right eval Left eval R 10/19/22 L 10/19/22 R 11/02/22 L 11/02/22 R 11/30/22 L 11/30/22  Hip flexion 4+ 5 4+ 5 4+ 5 5 5   Hip extension 4 4 4 4 4 4  4+ 4+  Hip abduction 4- 4 4 4  4- 4 4 4   Hip adduction 4 4+ 4+ 4+ 4+ 4+ 4+ 4+  Hip internal rotation 4 4+ 4+ 4+ 4+ 5 5 5   Hip external rotation 4 4 4 4  4- 4 4  4+  Knee flexion 4 5 4+ 5 4+ 5 5 5   Knee extension 4  5 4+ 5 4+ 5 5 5   Ankle dorsiflexion 4 4 4 4 4  4+ 5 5  Ankle plantarflexion   4 (10 SLS HR) 4 (12 SLS HR)      Ankle inversion          Ankle eversion           (Blank rows = not tested)  FUNCTIONAL TESTS:  5 times sit to stand: 29.60 sec with weight shift to L LE Timed up and go (TUG): 23.79 sec with RW  GAIT: Distance walked: Clinic distances Assistive device utilized: Environmental consultant - 2 wheeled Level of assistance: Modified independence Gait pattern: step through pattern and decreased hip/knee flexion- Right Comments: Ambulates with stiff R knee but good heel strike on weight acceptance   TODAY'S TREATMENT:  11/30/22 THERAPEUTIC EXERCISE: to improve flexibility, strength and mobility.  Demonstration, verbal and tactile cues throughout for technique. Rec Bike - L3 x 6 min HEP review and consolidation SLS + hip hike over edge of step x 10 B resisted sidestepping with looped GTB at ankles Forward/backward monster walk with looped GTB at ankles Review of SLS + 4-way GTB resisted SLR focusing on maintaining level pelvis Verbal review of aquatic-based exercises to be  performed in pool at gym Verbal review of frequency and duration of cardio exercises at gym as well as group exercise classes she plans to join  THERAPEUTIC ACTIVITIES: R knee ROM assessment LE MMT LEFS Goal assessment  GAIT TRAINING: To normalize gait pattern. 600' w/o AD - good heel-toe gait pattern with normal hip and knee flexion but still with slight L. Trendelenburg during R stance phase of gait Stairs: Level of Assistance: Modified independence Stair Negotiation Technique: Alternating Pattern  with Single Rail on Left Number of Stairs: 14  Height of Stairs: 7"  Comments: Normal reciprocal pattern   11/28/22 THERAPEUTIC EXERCISE: to improve flexibility, strength and mobility.  Demonstration, verbal and tactile cues throughout for technique. Rec Bike - L3  x 6 min Seated hip ADD towel squeeze + R LAQ 2 x 10 Seated R heel slide with L foot overpressure for R knee flexion stretch 5 x 5" 4 way hip with GTB anchored to mat table x 10 each Standing lunge stretch on 8' step 10x5"  Game Ready vasopneumatic compression post session to R knee x 10 min, medium compression, 34 to reduce post-exercise pain and swelling/edema   11/23/22 THERAPEUTIC EXERCISE: to improve flexibility, strength and mobility.  Demonstration, verbal and tactile cues throughout for technique. Rec Bike - L3 x 6 min Seated hip ADD towel squeeze + R LAQ 2 x 10 Seated R heel slide with L foot overpressure for R knee flexion stretch 5 x 5" R knee step stretch for flexion ROM 5 x 5"  MANUAL THERAPY: To promote normalized muscle tension, improved flexibility, improved joint mobility, and increased ROM. Seated belt mobs for R knee flexion  Hooklying tibiofemoral A/P mobs for R knee flexion Inferior & medial patellar glides to increase R knee flexion Gravity assisted stretch and contract/relax into R knee flexion with L LE suspended over PT's arm Review of R knee patellar mobs and incisional scar mobilization to facilitate increased R knee flexion ROM   THERAPEUTIC ACTIVITIES:  Knee ROM assessment Sit to stand transfers from low seat height w/o UE assist to simulate commode transfers w/o BSC frame Addressed pt's questions/concerns regarding progression of activity including weaning BSC frame from toilet, ongoing positional concerns in relation to sleeping position and sitting in recliner, use of home and gym exercise equipment, and time frames for walking for exercise and use of exercise equipment.   PATIENT EDUCATION:  Education details: HEP review, HEP consolidation, and recommended frequency for ongoing HEP at discharge to prevent loss of gains achieved with PT  Person educated: Patient Education method: Explanation, Demonstration, and Handouts Education comprehension:  verbalized understanding  HOME EXERCISE PROGRAM: Access Code: [email or web address] URL: [email or web address] Date: 11/30/2022 Prepared by: Glenetta Hew  Exercises - Long Sitting 4 Way Patellar Glide  - 2-3 x daily - 7 x weekly - 2 sets - 10 reps - Supine Heel Slide with Strap  - 2 x daily - 7 x weekly - 2 sets - 10 reps - 3 sec hold - Standing Knee Flexion Stretch on Step  - 2 x daily - 7 x weekly - 2 sets - 10 reps - 5 sec hold - Heel Toe Raises with Counter Support  - 2 x daily - 7 x weekly - 2 sets - 10 reps - 3 sec hold - Sitting Knee Extension with Resistance  - 1 x daily - 3 x weekly - 2 sets - 10 reps - Seated Hamstring Curl with Anchored Resistance  - 1 x daily - 3  x weekly - 2 sets - 10 reps - Standing Hip Flexion with Anchored Resistance and Chair Support  - 1 x daily - 3 x weekly - 2 sets - 10 reps - 3 sec hold - Standing Hip Adduction with Anchored Resistance  - 1 x daily - 3 x weekly - 2 sets - 10 reps - 3 sec hold - Standing Hip Extension with Anchored Resistance  - 1 x daily - 3 x weekly - 2 sets - 10 reps - 3 sec hold - Standing Hip Abduction with Anchored Resistance  - 1 x daily - 3 x weekly - 2 sets - 10 reps - 3 sec hold - Side Stepping with Resistance at Ankles  - 1 x daily - 3 x weekly - 2 sets - 10 reps - Band Walks  - 1 x daily - 3 x weekly - 2 sets - 10 reps - Hip Hiking on Step  - 1 x daily - 3 x weekly - 1-2 sets - 10 reps - Step Up (Mirrored)  - 1 x daily - 3 x weekly - 1 sets - 10 reps - Tandem Stance in Corner  - 1 x daily - 3 x weekly - 1 sets - 6 reps - 30 seconds  hold - Single Leg Stance  - 1 x daily - 3 x weekly - 1 sets - 6 reps - 30 seconds  hold  Patient Education - Rubbing with Different Textures   ASSESSMENT:  CLINICAL IMPRESSION: Addisen has demonstrated good progress with physical therapy s/p R unicompartmental knee replacement.  She currently has R knee AROM of 0-115 allowing her to ambulate with normal gait pattern and ascend/descend  stairs reciprocally, however ongoing mild lateral weakness at hips contributing to slight L Trendelenburg during R stance phase of gait.  All PT goals now met for partially met and Martena feels ready to transition to her HEP after final review and clarification today, therefore will proceed with discharge from physical therapy for this episode.   OBJECTIVE IMPAIRMENTS: Abnormal gait, decreased activity tolerance, decreased balance, decreased endurance, decreased knowledge of condition, decreased knowledge of use of DME, decreased mobility, difficulty walking, decreased ROM, decreased strength, decreased safety awareness, increased edema, increased fascial restrictions, impaired perceived functional ability, increased muscle spasms, impaired flexibility, and pain.   ACTIVITY LIMITATIONS: sitting, standing, squatting, sleeping, stairs, transfers, bed mobility, bathing, toileting, dressing, locomotion level, and caring for others  PARTICIPATION LIMITATIONS: meal prep, cleaning, laundry, driving, shopping, community activity, and yard work  PERSONAL FACTORS: Fitness, Past/current experiences, Time since onset of injury/illness/exacerbation, and 3+ comorbidities: OA, anxiety, HTN, Raynaud's phenomenon, GERD, B CTS, DVT s/p COVID in 2021, syncope  are also affecting patient's functional outcome.   REHAB POTENTIAL: Excellent  CLINICAL DECISION MAKING: Stable/uncomplicated  EVALUATION COMPLEXITY: Low   GOALS: Goals reviewed with patient? Yes  SHORT TERM GOALS: Target date: 10/12/2022  Patient will be independent with initial HEP. Baseline: pt performing hospital issued HEP Goal status: MET  10/02/22  2.  Patient will demonstrate improved R knee AROM to >/= 5-90 deg to allow for normal gait pattern. Baseline: R knee AROM -15-77, PROM -5-82 Goal status: MET  10/02/22 - R knee AROM -1-100, lacking 8 extension in LAQ  LONG TERM GOALS: Target date: 11/02/2022; extended to 11/30/2022  Patient  will be independent with advanced/ongoing HEP to improve outcomes and carryover.  Baseline:  Goal status: MET  11/30/22   2.  Patient will report at least 75% improvement in R  knee pain to improve QOL. Baseline: 5-6/10 Goal status: MET  11/02/22 - 90-95% improvement in pain   3.  Patient will demonstrate improved R knee AROM to >/= -2-120 deg to allow for normal gait and stair mechanics. Baseline: R knee AROM -15-77, PROM -5-82 Goal status: PARTIALLY MET  11/30/22 - R knee AROM 0-115, with full extension to 0 in LAQ  4.  Patient will demonstrate improved B LE strength to >/= 4+/5 for improved stability and ease of mobility. Baseline: refer to above LE MMT table Goal status: PARTIALLY MET 11/30/22 - met except B hip ABD and R hip ER 4/5  5.  Patient will be able to ambulate 600' with or w/o LRAD and normal gait pattern without increased pain to access community.  Baseline: antalgic gait with RW Goal status: PARTIALLY MET  11/30/22 - 600' with slight L Trendelenburg gait pattern but no pain  6. Patient will be able to ascend/descend stairs with 1 HR and reciprocal step pattern safely to access home and community.  Baseline: NT Goal status: MET  11/30/22   7.  Patient will report >/= 30/80 on LEFS to demonstrate improved functional ability. Baseline: 13 / 80 = 16.3 % Goal status: MET  11/02/22 - 44 / 80 = 55.0 %  8.  Patient will improve 5x STS time to </= 15 seconds with even weight shift to demonstrate improved functional strength and transfer efficiency . Baseline: 29.60 sec with weight shift to L LE Goal status: MET  11/02/22 - 14.91 sec  9.  Patient will demonstrate decreased TUG time to </= 13.5 sec with or w/o LRAD to decrease risk for falls with transitional mobility Baseline: 23.79 sec with RW Goal status: MET  11/02/22 - 9.78 sec   PLAN:  PT FREQUENCY: 2x/week   PT DURATION: 4 weeks  PLANNED INTERVENTIONS: 97164- PT Re-evaluation, 97110-Therapeutic exercises,  97530- Therapeutic activity, 97112- Neuromuscular re-education, 97535- Self Care, 30865- Manual therapy, 97116- Gait training, 97014- Electrical stimulation (unattended), 97016- Vasopneumatic device, 97035- Ultrasound, 78469- Ionotophoresis 4mg /ml Dexamethasone, Patient/Family education, Balance training, Stair training, Taping, Dry Needling, Joint mobilization, DME instructions, Cryotherapy, Moist heat, Therapeutic exercises, Therapeutic activity, Neuromuscular re-education, Gait training, and Self Care  PLAN FOR NEXT SESSION: Transition to HEP and gym program + D/C from PT.   PHYSICAL THERAPY DISCHARGE SUMMARY  Visits from Start of Care: 22  Current functional level related to goals / functional outcomes: Refer to above clinical impression and goal assessment.    Remaining deficits: As above.  Mild lateral hip weakness contributing to slight L Trendelenburg gait pattern during R stance phase of gait.  Jaleh is independent with ongoing HEP to address this weakness.   Education / Equipment: HEP  Patient agrees to discharge. Patient goals were met or partially met. Patient is being discharged due to being pleased with the current functional level.   Marry Guan, PT  11/30/22 12:13 PM     Date of referral: 08/10/22 Referring provider: Joen Laura, MD Referring diagnosis? G29.528 (ICD-10-CM) - Status post right partial knee replacement  Treatment diagnosis? (if different than referring diagnosis)  Stiffness of right knee, not elsewhere classified  Other abnormalities of gait and mobility  Muscle weakness (generalized)  Localized edema  Acute pain of right knee  What was this (referring dx) caused by? Surgery (Type: R unicondylar knee replacement) and Arthritis  Nature of Condition: Initial Onset (within last 3 months)   Laterality: Rt  Current Functional Measure Score: LEFS 44 /  80 = 55.0 %  Objective measurements identify impairments when they are compared  to normal values, the uninvolved extremity, and prior level of function.  [x]  Yes  []  No  Objective assessment of functional ability: Moderate functional limitations   Briefly describe symptoms: Ongoing deficits include intermittent R knee pain, increased edema in R LE, limited and painful endrange R knee flexion ROM, R>L LE weakness and impaired ADLs, gait and mobility, especially stair negotiation.  How did symptoms start: OA leading to R unicondylar knee replacement on 09/18/22  Average pain intensity:  Last 24 hours: 0-1/10  Past week: 1/10  How often does the pt experience symptoms? Intermittently  How much have the symptoms interfered with usual daily activities? Moderately  How has condition changed since care began at this facility? Better  In general, how is the patients overall health? Very Good

## 2022-12-11 ENCOUNTER — Telehealth: Payer: Self-pay | Admitting: Family

## 2022-12-11 ENCOUNTER — Other Ambulatory Visit: Payer: Self-pay | Admitting: Family

## 2022-12-11 DIAGNOSIS — I10 Essential (primary) hypertension: Secondary | ICD-10-CM

## 2022-12-11 NOTE — Telephone Encounter (Signed)
It looks like the patient has transferred her care back to Dr. Clent Ridges.  Please confirm this with patient and if so, cancel follow up with me in January.

## 2022-12-11 NOTE — Telephone Encounter (Signed)
Called home and cell numbers but no answer and mail box was full.

## 2022-12-12 DIAGNOSIS — M1711 Unilateral primary osteoarthritis, right knee: Secondary | ICD-10-CM | POA: Diagnosis not present

## 2022-12-12 NOTE — Telephone Encounter (Signed)
Patient has confirmed she will be back to seeing Dr. Clent Ridges and future appointment with Efraim Kaufmann has been cancelled

## 2022-12-14 DIAGNOSIS — R300 Dysuria: Secondary | ICD-10-CM | POA: Diagnosis not present

## 2022-12-18 ENCOUNTER — Telehealth: Payer: Self-pay | Admitting: Family Medicine

## 2022-12-18 MED ORDER — LORAZEPAM 0.5 MG PO TABS: 0.5000 mg | ORAL_TABLET | Freq: Three times a day (TID) | ORAL | 5 refills | Status: DC | PRN

## 2022-12-18 NOTE — Telephone Encounter (Signed)
Pt is calling and would like a new rx for lorazepam 0.25  to take as needed and she would like dr fry to know her gyn dr [name] Bar put her on estradiol (ESTRACE) 0.01 MG/GM vaginal cream and she would like to make sure that med would not interfere with lorazepam  [facility] [id number] - [place], [place] - [address] Phone: [phone]  Fax: [phone]

## 2022-12-18 NOTE — Telephone Encounter (Signed)
Done

## 2022-12-19 NOTE — Telephone Encounter (Signed)
Spoke with pt verbalized understanding 

## 2023-01-11 DIAGNOSIS — M1711 Unilateral primary osteoarthritis, right knee: Secondary | ICD-10-CM | POA: Diagnosis not present

## 2023-01-17 ENCOUNTER — Encounter: Payer: Self-pay | Admitting: Family Medicine

## 2023-01-17 ENCOUNTER — Ambulatory Visit (INDEPENDENT_AMBULATORY_CARE_PROVIDER_SITE_OTHER): Payer: Medicare Other | Admitting: Family Medicine

## 2023-01-17 VITALS — BP 128/78 | HR 85 | Temp 98.1°F | Wt 216.0 lb

## 2023-01-17 DIAGNOSIS — M7989 Other specified soft tissue disorders: Secondary | ICD-10-CM

## 2023-01-17 DIAGNOSIS — N390 Urinary tract infection, site not specified: Secondary | ICD-10-CM | POA: Diagnosis not present

## 2023-01-17 DIAGNOSIS — R609 Edema, unspecified: Secondary | ICD-10-CM | POA: Diagnosis not present

## 2023-01-17 LAB — BASIC METABOLIC PANEL
BUN: 21 mg/dL (ref 6–23)
CO2: 30 meq/L (ref 19–32)
Calcium: 10.8 mg/dL — ABNORMAL HIGH (ref 8.4–10.5)
Chloride: 102 meq/L (ref 96–112)
Creatinine, Ser: 0.76 mg/dL (ref 0.40–1.20)
GFR: 79.48 mL/min (ref >60.00)
Glucose, Bld: 87 mg/dL (ref 70–99)
Potassium: 4 meq/L (ref 3.5–5.1)
Sodium: 137 meq/L (ref 135–145)

## 2023-01-17 LAB — POC URINALSYSI DIPSTICK (AUTOMATED)
Bilirubin, UA: NEGATIVE
Blood, UA: NEGATIVE
Glucose, UA: NEGATIVE
Ketones, UA: NEGATIVE
Nitrite, UA: NEGATIVE
Protein, UA: NEGATIVE
Spec Grav, UA: 1.01 (ref 1.010–1.025)
Urobilinogen, UA: 0.2 U/dL
pH, UA: 6.5 (ref 5.0–8.0)

## 2023-01-17 MED ORDER — SULFAMETHOXAZOLE-TRIMETHOPRIM 800-160 MG PO TABS: 1.0000 | ORAL_TABLET | Freq: Two times a day (BID) | ORAL | 0 refills | Status: DC

## 2023-01-17 NOTE — Progress Notes (Signed)
   Subjective:    Patient ID: Megan Crane, female    DOB: [birth year], 73 y.o.   MRN: [id number]  HPI Here for several issues. First she wants to see if she still has a UTI. We saw her on 11-17-22 for urinary burning and urgency, and a culture grew Klebsiella. We gave her a course of Macrobid , but says this only helped to a degree. She then saw her GYN, Dr. Lamar Spence, on 11-29-22, and she still had signs of the UTI. He gave her a course of Keflex , which she has completed. He also diagnosed her with a yeast infection which he treated with Triamcinolone  cream and Nystatin  cream. Today she says she still feels slight discomfort when she urinates, and her UA today shows 15 leukocytes. No fevers. The other issue is pain and swelling around the right knee and lower leg that started a few weeks ago. She saw her orthopedist, Dr. Edna, and he took Xrays. He said her knee hardware looks fine, and he advised her to see us  about it. No recent trauma.    Review of Systems  Constitutional: Negative.   Respiratory: Negative.    Cardiovascular: Negative.   Genitourinary:  Positive for dysuria. Negative for flank pain, frequency, hematuria and urgency.  Musculoskeletal:  Positive for arthralgias.       Objective:   Physical Exam Constitutional:      Appearance: Normal appearance. She is not ill-appearing.  Cardiovascular:     Rate and Rhythm: Normal rate and regular rhythm.     Pulses: Normal pulses.     Heart sounds: Normal heart sounds.  Pulmonary:     Effort: Pulmonary effort is normal.     Breath sounds: Normal breath sounds.  Abdominal:     Tenderness: There is no right CVA tenderness or left CVA tenderness.  Musculoskeletal:     Comments: Right knee is normal on exam with no tenderness and normal ROM. The right lower leg is mildly swollen and tender, and she has a number of venous varicosities under the skin. No erythema or warmth.   Neurological:     Mental Status: She is alert.            Assessment & Plan:  She still has remnants of the UTI, so we will treat with 10 days of Bactrim  DS. The swelling and pain in the right lower leg seems to be coming from phlebitis of the varicose veins. She will stay off her feet, elevate the leg, and apply warm compresses. We will set up a venous doppler of the right leg as well.  Garnette Olmsted, MD

## 2023-01-17 NOTE — Addendum Note (Signed)
 Addended by: Carola Rhine on: 01/17/2023 05:00 PM   Modules accepted: Orders

## 2023-01-18 ENCOUNTER — Ambulatory Visit (HOSPITAL_COMMUNITY)
Admission: RE | Admit: 2023-01-18 | Discharge: 2023-01-18 | Disposition: A | Discharge status: Home / Self Care | Payer: Medicare Other | Source: Ambulatory Visit | Attending: Family Medicine | Admitting: Family Medicine

## 2023-01-18 DIAGNOSIS — M7989 Other specified soft tissue disorders: Secondary | ICD-10-CM | POA: Diagnosis not present

## 2023-02-01 DIAGNOSIS — D1801 Hemangioma of skin and subcutaneous tissue: Secondary | ICD-10-CM | POA: Diagnosis not present

## 2023-02-01 DIAGNOSIS — L821 Other seborrheic keratosis: Secondary | ICD-10-CM | POA: Diagnosis not present

## 2023-02-01 DIAGNOSIS — L650 Telogen effluvium: Secondary | ICD-10-CM | POA: Diagnosis not present

## 2023-02-01 DIAGNOSIS — L649 Androgenic alopecia, unspecified: Secondary | ICD-10-CM | POA: Diagnosis not present

## 2023-02-01 DIAGNOSIS — D235 Other benign neoplasm of skin of trunk: Secondary | ICD-10-CM | POA: Diagnosis not present

## 2023-02-01 DIAGNOSIS — L570 Actinic keratosis: Secondary | ICD-10-CM | POA: Diagnosis not present

## 2023-02-01 DIAGNOSIS — Z85828 Personal history of other malignant neoplasm of skin: Secondary | ICD-10-CM | POA: Diagnosis not present

## 2023-02-01 DIAGNOSIS — L723 Sebaceous cyst: Secondary | ICD-10-CM | POA: Diagnosis not present

## 2023-02-01 DIAGNOSIS — L853 Xerosis cutis: Secondary | ICD-10-CM | POA: Diagnosis not present

## 2023-02-09 ENCOUNTER — Encounter: Payer: Self-pay | Admitting: Family Medicine

## 2023-02-09 ENCOUNTER — Ambulatory Visit: Payer: Medicare Other | Admitting: Family Medicine

## 2023-02-09 ENCOUNTER — Ambulatory Visit: Payer: Medicare Other | Admitting: Family

## 2023-02-09 VITALS — BP 118/80 | HR 83 | Temp 97.9°F | Wt 218.0 lb

## 2023-02-09 DIAGNOSIS — N390 Urinary tract infection, site not specified: Secondary | ICD-10-CM

## 2023-02-09 DIAGNOSIS — I10 Essential (primary) hypertension: Secondary | ICD-10-CM

## 2023-02-09 DIAGNOSIS — L03012 Cellulitis of left finger: Secondary | ICD-10-CM

## 2023-02-09 LAB — POC URINALSYSI DIPSTICK (AUTOMATED)
Bilirubin, UA: NEGATIVE
Blood, UA: NEGATIVE
Glucose, UA: NEGATIVE
Ketones, UA: NEGATIVE
Leukocytes, UA: NEGATIVE
Nitrite, UA: NEGATIVE
Protein, UA: NEGATIVE
Spec Grav, UA: 1.015 (ref 1.010–1.025)
Urobilinogen, UA: 0.2 U/dL
pH, UA: 6 (ref 5.0–8.0)

## 2023-02-09 MED ORDER — VALSARTAN 40 MG PO TABS
40.0000 mg | ORAL_TABLET | Freq: Every day | ORAL | 3 refills | Status: AC
Start: 2023-02-09 — End: ?

## 2023-02-09 MED ORDER — CEPHALEXIN 500 MG PO CAPS: 500.0000 mg | ORAL_CAPSULE | Freq: Three times a day (TID) | ORAL | 0 refills | Status: DC

## 2023-02-09 NOTE — Addendum Note (Signed)
Addended by: Gershon Crane A on: 02/09/2023 11:17 AM   Modules accepted: Orders, Level of Service

## 2023-02-09 NOTE — Addendum Note (Signed)
Addended by: Carola Rhine on: 02/09/2023 11:28 AM   Modules accepted: Orders

## 2023-02-09 NOTE — Progress Notes (Addendum)
   Subjective:    Patient ID: Megan Crane, female    DOB: [birth year], 73 y.o.   MRN: [id number]  HPI Here to follow up on a recent UTI. She was here on 01-17-23 with recurrent UTI symptoms, and we treated her with 7 days of Bactrim DS. Now she feels fine. She has started drinking much more water every day, and she has stopped drinking coffee.  Her UA today is clear. Also she has had soreness around the nail on her left pinkie finger for several days, and she wonders if she has a splinter in it.   Review of Systems  Constitutional: Negative.   Respiratory: Negative.    Cardiovascular: Negative.   Genitourinary: Negative.   Skin:  Positive for color change.       Objective:   Physical Exam Constitutional:      Appearance: Normal appearance.  Cardiovascular:     Rate and Rhythm: Normal rate and regular rhythm.     Pulses: Normal pulses.     Heart sounds: Normal heart sounds.  Pulmonary:     Effort: Pulmonary effort is normal.     Breath sounds: Normal breath sounds.  Skin:    Comments: The skin around the nail on her left 5th finger is red and tender. There is a black area on the medial side of the nail. We explored this with a sterile needle, but no foreign object was found   Neurological:     Mental Status: She is alert.           Assessment & Plan:  The recent UTI has resolved. She also has a paronychia on the finger, so we will treat with 7 days of Keflex. Gershon Crane, MD

## 2023-02-21 DIAGNOSIS — H2513 Age-related nuclear cataract, bilateral: Secondary | ICD-10-CM | POA: Diagnosis not present

## 2023-02-21 DIAGNOSIS — H47323 Drusen of optic disc, bilateral: Secondary | ICD-10-CM | POA: Diagnosis not present

## 2023-03-01 ENCOUNTER — Ambulatory Visit: Payer: Self-pay | Admitting: Family Medicine

## 2023-03-01 NOTE — Telephone Encounter (Signed)
  Chief Complaint: red area under abd fold, above pubic area Symptoms: sweet smelling odor, redness Frequency: about 10 days Pertinent Negatives: Patient denies fever, DM Disposition: [] ED /[] Urgent Care (no appt availability in office) / [x] Appointment(In office/virtual)/ []  [facility]/ [] Home Care/ [] Refused Recommended Disposition /[] [facility]/ []  Follow-up with PCP Additional Notes: pt would like abx called in by pcp for her red area under her abd area. Pt states that she has had this before an another part of the body and he rx'd a sulfa medication. Pt states that it itches at times, states that it smells sweet at times as well. Pt sched with another provider in pcp office tomorrow. Pt would like a call back if DR Clent Ridges is willing to write an rx without being seen. Pt advised care instructions per Epic.  Copied from CRM [id number]. Topic: Clinical - Medical Advice >> Mar 01, 2023 11:37 AM Elizebeth Brooking wrote: Reason for CRM: Patient called in stating she believes she has a east infection underneath her stomach, would like for someone to send her an antibiotic in for this issue Reason for Disposition  Red, moist, irritated area between skin folds (or under larger breasts)  Answer Assessment - Initial Assessment Questions 1. APPEARANCE of RASH: "Describe the rash."      Medium red 2. LOCATION: "Where is the rash located?"      Fold under ABD, but above the pubic area 3. NUMBER: "How many spots are there?"      1 4. SIZE: "How big are the spots?" (Inches, centimeters or compare to size of a coin)      Most of my fold on my lower belly fold, about 6" long 5. ONSET: "When did the rash start?"      Ongoing for about a week 6. ITCHING: "Does the rash itch?" If Yes, ask: "How bad is the itch?"  (Scale 0-10; or none, mild, moderate, severe)     Sometimes it kind of itch 7. PAIN: "Does the rash hurt?" If Yes, ask: "How bad is the pain?"  (Scale 0-10; or none, mild, moderate,  severe)    - NONE (0): no pain    - MILD (1-3): doesn't interfere with normal activities     - MODERATE (4-7): interferes with normal activities or awakens from sleep     - SEVERE (8-10): excruciating pain, unable to do any normal activities     Not painful 8. OTHER SYMPTOMS: "Do you have any other symptoms?" (e.g., fever)     denies  Protocols used: Rash or Redness - Localized-A-AH

## 2023-03-02 ENCOUNTER — Encounter: Payer: Self-pay | Admitting: Family Medicine

## 2023-03-02 ENCOUNTER — Ambulatory Visit: Payer: Medicare Other | Admitting: Family Medicine

## 2023-03-02 VITALS — BP 122/76 | HR 90 | Temp 98.1°F | Ht 66.5 in | Wt 219.8 lb

## 2023-03-02 DIAGNOSIS — R399 Unspecified symptoms and signs involving the genitourinary system: Secondary | ICD-10-CM | POA: Diagnosis not present

## 2023-03-02 DIAGNOSIS — B379 Candidiasis, unspecified: Secondary | ICD-10-CM | POA: Diagnosis not present

## 2023-03-02 LAB — POC URINALSYSI DIPSTICK (AUTOMATED)
Bilirubin, UA: NEGATIVE
Blood, UA: NEGATIVE
Glucose, UA: NEGATIVE
Ketones, UA: NEGATIVE
Leukocytes, UA: NEGATIVE
Nitrite, UA: NEGATIVE
Protein, UA: NEGATIVE
Spec Grav, UA: 1.025 (ref 1.010–1.025)
Urobilinogen, UA: NEGATIVE U/dL — AB
pH, UA: 6 (ref 5.0–8.0)

## 2023-03-02 MED ORDER — NYSTATIN 100000 UNIT/GM EX POWD: 1.0000 | Freq: Three times a day (TID) | CUTANEOUS | 0 refills | Status: DC

## 2023-03-02 NOTE — Progress Notes (Signed)
Acute Office Visit   Subjective:  Patient ID: Megan Crane, female    DOB: 1950-02-21, 73 y.o.   MRN: [id number]  Chief Complaint  Patient presents with   Rash    Rash/redness since a little over a week.     HPI Patient is complaining of a "break out", itching skin breakdown on the lower skin fold. She reports it is started about a week ago. Sitting or wearing tights and jeans can make it worse. Showering makes it feel better.   Patient has had a urinary tract infection that was last treated with Bactrim DS. She is requesting to have her urinary checked to make sure she does not have a UTI. Denies any symptoms.    ROS See HPI above      Objective:   BP 122/76 (BP Location: Left Arm, Patient Position: Sitting, Cuff Size: Large)   Pulse 90   Temp 98.1 F (36.7 C) (Oral)   Ht 5' 6.5" (1.689 m)   Wt 219 lb 12.8 oz (99.7 kg)   SpO2 (!) 9%   BMI 34.95 kg/m    Physical Exam Vitals reviewed.  Constitutional:      General: She is not in acute distress.    Appearance: Normal appearance. She is not ill-appearing, toxic-appearing or diaphoretic.  HENT:     Head: Normocephalic and atraumatic.  Eyes:     General:        Right eye: No discharge.        Left eye: No discharge.     Conjunctiva/sclera: Conjunctivae normal.  Cardiovascular:     Rate and Rhythm: Normal rate and regular rhythm.     Heart sounds: Normal heart sounds. No murmur heard.    No friction rub. No gallop.  Pulmonary:     Effort: Pulmonary effort is normal. No respiratory distress.     Breath sounds: Normal breath sounds.  Musculoskeletal:        General: Normal range of motion.  Skin:    General: Skin is warm and dry.     Findings: Rash (See picture included.) present.  Neurological:     General: No focal deficit present.     Mental Status: She is alert and oriented to person, place, and time. Mental status is at baseline.  Psychiatric:        Mood and Affect: Mood normal.        Behavior:  Behavior normal.        Thought Content: Thought content normal.        Judgment: Judgment normal.     Results for orders placed or performed in visit on 03/02/23  POCT Urinalysis Dipstick (Automated)  Result Value Ref Range   Color, UA Yellow    Clarity, UA Clear    Glucose, UA Negative Negative   Bilirubin, UA Negative    Ketones, UA Negative    Spec Grav, UA 1.025 1.010 - 1.025   Blood, UA Negative    pH, UA 6.0 5.0 - 8.0   Protein, UA Negative Negative   Urobilinogen, UA negative (A) 0.2 or 1.0 E.U./dL   Nitrite, UA Negative    Leukocytes, UA Negative Negative       Assessment & Plan:  Candidiasis -     Nystatin; Apply 1 Application topically 3 (three) times daily.  Dispense: 60 g; Refill: 0  UTI symptoms -     POCT Urinalysis Dipstick (Automated)  -Urinalysis is negative for a urinary tract infection.  -Recommend  to cleanse rash with Dove soap, gently wash area, pat dry, and apply Nystatin powder to the area.  -Provided information skin yeast infection.  -Follow up if not improved.   No follow-ups on file.  Zandra Abts, NP

## 2023-03-02 NOTE — Patient Instructions (Addendum)
-  Urinalysis is negative for a urinary tract infection.  -Recommend to cleanse rash with Dove soap, gently wash area, pat dry, and apply Nystatin powder to the area.  -Provided information skin yeast infection.  -Follow up if not improved.

## 2023-03-06 ENCOUNTER — Telehealth: Payer: Self-pay

## 2023-03-06 NOTE — Telephone Encounter (Signed)
 Copied from CRM [id number]. Topic: Clinical - Medical Advice >> Mar 06, 2023  8:06 AM Isabell A wrote: Reason for CRM: Patient is requesting to speak with Harriett Sine in regard to her yeast infection.

## 2023-03-06 NOTE — Telephone Encounter (Signed)
 Pt was seen on 03/02/23 by Zandra Abts

## 2023-03-07 ENCOUNTER — Telehealth: Payer: Self-pay

## 2023-03-07 NOTE — Telephone Encounter (Signed)
 Copied from CRM [id number]. Topic: Clinical - Prescription Issue >> Mar 07, 2023 10:36 AM Turkey A wrote: Reason for CRM: [facility] called said patient said she is not doing well with the Nystatin Powder would prefer a new script for the Nystatin Cream

## 2023-03-07 NOTE — Telephone Encounter (Signed)
 Pt message was sent to Dr Sarajane Jews for advise

## 2023-03-07 NOTE — Telephone Encounter (Signed)
 Spoke with pt request if Dr Clent Ridges can send another Rx for her yeast infections, states that the cream and powder prescribed are not working. Please advise

## 2023-03-08 MED ORDER — FLUCONAZOLE 150 MG PO TABS: 150.0000 mg | ORAL_TABLET | Freq: Every day | ORAL | 2 refills | Status: DC

## 2023-03-08 NOTE — Telephone Encounter (Signed)
 Notified pt, verbalized understanding.

## 2023-03-08 NOTE — Addendum Note (Signed)
 Addended by: Gershon Crane A on: 03/08/2023 12:46 PM   Modules accepted: Orders

## 2023-03-08 NOTE — Telephone Encounter (Signed)
 I sent in Diflucan pills

## 2023-03-13 ENCOUNTER — Other Ambulatory Visit: Payer: Self-pay | Admitting: Family

## 2023-03-13 DIAGNOSIS — Z1231 Encounter for screening mammogram for malignant neoplasm of breast: Secondary | ICD-10-CM | POA: Diagnosis not present

## 2023-03-13 DIAGNOSIS — I10 Essential (primary) hypertension: Secondary | ICD-10-CM

## 2023-03-13 DIAGNOSIS — M1711 Unilateral primary osteoarthritis, right knee: Secondary | ICD-10-CM | POA: Diagnosis not present

## 2023-03-21 IMAGING — US US EXTREM LOW VENOUS*L*
1 series · 13 of 24 positions shown · non-contrast
Comparison: None.

CLINICAL DATA: Painful palpable superficial veins and erythema at
the level of the left medial thigh.



[Series 1: us venous img lower uni left (dvt) · portal-venous · 62 acquisitions, 13 frames shown]
[im 1/62]
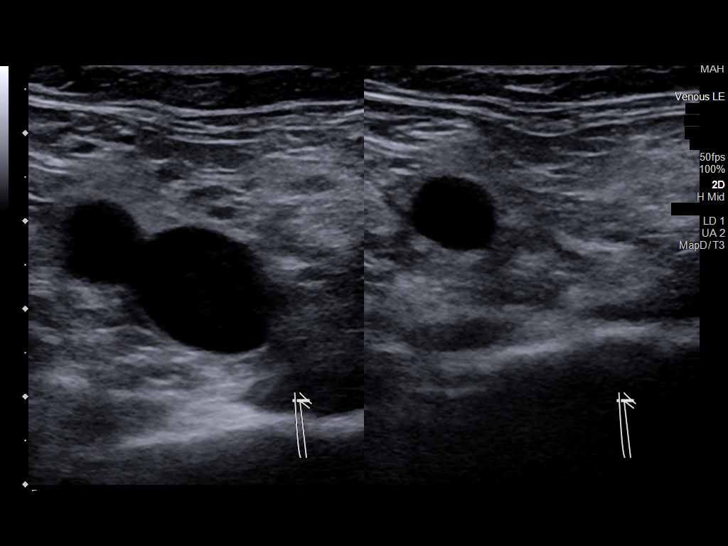
[im 6/62]
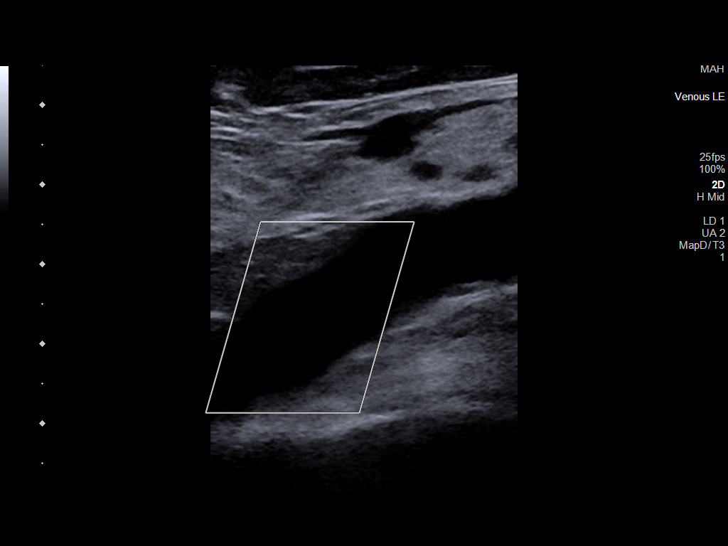
[im 11/62]
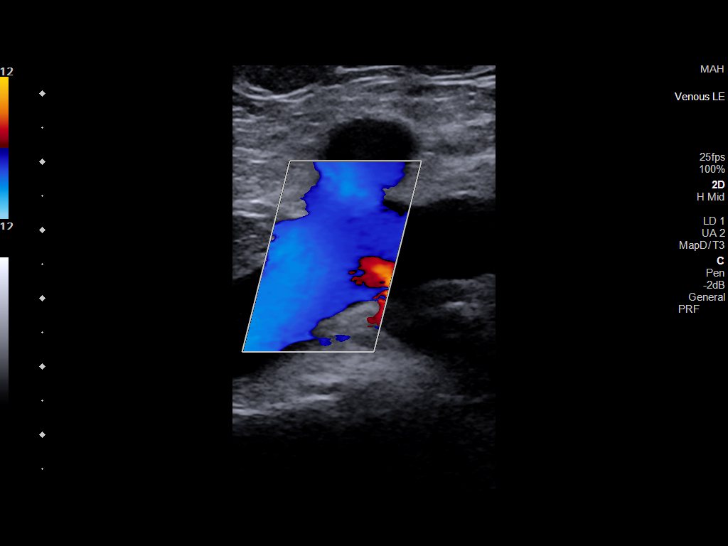
[im 16/62]
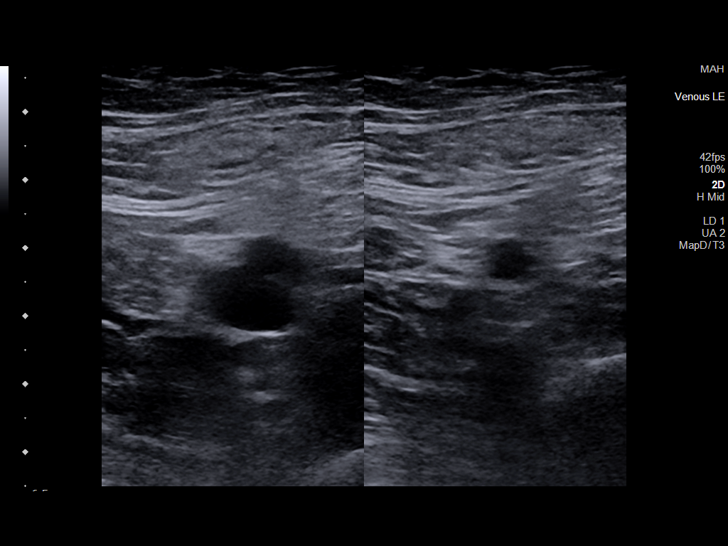
[im 22/62]
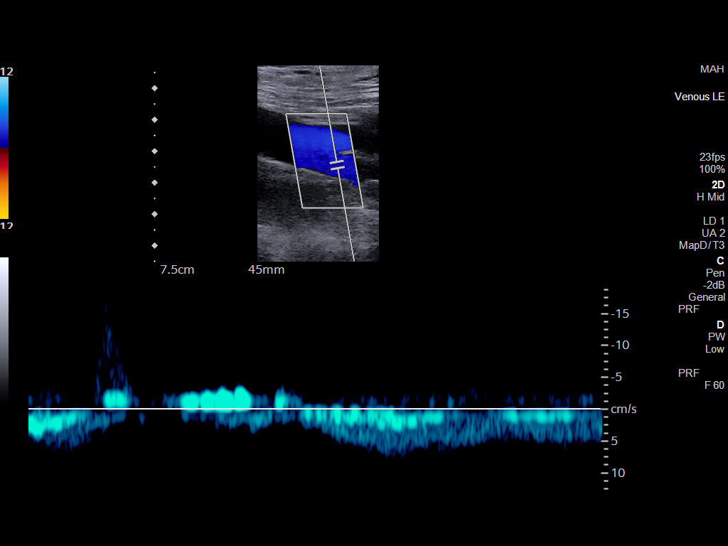
[im 27/62]
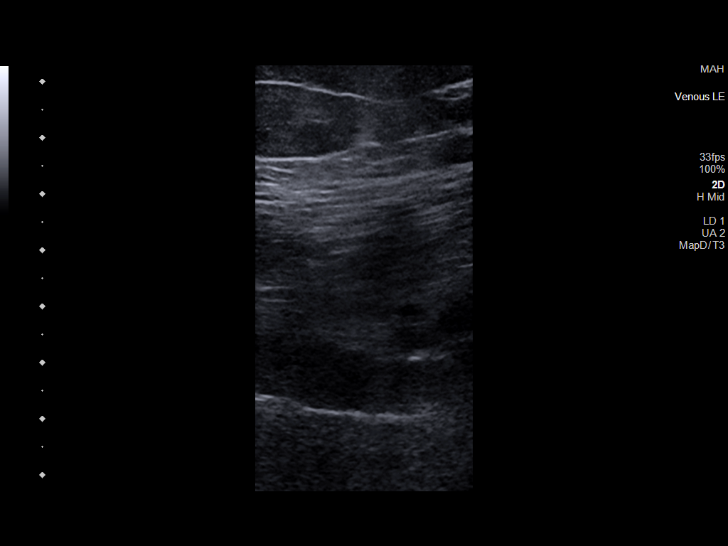
[im 35/62]
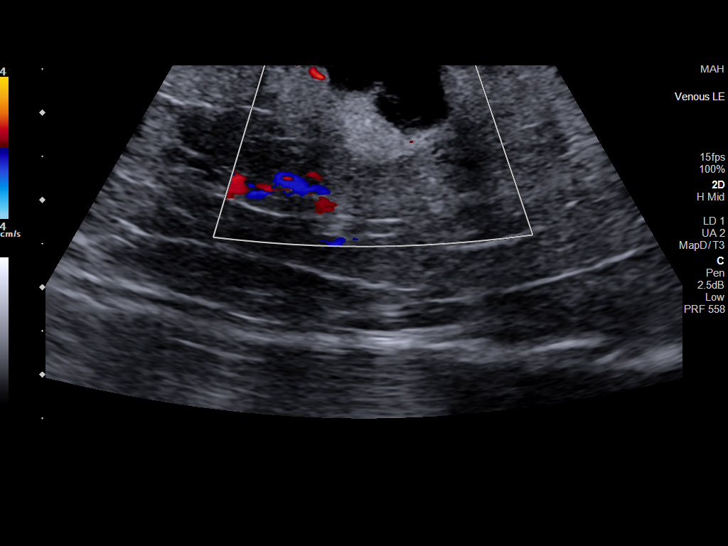
[im 38/62]
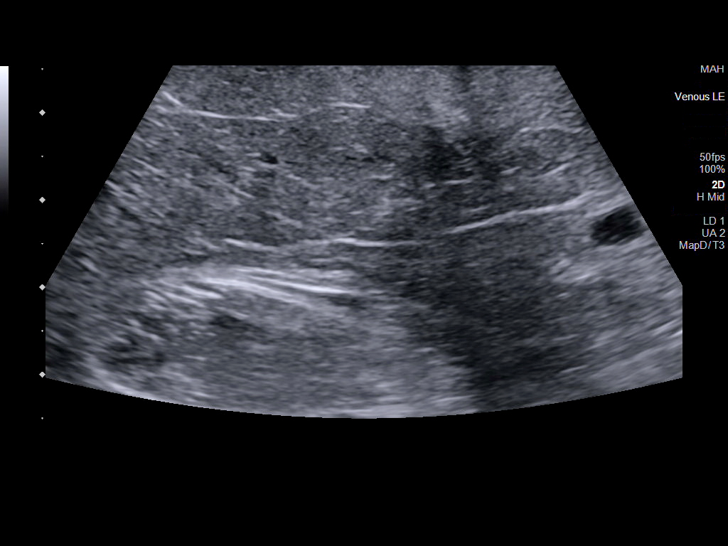
[im 40/62]
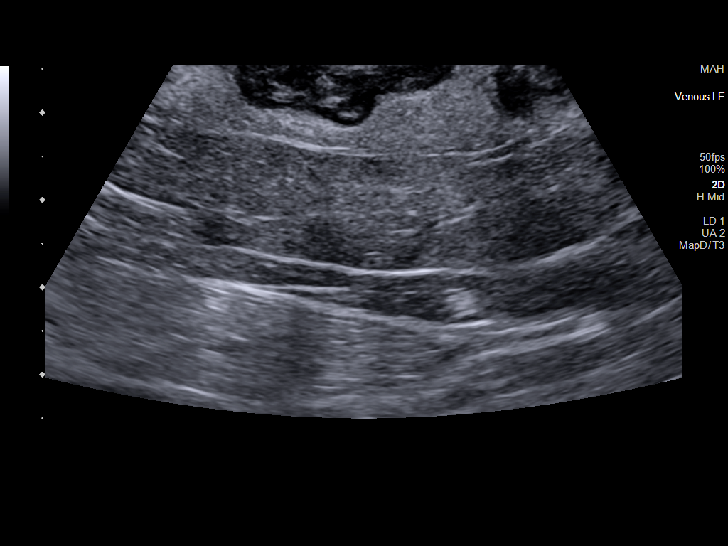
[im 46/62]
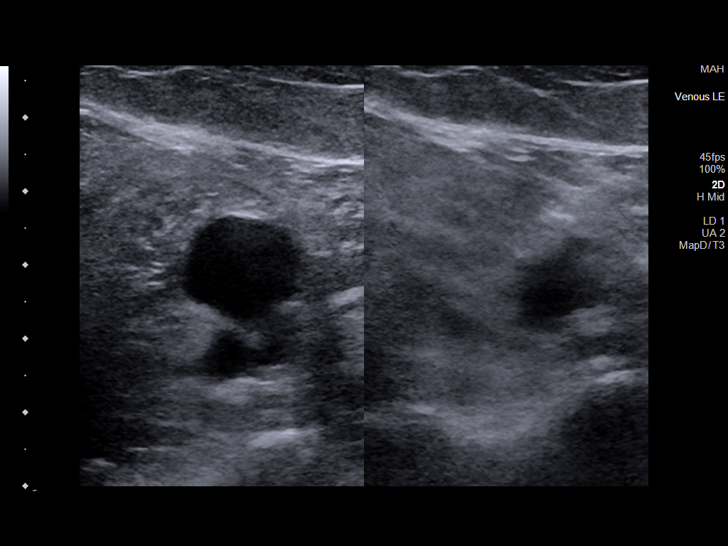
[im 51/62]
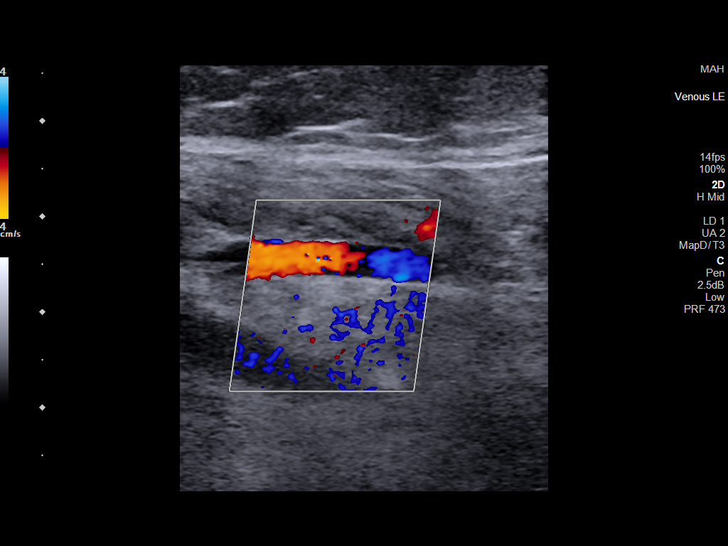
[im 56/62]
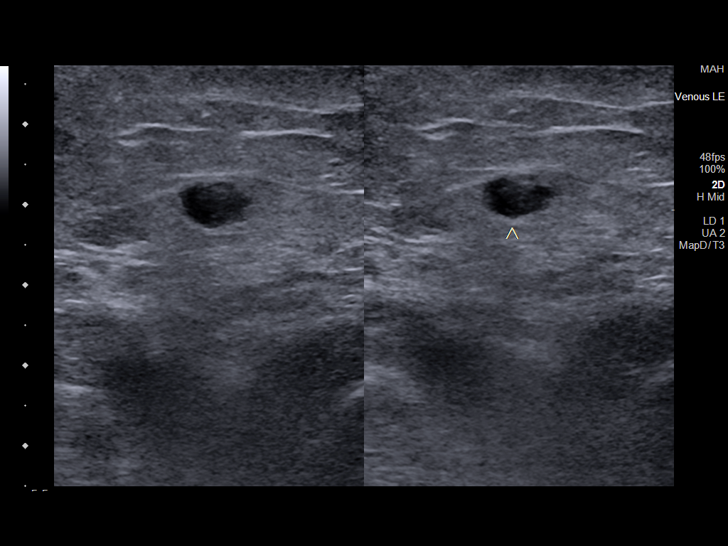
[im 62/62]
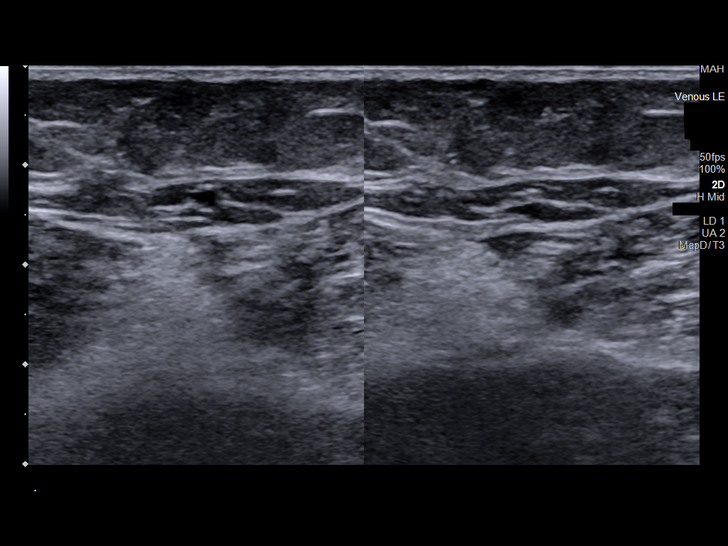

[13 of 24 positions shown; findings below may reference images not displayed]

FINDINGS: Contralateral Common Femoral Vein: Respiratory phasicity is normal
and symmetric with the symptomatic side. No evidence of thrombus.
Normal compressibility.

Common Femoral Vein: No evidence of thrombus. Normal
compressibility, respiratory phasicity and response to augmentation.

Saphenofemoral Junction: No evidence of thrombus. Normal
compressibility and flow on color Doppler imaging.

Profunda Femoral Vein: No evidence of thrombus. Normal
compressibility and flow on color Doppler imaging.

Femoral Vein: No evidence of thrombus. Normal compressibility,
respiratory phasicity and response to augmentation.

Popliteal Vein: No evidence of thrombus. Normal compressibility,
respiratory phasicity and response to augmentation.

Calf Veins: No evidence of thrombus. Normal compressibility and flow
on color Doppler imaging.

Superficial Great Saphenous Vein: Region of palpable abnormality
corresponds to superficial thrombophlebitis within the left great
saphenous vein as well as several communicating varicosities.
Thrombus extends throughout the medial thigh and into the proximal
calf. The upper segment of the GSV near the saphenofemoral junction
is normally patent and there is no extension of thrombus into the
common femoral vein.

Venous Reflux:  None.

Other Findings:  No abnormal fluid collections.
IMPRESSION: 1. No evidence of left lower extremity deep vein thrombosis.
2. Superficial thrombophlebitis of the left great saphenous vein in
the medial thigh and proximal calf as well as within several
communicating varicosities.

## 2023-03-26 ENCOUNTER — Telehealth: Payer: Self-pay

## 2023-03-26 NOTE — Telephone Encounter (Signed)
 Copied from CRM [id number]. Topic: Clinical - Medication Question >> Mar 26, 2023 10:10 AM Fonda Kinder J wrote: Reason for CRM: PT is having her teeth cleaned on 03/25 and her orthodontist ordered her to take amoxicillin 500mg  a week before and she wants to check with Dr. Clent Ridges that this is okay. Pt is requesting a callback

## 2023-03-26 NOTE — Telephone Encounter (Signed)
 Yes she should take his

## 2023-03-27 NOTE — Telephone Encounter (Signed)
 Left message for patient to advise that medication was sent to the pharmacy already

## 2023-03-27 NOTE — Telephone Encounter (Signed)
 I already sent in a RX for Diflucan on 03-08-23 (it had 2 refills available)

## 2023-04-02 ENCOUNTER — Ambulatory Visit (INDEPENDENT_AMBULATORY_CARE_PROVIDER_SITE_OTHER): Admitting: Family Medicine

## 2023-04-02 ENCOUNTER — Encounter: Payer: Self-pay | Admitting: Family Medicine

## 2023-04-02 VITALS — BP 110/68 | HR 88 | Temp 98.1°F | Wt 220.0 lb

## 2023-04-02 DIAGNOSIS — R7303 Prediabetes: Secondary | ICD-10-CM

## 2023-04-02 DIAGNOSIS — B379 Candidiasis, unspecified: Secondary | ICD-10-CM

## 2023-04-02 LAB — POC URINALSYSI DIPSTICK (AUTOMATED)
Bilirubin, UA: NEGATIVE
Blood, UA: NEGATIVE
Glucose, UA: NEGATIVE
Ketones, UA: NEGATIVE
Leukocytes, UA: NEGATIVE
Nitrite, UA: NEGATIVE
Protein, UA: NEGATIVE
Spec Grav, UA: 1.015 (ref 1.010–1.025)
Urobilinogen, UA: 0.2 U/dL
pH, UA: 5.5 (ref 5.0–8.0)

## 2023-04-02 LAB — POCT GLYCOSYLATED HEMOGLOBIN (HGB A1C): Hemoglobin A1C: 5.5 % (ref 4.0–5.6)

## 2023-04-02 MED ORDER — KETOCONAZOLE 2 % EX CREA: 1.0000 | TOPICAL_CREAM | Freq: Two times a day (BID) | CUTANEOUS | 2 refills | Status: AC | PRN

## 2023-04-02 NOTE — Addendum Note (Signed)
 Addended by: Carola Rhine on: 04/02/2023 02:07 PM   Modules accepted: Orders

## 2023-04-02 NOTE — Progress Notes (Signed)
   Subjective:    Patient ID: Megan Crane, female    DOB: [birth year], 73 y.o.   MRN: [id number]  HPI Here to recheck an itchy rash on the lower abdomen. She was seen here recently and given Nystatin powder. This did not help, so we sent in Fluconazole. This helps but the rash is not quite gone. She also expresses concern about possible diabetes. She has a family hx of this, and she says over the past few weeks she feels weak after she eats anything sweet. Her last A1c last July was 5.8%.    Review of Systems  Constitutional: Negative.  Negative for activity change, appetite change, diaphoresis, fatigue, fever and unexpected weight change.  HENT: Negative.  Negative for congestion, ear pain, hearing loss, nosebleeds, sore throat, tinnitus, trouble swallowing and voice change.   Eyes: Negative.  Negative for photophobia, pain, discharge, redness and visual disturbance.  Respiratory: Negative.  Negative for apnea, cough, choking, chest tightness, shortness of breath, wheezing and stridor.   Cardiovascular: Negative.  Negative for chest pain, palpitations and leg swelling.  Gastrointestinal: Negative.  Negative for abdominal distention, abdominal pain, blood in stool, constipation, diarrhea, nausea, rectal pain and vomiting.  Genitourinary: Negative.  Negative for difficulty urinating, dysuria, enuresis, flank pain, frequency, hematuria, menstrual problem, urgency, vaginal bleeding, vaginal discharge and vaginal pain.  Musculoskeletal: Negative.  Negative for arthralgias, back pain, gait problem, joint swelling, myalgias, neck pain and neck stiffness.  Skin:  Positive for rash. Negative for color change, pallor and wound.  Neurological: Negative.  Negative for dizziness, tremors, seizures, syncope, speech difficulty, weakness, light-headedness, numbness and headaches.  Hematological:  Negative for adenopathy. Does not bruise/bleed easily.  Psychiatric/Behavioral: Negative.  Negative for agitation,  behavioral problems, confusion, dysphoric mood, hallucinations and sleep disturbance. The patient is not nervous/anxious.        Objective:   Physical Exam Constitutional:      Appearance: She is obese.  Cardiovascular:     Rate and Rhythm: Normal rate and regular rhythm.     Pulses: Normal pulses.     Heart sounds: Normal heart sounds.  Pulmonary:     Effort: Pulmonary effort is normal.     Breath sounds: Normal breath sounds.  Skin:    Comments: There is a thin line of erythema across the lower abdomen at the crease of the pannus   Neurological:     Mental Status: She is alert.           Assessment & Plan:  She has a Candidiasis rash so she will treat with a combination of Fluconazole pils and Ketoconazole cream. Her prediabetes is stable.  Gershon Crane, MD

## 2023-05-08 ENCOUNTER — Ambulatory Visit: Admitting: Family Medicine

## 2023-05-10 DIAGNOSIS — L649 Androgenic alopecia, unspecified: Secondary | ICD-10-CM | POA: Diagnosis not present

## 2023-05-10 DIAGNOSIS — L918 Other hypertrophic disorders of the skin: Secondary | ICD-10-CM | POA: Diagnosis not present

## 2023-05-10 DIAGNOSIS — Z85828 Personal history of other malignant neoplasm of skin: Secondary | ICD-10-CM | POA: Diagnosis not present

## 2023-05-10 DIAGNOSIS — L570 Actinic keratosis: Secondary | ICD-10-CM | POA: Diagnosis not present

## 2023-05-10 DIAGNOSIS — L738 Other specified follicular disorders: Secondary | ICD-10-CM | POA: Diagnosis not present

## 2023-05-22 ENCOUNTER — Telehealth: Payer: Self-pay

## 2023-05-22 NOTE — Telephone Encounter (Signed)
 Left message for pt to call the office back regarding to multiple appointment scheduled with Dr Alyne Babinski

## 2023-05-28 ENCOUNTER — Ambulatory Visit: Admitting: Family Medicine

## 2023-05-28 ENCOUNTER — Encounter: Payer: Self-pay | Admitting: Family Medicine

## 2023-05-28 VITALS — BP 94/60 | HR 88 | Temp 98.2°F | Ht 66.5 in | Wt 217.0 lb

## 2023-05-28 DIAGNOSIS — R739 Hyperglycemia, unspecified: Secondary | ICD-10-CM

## 2023-05-28 DIAGNOSIS — I10 Essential (primary) hypertension: Secondary | ICD-10-CM

## 2023-05-28 DIAGNOSIS — E538 Deficiency of other specified B group vitamins: Secondary | ICD-10-CM

## 2023-05-28 DIAGNOSIS — R509 Fever, unspecified: Secondary | ICD-10-CM | POA: Diagnosis not present

## 2023-05-28 DIAGNOSIS — J069 Acute upper respiratory infection, unspecified: Secondary | ICD-10-CM | POA: Diagnosis not present

## 2023-05-28 DIAGNOSIS — R109 Unspecified abdominal pain: Secondary | ICD-10-CM | POA: Diagnosis not present

## 2023-05-28 DIAGNOSIS — J029 Acute pharyngitis, unspecified: Secondary | ICD-10-CM | POA: Diagnosis not present

## 2023-05-28 LAB — POCT URINALYSIS DIPSTICK
Bilirubin, UA: NEGATIVE
Blood, UA: NEGATIVE
Glucose, UA: NEGATIVE
Ketones, UA: NEGATIVE
Nitrite, UA: NEGATIVE
Protein, UA: NEGATIVE
Spec Grav, UA: 1.01 (ref 1.010–1.025)
Urobilinogen, UA: 0.2 U/dL
pH, UA: 6 (ref 5.0–8.0)

## 2023-05-28 LAB — CBC WITH DIFFERENTIAL/PLATELET
Basophils Absolute: 0 10*3/uL (ref 0.0–0.1)
Basophils Relative: 0.4 % (ref 0.0–3.0)
Eosinophils Absolute: 0 10*3/uL (ref 0.0–0.7)
Eosinophils Relative: 0.4 % (ref 0.0–5.0)
HCT: 39.5 % (ref 36.0–46.0)
Hemoglobin: 13.2 g/dL (ref 12.0–15.0)
Lymphocytes Relative: 17.4 % (ref 12.0–46.0)
Lymphs Abs: 1.5 10*3/uL (ref 0.7–4.0)
MCHC: 33.5 g/dL (ref 30.0–36.0)
MCV: 88.7 fl (ref 78.0–100.0)
Monocytes Absolute: 0.9 10*3/uL (ref 0.1–1.0)
Monocytes Relative: 10.2 % (ref 3.0–12.0)
Neutro Abs: 6.1 10*3/uL (ref 1.4–7.7)
Neutrophils Relative %: 71.6 % (ref 43.0–77.0)
Platelets: 185 10*3/uL (ref 150.0–400.0)
RBC: 4.45 Mil/uL (ref 3.87–5.11)
RDW: 14.3 % (ref 11.5–15.5)
WBC: 8.5 10*3/uL (ref 4.0–10.5)

## 2023-05-28 LAB — POCT INFLUENZA A/B
Influenza A, POC: NEGATIVE
Influenza B, POC: NEGATIVE

## 2023-05-28 LAB — BASIC METABOLIC PANEL WITH GFR
BUN: 20 mg/dL (ref 6–23)
CO2: 30 meq/L (ref 19–32)
Calcium: 10.4 mg/dL (ref 8.4–10.5)
Chloride: 101 meq/L (ref 96–112)
Creatinine, Ser: 0.83 mg/dL (ref 0.40–1.20)
GFR: 71.33 mL/min (ref >60.00)
Glucose, Bld: 86 mg/dL (ref 70–99)
Potassium: 4.1 meq/L (ref 3.5–5.1)
Sodium: 137 meq/L (ref 135–145)

## 2023-05-28 LAB — POC COVID19 BINAXNOW: SARS Coronavirus 2 Ag: NEGATIVE

## 2023-05-28 LAB — HEPATIC FUNCTION PANEL
ALT: 16 U/L (ref 0–35)
AST: 19 U/L (ref 0–37)
Albumin: 4.3 g/dL (ref 3.5–5.2)
Alkaline Phosphatase: 72 U/L (ref 39–117)
Bilirubin, Direct: 0.2 mg/dL (ref 0.0–0.3)
Total Bilirubin: 0.8 mg/dL (ref 0.2–1.2)
Total Protein: 7.7 g/dL (ref 6.0–8.3)

## 2023-05-28 LAB — POCT RAPID STREP A (OFFICE): Rapid Strep A Screen: NEGATIVE

## 2023-05-28 LAB — HEMOGLOBIN A1C: Hgb A1c MFr Bld: 5.7 % (ref 4.6–6.5)

## 2023-05-28 NOTE — Progress Notes (Signed)
   Subjective:    Patient ID: Megan Crane, female    DOB: 06-Jul-1950, 73 y.o.   MRN: [id number]  HPI Here for several issues. First she wants to check if she has a UTI or not. She has had some lower abdominal discomfort for several days, but she denies any urinary urgency or burning. No fever. She also describes several days of mild body aches, ST, dr cough, and loose stools. She is drinking plenty of fluids. Finally she asks for some lab testing to see if she could be having any type of reaction to being around some feral cats that she has taken into her home recently.    Review of Systems  Constitutional: Negative.   HENT:  Positive for congestion and sore throat. Negative for ear pain, postnasal drip and sinus pressure.   Eyes: Negative.   Respiratory:  Positive for cough. Negative for chest tightness, shortness of breath and wheezing.   Cardiovascular: Negative.   Gastrointestinal:  Positive for abdominal pain. Negative for abdominal distention, blood in stool, constipation, diarrhea, nausea and vomiting.  Genitourinary: Negative.        Objective:   Physical Exam Constitutional:      Appearance: Normal appearance. She is well-developed.  HENT:     Right Ear: Tympanic membrane, ear canal and external ear normal.     Left Ear: Tympanic membrane, ear canal and external ear normal.     Nose: Nose normal.     Mouth/Throat:     Pharynx: Oropharynx is clear.  Eyes:     Conjunctiva/sclera: Conjunctivae normal.  Cardiovascular:     Rate and Rhythm: Normal rate and regular rhythm.     Pulses: Normal pulses.     Heart sounds: Normal heart sounds.  Pulmonary:     Effort: Pulmonary effort is normal.     Breath sounds: Normal breath sounds.  Abdominal:     General: Abdomen is flat. Bowel sounds are normal. There is no distension.     Palpations: Abdomen is soft. There is no mass.     Tenderness: There is no abdominal tenderness. There is no right CVA tenderness, left CVA  tenderness, guarding or rebound.     Hernia: No hernia is present.  Lymphadenopathy:     Cervical: No cervical adenopathy.  Neurological:     Mental Status: She is alert.           Assessment & Plan:  She has a mild viral URI, and this should resolve in the next few days. Since she has had some exposure to feral cats, we will get labs today including a CBC. Her UA was positive so we will culture the urine sample. We spent a total of ( 33  ) minutes reviewing records and discussing these issues.  Megan Diego, MD

## 2023-05-29 ENCOUNTER — Telehealth: Payer: Self-pay | Admitting: *Deleted

## 2023-05-29 LAB — VITAMIN B12: Vitamin B-12: 320 pg/mL (ref 211–911)

## 2023-05-29 LAB — TSH: TSH: 1.33 u[IU]/mL (ref 0.35–5.50)

## 2023-05-29 NOTE — Telephone Encounter (Signed)
 Copied from CRM [id number]. Topic: Clinical - Medical Advice >> May 29, 2023  9:56 AM Martinique E wrote: Reason for CRM: Patient called in stating that her PCP wanted to know if she is having any urinary symptoms in regards to a urine culture that was done. Patient stated she is having to get up multiple times during the night to go to the bathroom and is having an "achy" feeling in her pelvic region. Denied burning and itching. Did not want nurse triage, just wanted to get the message over to her PCP. Callback number for patient is [phone].

## 2023-05-29 NOTE — Telephone Encounter (Signed)
 FYI

## 2023-05-30 ENCOUNTER — Ambulatory Visit: Payer: Self-pay | Admitting: Family Medicine

## 2023-05-30 LAB — URINE CULTURE
MICRO NUMBER:: 16472438
SPECIMEN QUALITY:: ADEQUATE

## 2023-05-31 ENCOUNTER — Telehealth: Payer: Self-pay | Admitting: *Deleted

## 2023-05-31 MED ORDER — CEPHALEXIN 500 MG PO CAPS: 500.0000 mg | ORAL_CAPSULE | Freq: Three times a day (TID) | ORAL | 0 refills | Status: DC

## 2023-05-31 NOTE — Telephone Encounter (Signed)
 Copied from CRM [id number]. Topic: General - Other >> May 31, 2023  2:29 PM Howard Macho wrote: Reason for CRM: patient returning a call to Nancyann Aye at the office. Patient will call back at 430 if she does not hear anything back CB [phone]

## 2023-05-31 NOTE — Telephone Encounter (Signed)
 See results note.

## 2023-06-13 ENCOUNTER — Encounter: Payer: Self-pay | Admitting: Family Medicine

## 2023-06-13 ENCOUNTER — Ambulatory Visit (INDEPENDENT_AMBULATORY_CARE_PROVIDER_SITE_OTHER): Admitting: Family Medicine

## 2023-06-13 VITALS — BP 110/70 | HR 91 | Temp 99.0°F | Wt 217.0 lb

## 2023-06-13 DIAGNOSIS — K5732 Diverticulitis of large intestine without perforation or abscess without bleeding: Secondary | ICD-10-CM | POA: Diagnosis not present

## 2023-06-13 DIAGNOSIS — D171 Benign lipomatous neoplasm of skin and subcutaneous tissue of trunk: Secondary | ICD-10-CM

## 2023-06-13 MED ORDER — LORAZEPAM 0.5 MG PO TABS: 0.5000 mg | ORAL_TABLET | Freq: Three times a day (TID) | ORAL | 5 refills | Status: AC | PRN

## 2023-06-13 MED ORDER — AMOXICILLIN-POT CLAVULANATE 875-125 MG PO TABS: 1.0000 | ORAL_TABLET | Freq: Two times a day (BID) | ORAL | 0 refills | Status: DC

## 2023-06-13 NOTE — Progress Notes (Signed)
   Subjective:    Patient ID: Megan Crane, female    DOB: 1950-03-19, 73 y.o.   MRN: [id number]  HPI Here for 2 issues. First about one week ago she noticed a tender lump under the skin on the RUQ of her abdomen. Then 4 days ago she developed abdominal bloating with pressure under her ribs and pressure in her rectum. No fever. No nausea or vomiting. Her stools have been softer than usual, but she denies diarrhea. No bloody or black stools. Of note her colonoscopy in April 2023 demonstrated diffuse diverticulosis throughout the entire colon. She also had some colitis in the rectosigmoid area.    Review of Systems  Constitutional: Negative.   Respiratory: Negative.    Cardiovascular: Negative.   Gastrointestinal:  Positive for abdominal distention. Negative for abdominal pain, anal bleeding, blood in stool, constipation, diarrhea, nausea, rectal pain and vomiting.  Genitourinary: Negative.        Objective:   Physical Exam Constitutional:      Appearance: Normal appearance. She is not ill-appearing.  Cardiovascular:     Rate and Rhythm: Normal rate and regular rhythm.     Pulses: Normal pulses.     Heart sounds: Normal heart sounds.  Pulmonary:     Effort: Pulmonary effort is normal.     Breath sounds: Normal breath sounds.  Abdominal:     General: Bowel sounds are normal. There is no distension.     Palpations: Abdomen is soft.     Tenderness: There is no right CVA tenderness, left CVA tenderness, guarding or rebound.     Hernia: No hernia is present.     Comments: She is tender in the LLQ  Skin:    Comments: There is a small mobile firm non-tender mass just beneath the skin inferior to the right ribs   Neurological:     Mental Status: She is alert.           Assessment & Plan:  Diverticulitis. Treat with 10 days of Augmentin . The lump is a benign lipoma which we will monitor.  Corita Diego, MD

## 2023-06-27 ENCOUNTER — Other Ambulatory Visit: Payer: Self-pay

## 2023-06-27 ENCOUNTER — Ambulatory Visit: Payer: Self-pay

## 2023-06-27 ENCOUNTER — Telehealth: Payer: Self-pay | Admitting: *Deleted

## 2023-06-27 DIAGNOSIS — I10 Essential (primary) hypertension: Secondary | ICD-10-CM

## 2023-06-27 MED ORDER — VALSARTAN 40 MG PO TABS: 40.0000 mg | ORAL_TABLET | Freq: Every day | ORAL | 0 refills | Status: DC

## 2023-06-27 MED ORDER — HYDROCHLOROTHIAZIDE 25 MG PO TABS: 25.0000 mg | ORAL_TABLET | Freq: Every day | ORAL | 0 refills | Status: DC

## 2023-06-27 NOTE — Telephone Encounter (Signed)
  FYI Only or Action Required?: Action required by provider  Patient was last seen in primary care on 06/13/2023 by Donley Furth, MD. Called Nurse Triage reporting antibiotic follow up. Symptoms began today. Interventions attempted: Prescription medications: augmentin . Symptoms are: completely resolved.  Triage Disposition: Home Care  Patient/caregiver understands and will follow disposition?: YesCopied from CRM [id number]. Topic: Clinical - Medical Advice >> Jun 27, 2023 10:54 AM [place] A wrote: Reason for CRM: Patient would like call back from Nurse regarding amoxicillin -clavulanate (AUGMENTIN ) 875-125 MG tablet since she is going to [place]  for 20 days Reason for Disposition  [1] Taking antibiotic AND [2] symptoms are BETTER AND [3] no fever  Answer Assessment - Initial Assessment Questions 1. INFECTION: What infection is the antibiotic being given for?     diverticulitis 2. ANTIBIOTIC: What antibiotic are you taking How many times per day?     Augmentin  875-125 5. BETTER-SAME-WORSE: Are you getting better, staying the same, or getting worse compared to when you first started the antibiotics? If getting worse, ask: In what way?      Patient states that she is feeling much better 6. FEVER: Do you have a fever? If Yes, ask: What is your temperature, how was it measured, and when did it start?     denies 7. SYMPTOMS: Are there any other symptoms you're concerned about? If Yes, ask: When did it start?     denies   Will send refill request for following medications: Hydrochlorothiazide  Valsartan  Patient leaving for [place]  for 20 days leaving on July 7. She will finish her current RX for Augmentin  on 06/29/2023 She is also interested in having CT scan/diagnostic exam to assess diverticulitis when she returns in late July  Protocols used: Infection on Antibiotic Follow-up Call-A-AH

## 2023-06-27 NOTE — Telephone Encounter (Signed)
 Rx done.

## 2023-06-27 NOTE — Telephone Encounter (Signed)
 Noted

## 2023-06-28 ENCOUNTER — Other Ambulatory Visit: Payer: Self-pay | Admitting: Family Medicine

## 2023-07-04 ENCOUNTER — Ambulatory Visit: Admitting: Family Medicine

## 2023-07-24 ENCOUNTER — Ambulatory Visit: Admitting: Family Medicine

## 2023-07-24 ENCOUNTER — Encounter: Payer: Self-pay | Admitting: Family Medicine

## 2023-07-24 VITALS — BP 118/76 | HR 99 | Temp 99.0°F | Wt 214.0 lb

## 2023-07-24 DIAGNOSIS — R059 Cough, unspecified: Secondary | ICD-10-CM | POA: Diagnosis not present

## 2023-07-24 DIAGNOSIS — J40 Bronchitis, not specified as acute or chronic: Secondary | ICD-10-CM

## 2023-07-24 DIAGNOSIS — J029 Acute pharyngitis, unspecified: Secondary | ICD-10-CM | POA: Diagnosis not present

## 2023-07-24 DIAGNOSIS — M1711 Unilateral primary osteoarthritis, right knee: Secondary | ICD-10-CM | POA: Diagnosis not present

## 2023-07-24 LAB — POC COVID19 BINAXNOW: SARS Coronavirus 2 Ag: NEGATIVE

## 2023-07-24 LAB — POCT RAPID STREP A (OFFICE): Rapid Strep A Screen: NEGATIVE

## 2023-07-24 MED ORDER — AMOXICILLIN-POT CLAVULANATE 875-125 MG PO TABS: 1.0000 | ORAL_TABLET | Freq: Two times a day (BID) | ORAL | 0 refills | Status: DC

## 2023-07-24 NOTE — Progress Notes (Signed)
   Subjective:    Patient ID: Megan Crane, female    DOB: 1950/03/21, 73 y.o.   MRN: [id number]  HPI Here for 3 days of ST, PND, chest congestion, and coughing up yellow sputum. No fever or SOB.    Review of Systems  Constitutional: Negative.   HENT:  Positive for congestion, postnasal drip and sore throat. Negative for ear pain and sinus pressure.   Eyes: Negative.   Respiratory:  Positive for cough and chest tightness. Negative for shortness of breath.        Objective:   Physical Exam Constitutional:      Appearance: Normal appearance.  Cardiovascular:     Rate and Rhythm: Normal rate and regular rhythm.     Pulses: Normal pulses.     Heart sounds: Normal heart sounds.  Pulmonary:     Effort: Pulmonary effort is normal.     Breath sounds: Rhonchi present. No wheezing or rales.  Neurological:     Mental Status: She is alert.           Assessment & Plan:  Bronchitis, treat with 10 days of Augmentin .  Garnette Olmsted, MD

## 2023-07-24 NOTE — Addendum Note (Signed)
 Addended by: LADONNA INOCENTE SAILOR on: 07/24/2023 04:44 PM   Modules accepted: Orders

## 2023-08-01 ENCOUNTER — Ambulatory Visit: Payer: Self-pay

## 2023-08-01 ENCOUNTER — Ambulatory Visit (INDEPENDENT_AMBULATORY_CARE_PROVIDER_SITE_OTHER): Admitting: Family Medicine

## 2023-08-01 ENCOUNTER — Encounter: Payer: Self-pay | Admitting: Family Medicine

## 2023-08-01 VITALS — BP 110/70 | HR 82 | Temp 98.4°F | Wt 214.0 lb

## 2023-08-01 DIAGNOSIS — J40 Bronchitis, not specified as acute or chronic: Secondary | ICD-10-CM | POA: Diagnosis not present

## 2023-08-01 MED ORDER — DOXYCYCLINE HYCLATE 100 MG PO TABS: 100.0000 mg | ORAL_TABLET | Freq: Two times a day (BID) | ORAL | 0 refills | Status: DC

## 2023-08-01 NOTE — Telephone Encounter (Signed)
 APPOINTMENT TODAY 08/01/2023 AT 2 PM WITH DR GARNETTE OLMSTED   FYI Only or Action Required?: FYI only for provider.  Patient was last seen in primary care on 07/24/2023 by OLMSTED GARNETTE LABOR, MD.  Called Nurse Triage reporting Cough.  Symptoms began PRIOR TO 07/24/2023.  Interventions attempted: OTC medications: ROBITUSSIN and Prescription medications: ANTIBIOTIC.  Symptoms are: unchanged.  Triage Disposition: See Physician Within 24 Hours  Patient/caregiver understands and will follow disposition?: Yes                            Copied from CRM [id number]. Topic: Clinical - Red Word Triage >> Aug 01, 2023 11:24 AM Megan Crane wrote: Red Word that prompted transfer to Nurse Triage: Coughing, hoarseness, still experiencing drainage from bronchitis, pain and twinges from coughing Reason for Disposition  [1] Continuous (nonstop) coughing interferes with work or school AND [2] no improvement using cough treatment per Care Advice  Answer Assessment - Initial Assessment Questions Patient was seen by her PCP Dr GARNETTE OLMSTED on 07/24/2023 Patient has been taking her antibiotic and using over the counter Robitussin Patient states she is not worse but she just is not better and it has been a week She is advised that if anything gets worse to go to the Emergency Room She verbalized understanding     1. ONSET: When did the cough begin?      Since before last visit 2. SEVERITY: How bad is the cough today?      Patient states still not better 3. SPUTUM: Describe the color of your sputum (e.g., none, dry cough; clear, white, yellow, green)     N/A 4. HEMOPTYSIS: Are you coughing up any blood? If Yes, ask: How much? (e.g., flecks, streaks, tablespoons, etc.)     No 5. DIFFICULTY BREATHING: Are you having difficulty breathing? If Yes, ask: How bad is it? (e.g., mild, moderate, severe)      No 6. FEVER: Do you have a fever? If Yes, ask: What is your temperature, how  was it measured, and when did it start?     No 7. CARDIAC HISTORY: Do you have any history of heart disease? (e.g., heart attack, congestive heart failure)      ----- 8. LUNG HISTORY: Do you have any history of lung disease?  (e.g., pulmonary embolus, asthma, emphysema)     Recent diagnosis of bronchitis 9. PE RISK FACTORS: Do you have a history of blood clots? (or: recent major surgery, recent prolonged travel, bedridden)     ---- 10. OTHER SYMPTOMS: Do you have any other symptoms? (e.g., runny nose, wheezing, chest pain)       Sinus drainage  Protocols used: Cough - Acute Non-Productive-A-AH

## 2023-08-01 NOTE — Progress Notes (Signed)
   Subjective:    Patient ID: Megan Crane, female    DOB: 01-05-1951, 73 y.o.   MRN: [id number]  HPI Here with continued symptoms of bronchitis. We saw her on 07-24-23 for this, and she was given 10 days of Augmentin . She says she feels better overall, but the chest congestion and dry coughing persist. No fever or SOB. Using Robitussin DM.   Review of Systems  Constitutional: Negative.   HENT:  Positive for congestion and postnasal drip. Negative for ear pain, sinus pain and sore throat.   Eyes: Negative.   Respiratory:  Positive for cough. Negative for shortness of breath and wheezing.        Objective:   Physical Exam Constitutional:      Appearance: Normal appearance.  HENT:     Right Ear: Tympanic membrane, ear canal and external ear normal.     Left Ear: Tympanic membrane, ear canal and external ear normal.     Nose: Nose normal.     Mouth/Throat:     Pharynx: Oropharynx is clear.  Eyes:     Conjunctiva/sclera: Conjunctivae normal.  Pulmonary:     Effort: Pulmonary effort is normal.     Breath sounds: Normal breath sounds.  Lymphadenopathy:     Cervical: No cervical adenopathy.  Neurological:     Mental Status: She is alert.           Assessment & Plan:  Partially treated bronchitis. We will give her 10 days of Doxycycline . Recheck as needed.  Garnette Olmsted, MD

## 2023-08-02 NOTE — Telephone Encounter (Signed)
 Noted

## 2023-08-09 ENCOUNTER — Other Ambulatory Visit: Payer: Self-pay

## 2023-08-09 ENCOUNTER — Ambulatory Visit: Attending: Orthopedic Surgery

## 2023-08-09 DIAGNOSIS — M25661 Stiffness of right knee, not elsewhere classified: Secondary | ICD-10-CM | POA: Diagnosis not present

## 2023-08-09 DIAGNOSIS — R262 Difficulty in walking, not elsewhere classified: Secondary | ICD-10-CM | POA: Insufficient documentation

## 2023-08-09 DIAGNOSIS — M6281 Muscle weakness (generalized): Secondary | ICD-10-CM | POA: Insufficient documentation

## 2023-08-09 NOTE — Therapy (Signed)
 OUTPATIENT PHYSICAL THERAPY LOWER EXTREMITY EVALUATION   Patient Name: Megan Crane MRN: [id number] DOB:[birth year], 73 y.o., female Today's Date: 08/09/2023  END OF SESSION:  PT End of Session - 08/09/23 0952     Visit Number 1    Date for PT Re-Evaluation 10/04/23    Progress Note Due on Visit 10    PT Start Time 0940    PT Stop Time 1015    PT Time Calculation (min) 35 min    Activity Tolerance Patient tolerated treatment well    Behavior During Therapy [facility] for tasks assessed/performed          Past Medical History:  Diagnosis Date   Anxiety disorder    hx vasovagal responses   Arthritis    Cancer (HCC)    Deep vein thrombosis (DVT) (HCC)    hx of in left left after covid in 2021   Headache(784.0)    History of kidney stones    Hydronephrosis, right    Hyperlipidemia    Hypertension    Nephrolithiasis    Pneumonia    Positive nasal culture for methicillin resistant Staphylococcus aureus    Right ureteral stone    Thrombophlebitis of leg, left, superficial 11/17/2020   Urinary incontinence    sees Dr. McDiarmid    Vasovagal syncope 08/02/2022   Past Surgical History:  Procedure Laterality Date   BLADDER REPAIR     COLONOSCOPY  04/26/2021   per Dr. Leigh, diffuse diverticulosis, rectosigmoid colitis with narrowed lumen, repeat in 5 yrs   CYSTOSCOPY  06/09/2011   Procedure: CYSTOSCOPY;  Surgeon: Glendia DELENA Elizabeth, MD;  Location: [facility];  Service: Urology;  Laterality: N/A;   CYSTOSCOPY W/ URETERAL STENT PLACEMENT  02/03/2014   Procedure: CYSTOSCOPY WITH  RIGHT RETROGRADE PYELOGRAM/ RIGHT URETERAL STENT PLACEMENT;  Surgeon: Norleen JINNY Seltzer, MD;  Location: [facility];  Service: Urology;;   CYSTOSCOPY WITH BIOPSY  02/03/2014   Procedure: CYSTOSCOPY WITH BIOPSY;  Surgeon: Norleen JINNY Seltzer, MD;  Location: [facility];  Service: Urology;;   PARTIAL KNEE ARTHROPLASTY Right 09/18/2022   Procedure: UNICOMPARTMENTAL KNEE;  Surgeon:  Edna Toribio DELENA, MD;  Location: [facility];  Service: Orthopedics;  Laterality: Right;   PILONIDAL CYST EXCISION  1974   RECTOCELE REPAIR  06/09/2011   Procedure: POSTERIOR REPAIR (RECTOCELE);  Surgeon: Glendia DELENA Elizabeth, MD;  Location: [facility];  Service: Urology;  Laterality: N/A;  Xenform graft 6x10   RIGHT URETEROSCOPIC STONE EXTRACTION / STENT PLACEMENT  03/13/2000   URETEROSCOPY Right 02/03/2014   Procedure: URETEROSCOPY WITH MANAGEMENT OF URETERAL STRICTURE;  Surgeon: Norleen JINNY Seltzer, MD;  Location[facility];  Service: Urology;  Laterality: Right;   VAGINAL HYSTERECTOMY  06/09/2011   Procedure: HYSTERECTOMY VAGINAL;  Surgeon: Charlie JONETTA Aho, MD;  Location: [facility];  Service: Gynecology;  Laterality: N/A;   VAGINAL PROLAPSE REPAIR  06/09/2011   Procedure: VAGINAL VAULT SUSPENSION;  Surgeon: Glendia DELENA Elizabeth, MD;  Location: [facility];  Service: Urology;  Laterality: N/A;   VEIN LIGATION AND STRIPPING     LEFT LEG   Patient Active Problem List   Diagnosis Date Noted   S/P right unicompartmental knee replacement 09/18/2022   IC (interstitial cystitis) 09/04/2022   Pelvic pressure in female 08/22/2022   Visit for pre-operative examination 08/07/2022   Vasovagal syncope 08/02/2022   Nasal dryness 06/30/2022   Precancerous skin lesion 06/30/2022   Bilateral carpal tunnel syndrome 03/02/2022   Swelling of both hands 03/02/2022  Atypical chest pain 03/02/2022   OAB (overactive bladder) 11/23/2021   Corn of foot 11/23/2021   Atrophic vaginitis 11/23/2021   COVID-19 virus infection 11/17/2020   Raynaud's phenomenon without gangrene 11/17/2020   Primary osteoarthritis involving multiple joints 11/24/2019   GERD (gastroesophageal reflux disease) 12/26/2016   Edema 05/29/2014   Nasal colonization with methicillin-resistant Staphylococcus aureus 01/22/2014   Varicose veins of bilateral lower extremities with other complications 12/11/2012   Allergic rhinitis 11/29/2009    MOTION SICKNESS 11/29/2009   Hyperlipidemia 08/31/2008   Anxiety state 08/31/2008   Primary hypertension 08/31/2008   PILONIDAL CYST 08/31/2008   HEADACHE 08/31/2008   URINARY INCONTINENCE 08/31/2008   NEPHROLITHIASIS, HX OF 08/31/2008   POSTNASAL DRIP SYNDROME 05/28/2006    PCP: Johnny Senior, MD   REFERRING PROVIDER: Edna Sieving, MD  REFERRING DIAG: R IT  band syndrome  THERAPY DIAG:  Difficulty in walking, not elsewhere classified  Muscle weakness (generalized)  Stiffness of right knee, not elsewhere classified  Rationale for Evaluation and Treatment: Rehabilitation  ONSET DATE: 06/29/23  SUBJECTIVE:   SUBJECTIVE STATEMENT: Fell in hallway, dog had accident , L foot slipped out, landed on R lat knee with knee bent.  Saw orthopedist who did the partial knee replacement last year and they referred me here.  Then I had diverticulitis, then bronchitis, so I've been really weak and my immunity/ stamina has been depleted.  Noticing R ant knee and lower leg pulls, aches when I bend it a lot.  More prevalent problem currently is my stamina  PERTINENT HISTORY: Referred by orthopedist PAIN:  Are you having pain? Reports mild pain R post knee and ant lower leg, knee with specific positions, no interference with sleep or most functional activities  PRECAUTIONS: None  RED FLAGS: None   WEIGHT BEARING RESTRICTIONS: No  FALLS:  Has patient fallen in last 6 months? Yes. Number of falls 1  LIVING ENVIRONMENT: Lives with: lives with their family and lives with their spouse Lives in: House/apartment Stairs: No Has following equipment at home: Single point cane  OCCUPATION: retired  PLOF: Independent  PATIENT GOALS: return to normal with endurance, activity tolerance  NEXT MD VISIT: unclear  OBJECTIVE:  Note: Objective measures were completed at Evaluation unless otherwise noted.  DIAGNOSTIC FINDINGS: orthopedist radiology results wnl  PATIENT SURVEYS:  PSFS:  THE PATIENT SPECIFIC FUNCTIONAL SCALE  Place score of 0-10 (0 = unable to perform activity and 10 = able to perform activity at the same level as before injury or problem)  Activity Date: 08/09/23    Getting out of car and standing up 5    2.  Walking through grocery store 5    3.  Prolonged sitting  8    4.      Total Score 18      Total Score = Sum of activity scores/number of activities  Minimally Detectable Change: 3 points (for single activity); 2 points (for average score)  [facility] (nd). The Patient Specific Functional Scale . Retrieved from [email or web address]   COGNITION: Overall cognitive status: Within functional limits for tasks assessed     SENSATION: WFL  EDEMA:  Mild visible distal R thigh  MUSCLE LENGTH: Hamstrings: Right wnl deg; Left wnl deg Debby test: Right nt deg; Left nt deg  POSTURE: anterior pelvic tilt and R knee flexion 10 degrees in standing  PALPATION: Mild tenderness over lateral trochanter R hip and R IT band  LOWER EXTREMITY ROM:  AA ROM Right eval  Left eval  Hip flexion    Hip extension    Hip abduction    Hip adduction    Hip internal rotation    Hip external rotation    Knee flexion 116   Knee extension -8   Ankle dorsiflexion    Ankle plantarflexion    Ankle inversion    Ankle eversion     (Blank rows = not tested)  LOWER EXTREMITY MMT:  MMT Right eval Left eval  Hip flexion    Hip extension    Hip abduction 4-   Hip adduction    Hip internal rotation    Hip external rotation 5   Knee flexion    Knee extension    Ankle dorsiflexion    Ankle plantarflexion 4-   Ankle inversion    Ankle eversion     (Blank rows = not tested)  LOWER EXTREMITY SPECIAL TESTS:  Hip special tests: Belvie (FABER) test: negative  FUNCTIONAL TESTS:  6 minute walk test: 4 min 24 sec 30 sec sit to stand 13 reps GAIT: Distance walked: 600' Assistive device  utilized: None Level of assistance: Modified independence Comments: gait pattern with + trendelenburg R, increased hip sway B, tends to scissor at time with gait.                                                                                                                                TREATMENT DATE: 08/09/23  Evaluation, instructed in seated  or long sitting R ankle pumps with black theraband resistance 15 reps, 1 x day, education throughout in evaluation findings and gait, improving gait efficiency with reducing postural. Pelvic sway   PATIENT EDUCATION:  Education details: POC, goals Person educated: Patient Education method: Explanation, Demonstration, Tactile cues, Verbal cues, and Handouts Education comprehension: verbalized understanding  HOME EXERCISE PROGRAM: TBD  ASSESSMENT:  CLINICAL IMPRESSION: Patient is a 73 y.o. female who was evaluated today by physical therapy  for recovery from a recent fall with IT band strain. Her pain has improved quite a bit, did find today weakness R plantarflexors and R hip abductors, and some loss of extension Rom R knee as compared to her final PT visit at the end of 2024 as she was recovering from unilateral knee arthroplasty.  Her endurance is compromised, unable to complete 6 min walk test.  Also she has unsteady gait with marked postural sway, has additional history of falls prior to her R knee surgery.  Would benefit from physical therapy intervention to address her deficits and assist her with improving her balance and gait efficiency.    OBJECTIVE IMPAIRMENTS: decreased activity tolerance, decreased balance, decreased endurance, decreased knowledge of condition, difficulty walking, decreased ROM, decreased strength, and decreased safety awareness.   ACTIVITY LIMITATIONS: carrying, lifting, stairs, locomotion level, and caring for others  PARTICIPATION LIMITATIONS: cleaning, laundry, shopping, community activity, and yard  work  PERSONAL FACTORS: Age, Behavior pattern, Fitness, Past/current experiences, Time  since onset of injury/illness/exacerbation, and 1-2 comorbidities: recent partial R knee replacement are also affecting patient's functional outcome.   REHAB POTENTIAL: Good  CLINICAL DECISION MAKING: Evolving/moderate complexity  EVALUATION COMPLEXITY: Moderate   GOALS: Goals reviewed with patient? Yes  SHORT TERM GOALS: Target date: 2 weeks 08/23/23 I HEP Baseline: Goal status: INITIAL   LONG TERM GOALS: Target date: 8 weeks 10/04/23  Strength R hip abd and plantarflexion improve to 5/5 from 4-/5 for increased stability with gait pattern Baseline:  Goal status: INITIAL  2.  Complete 6 min walk test, walking greater than 800'  Baseline: 4 min 24 sec and 600'  Goal status: INITIAL  3.  Improve R knee extension ROM to -5 or better from -11 for more efficient gait pattern Baseline:  Goal status: INITIAL  4.  30 sec sit to stand improve from 13 reps to 16 Baseline:  Goal status: INITIAL     PLAN:  PT FREQUENCY: 2x/week  PT DURATION: 8 weeks  PLANNED INTERVENTIONS: 97110-Therapeutic exercises, 97530- Therapeutic activity, 97112- Neuromuscular re-education, 97535- Self Care, and [id number]- Manual therapy  PLAN FOR NEXT SESSION: strengthening for R LE plantarflexors and hip abductors, balance and pre gait, gait activities to focus on reducing R hip trendelenburg   Lilliann Rossetti L Rylee Huestis, PT, DPT, OCS 08/09/2023, 1:55 PM

## 2023-08-14 ENCOUNTER — Ambulatory Visit: Attending: Orthopedic Surgery

## 2023-08-14 DIAGNOSIS — R2689 Other abnormalities of gait and mobility: Secondary | ICD-10-CM | POA: Insufficient documentation

## 2023-08-14 DIAGNOSIS — R262 Difficulty in walking, not elsewhere classified: Secondary | ICD-10-CM | POA: Insufficient documentation

## 2023-08-14 DIAGNOSIS — R600 Localized edema: Secondary | ICD-10-CM | POA: Diagnosis not present

## 2023-08-14 DIAGNOSIS — M25661 Stiffness of right knee, not elsewhere classified: Secondary | ICD-10-CM | POA: Diagnosis not present

## 2023-08-14 DIAGNOSIS — M6281 Muscle weakness (generalized): Secondary | ICD-10-CM | POA: Diagnosis not present

## 2023-08-14 NOTE — Therapy (Signed)
 OUTPATIENT PHYSICAL THERAPY LOWER EXTREMITY EVALUATION   Patient Name: Megan Crane MRN: [id number] DOB:August 31, 1950, 72 y.o., female Today's Date: 08/15/2023  END OF SESSION:  PT End of Session - 08/15/23 1044     Visit Number 2    Date for PT Re-Evaluation 10/04/23    Progress Note Due on Visit 10    PT Start Time 1535    PT Stop Time 1618    PT Time Calculation (min) 43 min    Activity Tolerance Patient tolerated treatment well    Behavior During Therapy WFL for tasks assessed/performed           Past Medical History:  Diagnosis Date   Anxiety disorder    hx vasovagal responses   Arthritis    Cancer (HCC)    Deep vein thrombosis (DVT) (HCC)    hx of in left left after covid in 2021   Headache(784.0)    History of kidney stones    Hydronephrosis, right    Hyperlipidemia    Hypertension    Nephrolithiasis    Pneumonia    Positive nasal culture for methicillin resistant Staphylococcus aureus    Right ureteral stone    Thrombophlebitis of leg, left, superficial 11/17/2020   Urinary incontinence    sees Dr. McDiarmid    Vasovagal syncope 08/02/2022   Past Surgical History:  Procedure Laterality Date   BLADDER REPAIR     COLONOSCOPY  04/26/2021   per Dr. Leigh, diffuse diverticulosis, rectosigmoid colitis with narrowed lumen, repeat in 5 yrs   CYSTOSCOPY  06/09/2011   Procedure: CYSTOSCOPY;  Surgeon: Glendia DELENA Elizabeth, MD;  Location: [facility];  Service: Urology;  Laterality: N/A;   CYSTOSCOPY W/ URETERAL STENT PLACEMENT  02/03/2014   Procedure: CYSTOSCOPY WITH  RIGHT RETROGRADE PYELOGRAM/ RIGHT URETERAL STENT PLACEMENT;  Surgeon: Norleen JINNY Seltzer, MD;  Location: [facility];  Service: Urology;;   CYSTOSCOPY WITH BIOPSY  02/03/2014   Procedure: CYSTOSCOPY WITH BIOPSY;  Surgeon: Norleen JINNY Seltzer, MD;  Location: [facility];  Service: Urology;;   PARTIAL KNEE ARTHROPLASTY Right 09/18/2022   Procedure: UNICOMPARTMENTAL KNEE;  Surgeon:  Edna Toribio DELENA, MD;  Location: [facility];  Service: Orthopedics;  Laterality: Right;   PILONIDAL CYST EXCISION  1974   RECTOCELE REPAIR  06/09/2011   Procedure: POSTERIOR REPAIR (RECTOCELE);  Surgeon: Glendia DELENA Elizabeth, MD;  Location: [facility];  Service: Urology;  Laterality: N/A;  Xenform graft 6x10   RIGHT URETEROSCOPIC STONE EXTRACTION / STENT PLACEMENT  03/13/2000   URETEROSCOPY Right 02/03/2014   Procedure: URETEROSCOPY WITH MANAGEMENT OF URETERAL STRICTURE;  Surgeon: Norleen JINNY Seltzer, MD;  Location: [facility];  Service: Urology;  Laterality: Right;   VAGINAL HYSTERECTOMY  06/09/2011   Procedure: HYSTERECTOMY VAGINAL;  Surgeon: Charlie JONETTA Aho, MD;  Location: [facility];  Service: Gynecology;  Laterality: N/A;   VAGINAL PROLAPSE REPAIR  06/09/2011   Procedure: VAGINAL VAULT SUSPENSION;  Surgeon: Glendia DELENA Elizabeth, MD;  Location: [facility];  Service: Urology;  Laterality: N/A;   VEIN LIGATION AND STRIPPING     LEFT LEG   Patient Active Problem List   Diagnosis Date Noted   S/P right unicompartmental knee replacement 09/18/2022   IC (interstitial cystitis) 09/04/2022   Pelvic pressure in female 08/22/2022   Visit for pre-operative examination 08/07/2022   Vasovagal syncope 08/02/2022   Nasal dryness 06/30/2022   Precancerous skin lesion 06/30/2022   Bilateral carpal tunnel syndrome 03/02/2022   Swelling of both hands  03/02/2022   Atypical chest pain 03/02/2022   OAB (overactive bladder) 11/23/2021   Corn of foot 11/23/2021   Atrophic vaginitis 11/23/2021   COVID-19 virus infection 11/17/2020   Raynaud's phenomenon without gangrene 11/17/2020   Primary osteoarthritis involving multiple joints 11/24/2019   GERD (gastroesophageal reflux disease) 12/26/2016   Edema 05/29/2014   Nasal colonization with methicillin-resistant Staphylococcus aureus 01/22/2014   Varicose veins of bilateral lower extremities with other complications 12/11/2012   Allergic rhinitis 11/29/2009    MOTION SICKNESS 11/29/2009   Hyperlipidemia 08/31/2008   Anxiety state 08/31/2008   Primary hypertension 08/31/2008   PILONIDAL CYST 08/31/2008   HEADACHE 08/31/2008   URINARY INCONTINENCE 08/31/2008   NEPHROLITHIASIS, HX OF 08/31/2008   POSTNASAL DRIP SYNDROME 05/28/2006    PCP: Johnny Senior, MD   REFERRING PROVIDER: Edna Sieving, MD  REFERRING DIAG: R IT  band syndrome  THERAPY DIAG:  Difficulty in walking, not elsewhere classified  Muscle weakness (generalized)  Stiffness of right knee, not elsewhere classified  Rationale for Evaluation and Treatment: Rehabilitation  ONSET DATE: 06/29/23  SUBJECTIVE:   SUBJECTIVE STATEMENT:08/15/23:  I feel like my swelling has gone down some R knee, still hurts if I bend R knee too much.  Still get tired if I walk too far, I can feel my R hip jutting out  Eval:Fell in hallway, dog had accident , L foot slipped out, landed on R lat knee with knee bent.  Saw orthopedist who did the partial knee replacement last year and they referred me here.  Then I had diverticulitis, then bronchitis, so I've been really weak and my immunity/ stamina has been depleted.  Noticing R ant knee and lower leg pulls, aches when I bend it a lot.  More prevalent problem currently is my stamina  PERTINENT HISTORY: Referred by orthopedist PAIN:  Are you having pain? Reports mild pain R post knee and ant lower leg, knee with specific positions, no interference with sleep or most functional activities  PRECAUTIONS: None  RED FLAGS: None   WEIGHT BEARING RESTRICTIONS: No  FALLS:  Has patient fallen in last 6 months? Yes. Number of falls 1  LIVING ENVIRONMENT: Lives with: lives with their family and lives with their spouse Lives in: House/apartment Stairs: No Has following equipment at home: Single point cane  OCCUPATION: retired  PLOF: Independent  PATIENT GOALS: return to normal with endurance, activity tolerance  NEXT MD VISIT:  unclear  OBJECTIVE:  Note: Objective measures were completed at Evaluation unless otherwise noted.  DIAGNOSTIC FINDINGS: orthopedist radiology results wnl  PATIENT SURVEYS:  PSFS: THE PATIENT SPECIFIC FUNCTIONAL SCALE  Place score of 0-10 (0 = unable to perform activity and 10 = able to perform activity at the same level as before injury or problem)  Activity Date: 08/09/23    Getting out of car and standing up 5    2.  Walking through grocery store 5    3.  Prolonged sitting  8    4.      Total Score 18      Total Score = Sum of activity scores/number of activities  Minimally Detectable Change: 3 points (for single activity); 2 points (for average score)  [facility] (nd). The Patient Specific Functional Scale . Retrieved from [email or web address]   COGNITION: Overall cognitive status: Within functional limits for tasks assessed     SENSATION: WFL  EDEMA:  Mild visible distal R thigh  MUSCLE LENGTH: Hamstrings: Right wnl deg; Left wnl deg Debby  test: Right nt deg; Left nt deg  POSTURE: anterior pelvic tilt and R knee flexion 10 degrees in standing  PALPATION: Mild tenderness over lateral trochanter R hip and R IT band  LOWER EXTREMITY ROM:  AA ROM Right eval Left eval  Hip flexion    Hip extension    Hip abduction    Hip adduction    Hip internal rotation    Hip external rotation    Knee flexion 116   Knee extension -8   Ankle dorsiflexion    Ankle plantarflexion    Ankle inversion    Ankle eversion     (Blank rows = not tested)  LOWER EXTREMITY MMT:  MMT Right eval Left eval  Hip flexion    Hip extension    Hip abduction 4-   Hip adduction    Hip internal rotation    Hip external rotation 5   Knee flexion    Knee extension    Ankle dorsiflexion    Ankle plantarflexion 4-   Ankle inversion    Ankle eversion     (Blank rows = not tested)  LOWER EXTREMITY SPECIAL TESTS:   Hip special tests: Belvie (FABER) test: negative  FUNCTIONAL TESTS:  6 minute walk test: 4 min 24 sec 30 sec sit to stand 13 reps GAIT: Distance walked: 600' Assistive device utilized: None Level of assistance: Modified independence Comments: gait pattern with + trendelenburg R, increased hip sway B, tends to scissor at time with gait.                                                                                                                                TREATMENT DATE:08/14/23 Therapuaetic activities Added blue t band around SI to replicate SI belt and support glut medius B, gait with the belt with some improvement in gait pattern , less sway R pelvis, but pt reported fatgue R hip abductors so removed Insert 1/8 L shoe for symmetrical iliac crest height B, gait with insert with definite improvement with R lateral pelvic shift in wbing.  Advised to trial for 24 to 48 hrs and self assess.  Manual: Side lying L for cross friction massage primarily R TFL musculature, using theragun Supine for patellar mobs R , also cross fricoitn massage med and lat knee jt lines , pt with some tenderness there  Therex:  Instructed in bridging B, 15 x with sustained  abduction hips, with black t band around knees  Hooklying hip abd with black t band around knees  15 x Provided with printed hand outs from [employer] to follow on these 2 ex.  Nustep Level 4  Ue's and LE's, x 3 min 30 sec at end of session for endurance.   08/09/23  Evaluation, instructed in seated  or long sitting R ankle pumps with black theraband resistance 15 reps, 1 x day, education throughout in evaluation findings and gait, improving gait efficiency with reducing postural.  Pelvic sway   PATIENT EDUCATION:  Education details: POC, goals Person educated: Patient Education method: Explanation, Demonstration, Tactile cues, Verbal cues, and Handouts Education comprehension: verbalized understanding  HOME EXERCISE  PROGRAM: TBD  ASSESSMENT:  CLINICAL IMPRESSION: 08/14/23:patient attended second physical therapy session following her evaluation due to soft tissue injury R knee following a fall. Treatment today addressed R lateral hip strength and stability, utilized 1/8  lift L shoe to improve posture and added strengthening specific for hip abductors.  She tolerated well .  To attend one more session then will be out of town for a week.   Eval:Patient is a 73 y.o. female who was evaluated today by physical therapy  for recovery from a recent fall with IT band strain. Her pain has improved quite a bit, did find today weakness R plantarflexors and R hip abductors, and some loss of extension Rom R knee as compared to her final PT visit at the end of 2024 as she was recovering from unilateral knee arthroplasty.  Her endurance is compromised, unable to complete 6 min walk test.  Also she has unsteady gait with marked postural sway, has additional history of falls prior to her R knee surgery.  Would benefit from physical therapy intervention to address her deficits and assist her with improving her balance and gait efficiency.    OBJECTIVE IMPAIRMENTS: decreased activity tolerance, decreased balance, decreased endurance, decreased knowledge of condition, difficulty walking, decreased ROM, decreased strength, and decreased safety awareness.   ACTIVITY LIMITATIONS: carrying, lifting, stairs, locomotion level, and caring for others  PARTICIPATION LIMITATIONS: cleaning, laundry, shopping, community activity, and yard work  PERSONAL FACTORS: Age, Behavior pattern, Fitness, Past/current experiences, Time since onset of injury/illness/exacerbation, and 1-2 comorbidities: recent partial R knee replacement are also affecting patient's functional outcome.   REHAB POTENTIAL: Good  CLINICAL DECISION MAKING: Evolving/moderate complexity  EVALUATION COMPLEXITY: Moderate   GOALS: Goals reviewed with patient? Yes  SHORT  TERM GOALS: Target date: 2 weeks 08/23/23 I HEP Baseline: Goal status: INITIAL   LONG TERM GOALS: Target date: 8 weeks 10/04/23  Strength R hip abd and plantarflexion improve to 5/5 from 4-/5 for increased stability with gait pattern Baseline:  Goal status: INITIAL  2.  Complete 6 min walk test, walking greater than 800'  Baseline: 4 min 24 sec and 600'  Goal status: INITIAL  3.  Improve R knee extension ROM to -5 or better from -11 for more efficient gait pattern Baseline:  Goal status: INITIAL  4.  30 sec sit to stand improve from 13 reps to 16 Baseline:  Goal status: INITIAL     PLAN:  PT FREQUENCY: 2x/week  PT DURATION: 8 weeks  PLANNED INTERVENTIONS: 97110-Therapeutic exercises, 97530- Therapeutic activity, 97112- Neuromuscular re-education, 97535- Self Care, and [id number]- Manual therapy  PLAN FOR NEXT SESSION: strengthening for R LE plantarflexors and hip abductors, balance and pre gait, gait activities to focus on reducing R hip trendelenburg   Abrian Hanover L Hartley Urton, PT, DPT, OCS 08/15/2023, 10:56 AM

## 2023-08-15 ENCOUNTER — Ambulatory Visit

## 2023-08-15 ENCOUNTER — Other Ambulatory Visit: Payer: Self-pay

## 2023-08-16 ENCOUNTER — Ambulatory Visit

## 2023-08-16 DIAGNOSIS — M25661 Stiffness of right knee, not elsewhere classified: Secondary | ICD-10-CM

## 2023-08-16 DIAGNOSIS — M6281 Muscle weakness (generalized): Secondary | ICD-10-CM | POA: Diagnosis not present

## 2023-08-16 DIAGNOSIS — R600 Localized edema: Secondary | ICD-10-CM | POA: Diagnosis not present

## 2023-08-16 DIAGNOSIS — R2689 Other abnormalities of gait and mobility: Secondary | ICD-10-CM

## 2023-08-16 DIAGNOSIS — R262 Difficulty in walking, not elsewhere classified: Secondary | ICD-10-CM

## 2023-08-16 NOTE — Therapy (Signed)
 OUTPATIENT PHYSICAL THERAPY LOWER EXTREMITY TREATMENT   Patient Name: Megan Crane MRN: [id number] DOB:February 09, 1950, 73 y.o., female Today's Date: 08/16/2023  END OF SESSION:  PT End of Session - 08/16/23 1030     Visit Number 3    Date for PT Re-Evaluation 10/04/23    Progress Note Due on Visit 10    PT Start Time 0930    PT Stop Time 1015    PT Time Calculation (min) 45 min    Activity Tolerance Patient tolerated treatment well    Behavior During Therapy [facility] for tasks assessed/performed            Past Medical History:  Diagnosis Date   Anxiety disorder    hx vasovagal responses   Arthritis    Cancer (HCC)    Deep vein thrombosis (DVT) (HCC)    hx of in left left after covid in 2021   Headache(784.0)    History of kidney stones    Hydronephrosis, right    Hyperlipidemia    Hypertension    Nephrolithiasis    Pneumonia    Positive nasal culture for methicillin resistant Staphylococcus aureus    Right ureteral stone    Thrombophlebitis of leg, left, superficial 11/17/2020   Urinary incontinence    sees Dr. McDiarmid    Vasovagal syncope 08/02/2022   Past Surgical History:  Procedure Laterality Date   BLADDER REPAIR     COLONOSCOPY  04/26/2021   per Dr. Leigh, diffuse diverticulosis, rectosigmoid colitis with narrowed lumen, repeat in 5 yrs   CYSTOSCOPY  06/09/2011   Procedure: CYSTOSCOPY;  Surgeon: Glendia DELENA Elizabeth, MD;  Location: [facility];  Service: Urology;  Laterality: N/A;   CYSTOSCOPY W/ URETERAL STENT PLACEMENT  02/03/2014   Procedure: CYSTOSCOPY WITH  RIGHT RETROGRADE PYELOGRAM/ RIGHT URETERAL STENT PLACEMENT;  Surgeon: Norleen JINNY Seltzer, MD;  Location: [facility];  Service: Urology;;   CYSTOSCOPY WITH BIOPSY  02/03/2014   Procedure: CYSTOSCOPY WITH BIOPSY;  Surgeon: Norleen JINNY Seltzer, MD;  Location: [facility];  Service: Urology;;   PARTIAL KNEE ARTHROPLASTY Right 09/18/2022   Procedure: UNICOMPARTMENTAL KNEE;  Surgeon:  Edna Toribio DELENA, MD;  Location: [facility];  Service: Orthopedics;  Laterality: Right;   PILONIDAL CYST EXCISION  1974   RECTOCELE REPAIR  06/09/2011   Procedure: POSTERIOR REPAIR (RECTOCELE);  Surgeon: Glendia DELENA Elizabeth, MD;  Location: [facility];  Service: Urology;  Laterality: N/A;  Xenform graft 6x10   RIGHT URETEROSCOPIC STONE EXTRACTION / STENT PLACEMENT  03/13/2000   URETEROSCOPY Right 02/03/2014   Procedure: URETEROSCOPY WITH MANAGEMENT OF URETERAL STRICTURE;  Surgeon: Norleen JINNY Seltzer, MD;  Location: [facility];  Service: Urology;  Laterality: Right;   VAGINAL HYSTERECTOMY  06/09/2011   Procedure: HYSTERECTOMY VAGINAL;  Surgeon: Charlie JONETTA Aho, MD;  Location: [facility];  Service: Gynecology;  Laterality: N/A;   VAGINAL PROLAPSE REPAIR  06/09/2011   Procedure: VAGINAL VAULT SUSPENSION;  Surgeon: Glendia DELENA Elizabeth, MD;  Location: [facility];  Service: Urology;  Laterality: N/A;   VEIN LIGATION AND STRIPPING     LEFT LEG   Patient Active Problem List   Diagnosis Date Noted   S/P right unicompartmental knee replacement 09/18/2022   IC (interstitial cystitis) 09/04/2022   Pelvic pressure in female 08/22/2022   Visit for pre-operative examination 08/07/2022   Vasovagal syncope 08/02/2022   Nasal dryness 06/30/2022   Precancerous skin lesion 06/30/2022   Bilateral carpal tunnel syndrome 03/02/2022   Swelling of both  hands 03/02/2022   Atypical chest pain 03/02/2022   OAB (overactive bladder) 11/23/2021   Corn of foot 11/23/2021   Atrophic vaginitis 11/23/2021   COVID-19 virus infection 11/17/2020   Raynaud's phenomenon without gangrene 11/17/2020   Primary osteoarthritis involving multiple joints 11/24/2019   GERD (gastroesophageal reflux disease) 12/26/2016   Edema 05/29/2014   Nasal colonization with methicillin-resistant Staphylococcus aureus 01/22/2014   Varicose veins of bilateral lower extremities with other complications 12/11/2012   Allergic rhinitis 11/29/2009    MOTION SICKNESS 11/29/2009   Hyperlipidemia 08/31/2008   Anxiety state 08/31/2008   Primary hypertension 08/31/2008   PILONIDAL CYST 08/31/2008   HEADACHE 08/31/2008   URINARY INCONTINENCE 08/31/2008   NEPHROLITHIASIS, HX OF 08/31/2008   POSTNASAL DRIP SYNDROME 05/28/2006    PCP: Johnny Senior, MD   REFERRING PROVIDER: Edna Sieving, MD  REFERRING DIAG: R IT  band syndrome  THERAPY DIAG:  Difficulty in walking, not elsewhere classified  Muscle weakness (generalized)  Stiffness of right knee, not elsewhere classified  Other abnormalities of gait and mobility  Localized edema  Rationale for Evaluation and Treatment: Rehabilitation  ONSET DATE: 06/29/23  SUBJECTIVE:   SUBJECTIVE STATEMENT:08/16/23:  I think the insert in my L shoe is making a difference, I feel like I'm not jutting my R hip out so much and not swiveling so much when I walk.    Eval:Fell in hallway, dog had accident , L foot slipped out, landed on R lat knee with knee bent.  Saw orthopedist who did the partial knee replacement last year and they referred me here.  Then I had diverticulitis, then bronchitis, so I've been really weak and my immunity/ stamina has been depleted.  Noticing R ant knee and lower leg pulls, aches when I bend it a lot.  More prevalent problem currently is my stamina  PERTINENT HISTORY: Referred by orthopedist PAIN:  Are you having pain? Reports mild pain R post knee and ant lower leg, knee with specific positions, no interference with sleep or most functional activities  PRECAUTIONS: None  RED FLAGS: None   WEIGHT BEARING RESTRICTIONS: No  FALLS:  Has patient fallen in last 6 months? Yes. Number of falls 1  LIVING ENVIRONMENT: Lives with: lives with their family and lives with their spouse Lives in: House/apartment Stairs: No Has following equipment at home: Single point cane  OCCUPATION: retired  PLOF: Independent  PATIENT GOALS: return to normal with endurance,  activity tolerance  NEXT MD VISIT: unclear  OBJECTIVE:  Note: Objective measures were completed at Evaluation unless otherwise noted.  DIAGNOSTIC FINDINGS: orthopedist radiology results wnl  PATIENT SURVEYS:  PSFS: THE PATIENT SPECIFIC FUNCTIONAL SCALE  Place score of 0-10 (0 = unable to perform activity and 10 = able to perform activity at the same level as before injury or problem)  Activity Date: 08/09/23    Getting out of car and standing up 5    2.  Walking through grocery store 5    3.  Prolonged sitting  8    4.      Total Score 18      Total Score = Sum of activity scores/number of activities  Minimally Detectable Change: 3 points (for single activity); 2 points (for average score)  [facility] (nd). The Patient Specific Functional Scale . Retrieved from [email or web address]   COGNITION: Overall cognitive status: Within functional limits for tasks assessed     SENSATION: WFL  EDEMA:  Mild visible distal R thigh  MUSCLE LENGTH:  Hamstrings: Right wnl deg; Left wnl deg Debby test: Right nt deg; Left nt deg  POSTURE: anterior pelvic tilt and R knee flexion 10 degrees in standing  PALPATION: Mild tenderness over lateral trochanter R hip and R IT band  LOWER EXTREMITY ROM:  AA ROM Right eval Left eval  Hip flexion    Hip extension    Hip abduction    Hip adduction    Hip internal rotation    Hip external rotation    Knee flexion 116   Knee extension -8   Ankle dorsiflexion    Ankle plantarflexion    Ankle inversion    Ankle eversion     (Blank rows = not tested)  LOWER EXTREMITY MMT:  MMT Right eval Left eval  Hip flexion    Hip extension    Hip abduction 4-   Hip adduction    Hip internal rotation    Hip external rotation 5   Knee flexion    Knee extension    Ankle dorsiflexion    Ankle plantarflexion 4-   Ankle inversion    Ankle eversion     (Blank rows = not  tested)  LOWER EXTREMITY SPECIAL TESTS:  Hip special tests: Belvie (FABER) test: negative  FUNCTIONAL TESTS:  6 minute walk test: 4 min 24 sec 30 sec sit to stand 13 reps GAIT: Distance walked: 600' Assistive device utilized: None Level of assistance: Modified independence Comments: gait pattern with + trendelenburg R, increased hip sway B, tends to scissor at time with gait.                                                                                                                                TREATMENT DATE: 08/16/23: Manual: side lying L for cross friction massage primarily R TFL musculature, using theragun Supine for manual stretching by therapist for R hamstrings, 3 bouts Supine for AP glides R tibia, with femur supported on bolster, with tibia adduction direction, 3 bouts, to assist with improving R knee flexion motion tolerance Supine for patellar mobs R  Provided with rubber insert for L shoe as the cork is breaking down but working well.  Therapeutic exercise:  Bridging B, 15 x with sustained  abduction hips, with black t band around knees , cues to maintain tension for abduction Hooklying hip abd with black t band around knees 15x, needed cuing for placement Nustep level 5 x 6 min to address  activity tolerance/ endurance  08/14/23 Therapuaetic activities Added blue t band around SI to replicate SI belt and support glut medius B, gait with the belt with some improvement in gait pattern , less sway R pelvis, but pt reported fatgue R hip abductors so removed Insert 1/8 L shoe for symmetrical iliac crest height B, gait with insert with definite improvement with R lateral pelvic shift in wbing.  Advised to trial for 24 to 48 hrs and self assess.  Manual: Side  lying L for cross friction massage primarily R TFL musculature, using theragun Supine for patellar mobs R , also cross fricoitn massage med and lat knee jt lines , pt with some tenderness there  Therex:   Instructed in bridging B, 15 x with sustained  abduction hips, with black t band around knees  Hooklying hip abd with black t band around knees  15 x Provided with printed hand outs from [employer] to follow on these 2 ex.  Nustep Level 4  Ue's and LE's, x 3 min 30 sec at end of session for endurance.   08/09/23  Evaluation, instructed in seated  or long sitting R ankle pumps with black theraband resistance 15 reps, 1 x day, education throughout in evaluation findings and gait, improving gait efficiency with reducing postural. Pelvic sway   PATIENT EDUCATION:  Education details: POC, goals Person educated: Patient Education method: Explanation, Demonstration, Tactile cues, Verbal cues, and Handouts Education comprehension: verbalized understanding  HOME EXERCISE PROGRAM: TBD  ASSESSMENT:  CLINICAL IMPRESSION: 08/16/23:patient returned today for 3rd session and reports improved R hip function with her gait and stability.  Still feels overall discomfort R knee primarily medial and ant, more pronounced with terminal flexion.  Addressed patellar mobility and overall R knee jt mobility today with some improvement.  Reviewed her hip abductor strengthening ex.  She will be out of town next week and we will resume when she returns.    Eval:Patient is a 73 y.o. female who was evaluated today by physical therapy  for recovery from a recent fall with IT band strain. Her pain has improved quite a bit, did find today weakness R plantarflexors and R hip abductors, and some loss of extension Rom R knee as compared to her final PT visit at the end of 2024 as she was recovering from unilateral knee arthroplasty.  Her endurance is compromised, unable to complete 6 min walk test.  Also she has unsteady gait with marked postural sway, has additional history of falls prior to her R knee surgery.  Would benefit from physical therapy intervention to address her deficits and assist her with improving her balance and  gait efficiency.    OBJECTIVE IMPAIRMENTS: decreased activity tolerance, decreased balance, decreased endurance, decreased knowledge of condition, difficulty walking, decreased ROM, decreased strength, and decreased safety awareness.   ACTIVITY LIMITATIONS: carrying, lifting, stairs, locomotion level, and caring for others  PARTICIPATION LIMITATIONS: cleaning, laundry, shopping, community activity, and yard work  PERSONAL FACTORS: Age, Behavior pattern, Fitness, Past/current experiences, Time since onset of injury/illness/exacerbation, and 1-2 comorbidities: recent partial R knee replacement are also affecting patient's functional outcome.   REHAB POTENTIAL: Good  CLINICAL DECISION MAKING: Evolving/moderate complexity  EVALUATION COMPLEXITY: Moderate   GOALS: Goals reviewed with patient? Yes  SHORT TERM GOALS: Target date: 2 weeks 08/23/23 I HEP Baseline: Goal status: INITIAL   LONG TERM GOALS: Target date: 8 weeks 10/04/23  Strength R hip abd and plantarflexion improve to 5/5 from 4-/5 for increased stability with gait pattern Baseline:  Goal status: INITIAL  2.  Complete 6 min walk test, walking greater than 800'  Baseline: 4 min 24 sec and 600'  Goal status: INITIAL  3.  Improve R knee extension ROM to -5 or better from -11 for more efficient gait pattern Baseline:  Goal status: INITIAL  4.  30 sec sit to stand improve from 13 reps to 16 Baseline:  Goal status: INITIAL     PLAN:  PT FREQUENCY: 2x/week  PT DURATION:  8 weeks  PLANNED INTERVENTIONS: 97110-Therapeutic exercises, 97530- Therapeutic activity, [id number]- Neuromuscular re-education, 97535- Self Care, and [id number]- Manual therapy  PLAN FOR NEXT SESSION: strengthening for R LE plantarflexors and hip abductors, balance and pre gait, gait activities to focus on reducing R hip trendelenburg   Anjalee Cope L Irem Stoneham, PT, DPT, OCS 08/16/2023, 10:31 AM

## 2023-08-28 ENCOUNTER — Other Ambulatory Visit: Payer: Self-pay

## 2023-08-28 ENCOUNTER — Ambulatory Visit

## 2023-08-28 DIAGNOSIS — M6281 Muscle weakness (generalized): Secondary | ICD-10-CM

## 2023-08-28 DIAGNOSIS — M25661 Stiffness of right knee, not elsewhere classified: Secondary | ICD-10-CM

## 2023-08-28 DIAGNOSIS — R2689 Other abnormalities of gait and mobility: Secondary | ICD-10-CM

## 2023-08-28 DIAGNOSIS — R600 Localized edema: Secondary | ICD-10-CM | POA: Diagnosis not present

## 2023-08-28 DIAGNOSIS — R262 Difficulty in walking, not elsewhere classified: Secondary | ICD-10-CM

## 2023-08-28 NOTE — Therapy (Signed)
 OUTPATIENT PHYSICAL THERAPY LOWER EXTREMITY TREATMENT   Patient Name: Megan Crane MRN: [id number] DOB:Dec 07, 1950, 73 y.o., female Today's Date: 08/29/2023  END OF SESSION:  PT End of Session - 08/28/23 1209     Visit Number 4    Date for PT Re-Evaluation 10/04/23    Progress Note Due on Visit 10    PT Start Time 1016    PT Stop Time 1100    PT Time Calculation (min) 44 min    Activity Tolerance Patient tolerated treatment well    Behavior During Therapy WFL for tasks assessed/performed             Past Medical History:  Diagnosis Date   Anxiety disorder    hx vasovagal responses   Arthritis    Cancer (HCC)    Deep vein thrombosis (DVT) (HCC)    hx of in left left after covid in 2021   Headache(784.0)    History of kidney stones    Hydronephrosis, right    Hyperlipidemia    Hypertension    Nephrolithiasis    Pneumonia    Positive nasal culture for methicillin resistant Staphylococcus aureus    Right ureteral stone    Thrombophlebitis of leg, left, superficial 11/17/2020   Urinary incontinence    sees Dr. McDiarmid    Vasovagal syncope 08/02/2022   Past Surgical History:  Procedure Laterality Date   BLADDER REPAIR     COLONOSCOPY  04/26/2021   per Dr. Leigh, diffuse diverticulosis, rectosigmoid colitis with narrowed lumen, repeat in 5 yrs   CYSTOSCOPY  06/09/2011   Procedure: CYSTOSCOPY;  Surgeon: Glendia DELENA Elizabeth, MD;  Location: [facility];  Service: Urology;  Laterality: N/A;   CYSTOSCOPY W/ URETERAL STENT PLACEMENT  02/03/2014   Procedure: CYSTOSCOPY WITH  RIGHT RETROGRADE PYELOGRAM/ RIGHT URETERAL STENT PLACEMENT;  Surgeon: Norleen JINNY Seltzer, MD;  Location: [facility];  Service: Urology;;   CYSTOSCOPY WITH BIOPSY  02/03/2014   Procedure: CYSTOSCOPY WITH BIOPSY;  Surgeon: Norleen JINNY Seltzer, MD;  Location: [facility];  Service: Urology;;   PARTIAL KNEE ARTHROPLASTY Right 09/18/2022   Procedure: UNICOMPARTMENTAL KNEE;  Surgeon:  Edna Toribio DELENA, MD;  Location: [facility];  Service: Orthopedics;  Laterality: Right;   PILONIDAL CYST EXCISION  1974   RECTOCELE REPAIR  06/09/2011   Procedure: POSTERIOR REPAIR (RECTOCELE);  Surgeon: Glendia DELENA Elizabeth, MD;  Location: [facility];  Service: Urology;  Laterality: N/A;  Xenform graft 6x10   RIGHT URETEROSCOPIC STONE EXTRACTION / STENT PLACEMENT  03/13/2000   URETEROSCOPY Right 02/03/2014   Procedure: URETEROSCOPY WITH MANAGEMENT OF URETERAL STRICTURE;  Surgeon: Norleen JINNY Seltzer, MD;  Location: [facility];  Service: Urology;  Laterality: Right;   VAGINAL HYSTERECTOMY  06/09/2011   Procedure: HYSTERECTOMY VAGINAL;  Surgeon: Charlie JONETTA Aho, MD;  Location: [facility];  Service: Gynecology;  Laterality: N/A;   VAGINAL PROLAPSE REPAIR  06/09/2011   Procedure: VAGINAL VAULT SUSPENSION;  Surgeon: Glendia DELENA Elizabeth, MD;  Location: [facility];  Service: Urology;  Laterality: N/A;   VEIN LIGATION AND STRIPPING     LEFT LEG   Patient Active Problem List   Diagnosis Date Noted   S/P right unicompartmental knee replacement 09/18/2022   IC (interstitial cystitis) 09/04/2022   Pelvic pressure in female 08/22/2022   Visit for pre-operative examination 08/07/2022   Vasovagal syncope 08/02/2022   Nasal dryness 06/30/2022   Precancerous skin lesion 06/30/2022   Bilateral carpal tunnel syndrome 03/02/2022   Swelling of  both hands 03/02/2022   Atypical chest pain 03/02/2022   OAB (overactive bladder) 11/23/2021   Corn of foot 11/23/2021   Atrophic vaginitis 11/23/2021   COVID-19 virus infection 11/17/2020   Raynaud's phenomenon without gangrene 11/17/2020   Primary osteoarthritis involving multiple joints 11/24/2019   GERD (gastroesophageal reflux disease) 12/26/2016   Edema 05/29/2014   Nasal colonization with methicillin-resistant Staphylococcus aureus 01/22/2014   Varicose veins of bilateral lower extremities with other complications 12/11/2012   Allergic rhinitis 11/29/2009    MOTION SICKNESS 11/29/2009   Hyperlipidemia 08/31/2008   Anxiety state 08/31/2008   Primary hypertension 08/31/2008   PILONIDAL CYST 08/31/2008   HEADACHE 08/31/2008   URINARY INCONTINENCE 08/31/2008   NEPHROLITHIASIS, HX OF 08/31/2008   POSTNASAL DRIP SYNDROME 05/28/2006    PCP: Johnny Senior, MD   REFERRING PROVIDER: Edna Sieving, MD  REFERRING DIAG: R IT  band syndrome  THERAPY DIAG:  Muscle weakness (generalized)  Difficulty in walking, not elsewhere classified  Stiffness of right knee, not elsewhere classified  Other abnormalities of gait and mobility  Rationale for Evaluation and Treatment: Rehabilitation  ONSET DATE: 06/29/23  SUBJECTIVE:   SUBJECTIVE STATEMENT:08/28/23:  just returned from trip to [facility], I feel like the lift really makes a difference in my walking, but I left it home today , it is in the camper.  R knee not so tight feeling now.  I note that I feel insecure when I am going down steps from the camper Eval:Fell in hallway, dog had accident , L foot slipped out, landed on R lat knee with knee bent.  Saw orthopedist who did the partial knee replacement last year and they referred me here.  Then I had diverticulitis, then bronchitis, so I've been really weak and my immunity/ stamina has been depleted.  Noticing R ant knee and lower leg pulls, aches when I bend it a lot.  More prevalent problem currently is my stamina  PERTINENT HISTORY: Referred by orthopedist PAIN:  Are you having pain? Reports mild pain R post knee and ant lower leg, knee with specific positions, no interference with sleep or most functional activities  PRECAUTIONS: None  RED FLAGS: None   WEIGHT BEARING RESTRICTIONS: No  FALLS:  Has patient fallen in last 6 months? Yes. Number of falls 1  LIVING ENVIRONMENT: Lives with: lives with their family and lives with their spouse Lives in: House/apartment Stairs: No Has following equipment at home: Single point  cane  OCCUPATION: retired  PLOF: Independent  PATIENT GOALS: return to normal with endurance, activity tolerance  NEXT MD VISIT: unclear  OBJECTIVE:  Note: Objective measures were completed at Evaluation unless otherwise noted.  DIAGNOSTIC FINDINGS: orthopedist radiology results wnl  PATIENT SURVEYS:  PSFS: THE PATIENT SPECIFIC FUNCTIONAL SCALE  Place score of 0-10 (0 = unable to perform activity and 10 = able to perform activity at the same level as before injury or problem)  Activity Date: 08/09/23    Getting out of car and standing up 5    2.  Walking through grocery store 5    3.  Prolonged sitting  8    4.      Total Score 18      Total Score = Sum of activity scores/number of activities  Minimally Detectable Change: 3 points (for single activity); 2 points (for average score)  [facility] (nd). The Patient Specific Functional Scale . Retrieved from [email or web address]   COGNITION: Overall cognitive status: Within functional limits for tasks  assessed     SENSATION: WFL  EDEMA:  Mild visible distal R thigh  MUSCLE LENGTH: Hamstrings: Right wnl deg; Left wnl deg Debby test: Right nt deg; Left nt deg  POSTURE: anterior pelvic tilt and R knee flexion 10 degrees in standing  PALPATION: Mild tenderness over lateral trochanter R hip and R IT band  LOWER EXTREMITY ROM:  AA ROM Right eval Left eval  Hip flexion    Hip extension    Hip abduction    Hip adduction    Hip internal rotation    Hip external rotation    Knee flexion 116   Knee extension -8   Ankle dorsiflexion    Ankle plantarflexion    Ankle inversion    Ankle eversion     (Blank rows = not tested)  LOWER EXTREMITY MMT:  MMT Right eval Left eval  Hip flexion    Hip extension    Hip abduction 4-   Hip adduction    Hip internal rotation    Hip external rotation 5   Knee flexion    Knee extension    Ankle  dorsiflexion    Ankle plantarflexion 4-   Ankle inversion    Ankle eversion     (Blank rows = not tested)  LOWER EXTREMITY SPECIAL TESTS:  Hip special tests: Belvie (FABER) test: negative  FUNCTIONAL TESTS:  6 minute walk test: 4 min 24 sec 30 sec sit to stand 13 reps GAIT: Distance walked: 600' Assistive device utilized: None Level of assistance: Modified independence Comments: gait pattern with + trendelenburg R, increased hip sway B, tends to scissor at time with gait.                                                                                                                                TREATMENT DATE: 08/28/23: Nustep level 5 x  6 min UE's and LE's Side lying L for theragun massage R hip abductors Supine for distraction  R ankle with quad sets, 5 sec holds, x 10 reps  B knee ext 10#, con lift, eccentric lowering with R, x 10 reps x 3 B knee flex 25# CON lowering and eccentric raising with R LE Side lying for R hip clam shell with 3# cuff wt over R knee jt R hamstring stretch with therapist providing overpressure R post knee jt in supine Standing pillow case slides with L LE , R LE wbing to engage stability R lat hip musculature.   08/16/23: Manual: side lying L for cross friction massage primarily R TFL musculature, using theragun Supine for manual stretching by therapist for R hamstrings, 3 bouts Supine for AP glides R tibia, with femur supported on bolster, with tibia adduction direction, 3 bouts, to assist with improving R knee flexion motion tolerance Supine for patellar mobs R  Provided with rubber insert for L shoe as the cork is breaking down but working well.  Therapeutic exercise:  Bridging B, 15 x with sustained  abduction hips, with black t band around knees , cues to maintain tension for abduction Hooklying hip abd with black t band around knees 15x, needed cuing for placement Nustep level 5 x 6 min to address  activity tolerance/  endurance  08/14/23 Therapuaetic activities Added blue t band around SI to replicate SI belt and support glut medius B, gait with the belt with some improvement in gait pattern , less sway R pelvis, but pt reported fatgue R hip abductors so removed Insert 1/8 L shoe for symmetrical iliac crest height B, gait with insert with definite improvement with R lateral pelvic shift in wbing.  Advised to trial for 24 to 48 hrs and self assess.  Manual: Side lying L for cross friction massage primarily R TFL musculature, using theragun Supine for patellar mobs R , also cross fricoitn massage med and lat knee jt lines , pt with some tenderness there  Therex:  Instructed in bridging B, 15 x with sustained  abduction hips, with black t band around knees  Hooklying hip abd with black t band around knees  15 x Provided with printed hand outs from [employer] to follow on these 2 ex.  Nustep Level 4  Ue's and LE's, x 3 min 30 sec at end of session for endurance.   08/09/23  Evaluation, instructed in seated  or long sitting R ankle pumps with black theraband resistance 15 reps, 1 x day, education throughout in evaluation findings and gait, improving gait efficiency with reducing postural. Pelvic sway   PATIENT EDUCATION:  Education details: POC, goals Person educated: Patient Education method: Explanation, Demonstration, Tactile cues, Verbal cues, and Handouts Education comprehension: verbalized understanding  HOME EXERCISE PROGRAM: TBD  ASSESSMENT:  CLINICAL IMPRESSION: 08/28/23:patient returned today for 3rd session and reports improved R hip function with her gait and stability.  Her discomfort R ant and post knee with terminal flexion has resolved.  She did not have lift today and demonstrating pronounced R hip trendelenberg .  She is concerned about her stability with descending steps so focused some of today's session on eccentrics R quads and stability R hip  Eval:Patient is a 73 y.o. female who  was evaluated today by physical therapy  for recovery from a recent fall with IT band strain. Her pain has improved quite a bit, did find today weakness R plantarflexors and R hip abductors, and some loss of extension Rom R knee as compared to her final PT visit at the end of 2024 as she was recovering from unilateral knee arthroplasty.  Her endurance is compromised, unable to complete 6 min walk test.  Also she has unsteady gait with marked postural sway, has additional history of falls prior to her R knee surgery.  Would benefit from physical therapy intervention to address her deficits and assist her with improving her balance and gait efficiency.    OBJECTIVE IMPAIRMENTS: decreased activity tolerance, decreased balance, decreased endurance, decreased knowledge of condition, difficulty walking, decreased ROM, decreased strength, and decreased safety awareness.   ACTIVITY LIMITATIONS: carrying, lifting, stairs, locomotion level, and caring for others  PARTICIPATION LIMITATIONS: cleaning, laundry, shopping, community activity, and yard work  PERSONAL FACTORS: Age, Behavior pattern, Fitness, Past/current experiences, Time since onset of injury/illness/exacerbation, and 1-2 comorbidities: recent partial R knee replacement are also affecting patient's functional outcome.   REHAB POTENTIAL: Good  CLINICAL DECISION MAKING: Evolving/moderate complexity  EVALUATION COMPLEXITY: Moderate   GOALS: Goals reviewed with patient? Yes  SHORT  TERM GOALS: Target date: 2 weeks 08/23/23 I HEP Baseline: Goal status: INITIAL   LONG TERM GOALS: Target date: 8 weeks 10/04/23  Strength R hip abd and plantarflexion improve to 5/5 from 4-/5 for increased stability with gait pattern Baseline:  Goal status: INITIAL  2.  Complete 6 min walk test, walking greater than 800'  Baseline: 4 min 24 sec and 600'  Goal status: INITIAL  3.  Improve R knee extension ROM to -5 or better from -11 for more efficient gait  pattern Baseline:  Goal status: INITIAL  4.  30 sec sit to stand improve from 13 reps to 16 Baseline:  Goal status: 08/28/23; 14 reps improved      PLAN:  PT FREQUENCY: 2x/week  PT DURATION: 8 weeks  PLANNED INTERVENTIONS: 97110-Therapeutic exercises, 97530- Therapeutic activity, 97112- Neuromuscular re-education, 97535- Self Care, and [id number]- Manual therapy  PLAN FOR NEXT SESSION: strengthening for R LE plantarflexors and hip abductors, balance and pre gait, gait activities to focus on reducing R hip trendelenburg   Averey Trompeter L Saydi Kobel, PT, DPT, OCS 08/29/2023, 5:26 PM

## 2023-08-30 ENCOUNTER — Ambulatory Visit

## 2023-08-30 DIAGNOSIS — R2689 Other abnormalities of gait and mobility: Secondary | ICD-10-CM

## 2023-08-30 DIAGNOSIS — R600 Localized edema: Secondary | ICD-10-CM

## 2023-08-30 DIAGNOSIS — M25661 Stiffness of right knee, not elsewhere classified: Secondary | ICD-10-CM | POA: Diagnosis not present

## 2023-08-30 DIAGNOSIS — M6281 Muscle weakness (generalized): Secondary | ICD-10-CM | POA: Diagnosis not present

## 2023-08-30 DIAGNOSIS — R262 Difficulty in walking, not elsewhere classified: Secondary | ICD-10-CM

## 2023-08-30 NOTE — Therapy (Signed)
 OUTPATIENT PHYSICAL THERAPY LOWER EXTREMITY TREATMENT   Patient Name: Megan Crane MRN: [id number] DOB:June 20, 1950, 73 y.o., female Today's Date: 08/30/2023  END OF SESSION:  PT End of Session - 08/30/23 1139     Visit Number 5    Date for PT Re-Evaluation 10/04/23    Progress Note Due on Visit 10    PT Start Time 1104    PT Stop Time 1147    PT Time Calculation (min) 43 min    Activity Tolerance Patient tolerated treatment well    Behavior During Therapy WFL for tasks assessed/performed              Past Medical History:  Diagnosis Date   Anxiety disorder    hx vasovagal responses   Arthritis    Cancer (HCC)    Deep vein thrombosis (DVT) (HCC)    hx of in left left after covid in 2021   Headache(784.0)    History of kidney stones    Hydronephrosis, right    Hyperlipidemia    Hypertension    Nephrolithiasis    Pneumonia    Positive nasal culture for methicillin resistant Staphylococcus aureus    Right ureteral stone    Thrombophlebitis of leg, left, superficial 11/17/2020   Urinary incontinence    sees Dr. McDiarmid    Vasovagal syncope 08/02/2022   Past Surgical History:  Procedure Laterality Date   BLADDER REPAIR     COLONOSCOPY  04/26/2021   per Dr. Leigh, diffuse diverticulosis, rectosigmoid colitis with narrowed lumen, repeat in 5 yrs   CYSTOSCOPY  06/09/2011   Procedure: CYSTOSCOPY;  Surgeon: Glendia DELENA Elizabeth, MD;  Location: [facility];  Service: Urology;  Laterality: N/A;   CYSTOSCOPY W/ URETERAL STENT PLACEMENT  02/03/2014   Procedure: CYSTOSCOPY WITH  RIGHT RETROGRADE PYELOGRAM/ RIGHT URETERAL STENT PLACEMENT;  Surgeon: Norleen JINNY Seltzer, MD;  Location: [facility];  Service: Urology;;   CYSTOSCOPY WITH BIOPSY  02/03/2014   Procedure: CYSTOSCOPY WITH BIOPSY;  Surgeon: Norleen JINNY Seltzer, MD;  Location: [facility];  Service: Urology;;   PARTIAL KNEE ARTHROPLASTY Right 09/18/2022   Procedure: UNICOMPARTMENTAL KNEE;  Surgeon:  Edna Toribio DELENA, MD;  Location: [facility];  Service: Orthopedics;  Laterality: Right;   PILONIDAL CYST EXCISION  1974   RECTOCELE REPAIR  06/09/2011   Procedure: POSTERIOR REPAIR (RECTOCELE);  Surgeon: Glendia DELENA Elizabeth, MD;  Location: [facility];  Service: Urology;  Laterality: N/A;  Xenform graft 6x10   RIGHT URETEROSCOPIC STONE EXTRACTION / STENT PLACEMENT  03/13/2000   URETEROSCOPY Right 02/03/2014   Procedure: URETEROSCOPY WITH MANAGEMENT OF URETERAL STRICTURE;  Surgeon: Norleen JINNY Seltzer, MD;  Location: [facility];  Service: Urology;  Laterality: Right;   VAGINAL HYSTERECTOMY  06/09/2011   Procedure: HYSTERECTOMY VAGINAL;  Surgeon: Charlie JONETTA Aho, MD;  Location: [facility];  Service: Gynecology;  Laterality: N/A;   VAGINAL PROLAPSE REPAIR  06/09/2011   Procedure: VAGINAL VAULT SUSPENSION;  Surgeon: Glendia DELENA Elizabeth, MD;  Location: [facility];  Service: Urology;  Laterality: N/A;   VEIN LIGATION AND STRIPPING     LEFT LEG   Patient Active Problem List   Diagnosis Date Noted   S/P right unicompartmental knee replacement 09/18/2022   IC (interstitial cystitis) 09/04/2022   Pelvic pressure in female 08/22/2022   Visit for pre-operative examination 08/07/2022   Vasovagal syncope 08/02/2022   Nasal dryness 06/30/2022   Precancerous skin lesion 06/30/2022   Bilateral carpal tunnel syndrome 03/02/2022   Swelling  of both hands 03/02/2022   Atypical chest pain 03/02/2022   OAB (overactive bladder) 11/23/2021   Corn of foot 11/23/2021   Atrophic vaginitis 11/23/2021   COVID-19 virus infection 11/17/2020   Raynaud's phenomenon without gangrene 11/17/2020   Primary osteoarthritis involving multiple joints 11/24/2019   GERD (gastroesophageal reflux disease) 12/26/2016   Edema 05/29/2014   Nasal colonization with methicillin-resistant Staphylococcus aureus 01/22/2014   Varicose veins of bilateral lower extremities with other complications 12/11/2012   Allergic rhinitis 11/29/2009    MOTION SICKNESS 11/29/2009   Hyperlipidemia 08/31/2008   Anxiety state 08/31/2008   Primary hypertension 08/31/2008   PILONIDAL CYST 08/31/2008   HEADACHE 08/31/2008   URINARY INCONTINENCE 08/31/2008   NEPHROLITHIASIS, HX OF 08/31/2008   POSTNASAL DRIP SYNDROME 05/28/2006    PCP: Johnny Senior, MD   REFERRING PROVIDER: Edna Sieving, MD  REFERRING DIAG: R IT  band syndrome  THERAPY DIAG:  Muscle weakness (generalized)  Difficulty in walking, not elsewhere classified  Stiffness of right knee, not elsewhere classified  Other abnormalities of gait and mobility  Localized edema  Rationale for Evaluation and Treatment: Rehabilitation  ONSET DATE: 06/29/23  SUBJECTIVE:   SUBJECTIVE STATEMENT: Doing good today Eval:Fell in hallway, dog had accident , L foot slipped out, landed on R lat knee with knee bent.  Saw orthopedist who did the partial knee replacement last year and they referred me here.  Then I had diverticulitis, then bronchitis, so I've been really weak and my immunity/ stamina has been depleted.  Noticing R ant knee and lower leg pulls, aches when I bend it a lot.  More prevalent problem currently is my stamina  PERTINENT HISTORY: Referred by orthopedist PAIN:  Are you having pain? Reports mild pain R post knee and ant lower leg, knee with specific positions, no interference with sleep or most functional activities  PRECAUTIONS: None  RED FLAGS: None   WEIGHT BEARING RESTRICTIONS: No  FALLS:  Has patient fallen in last 6 months? Yes. Number of falls 1  LIVING ENVIRONMENT: Lives with: lives with their family and lives with their spouse Lives in: House/apartment Stairs: No Has following equipment at home: Single point cane  OCCUPATION: retired  PLOF: Independent  PATIENT GOALS: return to normal with endurance, activity tolerance  NEXT MD VISIT: unclear  OBJECTIVE:  Note: Objective measures were completed at Evaluation unless otherwise  noted.  DIAGNOSTIC FINDINGS: orthopedist radiology results wnl  PATIENT SURVEYS:  PSFS: THE PATIENT SPECIFIC FUNCTIONAL SCALE  Place score of 0-10 (0 = unable to perform activity and 10 = able to perform activity at the same level as before injury or problem)  Activity Date: 08/09/23    Getting out of car and standing up 5    2.  Walking through grocery store 5    3.  Prolonged sitting  8    4.      Total Score 18      Total Score = Sum of activity scores/number of activities  Minimally Detectable Change: 3 points (for single activity); 2 points (for average score)  [facility] (nd). The Patient Specific Functional Scale . Retrieved from [email or web address]   COGNITION: Overall cognitive status: Within functional limits for tasks assessed     SENSATION: WFL  EDEMA:  Mild visible distal R thigh  MUSCLE LENGTH: Hamstrings: Right wnl deg; Left wnl deg Debby test: Right nt deg; Left nt deg  POSTURE: anterior pelvic tilt and R knee flexion 10 degrees in standing  PALPATION: Mild  tenderness over lateral trochanter R hip and R IT band  LOWER EXTREMITY ROM:  AA ROM Right eval Left eval  Hip flexion    Hip extension    Hip abduction    Hip adduction    Hip internal rotation    Hip external rotation    Knee flexion 116   Knee extension -8   Ankle dorsiflexion    Ankle plantarflexion    Ankle inversion    Ankle eversion     (Blank rows = not tested)  LOWER EXTREMITY MMT:  MMT Right eval Left eval  Hip flexion    Hip extension    Hip abduction 4-   Hip adduction    Hip internal rotation    Hip external rotation 5   Knee flexion    Knee extension    Ankle dorsiflexion    Ankle plantarflexion 4-   Ankle inversion    Ankle eversion     (Blank rows = not tested)  LOWER EXTREMITY SPECIAL TESTS:  Hip special tests: Belvie (FABER) test: negative  FUNCTIONAL TESTS:  6 minute walk test: 4  min 24 sec 30 sec sit to stand 13 reps GAIT: Distance walked: 600' Assistive device utilized: None Level of assistance: Modified independence Comments: gait pattern with + trendelenburg R, increased hip sway B, tends to scissor at time with gait.                                                                                                                                TREATMENT DATE: 08/30/23: Nustep level 5 x  6 min UE's and LE's Side lying L for theragun massage R hip abductors, ITB Seated R HS stretch RLE x 30 B knee ext 15lb x 15 BLE; RLE 5lb x 15 Sidelying R hip abduction x 10  Standing hip abduction 3lb 2x10 BLE Standing HS curls 3lb 2x10 BLE Standing hip ext 3lb 2x10 BLE  08/28/23: Nustep level 5 x  6 min UE's and LE's Side lying L for theragun massage R hip abductors Supine for distraction  R ankle with quad sets, 5 sec holds, x 10 reps  B knee ext 10#, con lift, eccentric lowering with R, x 10 reps x 3 B knee flex 25# CON lowering and eccentric raising with R LE Side lying for R hip clam shell with 3# cuff wt over R knee jt R hamstring stretch with therapist providing overpressure R post knee jt in supine Standing pillow case slides with L LE , R LE wbing to engage stability R lat hip musculature.   08/16/23: Manual: side lying L for cross friction massage primarily R TFL musculature, using theragun Supine for manual stretching by therapist for R hamstrings, 3 bouts Supine for AP glides R tibia, with femur supported on bolster, with tibia adduction direction, 3 bouts, to assist with improving R knee flexion motion tolerance Supine for patellar mobs R  Provided with rubber insert  for L shoe as the cork is breaking down but working well.  Therapeutic exercise:  Bridging B, 15 x with sustained  abduction hips, with black t band around knees , cues to maintain tension for abduction Hooklying hip abd with black t band around knees 15x, needed cuing for placement Nustep  level 5 x 6 min to address  activity tolerance/ endurance  08/14/23 Therapuaetic activities Added blue t band around SI to replicate SI belt and support glut medius B, gait with the belt with some improvement in gait pattern , less sway R pelvis, but pt reported fatgue R hip abductors so removed Insert 1/8 L shoe for symmetrical iliac crest height B, gait with insert with definite improvement with R lateral pelvic shift in wbing.  Advised to trial for 24 to 48 hrs and self assess.  Manual: Side lying L for cross friction massage primarily R TFL musculature, using theragun Supine for patellar mobs R , also cross fricoitn massage med and lat knee jt lines , pt with some tenderness there  Therex:  Instructed in bridging B, 15 x with sustained  abduction hips, with black t band around knees  Hooklying hip abd with black t band around knees  15 x Provided with printed hand outs from [employer] to follow on these 2 ex.  Nustep Level 4  Ue's and LE's, x 3 min 30 sec at end of session for endurance.   08/09/23  Evaluation, instructed in seated  or long sitting R ankle pumps with black theraband resistance 15 reps, 1 x day, education throughout in evaluation findings and gait, improving gait efficiency with reducing postural. Pelvic sway   PATIENT EDUCATION:  Education details: added  Person educated: Patient Education method: Programmer, multimedia, Demonstration, Tactile cues, Verbal cues, and Handouts Education comprehension: verbalized understanding  HOME EXERCISE PROGRAM: TBD  ASSESSMENT:  CLINICAL IMPRESSION: Able to progress with strengthening as tolerated today. Pt still with TTP and tightness in R lat hip. Cues provided throughout session as needed to correct form and prevent compensation    Eval:Patient is a 73 y.o. female who was evaluated today by physical therapy  for recovery from a recent fall with IT band strain. Her pain has improved quite a bit, did find today weakness R plantarflexors  and R hip abductors, and some loss of extension Rom R knee as compared to her final PT visit at the end of 2024 as she was recovering from unilateral knee arthroplasty.  Her endurance is compromised, unable to complete 6 min walk test.  Also she has unsteady gait with marked postural sway, has additional history of falls prior to her R knee surgery.  Would benefit from physical therapy intervention to address her deficits and assist her with improving her balance and gait efficiency.    OBJECTIVE IMPAIRMENTS: decreased activity tolerance, decreased balance, decreased endurance, decreased knowledge of condition, difficulty walking, decreased ROM, decreased strength, and decreased safety awareness.   ACTIVITY LIMITATIONS: carrying, lifting, stairs, locomotion level, and caring for others  PARTICIPATION LIMITATIONS: cleaning, laundry, shopping, community activity, and yard work  PERSONAL FACTORS: Age, Behavior pattern, Fitness, Past/current experiences, Time since onset of injury/illness/exacerbation, and 1-2 comorbidities: recent partial R knee replacement are also affecting patient's functional outcome.   REHAB POTENTIAL: Good  CLINICAL DECISION MAKING: Evolving/moderate complexity  EVALUATION COMPLEXITY: Moderate   GOALS: Goals reviewed with patient? Yes  SHORT TERM GOALS: Target date: 2 weeks 08/23/23 I HEP Baseline: Goal status: MET- 08/30/23   LONG TERM GOALS: Target  date: 8 weeks 10/04/23  Strength R hip abd and plantarflexion improve to 5/5 from 4-/5 for increased stability with gait pattern Baseline:  Goal status: INITIAL  2.  Complete 6 min walk test, walking greater than 800'  Baseline: 4 min 24 sec and 600'  Goal status: INITIAL  3.  Improve R knee extension ROM to -5 or better from -11 for more efficient gait pattern Baseline:  Goal status: INITIAL  4.  30 sec sit to stand improve from 13 reps to 16 Baseline:  Goal status: 08/28/23; 14 reps improved       PLAN:  PT FREQUENCY: 2x/week  PT DURATION: 8 weeks  PLANNED INTERVENTIONS: 97110-Therapeutic exercises, 97530- Therapeutic activity, 97112- Neuromuscular re-education, 97535- Self Care, and [id number]- Manual therapy  PLAN FOR NEXT SESSION: strengthening for R LE plantarflexors and hip abductors, balance and pre gait, gait activities to focus on reducing R hip trendelenburg   Sol LITTIE Gaskins, PTA 08/30/2023, 11:53 AM

## 2023-09-04 ENCOUNTER — Other Ambulatory Visit: Payer: Self-pay

## 2023-09-04 ENCOUNTER — Ambulatory Visit

## 2023-09-04 DIAGNOSIS — M25661 Stiffness of right knee, not elsewhere classified: Secondary | ICD-10-CM | POA: Diagnosis not present

## 2023-09-04 DIAGNOSIS — R2689 Other abnormalities of gait and mobility: Secondary | ICD-10-CM

## 2023-09-04 DIAGNOSIS — M6281 Muscle weakness (generalized): Secondary | ICD-10-CM | POA: Diagnosis not present

## 2023-09-04 DIAGNOSIS — R262 Difficulty in walking, not elsewhere classified: Secondary | ICD-10-CM | POA: Diagnosis not present

## 2023-09-04 DIAGNOSIS — R600 Localized edema: Secondary | ICD-10-CM | POA: Diagnosis not present

## 2023-09-04 NOTE — Therapy (Unsigned)
 OUTPATIENT PHYSICAL THERAPY LOWER EXTREMITY TREATMENT   Patient Name: Megan Crane MRN: [id number] DOB:[birth year], 73 y.o., female Today's Date: 09/05/2023  END OF SESSION:  PT End of Session - 09/04/23 1020     Visit Number 6    Date for PT Re-Evaluation 10/04/23    Progress Note Due on Visit 10    PT Start Time 1015    PT Stop Time 1100    PT Time Calculation (min) 45 min    Activity Tolerance Patient tolerated treatment well    Behavior During Therapy WFL for tasks assessed/performed               Past Medical History:  Diagnosis Date   Anxiety disorder    hx vasovagal responses   Arthritis    Cancer (HCC)    Deep vein thrombosis (DVT) (HCC)    hx of in left left after covid in 2021   Headache(784.0)    History of kidney stones    Hydronephrosis, right    Hyperlipidemia    Hypertension    Nephrolithiasis    Pneumonia    Positive nasal culture for methicillin resistant Staphylococcus aureus    Right ureteral stone    Thrombophlebitis of leg, left, superficial 11/17/2020   Urinary incontinence    sees Dr. McDiarmid    Vasovagal syncope 08/02/2022   Past Surgical History:  Procedure Laterality Date   BLADDER REPAIR     COLONOSCOPY  04/26/2021   per Dr. Leigh, diffuse diverticulosis, rectosigmoid colitis with narrowed lumen, repeat in 5 yrs   CYSTOSCOPY  06/09/2011   Procedure: CYSTOSCOPY;  Surgeon: Glendia DELENA Elizabeth, MD;  Location: [facility];  Service: Urology;  Laterality: N/A;   CYSTOSCOPY W/ URETERAL STENT PLACEMENT  02/03/2014   Procedure: CYSTOSCOPY WITH  RIGHT RETROGRADE PYELOGRAM/ RIGHT URETERAL STENT PLACEMENT;  Surgeon: Norleen JINNY Seltzer, MD;  Location: [facility];  Service: Urology;;   CYSTOSCOPY WITH BIOPSY  02/03/2014   Procedure: CYSTOSCOPY WITH BIOPSY;  Surgeon: Norleen JINNY Seltzer, MD;  Location: [facility];  Service: Urology;;   PARTIAL KNEE ARTHROPLASTY Right 09/18/2022   Procedure: UNICOMPARTMENTAL KNEE;   Surgeon: Edna Toribio DELENA, MD;  Location: [facility];  Service: Orthopedics;  Laterality: Right;   PILONIDAL CYST EXCISION  1974   RECTOCELE REPAIR  06/09/2011   Procedure: POSTERIOR REPAIR (RECTOCELE);  Surgeon: Glendia DELENA Elizabeth, MD;  Location: [facility];  Service: Urology;  Laterality: N/A;  Xenform graft 6x10   RIGHT URETEROSCOPIC STONE EXTRACTION / STENT PLACEMENT  03/13/2000   URETEROSCOPY Right 02/03/2014   Procedure: URETEROSCOPY WITH MANAGEMENT OF URETERAL STRICTURE;  Surgeon: Norleen JINNY Seltzer, MD;  Location: [facility];  Service: Urology;  Laterality: Right;   VAGINAL HYSTERECTOMY  06/09/2011   Procedure: HYSTERECTOMY VAGINAL;  Surgeon: Charlie JONETTA Aho, MD;  Location: [facility];  Service: Gynecology;  Laterality: N/A;   VAGINAL PROLAPSE REPAIR  06/09/2011   Procedure: VAGINAL VAULT SUSPENSION;  Surgeon: Glendia DELENA Elizabeth, MD;  Location: [facility];  Service: Urology;  Laterality: N/A;   VEIN LIGATION AND STRIPPING     LEFT LEG   Patient Active Problem List   Diagnosis Date Noted   S/P right unicompartmental knee replacement 09/18/2022   IC (interstitial cystitis) 09/04/2022   Pelvic pressure in female 08/22/2022   Visit for pre-operative examination 08/07/2022   Vasovagal syncope 08/02/2022   Nasal dryness 06/30/2022   Precancerous skin lesion 06/30/2022   Bilateral carpal tunnel syndrome 03/02/2022  Swelling of both hands 03/02/2022   Atypical chest pain 03/02/2022   OAB (overactive bladder) 11/23/2021   Corn of foot 11/23/2021   Atrophic vaginitis 11/23/2021   COVID-19 virus infection 11/17/2020   Raynaud's phenomenon without gangrene 11/17/2020   Primary osteoarthritis involving multiple joints 11/24/2019   GERD (gastroesophageal reflux disease) 12/26/2016   Edema 05/29/2014   Nasal colonization with methicillin-resistant Staphylococcus aureus 01/22/2014   Varicose veins of bilateral lower extremities with other complications 12/11/2012   Allergic rhinitis  11/29/2009   MOTION SICKNESS 11/29/2009   Hyperlipidemia 08/31/2008   Anxiety state 08/31/2008   Primary hypertension 08/31/2008   PILONIDAL CYST 08/31/2008   HEADACHE 08/31/2008   URINARY INCONTINENCE 08/31/2008   NEPHROLITHIASIS, HX OF 08/31/2008   POSTNASAL DRIP SYNDROME 05/28/2006    PCP: Johnny Senior, MD   REFERRING PROVIDER: Edna Sieving, MD  REFERRING DIAG: R IT  band syndrome  THERAPY DIAG:  Muscle weakness (generalized)  Difficulty in walking, not elsewhere classified  Stiffness of right knee, not elsewhere classified  Other abnormalities of gait and mobility  Rationale for Evaluation and Treatment: Rehabilitation  ONSET DATE: 06/29/23  SUBJECTIVE:   SUBJECTIVE STATEMENT: 09/04/23:  I feel like I'm better, I really like the lift in my shoe, I think my endurance and balance are better  Eval:Fell in hallway, dog had accident , L foot slipped out, landed on R lat knee with knee bent.  Saw orthopedist who did the partial knee replacement last year and they referred me here.  Then I had diverticulitis, then bronchitis, so I've been really weak and my immunity/ stamina has been depleted.  Noticing R ant knee and lower leg pulls, aches when I bend it a lot.  More prevalent problem currently is my stamina  PERTINENT HISTORY: Referred by orthopedist PAIN:  Are you having pain? Reports mild pain R post knee and ant lower leg, knee with specific positions, no interference with sleep or most functional activities  PRECAUTIONS: None  RED FLAGS: None   WEIGHT BEARING RESTRICTIONS: No  FALLS:  Has patient fallen in last 6 months? Yes. Number of falls 1  LIVING ENVIRONMENT: Lives with: lives with their family and lives with their spouse Lives in: House/apartment Stairs: No Has following equipment at home: Single point cane  OCCUPATION: retired  PLOF: Independent  PATIENT GOALS: return to normal with endurance, activity tolerance  NEXT MD VISIT:  unclear  OBJECTIVE:  Note: Objective measures were completed at Evaluation unless otherwise noted.  DIAGNOSTIC FINDINGS: orthopedist radiology results wnl  PATIENT SURVEYS:  PSFS: THE PATIENT SPECIFIC FUNCTIONAL SCALE  Place score of 0-10 (0 = unable to perform activity and 10 = able to perform activity at the same level as before injury or problem)  Activity Date: 08/09/23    Getting out of car and standing up 5    2.  Walking through grocery store 5    3.  Prolonged sitting  8    4.      Total Score 18      Total Score = Sum of activity scores/number of activities  Minimally Detectable Change: 3 points (for single activity); 2 points (for average score)  [facility] (nd). The Patient Specific Functional Scale . Retrieved from [email or web address]   COGNITION: Overall cognitive status: Within functional limits for tasks assessed     SENSATION: WFL  EDEMA:  Mild visible distal R thigh  MUSCLE LENGTH: Hamstrings: Right wnl deg; Left wnl deg Debby test: Right nt deg;  Left nt deg  POSTURE: anterior pelvic tilt and R knee flexion 10 degrees in standing  PALPATION: Mild tenderness over lateral trochanter R hip and R IT band  LOWER EXTREMITY ROM:  AA ROM Right eval Left eval R  09/04/23  Hip flexion     Hip extension     Hip abduction     Hip adduction     Hip internal rotation     Hip external rotation     Knee flexion 116  130  Knee extension -8    Ankle dorsiflexion     Ankle plantarflexion     Ankle inversion     Ankle eversion      (Blank rows = not tested)  LOWER EXTREMITY MMT:  MMT Right eval Left eval  Hip flexion    Hip extension    Hip abduction 4-   Hip adduction    Hip internal rotation    Hip external rotation 5   Knee flexion    Knee extension    Ankle dorsiflexion    Ankle plantarflexion 4-   Ankle inversion    Ankle eversion     (Blank rows = not tested)  LOWER  EXTREMITY SPECIAL TESTS:  Hip special tests: Belvie (FABER) test: negative  FUNCTIONAL TESTS:  6 minute walk test: 4 min 24 sec 30 sec sit to stand 13 reps GAIT: Distance walked: 600' Assistive device utilized: None Level of assistance: Modified independence Comments: gait pattern with + trendelenburg R, increased hip sway B, tends to scissor at time with gait.                                                                                                                                TREATMENT DATE: 09/04/23:  Nustep level 5 x 7 min Gait in hallway, 300'  pt with insert L shoe, walked well, no trendelenberg noted, no scissoring R hamstring stretch with therapist providing overpressure R post knee jt in supine R ankle distraction with pt performing quad sets for post capsule stretch B knee ext 15lb x 2 sets 15 BLE B knee flexion 2 x 15 25#  08/30/23: Nustep level 5 x  6 min UE's and LE's Side lying L for theragun massage R hip abductors, ITB Seated R HS stretch RLE x 30 B knee ext 15lb x 15 BLE; RLE 5lb x 15 Sidelying R hip abduction x 10  Standing hip abduction 3lb 2x10 BLE Standing HS curls 3lb 2x10 BLE Standing hip ext 3lb 2x10 BLE  08/28/23: Nustep level 5 x  6 min UE's and LE's Side lying L for theragun massage R hip abductors Supine for distraction  R ankle with quad sets, 5 sec holds, x 10 reps  B knee ext 10#, con lift, eccentric lowering with R, x 10 reps x 3 B knee flex 25# CON lowering and eccentric raising with R LE Side lying for R hip clam shell with 3#  cuff wt over R knee jt R hamstring stretch with therapist providing overpressure R post knee jt in supine Standing pillow case slides with L LE , R LE wbing to engage stability R lat hip musculature.   08/16/23: Manual: side lying L for cross friction massage primarily R TFL musculature, using theragun Supine for manual stretching by therapist for R hamstrings, 3 bouts Supine for AP glides R tibia, with  femur supported on bolster, with tibia adduction direction, 3 bouts, to assist with improving R knee flexion motion tolerance Supine for patellar mobs R  Provided with rubber insert for L shoe as the cork is breaking down but working well.  Therapeutic exercise:  Bridging B, 15 x with sustained  abduction hips, with black t band around knees , cues to maintain tension for abduction Hooklying hip abd with black t band around knees 15x, needed cuing for placement Nustep level 5 x 6 min to address  activity tolerance/ endurance  08/14/23 Therapuaetic activities Added blue t band around SI to replicate SI belt and support glut medius B, gait with the belt with some improvement in gait pattern , less sway R pelvis, but pt reported fatgue R hip abductors so removed Insert 1/8 L shoe for symmetrical iliac crest height B, gait with insert with definite improvement with R lateral pelvic shift in wbing.  Advised to trial for 24 to 48 hrs and self assess.  Manual: Side lying L for cross friction massage primarily R TFL musculature, using theragun Supine for patellar mobs R , also cross fricoitn massage med and lat knee jt lines , pt with some tenderness there  Therex:  Instructed in bridging B, 15 x with sustained  abduction hips, with black t band around knees  Hooklying hip abd with black t band around knees  15 x Provided with printed hand outs from [employer] to follow on these 2 ex.  Nustep Level 4  Ue's and LE's, x 3 min 30 sec at end of session for endurance.   08/09/23  Evaluation, instructed in seated  or long sitting R ankle pumps with black theraband resistance 15 reps, 1 x day, education throughout in evaluation findings and gait, improving gait efficiency with reducing postural. Pelvic sway   PATIENT EDUCATION:  Education details: added  Person educated: Patient Education method: Programmer, multimedia, Demonstration, Tactile cues, Verbal cues, and Handouts Education comprehension: verbalized  understanding  HOME EXERCISE PROGRAM: TBD  ASSESSMENT:  CLINICAL IMPRESSION: 09/04/23: Able to progress with strengthening as tolerated today. Pt still with TTP and tightness in R lat hip. R knee flexion improved to 130 compared to 114 at initial session, less sway noted with gait, less lurch B hips.  Has improved with 5 x sit to stand score as well.Cues provided throughout session as needed to correct form and prevent compensation    Eval:Patient is a 73 y.o. female who was evaluated today by physical therapy  for recovery from a recent fall with IT band strain. Her pain has improved quite a bit, did find today weakness R plantarflexors and R hip abductors, and some loss of extension Rom R knee as compared to her final PT visit at the end of 2024 as she was recovering from unilateral knee arthroplasty.  Her endurance is compromised, unable to complete 6 min walk test.  Also she has unsteady gait with marked postural sway, has additional history of falls prior to her R knee surgery.  Would benefit from physical therapy intervention to address her  deficits and assist her with improving her balance and gait efficiency.    OBJECTIVE IMPAIRMENTS: decreased activity tolerance, decreased balance, decreased endurance, decreased knowledge of condition, difficulty walking, decreased ROM, decreased strength, and decreased safety awareness.   ACTIVITY LIMITATIONS: carrying, lifting, stairs, locomotion level, and caring for others  PARTICIPATION LIMITATIONS: cleaning, laundry, shopping, community activity, and yard work  PERSONAL FACTORS: Age, Behavior pattern, Fitness, Past/current experiences, Time since onset of injury/illness/exacerbation, and 1-2 comorbidities: recent partial R knee replacement are also affecting patient's functional outcome.   REHAB POTENTIAL: Good  CLINICAL DECISION MAKING: Evolving/moderate complexity  EVALUATION COMPLEXITY: Moderate   GOALS: Goals reviewed with patient?  Yes  SHORT TERM GOALS: Target date: 2 weeks 08/23/23 I HEP Baseline: Goal status: MET- 08/30/23   LONG TERM GOALS: Target date: 8 weeks 10/04/23  Strength R hip abd and plantarflexion improve to 5/5 from 4-/5 for increased stability with gait pattern Baseline:  Goal status:09/04/23: R hip abd improved today to 4+/5 with MMT, advanced resistance training  2.  Complete 6 min walk test, walking greater than 800'  Baseline: 4 min 24 sec and 600'  Goal status: INITIAL  3.  Improve R knee extension ROM to -5 or better from -11 for more efficient gait pattern Baseline:  Goal status: in progress 09/04/23: improved today to -8  4.  30 sec sit to stand improve from 13 reps to 16 Baseline:  Goal status:progressing 08/28/23; 14 reps improved       PLAN:  PT FREQUENCY: 2x/week  PT DURATION: 8 weeks  PLANNED INTERVENTIONS: 97110-Therapeutic exercises, 97530- Therapeutic activity, 97112- Neuromuscular re-education, 97535- Self Care, and [id number]- Manual therapy  PLAN FOR NEXT SESSION: strengthening for R LE plantarflexors and hip abductors, balance and pre gait, gait activities to focus on reducing R hip trendelenburg   Landry Lookingbill L Sandy Blouch, PT, DPT, OCS 09/05/2023, 5:46 PM

## 2023-09-07 ENCOUNTER — Ambulatory Visit

## 2023-09-07 DIAGNOSIS — R600 Localized edema: Secondary | ICD-10-CM | POA: Diagnosis not present

## 2023-09-07 DIAGNOSIS — R2689 Other abnormalities of gait and mobility: Secondary | ICD-10-CM

## 2023-09-07 DIAGNOSIS — R262 Difficulty in walking, not elsewhere classified: Secondary | ICD-10-CM

## 2023-09-07 DIAGNOSIS — M25661 Stiffness of right knee, not elsewhere classified: Secondary | ICD-10-CM

## 2023-09-07 DIAGNOSIS — M6281 Muscle weakness (generalized): Secondary | ICD-10-CM

## 2023-09-07 NOTE — Therapy (Signed)
 OUTPATIENT PHYSICAL THERAPY LOWER EXTREMITY TREATMENT   Patient Name: Megan Crane MRN: [id number] DOB:[birth year], 73 y.o., female Today's Date: 09/07/2023  END OF SESSION:  PT End of Session - 09/07/23 1110     Visit Number 7    Date for PT Re-Evaluation 10/04/23    Progress Note Due on Visit 10    PT Start Time 1019    PT Stop Time 1102    PT Time Calculation (min) 43 min    Activity Tolerance Patient tolerated treatment well    Behavior During Therapy WFL for tasks assessed/performed                Past Medical History:  Diagnosis Date   Anxiety disorder    hx vasovagal responses   Arthritis    Cancer (HCC)    Deep vein thrombosis (DVT) (HCC)    hx of in left left after covid in 2021   Headache(784.0)    History of kidney stones    Hydronephrosis, right    Hyperlipidemia    Hypertension    Nephrolithiasis    Pneumonia    Positive nasal culture for methicillin resistant Staphylococcus aureus    Right ureteral stone    Thrombophlebitis of leg, left, superficial 11/17/2020   Urinary incontinence    sees Dr. McDiarmid    Vasovagal syncope 08/02/2022   Past Surgical History:  Procedure Laterality Date   BLADDER REPAIR     COLONOSCOPY  04/26/2021   per Dr. Leigh, diffuse diverticulosis, rectosigmoid colitis with narrowed lumen, repeat in 5 yrs   CYSTOSCOPY  06/09/2011   Procedure: CYSTOSCOPY;  Surgeon: Glendia DELENA Elizabeth, MD;  Location: [facility];  Service: Urology;  Laterality: N/A;   CYSTOSCOPY W/ URETERAL STENT PLACEMENT  02/03/2014   Procedure: CYSTOSCOPY WITH  RIGHT RETROGRADE PYELOGRAM/ RIGHT URETERAL STENT PLACEMENT;  Surgeon: Norleen JINNY Seltzer, MD;  Location: [facility];  Service: Urology;;   CYSTOSCOPY WITH BIOPSY  02/03/2014   Procedure: CYSTOSCOPY WITH BIOPSY;  Surgeon: Norleen JINNY Seltzer, MD;  Location: [facility];  Service: Urology;;   PARTIAL KNEE ARTHROPLASTY Right 09/18/2022   Procedure: UNICOMPARTMENTAL KNEE;   Surgeon: Edna Toribio DELENA, MD;  Location: [facility];  Service: Orthopedics;  Laterality: Right;   PILONIDAL CYST EXCISION  1974   RECTOCELE REPAIR  06/09/2011   Procedure: POSTERIOR REPAIR (RECTOCELE);  Surgeon: Glendia DELENA Elizabeth, MD;  Location: [facility];  Service: Urology;  Laterality: N/A;  Xenform graft 6x10   RIGHT URETEROSCOPIC STONE EXTRACTION / STENT PLACEMENT  03/13/2000   URETEROSCOPY Right 02/03/2014   Procedure: URETEROSCOPY WITH MANAGEMENT OF URETERAL STRICTURE;  Surgeon: Norleen JINNY Seltzer, MD;  Location: [facility];  Service: Urology;  Laterality: Right;   VAGINAL HYSTERECTOMY  06/09/2011   Procedure: HYSTERECTOMY VAGINAL;  Surgeon: Charlie JONETTA Aho, MD;  Location: [facility];  Service: Gynecology;  Laterality: N/A;   VAGINAL PROLAPSE REPAIR  06/09/2011   Procedure: VAGINAL VAULT SUSPENSION;  Surgeon: Glendia DELENA Elizabeth, MD;  Location: [facility];  Service: Urology;  Laterality: N/A;   VEIN LIGATION AND STRIPPING     LEFT LEG   Patient Active Problem List   Diagnosis Date Noted   S/P right unicompartmental knee replacement 09/18/2022   IC (interstitial cystitis) 09/04/2022   Pelvic pressure in female 08/22/2022   Visit for pre-operative examination 08/07/2022   Vasovagal syncope 08/02/2022   Nasal dryness 06/30/2022   Precancerous skin lesion 06/30/2022   Bilateral carpal tunnel syndrome 03/02/2022  Swelling of both hands 03/02/2022   Atypical chest pain 03/02/2022   OAB (overactive bladder) 11/23/2021   Corn of foot 11/23/2021   Atrophic vaginitis 11/23/2021   COVID-19 virus infection 11/17/2020   Raynaud's phenomenon without gangrene 11/17/2020   Primary osteoarthritis involving multiple joints 11/24/2019   GERD (gastroesophageal reflux disease) 12/26/2016   Edema 05/29/2014   Nasal colonization with methicillin-resistant Staphylococcus aureus 01/22/2014   Varicose veins of bilateral lower extremities with other complications 12/11/2012   Allergic rhinitis  11/29/2009   MOTION SICKNESS 11/29/2009   Hyperlipidemia 08/31/2008   Anxiety state 08/31/2008   Primary hypertension 08/31/2008   PILONIDAL CYST 08/31/2008   HEADACHE 08/31/2008   URINARY INCONTINENCE 08/31/2008   NEPHROLITHIASIS, HX OF 08/31/2008   POSTNASAL DRIP SYNDROME 05/28/2006    PCP: Johnny Senior, MD   REFERRING PROVIDER: Edna Sieving, MD  REFERRING DIAG: R IT  band syndrome  THERAPY DIAG:  Muscle weakness (generalized)  Difficulty in walking, not elsewhere classified  Stiffness of right knee, not elsewhere classified  Other abnormalities of gait and mobility  Localized edema  Rationale for Evaluation and Treatment: Rehabilitation  ONSET DATE: 06/29/23  SUBJECTIVE:   SUBJECTIVE STATEMENT: Cleaned the whole pantry last night, a little sore in medial knee  Eval:Fell in hallway, dog had accident , L foot slipped out, landed on R lat knee with knee bent.  Saw orthopedist who did the partial knee replacement last year and they referred me here.  Then I had diverticulitis, then bronchitis, so I've been really weak and my immunity/ stamina has been depleted.  Noticing R ant knee and lower leg pulls, aches when I bend it a lot.  More prevalent problem currently is my stamina  PERTINENT HISTORY: Referred by orthopedist PAIN:  Are you having pain? Reports mild pain R post knee and ant lower leg, knee with specific positions, no interference with sleep or most functional activities  PRECAUTIONS: None  RED FLAGS: None   WEIGHT BEARING RESTRICTIONS: No  FALLS:  Has patient fallen in last 6 months? Yes. Number of falls 1  LIVING ENVIRONMENT: Lives with: lives with their family and lives with their spouse Lives in: House/apartment Stairs: No Has following equipment at home: Single point cane  OCCUPATION: retired  PLOF: Independent  PATIENT GOALS: return to normal with endurance, activity tolerance  NEXT MD VISIT: unclear  OBJECTIVE:  Note:  Objective measures were completed at Evaluation unless otherwise noted.  DIAGNOSTIC FINDINGS: orthopedist radiology results wnl  PATIENT SURVEYS:  PSFS: THE PATIENT SPECIFIC FUNCTIONAL SCALE  Place score of 0-10 (0 = unable to perform activity and 10 = able to perform activity at the same level as before injury or problem)  Activity Date: 08/09/23    Getting out of car and standing up 5    2.  Walking through grocery store 5    3.  Prolonged sitting  8    4.      Total Score 18      Total Score = Sum of activity scores/number of activities  Minimally Detectable Change: 3 points (for single activity); 2 points (for average score)  [facility] (nd). The Patient Specific Functional Scale . Retrieved from [email or web address]   COGNITION: Overall cognitive status: Within functional limits for tasks assessed     SENSATION: WFL  EDEMA:  Mild visible distal R thigh  MUSCLE LENGTH: Hamstrings: Right wnl deg; Left wnl deg Debby test: Right nt deg; Left nt deg  POSTURE: anterior pelvic tilt  and R knee flexion 10 degrees in standing  PALPATION: Mild tenderness over lateral trochanter R hip and R IT band  LOWER EXTREMITY ROM:  AA ROM Right eval Left eval R  09/04/23  Hip flexion     Hip extension     Hip abduction     Hip adduction     Hip internal rotation     Hip external rotation     Knee flexion 116  130  Knee extension -8    Ankle dorsiflexion     Ankle plantarflexion     Ankle inversion     Ankle eversion      (Blank rows = not tested)  LOWER EXTREMITY MMT:  MMT Right eval Left eval  Hip flexion    Hip extension    Hip abduction 4-   Hip adduction    Hip internal rotation    Hip external rotation 5   Knee flexion    Knee extension    Ankle dorsiflexion    Ankle plantarflexion 4-   Ankle inversion    Ankle eversion     (Blank rows = not tested)  LOWER EXTREMITY SPECIAL TESTS:  Hip  special tests: Belvie (FABER) test: negative  FUNCTIONAL TESTS:  6 minute walk test: 4 min 24 sec 30 sec sit to stand 13 reps GAIT: Distance walked: 600' Assistive device utilized: None Level of assistance: Modified independence Comments: gait pattern with + trendelenburg R, increased hip sway B, tends to scissor at time with gait.                                                                                                                                TREATMENT DATE: 09/07/23 walk test- went whole time 1200 ft no break, some labored Sit to stands x 10 Wall squats 2x10 mini squats B knee ext 10lb 3x10 BLE B knee flexion 3x10 25# BLE Heel drop heel raise 2x10 B Lateral step downs 2' 2x10 BLE  09/04/23:  Nustep level 5 x 7 min Gait in hallway, 300'  pt with insert L shoe, walked well, no trendelenberg noted, no scissoring R hamstring stretch with therapist providing overpressure R post knee jt in supine R ankle distraction with pt performing quad sets for post capsule stretch B knee ext 15lb x 2 sets 15 BLE B knee flexion 2 x 15 25#  08/30/23: Nustep level 5 x  6 min UE's and LE's Side lying L for theragun massage R hip abductors, ITB Seated R HS stretch RLE x 30 B knee ext 15lb x 15 BLE; RLE 5lb x 15 Sidelying R hip abduction x 10  Standing hip abduction 3lb 2x10 BLE Standing HS curls 3lb 2x10 BLE Standing hip ext 3lb 2x10 BLE  08/28/23: Nustep level 5 x  6 min UE's and LE's Side lying L for theragun massage R hip abductors Supine for distraction  R ankle with quad sets, 5 sec holds,  x 10 reps  B knee ext 10#, con lift, eccentric lowering with R, x 10 reps x 3 B knee flex 25# CON lowering and eccentric raising with R LE Side lying for R hip clam shell with 3# cuff wt over R knee jt R hamstring stretch with therapist providing overpressure R post knee jt in supine Standing pillow case slides with L LE , R LE wbing to engage stability R lat hip musculature.    08/16/23: Manual: side lying L for cross friction massage primarily R TFL musculature, using theragun Supine for manual stretching by therapist for R hamstrings, 3 bouts Supine for AP glides R tibia, with femur supported on bolster, with tibia adduction direction, 3 bouts, to assist with improving R knee flexion motion tolerance Supine for patellar mobs R  Provided with rubber insert for L shoe as the cork is breaking down but working well.  Therapeutic exercise:  Bridging B, 15 x with sustained  abduction hips, with black t band around knees , cues to maintain tension for abduction Hooklying hip abd with black t band around knees 15x, needed cuing for placement Nustep level 5 x 6 min to address  activity tolerance/ endurance  08/14/23 Therapuaetic activities Added blue t band around SI to replicate SI belt and support glut medius B, gait with the belt with some improvement in gait pattern , less sway R pelvis, but pt reported fatgue R hip abductors so removed Insert 1/8 L shoe for symmetrical iliac crest height B, gait with insert with definite improvement with R lateral pelvic shift in wbing.  Advised to trial for 24 to 48 hrs and self assess.  Manual: Side lying L for cross friction massage primarily R TFL musculature, using theragun Supine for patellar mobs R , also cross fricoitn massage med and lat knee jt lines , pt with some tenderness there  Therex:  Instructed in bridging B, 15 x with sustained  abduction hips, with black t band around knees  Hooklying hip abd with black t band around knees  15 x Provided with printed hand outs from [employer] to follow on these 2 ex.  Nustep Level 4  Ue's and LE's, x 3 min 30 sec at end of session for endurance.   08/09/23  Evaluation, instructed in seated  or long sitting R ankle pumps with black theraband resistance 15 reps, 1 x day, education throughout in evaluation findings and gait, improving gait efficiency with reducing postural. Pelvic  sway   PATIENT EDUCATION:  Education details: added new HEP Person educated: Patient Education method: Explanation, Demonstration, Tactile cues, Verbal cues, and Handouts Education comprehension: verbalized understanding  HOME EXERCISE PROGRAM: Access Code: [email or web address] URL: [email or web address] Date: 09/07/2023 Prepared by: Shanai Lartigue  Exercises - Wall Quarter Squat  - 1 x daily - 7 x weekly - 3 sets - 10 reps - Standing Bilateral Heel Raise on Step  - 1 x daily - 7 x weekly - 3 sets - 10 reps  ASSESSMENT:  CLINICAL IMPRESSION: Pt continues to respond well to progressed strengthening. Notes improvement in pain and sx. Better able to complete 6 min walk test today w/o rest, although noted some labored breathing. Introduced more CKC knee and hip strengthening today. Needed to reduce weight on knee ext machine today. Instruction given throughout session for proper performance of exercises.  Eval:Patient is a 73 y.o. female who was evaluated today by physical therapy  for recovery from a recent fall with IT band strain. Her pain has  improved quite a bit, did find today weakness R plantarflexors and R hip abductors, and some loss of extension Rom R knee as compared to her final PT visit at the end of 2024 as she was recovering from unilateral knee arthroplasty.  Her endurance is compromised, unable to complete 6 min walk test.  Also she has unsteady gait with marked postural sway, has additional history of falls prior to her R knee surgery.  Would benefit from physical therapy intervention to address her deficits and assist her with improving her balance and gait efficiency.    OBJECTIVE IMPAIRMENTS: decreased activity tolerance, decreased balance, decreased endurance, decreased knowledge of condition, difficulty walking, decreased ROM, decreased strength, and decreased safety awareness.   ACTIVITY LIMITATIONS: carrying, lifting, stairs, locomotion level, and caring for  others  PARTICIPATION LIMITATIONS: cleaning, laundry, shopping, community activity, and yard work  PERSONAL FACTORS: Age, Behavior pattern, Fitness, Past/current experiences, Time since onset of injury/illness/exacerbation, and 1-2 comorbidities: recent partial R knee replacement are also affecting patient's functional outcome.   REHAB POTENTIAL: Good  CLINICAL DECISION MAKING: Evolving/moderate complexity  EVALUATION COMPLEXITY: Moderate   GOALS: Goals reviewed with patient? Yes  SHORT TERM GOALS: Target date: 2 weeks 08/23/23 I HEP Baseline: Goal status: MET- 08/30/23   LONG TERM GOALS: Target date: 8 weeks 10/04/23  Strength R hip abd and plantarflexion improve to 5/5 from 4-/5 for increased stability with gait pattern Baseline:  Goal status:09/04/23: R hip abd improved today to 4+/5 with MMT, advanced resistance training  2.  Complete 6 min walk test, walking greater than 800'  Baseline: 4 min 24 sec and 600'  Goal status: INITIAL  3.  Improve R knee extension ROM to -5 or better from -11 for more efficient gait pattern Baseline:  Goal status: in progress 09/04/23: improved today to -8  4.  30 sec sit to stand improve from 13 reps to 16 Baseline:  Goal status:progressing 08/28/23; 14 reps improved       PLAN:  PT FREQUENCY: 2x/week  PT DURATION: 8 weeks  PLANNED INTERVENTIONS: 97110-Therapeutic exercises, 97530- Therapeutic activity, 97112- Neuromuscular re-education, 97535- Self Care, and [id number]- Manual therapy  PLAN FOR NEXT SESSION: strengthening for R LE plantarflexors and hip abductors, balance and pre gait, gait activities to focus on reducing R hip trendelenburg   Sol LITTIE Gaskins, PTA 09/07/2023, 12:07 PM

## 2023-09-11 ENCOUNTER — Ambulatory Visit: Attending: Orthopedic Surgery

## 2023-09-11 ENCOUNTER — Other Ambulatory Visit: Payer: Self-pay

## 2023-09-11 DIAGNOSIS — M6281 Muscle weakness (generalized): Secondary | ICD-10-CM | POA: Diagnosis not present

## 2023-09-11 DIAGNOSIS — R2689 Other abnormalities of gait and mobility: Secondary | ICD-10-CM | POA: Diagnosis not present

## 2023-09-11 DIAGNOSIS — R600 Localized edema: Secondary | ICD-10-CM | POA: Diagnosis not present

## 2023-09-11 DIAGNOSIS — R262 Difficulty in walking, not elsewhere classified: Secondary | ICD-10-CM | POA: Diagnosis not present

## 2023-09-11 DIAGNOSIS — M25561 Pain in right knee: Secondary | ICD-10-CM | POA: Diagnosis not present

## 2023-09-11 DIAGNOSIS — M25661 Stiffness of right knee, not elsewhere classified: Secondary | ICD-10-CM | POA: Diagnosis not present

## 2023-09-11 NOTE — Therapy (Signed)
OUTPATIENT PHYSICAL THERAPY LOWER EXTREMITY TREATMENT   Patient Name: LESLYE Crane MRN: [id number] DOB:[birth year], 73 y.o., female Today's Date: 09/11/2023  END OF SESSION:  PT End of Session - 09/11/23 1307     Visit Number 8    Date for PT Re-Evaluation 10/04/23    Progress Note Due on Visit 10    PT Start Time 1015    PT Stop Time 1100    PT Time Calculation (min) 45 min    Activity Tolerance Patient tolerated treatment well    Behavior During Therapy [facility] for tasks assessed/performed                 Past Medical History:  Diagnosis Date   Anxiety disorder    hx vasovagal responses   Arthritis    Cancer (HCC)    Deep vein thrombosis (DVT) (HCC)    hx of in left left after covid in 2021   Headache(784.0)    History of kidney stones    Hydronephrosis, right    Hyperlipidemia    Hypertension    Nephrolithiasis    Pneumonia    Positive nasal culture for methicillin resistant Staphylococcus aureus    Right ureteral stone    Thrombophlebitis of leg, left, superficial 11/17/2020   Urinary incontinence    sees Dr. McDiarmid    Vasovagal syncope 08/02/2022   Past Surgical History:  Procedure Laterality Date   BLADDER REPAIR     COLONOSCOPY  04/26/2021   per Dr. Leigh, diffuse diverticulosis, rectosigmoid colitis with narrowed lumen, repeat in 5 yrs   CYSTOSCOPY  06/09/2011   Procedure: CYSTOSCOPY;  Surgeon: Megan Crane Elizabeth, MD;  Location: [facility];  Service: Urology;  Laterality: N/A;   CYSTOSCOPY W/ URETERAL STENT PLACEMENT  02/03/2014   Procedure: CYSTOSCOPY WITH  RIGHT RETROGRADE PYELOGRAM/ RIGHT URETERAL STENT PLACEMENT;  Surgeon: Megan JINNY Seltzer, MD;  Location: [facility];  Service: Urology;;   CYSTOSCOPY WITH BIOPSY  02/03/2014   Procedure: CYSTOSCOPY WITH BIOPSY;  Surgeon: Megan JINNY Seltzer, MD;  Location[facility];  Service: Urology;;   PARTIAL KNEE ARTHROPLASTY Right 09/18/2022   Procedure: UNICOMPARTMENTAL KNEE;   Surgeon: Megan Toribio DELENA, MD;  Location: [facility];  Service: Orthopedics;  Laterality: Right;   PILONIDAL CYST EXCISION  1974   RECTOCELE REPAIR  06/09/2011   Procedure: POSTERIOR REPAIR (RECTOCELE);  Surgeon: Megan Crane Elizabeth, MD;  Location: [facility];  Service: Urology;  Laterality: N/A;  Xenform graft 6x10   RIGHT URETEROSCOPIC STONE EXTRACTION / STENT PLACEMENT  03/13/2000   URETEROSCOPY Right 02/03/2014   Procedure: URETEROSCOPY WITH MANAGEMENT OF URETERAL STRICTURE;  Surgeon: Megan JINNY Seltzer, MD;  Location: [facility];  Service: Urology;  Laterality: Right;   VAGINAL HYSTERECTOMY  06/09/2011   Procedure: HYSTERECTOMY VAGINAL;  Surgeon: Megan JONETTA Aho, MD;  Location: [facility];  Service: Gynecology;  Laterality: N/A;   VAGINAL PROLAPSE REPAIR  06/09/2011   Procedure: VAGINAL VAULT SUSPENSION;  Surgeon: Megan Crane Elizabeth, MD;  Location: [facility];  Service: Urology;  Laterality: N/A;   VEIN LIGATION AND STRIPPING     LEFT LEG   Patient Active Problem List   Diagnosis Date Noted   S/P right unicompartmental knee replacement 09/18/2022   IC (interstitial cystitis) 09/04/2022   Pelvic pressure in female 08/22/2022   Visit for pre-operative examination 08/07/2022   Vasovagal syncope 08/02/2022   Nasal dryness 06/30/2022   Precancerous skin lesion 06/30/2022   Bilateral carpal tunnel syndrome 03/02/2022  Swelling of both hands 03/02/2022   Atypical chest pain 03/02/2022   OAB (overactive bladder) 11/23/2021   Corn of foot 11/23/2021   Atrophic vaginitis 11/23/2021   COVID-19 virus infection 11/17/2020   Raynaud's phenomenon without gangrene 11/17/2020   Primary osteoarthritis involving multiple joints 11/24/2019   GERD (gastroesophageal reflux disease) 12/26/2016   Edema 05/29/2014   Nasal colonization with methicillin-resistant Staphylococcus aureus 01/22/2014   Varicose veins of bilateral lower extremities with other complications 12/11/2012   Allergic rhinitis  11/29/2009   MOTION SICKNESS 11/29/2009   Hyperlipidemia 08/31/2008   Anxiety state 08/31/2008   Primary hypertension 08/31/2008   PILONIDAL CYST 08/31/2008   HEADACHE 08/31/2008   URINARY INCONTINENCE 08/31/2008   NEPHROLITHIASIS, HX OF 08/31/2008   POSTNASAL DRIP SYNDROME 05/28/2006    PCP: Johnny Senior, MD   REFERRING PROVIDER: Edna Sieving, MD  REFERRING DIAG: R IT  band syndrome  THERAPY DIAG:  Muscle weakness (generalized)  Difficulty in walking, not elsewhere classified  Stiffness of right knee, not elsewhere classified  Other abnormalities of gait and mobility  Rationale for Evaluation and Treatment: Rehabilitation  ONSET DATE: 06/29/23  SUBJECTIVE:   SUBJECTIVE STATEMENT: Better, walked all over [place] with one of my friends this Saturday and did ok, not as fatigued as usual. Some pain R ant knee at times.  Notes walks better on gravel vs hard concrete surfaces  Eval:Fell in hallway, dog had accident , L foot slipped out, landed on R lat knee with knee bent.  Saw orthopedist who did the partial knee replacement last year and they referred me here.  Then I had diverticulitis, then bronchitis, so I've been really weak and my immunity/ stamina has been depleted.  Noticing R ant knee and lower leg pulls, aches when I bend it a lot.  More prevalent problem currently is my stamina  PERTINENT HISTORY: Referred by orthopedist PAIN:  Are you having pain? Reports mild pain R post knee and ant lower leg, knee with specific positions, no interference with sleep or most functional activities  PRECAUTIONS: None  RED FLAGS: None   WEIGHT BEARING RESTRICTIONS: No  FALLS:  Has patient fallen in last 6 months? Yes. Number of falls 1  LIVING ENVIRONMENT: Lives with: lives with their family and lives with their spouse Lives in: House/apartment Stairs: No Has following equipment at home: Single point cane  OCCUPATION: retired  PLOF: Independent  PATIENT  GOALS: return to normal with endurance, activity tolerance  NEXT MD VISIT: unclear  OBJECTIVE:  Note: Objective measures were completed at Evaluation unless otherwise noted.  DIAGNOSTIC FINDINGS: orthopedist radiology results wnl  PATIENT SURVEYS:  PSFS: THE PATIENT SPECIFIC FUNCTIONAL SCALE  Place score of 0-10 (0 = unable to perform activity and 10 = able to perform activity at the same level as before injury or problem)  Activity Date: 08/09/23    Getting out of car and standing up 5    2.  Walking through grocery store 5    3.  Prolonged sitting  8    4.      Total Score 18      Total Score = Sum of activity scores/number of activities  Minimally Detectable Change: 3 points (for single activity); 2 points (for average score)  [facility] (nd). The Patient Specific Functional Scale . Retrieved from [email or web address]   COGNITION: Overall cognitive status: Within functional limits for tasks assessed     SENSATION: WFL  EDEMA:  Mild visible distal R thigh  MUSCLE LENGTH: Hamstrings: Right wnl deg; Left wnl deg Debby test: Right nt deg; Left nt deg  POSTURE: anterior pelvic tilt and R knee flexion 10 degrees in standing  PALPATION: Mild tenderness over lateral trochanter R hip and R IT band  LOWER EXTREMITY ROM:  AA ROM Right eval Left eval R  09/04/23  Hip flexion     Hip extension     Hip abduction     Hip adduction     Hip internal rotation     Hip external rotation     Knee flexion 116  130  Knee extension -8    Ankle dorsiflexion     Ankle plantarflexion     Ankle inversion     Ankle eversion      (Blank rows = not tested)  LOWER EXTREMITY MMT:  MMT Right eval Left eval  Hip flexion    Hip extension    Hip abduction 4-   Hip adduction    Hip internal rotation    Hip external rotation 5   Knee flexion    Knee extension    Ankle dorsiflexion    Ankle plantarflexion 4-    Ankle inversion    Ankle eversion     (Blank rows = not tested)  LOWER EXTREMITY SPECIAL TESTS:  Hip special tests: Belvie (FABER) test: negative  FUNCTIONAL TESTS:  6 minute walk test: 4 min 24 sec 30 sec sit to stand 13 reps GAIT: Distance walked: 600' Assistive device utilized: None Level of assistance: Modified independence Comments: gait pattern with + trendelenburg R, increased hip sway B, tends to scissor at time with gait.                                                                                                                                TREATMENT DATE: 09/11/23:  Nustep L 5 x 6 min seat 11 Ue's and LE's R hamstring stretch with therapist providing overpressure R post knee jt in supine R ankle distraction with pt performing quad sets for post capsule stretch Side lying L for clam shell R 4# 10 x 2  Side lying R hip abd no resistance 10 x 2  B knee ext 10lb 3x10 B LE B knee flexion 3x10 25# B LE  09/07/23 walk test- went whole time 1200 ft no break, some labored Sit to stands x 10 Wall squats 2x10 mini squats B knee ext 10lb 3x10 BLE B knee flexion 3x10 25# BLE Heel drop heel raise 2x10 B Lateral step downs 2' 2x10 BLE  09/04/23:  Nustep level 5 x 7 min Gait in hallway, 300'  pt with insert L shoe, walked well, no trendelenberg noted, no scissoring R hamstring stretch with therapist providing overpressure R post knee jt in supine R ankle distraction with pt performing quad sets for post capsule stretch B knee ext 15lb x 2 sets 15 BLE B knee flexion 2 x 15 25#  08/30/23: Nustep level 5 x  6 min UE's and LE's Side lying L for theragun massage R hip abductors, ITB Seated R HS stretch RLE x 30 B knee ext 15lb x 15 BLE; RLE 5lb x 15 Sidelying R hip abduction x 10  Standing hip abduction 3lb 2x10 BLE Standing HS curls 3lb 2x10 BLE Standing hip ext 3lb 2x10 BLE  08/28/23: Nustep level 5 x  6 min UE's and LE's Side lying L for theragun massage R  hip abductors Supine for distraction  R ankle with quad sets, 5 sec holds, x 10 reps  B knee ext 10#, con lift, eccentric lowering with R, x 10 reps x 3 B knee flex 25# CON lowering and eccentric raising with R LE Side lying for R hip clam shell with 3# cuff wt over R knee jt R hamstring stretch with therapist providing overpressure R post knee jt in supine Standing pillow case slides with L LE , R LE wbing to engage stability R lat hip musculature.   08/16/23: Manual: side lying L for cross friction massage primarily R TFL musculature, using theragun Supine for manual stretching by therapist for R hamstrings, 3 bouts Supine for AP glides R tibia, with femur supported on bolster, with tibia adduction direction, 3 bouts, to assist with improving R knee flexion motion tolerance Supine for patellar mobs R  Provided with rubber insert for L shoe as the cork is breaking down but working well.  Therapeutic exercise:  Bridging B, 15 x with sustained  abduction hips, with black t band around knees , cues to maintain tension for abduction Hooklying hip abd with black t band around knees 15x, needed cuing for placement Nustep level 5 x 6 min to address  activity tolerance/ endurance  08/14/23 Therapuaetic activities Added blue t band around SI to replicate SI belt and support glut medius B, gait with the belt with some improvement in gait pattern , less sway R pelvis, but pt reported fatgue R hip abductors so removed Insert 1/8 L shoe for symmetrical iliac crest height B, gait with insert with definite improvement with R lateral pelvic shift in wbing.  Advised to trial for 24 to 48 hrs and self assess.  Manual: Side lying L for cross friction massage primarily R TFL musculature, using theragun Supine for patellar mobs R , also cross fricoitn massage med and lat knee jt lines , pt with some tenderness there  Therex:  Instructed in bridging B, 15 x with sustained  abduction hips, with black t band  around knees  Hooklying hip abd with black t band around knees  15 x Provided with printed hand outs from [employer]bridge to follow on these 2 ex.  Nustep Level 4  Ue's and LE's, x 3 min 30 sec at end of session for endurance.   08/09/23  Evaluation, instructed in seated  or long sitting R ankle pumps with black theraband resistance 15 reps, 1 x day, education throughout in evaluation findings and gait, improving gait efficiency with reducing postural. Pelvic sway   PATIENT EDUCATION:  Education details: added new HEP Person educated: Patient Education method: Explanation, Demonstration, Tactile cues, Verbal cues, and Handouts Education comprehension: verbalized understanding  HOME EXERCISE PROGRAM: Access Code: [email or web address] URL: [email or web address] Date: 09/07/2023 Prepared by: Braylin Clark  Exercises - Wall Quarter Squat  - 1 x daily - 7 x weekly - 3 sets - 10 reps - Standing Bilateral Heel Raise on Step  - 1 x daily - 7  x weekly - 3 sets - 10 reps  ASSESSMENT:  CLINICAL IMPRESSION: Pt continues to respond well to progressed strengthening. We are stretching her R post knee capsule too as she is still flexed slightly.  Her gait pattern continues with less hip lurching/ sway.  Provided with another rubber insert for L shoe as the cork is compressed and torn, but working well.  She is to continue for her plan of care to 10/04/23.   Eval:Patient is a 73 y.o. female who was evaluated today by physical therapy  for recovery from a recent fall with IT band strain. Her pain has improved quite a bit, did find today weakness R plantarflexors and R hip abductors, and some loss of extension Rom R knee as compared to her final PT visit at the end of 2024 as she was recovering from unilateral knee arthroplasty.  Her endurance is compromised, unable to complete 6 min walk test.  Also she has unsteady gait with marked postural sway, has additional history of falls prior to her R knee surgery.   Would benefit from physical therapy intervention to address her deficits and assist her with improving her balance and gait efficiency.    OBJECTIVE IMPAIRMENTS: decreased activity tolerance, decreased balance, decreased endurance, decreased knowledge of condition, difficulty walking, decreased ROM, decreased strength, and decreased safety awareness.   ACTIVITY LIMITATIONS: carrying, lifting, stairs, locomotion level, and caring for others  PARTICIPATION LIMITATIONS: cleaning, laundry, shopping, community activity, and yard work  PERSONAL FACTORS: Age, Behavior pattern, Fitness, Past/current experiences, Time since onset of injury/illness/exacerbation, and 1-2 comorbidities: recent partial R knee replacement are also affecting patient's functional outcome.   REHAB POTENTIAL: Good  CLINICAL DECISION MAKING: Evolving/moderate complexity  EVALUATION COMPLEXITY: Moderate   GOALS: Goals reviewed with patient? Yes  SHORT TERM GOALS: Target date: 2 weeks 08/23/23 I HEP Baseline: Goal status: MET- 08/30/23   LONG TERM GOALS: Target date: 8 weeks 10/04/23  Strength R hip abd and plantarflexion improve to 5/5 from 4-/5 for increased stability with gait pattern Baseline:  Goal status:09/04/23: R hip abd improved today to 4+/5 with MMT, advanced resistance training  2.  Complete 6 min walk test, walking greater than 800'  Baseline: 4 min 24 sec and 600'  Goal status: INITIAL  3.  Improve R knee extension ROM to -5 or better from -11 for more efficient gait pattern Baseline:  Goal status: in progress 09/04/23: improved today to -8  4.  30 sec sit to stand improve from 13 reps to 16 Baseline:  Goal status:progressing 08/28/23; 14 reps improved       PLAN:  PT FREQUENCY: 2x/week  PT DURATION: 8 weeks  PLANNED INTERVENTIONS: 97110-Therapeutic exercises, 97530- Therapeutic activity, 97112- Neuromuscular re-education, 97535- Self Care, and [id number]- Manual therapy  PLAN FOR NEXT  SESSION: strengthening for R LE plantarflexors and hip abductors, balance and pre gait, gait activities to focus on reducing R hip trendelenburg   Kazzandra Desaulniers L Evaristo Tsuda, PT 09/11/2023, 1:11 PM [place] [facility] at [facility] [address]  [address] [place], [place], [address] Phone: [phone]   Fax:  [phone]  Patient Details  Name: Megan Crane MRN: [id number] Date of Birth: 07/28/1950 Referring Provider:  Edna Toribio LABOR, MD  Encounter Date: 09/11/2023   Greig LITTIE Credit, PT 09/11/2023, 1:11 PM  [place] [facility] at [facility] [address]  [address] [place], [place], [address] Phone: [phone]   Fax:  [phone] 

## 2023-09-14 ENCOUNTER — Ambulatory Visit

## 2023-09-14 DIAGNOSIS — M6281 Muscle weakness (generalized): Secondary | ICD-10-CM | POA: Diagnosis not present

## 2023-09-14 DIAGNOSIS — R2689 Other abnormalities of gait and mobility: Secondary | ICD-10-CM | POA: Diagnosis not present

## 2023-09-14 DIAGNOSIS — M25661 Stiffness of right knee, not elsewhere classified: Secondary | ICD-10-CM

## 2023-09-14 DIAGNOSIS — R262 Difficulty in walking, not elsewhere classified: Secondary | ICD-10-CM | POA: Diagnosis not present

## 2023-09-14 DIAGNOSIS — M25561 Pain in right knee: Secondary | ICD-10-CM | POA: Diagnosis not present

## 2023-09-14 DIAGNOSIS — R600 Localized edema: Secondary | ICD-10-CM

## 2023-09-14 NOTE — Therapy (Signed)
 OUTPATIENT PHYSICAL THERAPY LOWER EXTREMITY TREATMENT   Patient Name: Megan Crane MRN: [id number] DOB:[birth year], 73 y.o., female Today's Date: 09/14/2023  END OF SESSION:  PT End of Session - 09/14/23 1022     Visit Number 9    Date for PT Re-Evaluation 10/04/23    Progress Note Due on Visit 10    PT Start Time 1017    PT Stop Time 1105    PT Time Calculation (min) 48 min    Activity Tolerance Patient tolerated treatment well    Behavior During Therapy WFL for tasks assessed/performed                 Past Medical History:  Diagnosis Date   Anxiety disorder    hx vasovagal responses   Arthritis    Cancer (HCC)    Deep vein thrombosis (DVT) (HCC)    hx of in left left after covid in 2021   Headache(784.0)    History of kidney stones    Hydronephrosis, right    Hyperlipidemia    Hypertension    Nephrolithiasis    Pneumonia    Positive nasal culture for methicillin resistant Staphylococcus aureus    Right ureteral stone    Thrombophlebitis of leg, left, superficial 11/17/2020   Urinary incontinence    sees Dr. McDiarmid    Vasovagal syncope 08/02/2022   Past Surgical History:  Procedure Laterality Date   BLADDER REPAIR     COLONOSCOPY  04/26/2021   per Dr. Leigh, diffuse diverticulosis, rectosigmoid colitis with narrowed lumen, repeat in 5 yrs   CYSTOSCOPY  06/09/2011   Procedure: CYSTOSCOPY;  Surgeon: Glendia DELENA Elizabeth, MD;  Location: [facility];  Service: Urology;  Laterality: N/A;   CYSTOSCOPY W/ URETERAL STENT PLACEMENT  02/03/2014   Procedure: CYSTOSCOPY WITH  RIGHT RETROGRADE PYELOGRAM/ RIGHT URETERAL STENT PLACEMENT;  Surgeon: Norleen JINNY Seltzer, MD;  Location: [facility];  Service: Urology;;   CYSTOSCOPY WITH BIOPSY  02/03/2014   Procedure: CYSTOSCOPY WITH BIOPSY;  Surgeon: Norleen JINNY Seltzer, MD;  Location: [facility];  Service: Urology;;   PARTIAL KNEE ARTHROPLASTY Right 09/18/2022   Procedure: UNICOMPARTMENTAL KNEE;   Surgeon: Edna Toribio DELENA, MD;  Location: [facility];  Service: Orthopedics;  Laterality: Right;   PILONIDAL CYST EXCISION  1974   RECTOCELE REPAIR  06/09/2011   Procedure: POSTERIOR REPAIR (RECTOCELE);  Surgeon: Glendia DELENA Elizabeth, MD;  Location: [facility];  Service: Urology;  Laterality: N/A;  Xenform graft 6x10   RIGHT URETEROSCOPIC STONE EXTRACTION / STENT PLACEMENT  03/13/2000   URETEROSCOPY Right 02/03/2014   Procedure: URETEROSCOPY WITH MANAGEMENT OF URETERAL STRICTURE;  Surgeon: Norleen JINNY Seltzer, MD;  Location: [facility];  Service: Urology;  Laterality: Right;   VAGINAL HYSTERECTOMY  06/09/2011   Procedure: HYSTERECTOMY VAGINAL;  Surgeon: Charlie JONETTA Aho, MD;  Location: [facility];  Service: Gynecology;  Laterality: N/A;   VAGINAL PROLAPSE REPAIR  06/09/2011   Procedure: VAGINAL VAULT SUSPENSION;  Surgeon: Glendia DELENA Elizabeth, MD;  Location: [facility];  Service: Urology;  Laterality: N/A;   VEIN LIGATION AND STRIPPING     LEFT LEG   Patient Active Problem List   Diagnosis Date Noted   S/P right unicompartmental knee replacement 09/18/2022   IC (interstitial cystitis) 09/04/2022   Pelvic pressure in female 08/22/2022   Visit for pre-operative examination 08/07/2022   Vasovagal syncope 08/02/2022   Nasal dryness 06/30/2022   Precancerous skin lesion 06/30/2022   Bilateral carpal tunnel syndrome 03/02/2022  Swelling of both hands 03/02/2022   Atypical chest pain 03/02/2022   OAB (overactive bladder) 11/23/2021   Corn of foot 11/23/2021   Atrophic vaginitis 11/23/2021   COVID-19 virus infection 11/17/2020   Raynaud's phenomenon without gangrene 11/17/2020   Primary osteoarthritis involving multiple joints 11/24/2019   GERD (gastroesophageal reflux disease) 12/26/2016   Edema 05/29/2014   Nasal colonization with methicillin-resistant Staphylococcus aureus 01/22/2014   Varicose veins of bilateral lower extremities with other complications 12/11/2012   Allergic rhinitis  11/29/2009   MOTION SICKNESS 11/29/2009   Hyperlipidemia 08/31/2008   Anxiety state 08/31/2008   Primary hypertension 08/31/2008   PILONIDAL CYST 08/31/2008   HEADACHE 08/31/2008   URINARY INCONTINENCE 08/31/2008   NEPHROLITHIASIS, HX OF 08/31/2008   POSTNASAL DRIP SYNDROME 05/28/2006    PCP: Johnny Senior, MD   REFERRING PROVIDER: Edna Sieving, MD  REFERRING DIAG: R IT  band syndrome  THERAPY DIAG:  Muscle weakness (generalized)  Difficulty in walking, not elsewhere classified  Stiffness of right knee, not elsewhere classified  Other abnormalities of gait and mobility  Localized edema  Acute pain of right knee  Rationale for Evaluation and Treatment: Rehabilitation  ONSET DATE: 06/29/23  SUBJECTIVE:   SUBJECTIVE STATEMENT: Knelt down earlier on L knee and used R knee to get up, feeling now in R Knee after that  Eval:Fell in hallway, dog had accident , L foot slipped out, landed on R lat knee with knee bent.  Saw orthopedist who did the partial knee replacement last year and they referred me here.  Then I had diverticulitis, then bronchitis, so I've been really weak and my immunity/ stamina has been depleted.  Noticing R ant knee and lower leg pulls, aches when I bend it a lot.  More prevalent problem currently is my stamina  PERTINENT HISTORY: Referred by orthopedist PAIN:  Are you having pain? Reports mild pain R post knee and ant lower leg, knee with specific positions, no interference with sleep or most functional activities  PRECAUTIONS: None  RED FLAGS: None   WEIGHT BEARING RESTRICTIONS: No  FALLS:  Has patient fallen in last 6 months? Yes. Number of falls 1  LIVING ENVIRONMENT: Lives with: lives with their family and lives with their spouse Lives in: House/apartment Stairs: No Has following equipment at home: Single point cane  OCCUPATION: retired  PLOF: Independent  PATIENT GOALS: return to normal with endurance, activity  tolerance  NEXT MD VISIT: unclear  OBJECTIVE:  Note: Objective measures were completed at Evaluation unless otherwise noted.  DIAGNOSTIC FINDINGS: orthopedist radiology results wnl  PATIENT SURVEYS:  PSFS: THE PATIENT SPECIFIC FUNCTIONAL SCALE  Place score of 0-10 (0 = unable to perform activity and 10 = able to perform activity at the same level as before injury or problem)  Activity Date: 08/09/23    Getting out of car and standing up 5    2.  Walking through grocery store 5    3.  Prolonged sitting  8    4.      Total Score 18      Total Score = Sum of activity scores/number of activities  Minimally Detectable Change: 3 points (for single activity); 2 points (for average score)  [facility] (nd). The Patient Specific Functional Scale . Retrieved from [email or web address]   COGNITION: Overall cognitive status: Within functional limits for tasks assessed     SENSATION: WFL  EDEMA:  Mild visible distal R thigh  MUSCLE LENGTH: Hamstrings: Right wnl deg; Left wnl  deg Debby test: Right nt deg; Left nt deg  POSTURE: anterior pelvic tilt and R knee flexion 10 degrees in standing  PALPATION: Mild tenderness over lateral trochanter R hip and R IT band  LOWER EXTREMITY ROM:  AA ROM Right eval Left eval R  09/04/23  Hip flexion     Hip extension     Hip abduction     Hip adduction     Hip internal rotation     Hip external rotation     Knee flexion 116  130  Knee extension -8    Ankle dorsiflexion     Ankle plantarflexion     Ankle inversion     Ankle eversion      (Blank rows = not tested)  LOWER EXTREMITY MMT:  MMT Right eval Left eval  Hip flexion    Hip extension    Hip abduction 4-   Hip adduction    Hip internal rotation    Hip external rotation 5   Knee flexion    Knee extension    Ankle dorsiflexion    Ankle plantarflexion 4-   Ankle inversion    Ankle eversion     (Blank  rows = not tested)  LOWER EXTREMITY SPECIAL TESTS:  Hip special tests: Belvie (FABER) test: negative  FUNCTIONAL TESTS:  6 minute walk test: 4 min 24 sec 30 sec sit to stand 13 reps GAIT: Distance walked: 600' Assistive device utilized: None Level of assistance: Modified independence Comments: gait pattern with + trendelenburg R, increased hip sway B, tends to scissor at time with gait.                                                                                                                                TREATMENT DATE: 09/14/23:  Nustep L 5 x 8 min seat 11 Ue's and LE's Squats with counter support 2x10 BLE lunges touching 9' stool x 10; progressed to touching BOSU ball x 10 Lateral lunges BLE 2 x 10 S/L R hip abduction 2# 2x10 S/L R clamshell 4# 2x10 SLR 2# RLE 2x10  09/11/23:  Nustep L 5 x 6 min seat 11 Ue's and LE's R hamstring stretch with therapist providing overpressure R post knee jt in supine R ankle distraction with pt performing quad sets for post capsule stretch Side lying L for clam shell R 4# 10 x 2  Side lying R hip abd no resistance 10 x 2  B knee ext 10lb 3x10 B LE B knee flexion 3x10 25# B LE  09/07/23 walk test- went whole time 1200 ft no break, some labored Sit to stands x 10 Wall squats 2x10 mini squats B knee ext 10lb 3x10 BLE B knee flexion 3x10 25# BLE Heel drop heel raise 2x10 B Lateral step downs 2' 2x10 BLE  09/04/23:  Nustep level 5 x 7 min Gait in hallway, 300'  pt with insert L shoe, walked well,  no trendelenberg noted, no scissoring R hamstring stretch with therapist providing overpressure R post knee jt in supine R ankle distraction with pt performing quad sets for post capsule stretch B knee ext 15lb x 2 sets 15 BLE B knee flexion 2 x 15 25#  08/30/23: Nustep level 5 x  6 min UE's and LE's Side lying L for theragun massage R hip abductors, ITB Seated R HS stretch RLE x 30 B knee ext 15lb x 15 BLE; RLE 5lb x 15 Sidelying  R hip abduction x 10  Standing hip abduction 3lb 2x10 BLE Standing HS curls 3lb 2x10 BLE Standing hip ext 3lb 2x10 BLE  08/28/23: Nustep level 5 x  6 min UE's and LE's Side lying L for theragun massage R hip abductors Supine for distraction  R ankle with quad sets, 5 sec holds, x 10 reps  B knee ext 10#, con lift, eccentric lowering with R, x 10 reps x 3 B knee flex 25# CON lowering and eccentric raising with R LE Side lying for R hip clam shell with 3# cuff wt over R knee jt R hamstring stretch with therapist providing overpressure R post knee jt in supine Standing pillow case slides with L LE , R LE wbing to engage stability R lat hip musculature.   08/16/23: Manual: side lying L for cross friction massage primarily R TFL musculature, using theragun Supine for manual stretching by therapist for R hamstrings, 3 bouts Supine for AP glides R tibia, with femur supported on bolster, with tibia adduction direction, 3 bouts, to assist with improving R knee flexion motion tolerance Supine for patellar mobs R  Provided with rubber insert for L shoe as the cork is breaking down but working well.  Therapeutic exercise:  Bridging B, 15 x with sustained  abduction hips, with black t band around knees , cues to maintain tension for abduction Hooklying hip abd with black t band around knees 15x, needed cuing for placement Nustep level 5 x 6 min to address  activity tolerance/ endurance  08/14/23 Therapuaetic activities Added blue t band around SI to replicate SI belt and support glut medius B, gait with the belt with some improvement in gait pattern , less sway R pelvis, but pt reported fatgue R hip abductors so removed Insert 1/8 L shoe for symmetrical iliac crest height B, gait with insert with definite improvement with R lateral pelvic shift in wbing.  Advised to trial for 24 to 48 hrs and self assess.  Manual: Side lying L for cross friction massage primarily R TFL musculature, using  theragun Supine for patellar mobs R , also cross fricoitn massage med and lat knee jt lines , pt with some tenderness there  Therex:  Instructed in bridging B, 15 x with sustained  abduction hips, with black t band around knees  Hooklying hip abd with black t band around knees  15 x Provided with printed hand outs from [employer]bridge to follow on these 2 ex.  Nustep Level 4  Ue's and LE's, x 3 min 30 sec at end of session for endurance.   08/09/23  Evaluation, instructed in seated  or long sitting R ankle pumps with black theraband resistance 15 reps, 1 x day, education throughout in evaluation findings and gait, improving gait efficiency with reducing postural. Pelvic sway   PATIENT EDUCATION:  Education details: added new HEP Person educated: Patient Education method: Explanation, Demonstration, Tactile cues, Verbal cues, and Handouts Education comprehension: verbalized understanding  HOME EXERCISE  PROGRAM: Access Code: [email or web address] URL: [email or web address] Date: 09/07/2023 Prepared by: Kalli Greenfield  Exercises - Wall Quarter Squat  - 1 x daily - 7 x weekly - 3 sets - 10 reps - Standing Bilateral Heel Raise on Step  - 1 x daily - 7 x weekly - 3 sets - 10 reps  ASSESSMENT:  CLINICAL IMPRESSION: Continued progressing functional strengthening activities to improve ability to squat down and kneel. Pt showed good tolerance the interventions. Knee AROM looks good w/o pain with end ranges. Will continue progressing as tolerated.   Eval:Patient is a 73 y.o. female who was evaluated today by physical therapy  for recovery from a recent fall with IT band strain. Her pain has improved quite a bit, did find today weakness R plantarflexors and R hip abductors, and some loss of extension Rom R knee as compared to her final PT visit at the end of 2024 as she was recovering from unilateral knee arthroplasty.  Her endurance is compromised, unable to complete 6 min walk test.  Also she has  unsteady gait with marked postural sway, has additional history of falls prior to her R knee surgery.  Would benefit from physical therapy intervention to address her deficits and assist her with improving her balance and gait efficiency.    OBJECTIVE IMPAIRMENTS: decreased activity tolerance, decreased balance, decreased endurance, decreased knowledge of condition, difficulty walking, decreased ROM, decreased strength, and decreased safety awareness.   ACTIVITY LIMITATIONS: carrying, lifting, stairs, locomotion level, and caring for others  PARTICIPATION LIMITATIONS: cleaning, laundry, shopping, community activity, and yard work  PERSONAL FACTORS: Age, Behavior pattern, Fitness, Past/current experiences, Time since onset of injury/illness/exacerbation, and 1-2 comorbidities: recent partial R knee replacement are also affecting patient's functional outcome.   REHAB POTENTIAL: Good  CLINICAL DECISION MAKING: Evolving/moderate complexity  EVALUATION COMPLEXITY: Moderate   GOALS: Goals reviewed with patient? Yes  SHORT TERM GOALS: Target date: 2 weeks 08/23/23 I HEP Baseline: Goal status: MET- 08/30/23   LONG TERM GOALS: Target date: 8 weeks 10/04/23  Strength R hip abd and plantarflexion improve to 5/5 from 4-/5 for increased stability with gait pattern Baseline:  Goal status:09/04/23: R hip abd improved today to 4+/5 with MMT, advanced resistance training  2.  Complete 6 min walk test, walking greater than 800'  Baseline: 4 min 24 sec and 600'  Goal status: INITIAL  3.  Improve R knee extension ROM to -5 or better from -11 for more efficient gait pattern Baseline:  Goal status: in progress 09/04/23: improved today to -8  4.  30 sec sit to stand improve from 13 reps to 16 Baseline:  Goal status:progressing 08/28/23; 14 reps improved       PLAN:  PT FREQUENCY: 2x/week  PT DURATION: 8 weeks  PLANNED INTERVENTIONS: 97110-Therapeutic exercises, 97530- Therapeutic  activity, 97112- Neuromuscular re-education, 97535- Self Care, and 02859- Manual therapy  PLAN FOR NEXT SESSION: strengthening for R LE plantarflexors and hip abductors, balance and pre gait, gait activities to focus on reducing R hip trendelenburg   Megan Crane, PTA 09/14/2023, 11:57 AM [facility] at [facility] [address]  [address] [place], [place], [address] Phone: [phone]   Fax:  [phone]  Patient Details  Name: Megan Crane MRN: [id number] Date of Birth: [birth year] Referring Provider:  Edna Toribio LABOR, MD  Encounter Date: 09/14/2023   Megan Crane, PTA 09/14/2023, 11:57 AM  [facility] at [facility] [phone]  [address]  [address] [place], [place], [address] Phone: [phone]   Fax:  [phone]

## 2023-09-18 ENCOUNTER — Ambulatory Visit

## 2023-09-19 ENCOUNTER — Telehealth: Payer: Self-pay | Admitting: *Deleted

## 2023-09-19 MED ORDER — MUPIROCIN 2 % EX OINT: 1.0000 | TOPICAL_OINTMENT | Freq: Two times a day (BID) | CUTANEOUS | 5 refills | Status: AC

## 2023-09-19 NOTE — Telephone Encounter (Signed)
 Copied from CRM [id number]. Topic: Clinical - Medication Question >> Sep 19, 2023  3:06 PM Lauren C wrote: Reason for CRM: Pt is requesting a topical medication that she has been prescribed in the past by Dr. Johnny for dry sores in her nose that she says she gets from the dry weather. She thinks it has been about 3 years since she got it filled and is unsure of what it was called. She is using the [employer] in [place]. Her # is [phone] [facility] Phone) or [phone] secondary. She says okay to leave full VM.

## 2023-09-19 NOTE — Telephone Encounter (Signed)
 This was Mupiricin and I refilled it

## 2023-09-20 ENCOUNTER — Telehealth: Payer: Self-pay | Admitting: *Deleted

## 2023-09-20 ENCOUNTER — Ambulatory Visit

## 2023-09-20 NOTE — Telephone Encounter (Signed)
 Attempted to call pt regarding Rx refill mailbox is full will try again later

## 2023-09-20 NOTE — Telephone Encounter (Signed)
 Copied from CRM #[id number]. Topic: General - Other >> Sep 20, 2023 11:36 AM [place] J wrote: Reason for CRM: Patient returning a call from the clinic. I was able to notify her of the approved ointment and she has acknowledged.

## 2023-09-21 NOTE — Telephone Encounter (Signed)
 Noted

## 2023-09-26 ENCOUNTER — Ambulatory Visit

## 2023-09-28 ENCOUNTER — Ambulatory Visit: Admitting: Rehabilitation

## 2023-09-28 ENCOUNTER — Other Ambulatory Visit: Payer: Self-pay | Admitting: Family Medicine

## 2023-09-28 DIAGNOSIS — R600 Localized edema: Secondary | ICD-10-CM | POA: Diagnosis not present

## 2023-09-28 DIAGNOSIS — M25661 Stiffness of right knee, not elsewhere classified: Secondary | ICD-10-CM

## 2023-09-28 DIAGNOSIS — M25561 Pain in right knee: Secondary | ICD-10-CM | POA: Diagnosis not present

## 2023-09-28 DIAGNOSIS — M6281 Muscle weakness (generalized): Secondary | ICD-10-CM | POA: Diagnosis not present

## 2023-09-28 DIAGNOSIS — R2689 Other abnormalities of gait and mobility: Secondary | ICD-10-CM

## 2023-09-28 DIAGNOSIS — R262 Difficulty in walking, not elsewhere classified: Secondary | ICD-10-CM

## 2023-09-28 DIAGNOSIS — I10 Essential (primary) hypertension: Secondary | ICD-10-CM

## 2023-09-28 NOTE — Therapy (Addendum)
 OUTPATIENT PHYSICAL THERAPY LOWER EXTREMITY TREATMENT / PROGRESS NOTE   Patient Name: Megan Crane MRN: [id number] DOB:06/28/1950, 73 y.o., female Today's Date: 09/28/2023  END OF SESSION:           Past Medical History:  Diagnosis Date   Anxiety disorder    hx vasovagal responses   Arthritis    Cancer (HCC)    Deep vein thrombosis (DVT) (HCC)    hx of in left left after covid in 2021   Headache(784.0)    History of kidney stones    Hydronephrosis, right    Hyperlipidemia    Hypertension    Nephrolithiasis    Pneumonia    Positive nasal culture for methicillin resistant Staphylococcus aureus    Right ureteral stone    Thrombophlebitis of leg, left, superficial 11/17/2020   Urinary incontinence    sees Dr. McDiarmid    Vasovagal syncope 08/02/2022   Past Surgical History:  Procedure Laterality Date   BLADDER REPAIR     COLONOSCOPY  04/26/2021   per Dr. Leigh, diffuse diverticulosis, rectosigmoid colitis with narrowed lumen, repeat in 5 yrs   CYSTOSCOPY  06/09/2011   Procedure: CYSTOSCOPY;  Surgeon: Glendia DELENA Elizabeth, MD;  Location: [facility];  Service: Urology;  Laterality: N/A;   CYSTOSCOPY W/ URETERAL STENT PLACEMENT  02/03/2014   Procedure: CYSTOSCOPY WITH  RIGHT RETROGRADE PYELOGRAM/ RIGHT URETERAL STENT PLACEMENT;  Surgeon: Norleen JINNY Seltzer, MD;  Location: [facility];  Service: Urology;;   CYSTOSCOPY WITH BIOPSY  02/03/2014   Procedure: CYSTOSCOPY WITH BIOPSY;  Surgeon: Norleen JINNY Seltzer, MD;  Location: [facility];  Service: Urology;;   PARTIAL KNEE ARTHROPLASTY Right 09/18/2022   Procedure: UNICOMPARTMENTAL KNEE;  Surgeon: Edna Toribio DELENA, MD;  Location: [facility];  Service: Orthopedics;  Laterality: Right;   PILONIDAL CYST EXCISION  1974   RECTOCELE REPAIR  06/09/2011   Procedure: POSTERIOR REPAIR (RECTOCELE);  Surgeon: Glendia DELENA Elizabeth, MD;  Location: [facility];  Service: Urology;  Laterality: N/A;  Xenform graft 6x10    RIGHT URETEROSCOPIC STONE EXTRACTION / STENT PLACEMENT  03/13/2000   URETEROSCOPY Right 02/03/2014   Procedure: URETEROSCOPY WITH MANAGEMENT OF URETERAL STRICTURE;  Surgeon: Norleen JINNY Seltzer, MD;  Location: [facility];  Service: Urology;  Laterality: Right;   VAGINAL HYSTERECTOMY  06/09/2011   Procedure: HYSTERECTOMY VAGINAL;  Surgeon: Charlie JONETTA Aho, MD;  Location: [facility];  Service: Gynecology;  Laterality: N/A;   VAGINAL PROLAPSE REPAIR  06/09/2011   Procedure: VAGINAL VAULT SUSPENSION;  Surgeon: Glendia DELENA Elizabeth, MD;  Location: [facility];  Service: Urology;  Laterality: N/A;   VEIN LIGATION AND STRIPPING     LEFT LEG   Patient Active Problem List   Diagnosis Date Noted   S/P right unicompartmental knee replacement 09/18/2022   IC (interstitial cystitis) 09/04/2022   Pelvic pressure in female 08/22/2022   Visit for pre-operative examination 08/07/2022   Vasovagal syncope 08/02/2022   Nasal dryness 06/30/2022   Precancerous skin lesion 06/30/2022   Bilateral carpal tunnel syndrome 03/02/2022   Swelling of both hands 03/02/2022   Atypical chest pain 03/02/2022   OAB (overactive bladder) 11/23/2021   Corn of foot 11/23/2021   Atrophic vaginitis 11/23/2021   COVID-19 virus infection 11/17/2020   Raynaud's phenomenon without gangrene 11/17/2020   Primary osteoarthritis involving multiple joints 11/24/2019   GERD (gastroesophageal reflux disease) 12/26/2016   Edema 05/29/2014   Nasal colonization with methicillin-resistant Staphylococcus aureus 01/22/2014   Varicose veins of bilateral  lower extremities with other complications 12/11/2012   Allergic rhinitis 11/29/2009   MOTION SICKNESS 11/29/2009   Hyperlipidemia 08/31/2008   Anxiety state 08/31/2008   Primary hypertension 08/31/2008   PILONIDAL CYST 08/31/2008   HEADACHE 08/31/2008   URINARY INCONTINENCE 08/31/2008   NEPHROLITHIASIS, HX OF 08/31/2008   POSTNASAL DRIP SYNDROME 05/28/2006    PCP: Johnny Senior, MD    REFERRING PROVIDER: Edna Sieving, MD  REFERRING DIAG: R IT  band syndrome  THERAPY DIAG:  No diagnosis found.  Rationale for Evaluation and Treatment: Rehabilitation  ONSET DATE: 06/29/23  SUBJECTIVE:   SUBJECTIVE STATEMENT: States feels better today.  States still has some soreness in the R lateral thigh, but it is not bothering her much at all now.   Eval:Fell in hallway, dog had accident , L foot slipped out, landed on R lat knee with knee bent.  Saw orthopedist who did the partial knee replacement last year and they referred me here.  Then I had diverticulitis, then bronchitis, so I've been really weak and my immunity/ stamina has been depleted.  Noticing R ant knee and lower leg pulls, aches when I bend it a lot.  More prevalent problem currently is my stamina  PERTINENT HISTORY: Referred by orthopedist PAIN:  Are you having pain? Reports mild pain R post knee and ant lower leg, knee with specific positions, no interference with sleep or most functional activities  PRECAUTIONS: None  RED FLAGS: None   WEIGHT BEARING RESTRICTIONS: No  FALLS:  Has patient fallen in last 6 months? Yes. Number of falls 1  LIVING ENVIRONMENT: Lives with: lives with their family and lives with their spouse Lives in: House/apartment Stairs: No Has following equipment at home: Single point cane  OCCUPATION: retired  PLOF: Independent  PATIENT GOALS: return to normal with endurance, activity tolerance  NEXT MD VISIT: unclear  OBJECTIVE:  Note: Objective measures were completed at Evaluation unless otherwise noted.  DIAGNOSTIC FINDINGS: orthopedist radiology results wnl  PATIENT SURVEYS:  PSFS: THE PATIENT SPECIFIC FUNCTIONAL SCALE  Place score of 0-10 (0 = unable to perform activity and 10 = able to perform activity at the same level as before injury or problem)  Activity Date: 08/09/23    Getting out of car and standing up 5    2.  Walking through grocery store 5     3.  Prolonged sitting  8    4.      Total Score 18      Total Score = Sum of activity scores/number of activities  Minimally Detectable Change: 3 points (for single activity); 2 points (for average score)  [facility] (nd). The Patient Specific Functional Scale . Retrieved from [email or web address]   COGNITION: Overall cognitive status: Within functional limits for tasks assessed     SENSATION: WFL  EDEMA:  Mild visible distal R thigh  MUSCLE LENGTH: Hamstrings: Right wnl deg; Left wnl deg Debby test: Right nt deg; Left nt deg  POSTURE: anterior pelvic tilt and R knee flexion 10 degrees in standing  PALPATION: Mild tenderness over lateral trochanter R hip and R IT band  LOWER EXTREMITY ROM:  AA ROM Right eval Left eval R  09/04/23 RLE 09/28/23 LLE 09/28/23  Hip flexion       Hip extension       Hip abduction       Hip adduction       Hip internal rotation       Hip external rotation  Knee flexion 116  130 132 138  Knee extension -8   -4 0  Ankle dorsiflexion       Ankle plantarflexion       Ankle inversion       Ankle eversion        (Blank rows = not tested)  LOWER EXTREMITY MMT:  MMT Right eval Left eval RLE 09/28/23  Hip flexion     Hip extension     Hip abduction 4-  4  Hip adduction     Hip internal rotation   4  Hip external rotation 5  5  Knee flexion   4  Knee extension   4+  Ankle dorsiflexion     Ankle plantarflexion 4-    Ankle inversion     Ankle eversion      (Blank rows = not tested)  LOWER EXTREMITY SPECIAL TESTS:  Hip special tests: Belvie (FABER) test: negative  FUNCTIONAL TESTS:  6 minute walk test: 4 min 24 sec 30 sec sit to stand 13 reps GAIT: Distance walked: 600' Assistive device utilized: None Level of assistance: Modified independence Comments: gait pattern with + trendelenburg R, increased hip sway B, tends to scissor at time with gait.                                                                                                                                 TREATMENT DATE: 09/28/23 NuStep L5 x 8' BUE/BLE  Strength and ROM rechecked BOSU lunges x 2/10 RLE Seated hip ER blue TB x 2/10 BLE Seated knee flexion blue TB x 2/10 RLE Standing hip abd RTB x 2/10 RLE Standing hip ext RTB x 2/10 RLE Seated ham string stretch x 1'x 2 RLE SL hip abd x 2/10 RLE Supine cross over piriformis stretch x 1' x 2 RLE Seated knee extension 5# x 2/10 RLE   09/14/23:  Nustep L 5 x 8 min seat 11 Ue's and LE's Squats with counter support 2x10 BLE lunges touching 9' stool x 10; progressed to touching BOSU ball x 10 Lateral lunges BLE 2 x 10 S/L R hip abduction 2# 2x10 S/L R clamshell 4# 2x10 SLR 2# RLE 2x10  09/11/23:  Nustep L 5 x 6 min seat 11 Ue's and LE's R hamstring stretch with therapist providing overpressure R post knee jt in supine R ankle distraction with pt performing quad sets for post capsule stretch Side lying L for clam shell R 4# 10 x 2  Side lying R hip abd no resistance 10 x 2  B knee ext 10lb 3x10 B LE B knee flexion 3x10 25# B LE  09/07/23 walk test- went whole time 1200 ft no break, some labored Sit to stands x 10 Wall squats 2x10 mini squats B knee ext 10lb 3x10 BLE B knee flexion 3x10 25# BLE Heel drop heel raise 2x10 B Lateral step downs 2' 2x10 BLE  09/04/23:  Nustep  level 5 x 7 min Gait in hallway, 300'  pt with insert L shoe, walked well, no trendelenberg noted, no scissoring R hamstring stretch with therapist providing overpressure R post knee jt in supine R ankle distraction with pt performing quad sets for post capsule stretch B knee ext 15lb x 2 sets 15 BLE B knee flexion 2 x 15 25#  08/30/23: Nustep level 5 x  6 min UE's and LE's Side lying L for theragun massage R hip abductors, ITB Seated R HS stretch RLE x 30 B knee ext 15lb x 15 BLE; RLE 5lb x 15 Sidelying R hip abduction x 10   Standing hip abduction 3lb 2x10 BLE Standing HS curls 3lb 2x10 BLE Standing hip ext 3lb 2x10 BLE  08/28/23: Nustep level 5 x  6 min UE's and LE's Side lying L for theragun massage R hip abductors Supine for distraction  R ankle with quad sets, 5 sec holds, x 10 reps  B knee ext 10#, con lift, eccentric lowering with R, x 10 reps x 3 B knee flex 25# CON lowering and eccentric raising with R LE Side lying for R hip clam shell with 3# cuff wt over R knee jt R hamstring stretch with therapist providing overpressure R post knee jt in supine Standing pillow case slides with L LE , R LE wbing to engage stability R lat hip musculature.   08/16/23: Manual: side lying L for cross friction massage primarily R TFL musculature, using theragun Supine for manual stretching by therapist for R hamstrings, 3 bouts Supine for AP glides R tibia, with femur supported on bolster, with tibia adduction direction, 3 bouts, to assist with improving R knee flexion motion tolerance Supine for patellar mobs R  Provided with rubber insert for L shoe as the cork is breaking down but working well.  Therapeutic exercise:  Bridging B, 15 x with sustained  abduction hips, with black t band around knees , cues to maintain tension for abduction Hooklying hip abd with black t band around knees 15x, needed cuing for placement Nustep level 5 x 6 min to address  activity tolerance/ endurance  08/14/23 Therapuaetic activities Added blue t band around SI to replicate SI belt and support glut medius B, gait with the belt with some improvement in gait pattern , less sway R pelvis, but pt reported fatgue R hip abductors so removed Insert 1/8 L shoe for symmetrical iliac crest height B, gait with insert with definite improvement with R lateral pelvic shift in wbing.  Advised to trial for 24 to 48 hrs and self assess.  Manual: Side lying L for cross friction massage primarily R TFL musculature, using theragun Supine for patellar  mobs R , also cross fricoitn massage med and lat knee jt lines , pt with some tenderness there  Therex:  Instructed in bridging B, 15 x with sustained  abduction hips, with black t band around knees  Hooklying hip abd with black t band around knees  15 x Provided with printed hand outs from medbridge to follow on these 2 ex.  Nustep Level 4  Ue's and LE's, x 3 min 30 sec at end of session for endurance.   08/09/23  Evaluation, instructed in seated  or long sitting R ankle pumps with black theraband resistance 15 reps, 1 x day, education throughout in evaluation findings and gait, improving gait efficiency with reducing postural. Pelvic sway   PATIENT EDUCATION:  Education details: added new HEP Person educated: Patient  Education method: Explanation, Demonstration, Tactile cues, Verbal cues, and Handouts Education comprehension: verbalized understanding  HOME EXERCISE PROGRAM: Access Code: [email or web address] URL: [email or web address] Date: 09/07/2023 Prepared by: Braylin Clark  Exercises - Wall Quarter Squat  - 1 x daily - 7 x weekly - 3 sets - 10 reps - Standing Bilateral Heel Raise on Step  - 1 x daily - 7 x weekly - 3 sets - 10 reps  ASSESSMENT:  CLINICAL IMPRESSION:  Patient has been seen x 10 PT visits for IT band syndrome and RLE pain.   She is making good progress to goals and her gait is much improved.   She still exhibits some Trendelenburg gait pattern, but not nearly as much as described on evaluation visit.  She is having much less pain as well.   She is pleased with her progress, but has not yet met the goals that we have set for her.  However, she has good rehab potential to meet her goals.  PT remains necessary for residual strength, gait, and pain deficits.   Continue per POC   Eval:Patient is a 73 y.o. female who was evaluated today by physical therapy  for recovery from a recent fall with IT band strain. Her pain has improved quite a bit, did find today weakness  R plantarflexors and R hip abductors, and some loss of extension Rom R knee as compared to her final PT visit at the end of 2024 as she was recovering from unilateral knee arthroplasty.  Her endurance is compromised, unable to complete 6 min walk test.  Also she has unsteady gait with marked postural sway, has additional history of falls prior to her R knee surgery.  Would benefit from physical therapy intervention to address her deficits and assist her with improving her balance and gait efficiency.    OBJECTIVE IMPAIRMENTS: decreased activity tolerance, decreased balance, decreased endurance, decreased knowledge of condition, difficulty walking, decreased ROM, decreased strength, and decreased safety awareness.   ACTIVITY LIMITATIONS: carrying, lifting, stairs, locomotion level, and caring for others  PARTICIPATION LIMITATIONS: cleaning, laundry, shopping, community activity, and yard work  PERSONAL FACTORS: Age, Behavior pattern, Fitness, Past/current experiences, Time since onset of injury/illness/exacerbation, and 1-2 comorbidities: recent partial R knee replacement are also affecting patient's functional outcome.   REHAB POTENTIAL: Good  CLINICAL DECISION MAKING: Evolving/moderate complexity  EVALUATION COMPLEXITY: Moderate   GOALS: Goals reviewed with patient? Yes  SHORT TERM GOALS: Target date: 2 weeks 08/23/23 I HEP Baseline: Goal status: MET- 08/30/23   LONG TERM GOALS: Target date: 8 weeks 10/04/23  Strength R hip abd and plantarflexion improve to 5/5 from 4-/5 for increased stability with gait pattern Baseline:  09/27/23:  Advancing--see MMT tables above Goal status: IN PROGRESS  2.  Complete 6 min walk test, walking greater than 800'  Baseline: 4 min 24 sec and 600'  Goal status: INITIAL  3.  Improve R knee extension ROM to -5 or better from -11 for more efficient gait pattern Baseline:  9/18:   see ROM table above Goal status: MET   4.  30 sec sit to stand  improve from 13 reps to 16 Baseline:  Goal status:progressing 08/28/23; 14 reps improved       PLAN:  PT FREQUENCY: 2x/week  PT DURATION: 8 weeks  PLANNED INTERVENTIONS: 97110-Therapeutic exercises, 97530- Therapeutic activity, 97112- Neuromuscular re-education, 97535- Self Care, and 02859- Manual therapy  PLAN FOR NEXT SESSION: strengthening for R LE plantarflexors and hip abductors, balance and pre gait, gait activities to  focus on reducing R hip trendelenburg   Katalyna Socarras, PT 09/28/2023, 8:50 AM [place] [facility] at [facility] [address]  [address] [place], [place], [address] Phone: [phone]   Fax:  [phone]  Patient Details  Name: Megan Crane MRN: [id number] Date of Birth: [birth year] Referring Provider:  Edna Toribio LABOR, MD  Encounter Date: 09/28/2023   RED SENIOR, PT 09/28/2023, 8:50 AM  [place] [facility] at [facility] [address]  [address] [place], [place], [address] Phone: [phone]   Fax:  [phone]

## 2023-10-02 ENCOUNTER — Ambulatory Visit

## 2023-10-02 DIAGNOSIS — R2689 Other abnormalities of gait and mobility: Secondary | ICD-10-CM

## 2023-10-02 DIAGNOSIS — M25561 Pain in right knee: Secondary | ICD-10-CM | POA: Diagnosis not present

## 2023-10-02 DIAGNOSIS — M6281 Muscle weakness (generalized): Secondary | ICD-10-CM

## 2023-10-02 DIAGNOSIS — R600 Localized edema: Secondary | ICD-10-CM | POA: Diagnosis not present

## 2023-10-02 DIAGNOSIS — M25661 Stiffness of right knee, not elsewhere classified: Secondary | ICD-10-CM

## 2023-10-02 DIAGNOSIS — R262 Difficulty in walking, not elsewhere classified: Secondary | ICD-10-CM

## 2023-10-02 NOTE — Therapy (Signed)
 OUTPATIENT PHYSICAL THERAPY LOWER EXTREMITY TREATMENT    Patient Name: Megan Crane MRN: [id number] DOB:[birth year], 73 y.o., female Today's Date: 10/02/2023  END OF SESSION:  PT End of Session - 10/02/23 1132     Visit Number 11    Date for Recertification  10/04/23    Progress Note Due on Visit 10    PT Start Time 1128   pt late   PT Stop Time 1147    PT Time Calculation (min) 19 min    Activity Tolerance Patient tolerated treatment well    Behavior During Therapy [facility] for tasks assessed/performed                  Past Medical History:  Diagnosis Date   Anxiety disorder    hx vasovagal responses   Arthritis    Cancer (HCC)    Deep vein thrombosis (DVT) (HCC)    hx of in left left after covid in 2021   Headache(784.0)    History of kidney stones    Hydronephrosis, right    Hyperlipidemia    Hypertension    Nephrolithiasis    Pneumonia    Positive nasal culture for methicillin resistant Staphylococcus aureus    Right ureteral stone    Thrombophlebitis of leg, left, superficial 11/17/2020   Urinary incontinence    sees Dr. McDiarmid    Vasovagal syncope 08/02/2022   Past Surgical History:  Procedure Laterality Date   BLADDER REPAIR     COLONOSCOPY  04/26/2021   per Dr. Leigh, diffuse diverticulosis, rectosigmoid colitis with narrowed lumen, repeat in 5 yrs   CYSTOSCOPY  06/09/2011   Procedure: CYSTOSCOPY;  Surgeon: Glendia DELENA Elizabeth, MD;  Location: [facility];  Service: Urology;  Laterality: N/A;   CYSTOSCOPY W/ URETERAL STENT PLACEMENT  02/03/2014   Procedure: CYSTOSCOPY WITH  RIGHT RETROGRADE PYELOGRAM/ RIGHT URETERAL STENT PLACEMENT;  Surgeon: Norleen JINNY Seltzer, MD;  Location: [facility];  Service: Urology;;   CYSTOSCOPY WITH BIOPSY  02/03/2014   Procedure: CYSTOSCOPY WITH BIOPSY;  Surgeon: Norleen JINNY Seltzer, MD;  Location: [facility];  Service: Urology;;   PARTIAL KNEE ARTHROPLASTY Right 09/18/2022   Procedure:  UNICOMPARTMENTAL KNEE;  Surgeon: Edna Toribio DELENA, MD;  Location: [facility];  Service: Orthopedics;  Laterality: Right;   PILONIDAL CYST EXCISION  1974   RECTOCELE REPAIR  06/09/2011   Procedure: POSTERIOR REPAIR (RECTOCELE);  Surgeon: Glendia DELENA Elizabeth, MD;  Location: [facility];  Service: Urology;  Laterality: N/A;  Xenform graft 6x10   RIGHT URETEROSCOPIC STONE EXTRACTION / STENT PLACEMENT  03/13/2000   URETEROSCOPY Right 02/03/2014   Procedure: URETEROSCOPY WITH MANAGEMENT OF URETERAL STRICTURE;  Surgeon: Norleen JINNY Seltzer, MD;  Location: [facility];  Service: Urology;  Laterality: Right;   VAGINAL HYSTERECTOMY  06/09/2011   Procedure: HYSTERECTOMY VAGINAL;  Surgeon: Charlie JONETTA Aho, MD;  Location: [facility];  Service: Gynecology;  Laterality: N/A;   VAGINAL PROLAPSE REPAIR  06/09/2011   Procedure: VAGINAL VAULT SUSPENSION;  Surgeon: Glendia DELENA Elizabeth, MD;  Location: [facility];  Service: Urology;  Laterality: N/A;   VEIN LIGATION AND STRIPPING     LEFT LEG   Patient Active Problem List   Diagnosis Date Noted   S/P right unicompartmental knee replacement 09/18/2022   IC (interstitial cystitis) 09/04/2022   Pelvic pressure in female 08/22/2022   Visit for pre-operative examination 08/07/2022   Vasovagal syncope 08/02/2022   Nasal dryness 06/30/2022   Precancerous skin lesion 06/30/2022  Bilateral carpal tunnel syndrome 03/02/2022   Swelling of both hands 03/02/2022   Atypical chest pain 03/02/2022   OAB (overactive bladder) 11/23/2021   Corn of foot 11/23/2021   Atrophic vaginitis 11/23/2021   COVID-19 virus infection 11/17/2020   Raynaud's phenomenon without gangrene 11/17/2020   Primary osteoarthritis involving multiple joints 11/24/2019   GERD (gastroesophageal reflux disease) 12/26/2016   Edema 05/29/2014   Nasal colonization with methicillin-resistant Staphylococcus aureus 01/22/2014   Varicose veins of bilateral lower extremities with other complications 12/11/2012    Allergic rhinitis 11/29/2009   MOTION SICKNESS 11/29/2009   Hyperlipidemia 08/31/2008   Anxiety state 08/31/2008   Primary hypertension 08/31/2008   PILONIDAL CYST 08/31/2008   HEADACHE 08/31/2008   URINARY INCONTINENCE 08/31/2008   NEPHROLITHIASIS, HX OF 08/31/2008   POSTNASAL DRIP SYNDROME 05/28/2006    PCP: Johnny Senior, MD   REFERRING PROVIDER: Edna Sieving, MD  REFERRING DIAG: R IT  band syndrome  THERAPY DIAG:  Muscle weakness (generalized)  Difficulty in walking, not elsewhere classified  Stiffness of right knee, not elsewhere classified  Other abnormalities of gait and mobility  Localized edema  Rationale for Evaluation and Treatment: Rehabilitation  ONSET DATE: 06/29/23  SUBJECTIVE:   SUBJECTIVE STATEMENT: Pt arrives late, notes that she is doing good, pivoted on R knee the other day and it hurt but went away   Eval:Fell in hallway, dog had accident , L foot slipped out, landed on R lat knee with knee bent.  Saw orthopedist who did the partial knee replacement last year and they referred me here.  Then I had diverticulitis, then bronchitis, so I've been really weak and my immunity/ stamina has been depleted.  Noticing R ant knee and lower leg pulls, aches when I bend it a lot.  More prevalent problem currently is my stamina  PERTINENT HISTORY: Referred by orthopedist PAIN:  Are you having pain? Reports mild pain R post knee and ant lower leg, knee with specific positions, no interference with sleep or most functional activities  PRECAUTIONS: None  RED FLAGS: None   WEIGHT BEARING RESTRICTIONS: No  FALLS:  Has patient fallen in last 6 months? Yes. Number of falls 1  LIVING ENVIRONMENT: Lives with: lives with their family and lives with their spouse Lives in: House/apartment Stairs: No Has following equipment at home: Single point cane  OCCUPATION: retired  PLOF: Independent  PATIENT GOALS: return to normal with endurance, activity  tolerance  NEXT MD VISIT: unclear  OBJECTIVE:  Note: Objective measures were completed at Evaluation unless otherwise noted.  DIAGNOSTIC FINDINGS: orthopedist radiology results wnl  PATIENT SURVEYS:  PSFS: THE PATIENT SPECIFIC FUNCTIONAL SCALE  Place score of 0-10 (0 = unable to perform activity and 10 = able to perform activity at the same level as before injury or problem)  Activity Date: 08/09/23    Getting out of car and standing up 5    2.  Walking through grocery store 5    3.  Prolonged sitting  8    4.      Total Score 18      Total Score = Sum of activity scores/number of activities  Minimally Detectable Change: 3 points (for single activity); 2 points (for average score)  [facility] (nd). The Patient Specific Functional Scale . Retrieved from [email or web address]   COGNITION: Overall cognitive status: Within functional limits for tasks assessed     SENSATION: WFL  EDEMA:  Mild visible distal R thigh  MUSCLE LENGTH: Hamstrings: Right  wnl deg; Left wnl deg Debby test: Right nt deg; Left nt deg  POSTURE: anterior pelvic tilt and R knee flexion 10 degrees in standing  PALPATION: Mild tenderness over lateral trochanter R hip and R IT band  LOWER EXTREMITY ROM:  AA ROM Right eval Left eval R  09/04/23 RLE 09/28/23 LLE 09/28/23  Hip flexion       Hip extension       Hip abduction       Hip adduction       Hip internal rotation       Hip external rotation       Knee flexion 116  130 132 138  Knee extension -8   -4 0  Ankle dorsiflexion       Ankle plantarflexion       Ankle inversion       Ankle eversion        (Blank rows = not tested)  LOWER EXTREMITY MMT:  MMT Right eval Left eval RLE 09/28/23  Hip flexion     Hip extension     Hip abduction 4-  4  Hip adduction     Hip internal rotation   4  Hip external rotation 5  5  Knee flexion   4  Knee extension   4+  Ankle  dorsiflexion     Ankle plantarflexion 4-    Ankle inversion     Ankle eversion      (Blank rows = not tested)  LOWER EXTREMITY SPECIAL TESTS:  Hip special tests: Belvie (FABER) test: negative  FUNCTIONAL TESTS:  6 minute walk test: 4 min 24 sec 30 sec sit to stand 13 reps GAIT: Distance walked: 600' Assistive device utilized: None Level of assistance: Modified independence Comments: gait pattern with + trendelenburg R, increased hip sway B, tends to scissor at time with gait.                                                                                                                                TREATMENT DATE: 10/02/23 Nustep L5x66min Standing heel drop heel raises from 4' step x 15 Step ups 4' with hip extension x 15 R Lateral step up RLE 4' with hip abduction x 15 Leg press 18lb x 20 BLE     09/28/23 NuStep L5 x 8' BUE/BLE  Strength and ROM rechecked BOSU lunges x 2/10 RLE Seated hip ER blue TB x 2/10 BLE Seated knee flexion blue TB x 2/10 RLE Standing hip abd RTB x 2/10 RLE Standing hip ext RTB x 2/10 RLE Seated ham string stretch x 1'x 2 RLE SL hip abd x 2/10 RLE Supine cross over piriformis stretch x 1' x 2 RLE Seated knee extension 5# x 2/10 RLE   09/14/23:  Nustep L 5 x 8 min seat 11 Ue's and LE's Squats with counter support 2x10 BLE lunges touching 9' stool x 10; progressed to touching BOSU ball x  10 Lateral lunges BLE 2 x 10 S/L R hip abduction 2# 2x10 S/L R clamshell 4# 2x10 SLR 2# RLE 2x10  09/11/23:  Nustep L 5 x 6 min seat 11 Ue's and LE's R hamstring stretch with therapist providing overpressure R post knee jt in supine R ankle distraction with pt performing quad sets for post capsule stretch Side lying L for clam shell R 4# 10 x 2  Side lying R hip abd no resistance 10 x 2  B knee ext 10lb 3x10 B LE B knee flexion 3x10 25# B LE  09/07/23 walk test- went whole time 1200 ft no break, some labored Sit to stands x 10 Wall squats 2x10  mini squats B knee ext 10lb 3x10 BLE B knee flexion 3x10 25# BLE Heel drop heel raise 2x10 B Lateral step downs 2' 2x10 BLE  09/04/23:  Nustep level 5 x 7 min Gait in hallway, 300'  pt with insert L shoe, walked well, no trendelenberg noted, no scissoring R hamstring stretch with therapist providing overpressure R post knee jt in supine R ankle distraction with pt performing quad sets for post capsule stretch B knee ext 15lb x 2 sets 15 BLE B knee flexion 2 x 15 25#  08/30/23: Nustep level 5 x  6 min UE's and LE's Side lying L for theragun massage R hip abductors, ITB Seated R HS stretch RLE x 30 B knee ext 15lb x 15 BLE; RLE 5lb x 15 Sidelying R hip abduction x 10  Standing hip abduction 3lb 2x10 BLE Standing HS curls 3lb 2x10 BLE Standing hip ext 3lb 2x10 BLE  08/28/23: Nustep level 5 x  6 min UE's and LE's Side lying L for theragun massage R hip abductors Supine for distraction  R ankle with quad sets, 5 sec holds, x 10 reps  B knee ext 10#, con lift, eccentric lowering with R, x 10 reps x 3 B knee flex 25# CON lowering and eccentric raising with R LE Side lying for R hip clam shell with 3# cuff wt over R knee jt R hamstring stretch with therapist providing overpressure R post knee jt in supine Standing pillow case slides with L LE , R LE wbing to engage stability R lat hip musculature.   08/16/23: Manual: side lying L for cross friction massage primarily R TFL musculature, using theragun Supine for manual stretching by therapist for R hamstrings, 3 bouts Supine for AP glides R tibia, with femur supported on bolster, with tibia adduction direction, 3 bouts, to assist with improving R knee flexion motion tolerance Supine for patellar mobs R  Provided with rubber insert for L shoe as the cork is breaking down but working well.  Therapeutic exercise:  Bridging B, 15 x with sustained  abduction hips, with black t band around knees , cues to maintain tension for  abduction Hooklying hip abd with black t band around knees 15x, needed cuing for placement Nustep level 5 x 6 min to address  activity tolerance/ endurance  08/14/23 Therapuaetic activities Added blue t band around SI to replicate SI belt and support glut medius B, gait with the belt with some improvement in gait pattern , less sway R pelvis, but pt reported fatgue R hip abductors so removed Insert 1/8 L shoe for symmetrical iliac crest height B, gait with insert with definite improvement with R lateral pelvic shift in wbing.  Advised to trial for 24 to 48 hrs and self assess.  Manual: Side lying  L for cross friction massage primarily R TFL musculature, using theragun Supine for patellar mobs R , also cross fricoitn massage med and lat knee jt lines , pt with some tenderness there  Therex:  Instructed in bridging B, 15 x with sustained  abduction hips, with black t band around knees  Hooklying hip abd with black t band around knees  15 x Provided with printed hand outs from medbridge to follow on these 2 ex.  Nustep Level 4  Ue's and LE's, x 3 min 30 sec at end of session for endurance.   08/09/23  Evaluation, instructed in seated  or long sitting R ankle pumps with black theraband resistance 15 reps, 1 x day, education throughout in evaluation findings and gait, improving gait efficiency with reducing postural. Pelvic sway   PATIENT EDUCATION:  Education details: added new HEP Person educated: Patient Education method: Explanation, Demonstration, Tactile cues, Verbal cues, and Handouts Education comprehension: verbalized understanding  HOME EXERCISE PROGRAM: Access Code: [email or web address] URL: [email or web address] Date: 09/07/2023 Prepared by: Caffie Sotto  Exercises - Wall Quarter Squat  - 1 x daily - 7 x weekly - 3 sets - 10 reps - Standing Bilateral Heel Raise on Step  - 1 x daily - 7 x weekly - 3 sets - 10 reps  ASSESSMENT:  CLINICAL IMPRESSION:  Pt arrived late to  session, time very limited for this reason today. Worked on functional LE strengthening today within the time we had, pt responded well. Re-val date coming up on Thurs this week.    Eval:Patient is a 73 y.o. female who was evaluated today by physical therapy  for recovery from a recent fall with IT band strain. Her pain has improved quite a bit, did find today weakness R plantarflexors and R hip abductors, and some loss of extension Rom R knee as compared to her final PT visit at the end of 2024 as she was recovering from unilateral knee arthroplasty.  Her endurance is compromised, unable to complete 6 min walk test.  Also she has unsteady gait with marked postural sway, has additional history of falls prior to her R knee surgery.  Would benefit from physical therapy intervention to address her deficits and assist her with improving her balance and gait efficiency.    OBJECTIVE IMPAIRMENTS: decreased activity tolerance, decreased balance, decreased endurance, decreased knowledge of condition, difficulty walking, decreased ROM, decreased strength, and decreased safety awareness.   ACTIVITY LIMITATIONS: carrying, lifting, stairs, locomotion level, and caring for others  PARTICIPATION LIMITATIONS: cleaning, laundry, shopping, community activity, and yard work  PERSONAL FACTORS: Age, Behavior pattern, Fitness, Past/current experiences, Time since onset of injury/illness/exacerbation, and 1-2 comorbidities: recent partial R knee replacement are also affecting patient's functional outcome.   REHAB POTENTIAL: Good  CLINICAL DECISION MAKING: Evolving/moderate complexity  EVALUATION COMPLEXITY: Moderate   GOALS: Goals reviewed with patient? Yes  SHORT TERM GOALS: Target date: 2 weeks 08/23/23 I HEP Baseline: Goal status: MET- 08/30/23   LONG TERM GOALS: Target date: 8 weeks 10/04/23  Strength R hip abd and plantarflexion improve to 5/5 from 4-/5 for increased stability with gait pattern Baseline:   09/27/23:  Advancing--see MMT tables above Goal status: IN PROGRESS  2.  Complete 6 min walk test, walking greater than 800'  Baseline: 4 min 24 sec and 600'  Goal status: INITIAL  3.  Improve R knee extension ROM to -5 or better from -11 for more efficient gait pattern Baseline:  9/18:   see ROM table above Goal  status: MET   4.  30 sec sit to stand improve from 13 reps to 16 Baseline:  Goal status:progressing 08/28/23; 14 reps improved       PLAN:  PT FREQUENCY: 2x/week  PT DURATION: 8 weeks  PLANNED INTERVENTIONS: 97110-Therapeutic exercises, 97530- Therapeutic activity, 97112- Neuromuscular re-education, 97535- Self Care, and 02859- Manual therapy  PLAN FOR NEXT SESSION: strengthening for R LE plantarflexors and hip abductors, balance and pre gait, gait activities to focus on reducing R hip trendelenburg   Sol LITTIE Gaskins, PTA 10/02/2023, 11:47 AM [facility] at [facility] [address]  [address] [place], [place], [address] Phone: [phone]   Fax:  [phone]  Patient Details  Name: CHIMERE KLINGENSMITH MRN: [id number] Date of Birth: 1950/02/17 Referring Provider:  Edna Toribio LABOR, MD  Encounter Date: 10/02/2023   Sol LITTIE Gaskins, PTA 10/02/2023, 11:47 AM  [facility] at [facility] [address]  [address] [place], [place], [address] Phone: [phone]   Fax:  [phone]

## 2023-10-04 ENCOUNTER — Other Ambulatory Visit: Payer: Self-pay

## 2023-10-04 ENCOUNTER — Ambulatory Visit

## 2023-10-04 DIAGNOSIS — M6281 Muscle weakness (generalized): Secondary | ICD-10-CM | POA: Diagnosis not present

## 2023-10-04 DIAGNOSIS — R2689 Other abnormalities of gait and mobility: Secondary | ICD-10-CM | POA: Diagnosis not present

## 2023-10-04 DIAGNOSIS — M25661 Stiffness of right knee, not elsewhere classified: Secondary | ICD-10-CM | POA: Diagnosis not present

## 2023-10-04 DIAGNOSIS — R262 Difficulty in walking, not elsewhere classified: Secondary | ICD-10-CM | POA: Diagnosis not present

## 2023-10-04 DIAGNOSIS — M25561 Pain in right knee: Secondary | ICD-10-CM | POA: Diagnosis not present

## 2023-10-04 DIAGNOSIS — R600 Localized edema: Secondary | ICD-10-CM | POA: Diagnosis not present

## 2023-10-04 NOTE — Therapy (Signed)
 OUTPATIENT PHYSICAL THERAPY LOWER EXTREMITY TREATMENT /RECERTIFICATION   Patient Name: Megan Crane MRN: [id number] DOB:1950-10-28, 73 y.o., female Today's Date: 10/04/2023  END OF SESSION:  PT End of Session - 10/04/23 1032     Visit Number 12    Date for Recertification  11/15/23    Progress Note Due on Visit 20    PT Start Time 1029    PT Stop Time 1100    PT Time Calculation (min) 31 min    Activity Tolerance Patient tolerated treatment well    Behavior During Therapy [facility] for tasks assessed/performed                   Past Medical History:  Diagnosis Date   Anxiety disorder    hx vasovagal responses   Arthritis    Cancer (HCC)    Deep vein thrombosis (DVT) (HCC)    hx of in left left after covid in 2021   Headache(784.0)    History of kidney stones    Hydronephrosis, right    Hyperlipidemia    Hypertension    Nephrolithiasis    Pneumonia    Positive nasal culture for methicillin resistant Staphylococcus aureus    Right ureteral stone    Thrombophlebitis of leg, left, superficial 11/17/2020   Urinary incontinence    sees Dr. McDiarmid    Vasovagal syncope 08/02/2022   Past Surgical History:  Procedure Laterality Date   BLADDER REPAIR     COLONOSCOPY  04/26/2021   per Dr. Leigh, diffuse diverticulosis, rectosigmoid colitis with narrowed lumen, repeat in 5 yrs   CYSTOSCOPY  06/09/2011   Procedure: CYSTOSCOPY;  Surgeon: Glendia DELENA Elizabeth, MD;  Location: [facility];  Service: Urology;  Laterality: N/A;   CYSTOSCOPY W/ URETERAL STENT PLACEMENT  02/03/2014   Procedure: CYSTOSCOPY WITH  RIGHT RETROGRADE PYELOGRAM/ RIGHT URETERAL STENT PLACEMENT;  Surgeon: Norleen JINNY Seltzer, MD;  Location: [facility];  Service: Urology;;   CYSTOSCOPY WITH BIOPSY  02/03/2014   Procedure: CYSTOSCOPY WITH BIOPSY;  Surgeon: Norleen JINNY Seltzer, MD;  Location: [facility];  Service: Urology;;   PARTIAL KNEE ARTHROPLASTY Right 09/18/2022   Procedure:  UNICOMPARTMENTAL KNEE;  Surgeon: Edna Toribio DELENA, MD;  Location: [facility];  Service: Orthopedics;  Laterality: Right;   PILONIDAL CYST EXCISION  1974   RECTOCELE REPAIR  06/09/2011   Procedure: POSTERIOR REPAIR (RECTOCELE);  Surgeon: Glendia DELENA Elizabeth, MD;  Location: [facility];  Service: Urology;  Laterality: N/A;  Xenform graft 6x10   RIGHT URETEROSCOPIC STONE EXTRACTION / STENT PLACEMENT  03/13/2000   URETEROSCOPY Right 02/03/2014   Procedure: URETEROSCOPY WITH MANAGEMENT OF URETERAL STRICTURE;  Surgeon: Norleen JINNY Seltzer, MD;  Location: [facility];  Service: Urology;  Laterality: Right;   VAGINAL HYSTERECTOMY  06/09/2011   Procedure: HYSTERECTOMY VAGINAL;  Surgeon: Charlie JONETTA Aho, MD;  Location: [facility];  Service: Gynecology;  Laterality: N/A;   VAGINAL PROLAPSE REPAIR  06/09/2011   Procedure: VAGINAL VAULT SUSPENSION;  Surgeon: Glendia DELENA Elizabeth, MD;  Location: [facility];  Service: Urology;  Laterality: N/A;   VEIN LIGATION AND STRIPPING     LEFT LEG   Patient Active Problem List   Diagnosis Date Noted   S/P right unicompartmental knee replacement 09/18/2022   IC (interstitial cystitis) 09/04/2022   Pelvic pressure in female 08/22/2022   Visit for pre-operative examination 08/07/2022   Vasovagal syncope 08/02/2022   Nasal dryness 06/30/2022   Precancerous skin lesion 06/30/2022   Bilateral carpal  tunnel syndrome 03/02/2022   Swelling of both hands 03/02/2022   Atypical chest pain 03/02/2022   OAB (overactive bladder) 11/23/2021   Corn of foot 11/23/2021   Atrophic vaginitis 11/23/2021   COVID-19 virus infection 11/17/2020   Raynaud's phenomenon without gangrene 11/17/2020   Primary osteoarthritis involving multiple joints 11/24/2019   GERD (gastroesophageal reflux disease) 12/26/2016   Edema 05/29/2014   Nasal colonization with methicillin-resistant Staphylococcus aureus 01/22/2014   Varicose veins of bilateral lower extremities with other complications 12/11/2012    Allergic rhinitis 11/29/2009   MOTION SICKNESS 11/29/2009   Hyperlipidemia 08/31/2008   Anxiety state 08/31/2008   Primary hypertension 08/31/2008   PILONIDAL CYST 08/31/2008   HEADACHE 08/31/2008   URINARY INCONTINENCE 08/31/2008   NEPHROLITHIASIS, HX OF 08/31/2008   POSTNASAL DRIP SYNDROME 05/28/2006    PCP: Johnny Senior, MD   REFERRING PROVIDER: Edna Sieving, MD  REFERRING DIAG: R IT  band syndrome  THERAPY DIAG:  Difficulty in walking, not elsewhere classified  Muscle weakness (generalized)  Stiffness of right knee, not elsewhere classified  Other abnormalities of gait and mobility  Rationale for Evaluation and Treatment: Rehabilitation  ONSET DATE: 06/29/23  SUBJECTIVE:   SUBJECTIVE STATEMENT: Pt arrives late, has been cleaning some and experiencing R ant knee soreness. Otherwise doing well, better stamina, still utilizing lift in L shoe which makes a large difference in her endurance and control of hips    Eval:Fell in hallway, dog had accident , L foot slipped out, landed on R lat knee with knee bent.  Saw orthopedist who did the partial knee replacement last year and they referred me here.  Then I had diverticulitis, then bronchitis, so I've been really weak and my immunity/ stamina has been depleted.  Noticing R ant knee and lower leg pulls, aches when I bend it a lot.  More prevalent problem currently is my stamina  PERTINENT HISTORY: Referred by orthopedist PAIN:  Are you having pain? Reports mild pain R post knee and ant lower leg, knee with specific positions, no interference with sleep or most functional activities  PRECAUTIONS: None  RED FLAGS: None   WEIGHT BEARING RESTRICTIONS: No  FALLS:  Has patient fallen in last 6 months? Yes. Number of falls 1  LIVING ENVIRONMENT: Lives with: lives with their family and lives with their spouse Lives in: House/apartment Stairs: No Has following equipment at home: Single point cane  OCCUPATION:  retired  PLOF: Independent  PATIENT GOALS: return to normal with endurance, activity tolerance  NEXT MD VISIT: unclear  OBJECTIVE:  Note: Objective measures were completed at Evaluation unless otherwise noted.  DIAGNOSTIC FINDINGS: orthopedist radiology results wnl  PATIENT SURVEYS:  PSFS: THE PATIENT SPECIFIC FUNCTIONAL SCALE  Place score of 0-10 (0 = unable to perform activity and 10 = able to perform activity at the same level as before injury or problem)  Activity Date: 08/09/23    Getting out of car and standing up 5    2.  Walking through grocery store 5    3.  Prolonged sitting  8    4.      Total Score 18      Total Score = Sum of activity scores/number of activities  Minimally Detectable Change: 3 points (for single activity); 2 points (for average score)  [employer] [facility] (nd). The Patient Specific Functional Scale . Retrieved from [email or web address]   COGNITION: Overall cognitive status: Within functional limits for tasks assessed     SENSATION: WFL  EDEMA:  Mild visible distal R thigh  MUSCLE LENGTH: Hamstrings: Right wnl deg; Left wnl deg Debby test: Right nt deg; Left nt deg  POSTURE: anterior pelvic tilt and R knee flexion 10 degrees in standing  PALPATION: Mild tenderness over lateral trochanter R hip and R IT band  LOWER EXTREMITY ROM:  AA ROM Right eval Left eval R  09/04/23 RLE 09/28/23 LLE 09/28/23  Hip flexion       Hip extension       Hip abduction       Hip adduction       Hip internal rotation       Hip external rotation       Knee flexion 116  130 132 138  Knee extension -8   -4 0  Ankle dorsiflexion       Ankle plantarflexion       Ankle inversion       Ankle eversion        (Blank rows = not tested)  LOWER EXTREMITY MMT:  MMT Right eval Left eval RLE 09/28/23   Hip flexion      Hip extension      Hip abduction 4-  4   Hip adduction      Hip internal  rotation   4   Hip external rotation 5  5   Knee flexion   4   Knee extension   4+   Ankle dorsiflexion      Ankle plantarflexion 4-     Ankle inversion      Ankle eversion       (Blank rows = not tested)  LOWER EXTREMITY SPECIAL TESTS:  Hip special tests: Belvie (FABER) test: negative  FUNCTIONAL TESTS:  6 minute walk test: 4 min 24 sec 30 sec sit to stand 13 reps GAIT: Distance walked: 600' Assistive device utilized: None Level of assistance: Modified independence Comments: gait pattern with + trendelenburg R, increased hip sway B, tends to scissor at time with gait.                                                                                                                                TREATMENT DATE: 10/04/23:  Nustep L  5 x 5 min Supine for R hamstring stretch therapist providing overpressure R post knee to isolate post capsule Supine R lower leg distraction c 5 with 10 sec quad sets, to isolate stretch R post capsule  Hamstring curls B 20# 15 x  B knee ext 10# 2 x 15 6 min walk test , 5 laps  10/02/23 Nustep L5x12min Standing heel drop heel raises from 4' step x 15 Step ups 4' with hip extension x 15 R Lateral step up RLE 4' with hip abduction x 15 Leg press 18lb x 20 BLE     09/28/23 NuStep L5 x 8' BUE/BLE  Strength and ROM rechecked BOSU lunges x 2/10 RLE Seated hip ER blue TB x  2/10 BLE Seated knee flexion blue TB x 2/10 RLE Standing hip abd RTB x 2/10 RLE Standing hip ext RTB x 2/10 RLE Seated ham string stretch x 1'x 2 RLE SL hip abd x 2/10 RLE Supine cross over piriformis stretch x 1' x 2 RLE Seated knee extension 5# x 2/10 RLE   09/14/23:  Nustep L 5 x 8 min seat 11 Ue's and LE's Squats with counter support 2x10 BLE lunges touching 9' stool x 10; progressed to touching BOSU ball x 10 Lateral lunges BLE 2 x 10 S/L R hip abduction 2# 2x10 S/L R clamshell 4# 2x10 SLR 2# RLE 2x10  09/11/23:  Nustep L 5 x 6 min seat 11 Ue's and LE's R  hamstring stretch with therapist providing overpressure R post knee jt in supine R ankle distraction with pt performing quad sets for post capsule stretch Side lying L for clam shell R 4# 10 x 2  Side lying R hip abd no resistance 10 x 2  B knee ext 10lb 3x10 B LE B knee flexion 3x10 25# B LE  09/07/23 walk test- went whole time 1200 ft no break, some labored Sit to stands x 10 Wall squats 2x10 mini squats B knee ext 10lb 3x10 BLE B knee flexion 3x10 25# BLE Heel drop heel raise 2x10 B Lateral step downs 2' 2x10 BLE  09/04/23:  Nustep level 5 x 7 min Gait in hallway, 300'  pt with insert L shoe, walked well, no trendelenberg noted, no scissoring R hamstring stretch with therapist providing overpressure R post knee jt in supine R ankle distraction with pt performing quad sets for post capsule stretch B knee ext 15lb x 2 sets 15 BLE B knee flexion 2 x 15 25#  08/30/23: Nustep level 5 x  6 min UE's and LE's Side lying L for theragun massage R hip abductors, ITB Seated R HS stretch RLE x 30 B knee ext 15lb x 15 BLE; RLE 5lb x 15 Sidelying R hip abduction x 10  Standing hip abduction 3lb 2x10 BLE Standing HS curls 3lb 2x10 BLE Standing hip ext 3lb 2x10 BLE  08/28/23: Nustep level 5 x  6 min UE's and LE's Side lying L for theragun massage R hip abductors Supine for distraction  R ankle with quad sets, 5 sec holds, x 10 reps  B knee ext 10#, con lift, eccentric lowering with R, x 10 reps x 3 B knee flex 25# CON lowering and eccentric raising with R LE Side lying for R hip clam shell with 3# cuff wt over R knee jt R hamstring stretch with therapist providing overpressure R post knee jt in supine Standing pillow case slides with L LE , R LE wbing to engage stability R lat hip musculature.   08/16/23: Manual: side lying L for cross friction massage primarily R TFL musculature, using theragun Supine for manual stretching by therapist for R hamstrings, 3 bouts Supine for AP  glides R tibia, with femur supported on bolster, with tibia adduction direction, 3 bouts, to assist with improving R knee flexion motion tolerance Supine for patellar mobs R  Provided with rubber insert for L shoe as the cork is breaking down but working well.  Therapeutic exercise:  Bridging B, 15 x with sustained  abduction hips, with black t band around knees , cues to maintain tension for abduction Hooklying hip abd with black t band around knees 15x, needed cuing for placement Nustep level 5 x 6 min  to address  activity tolerance/ endurance  08/14/23 Therapuaetic activities Added blue t band around SI to replicate SI belt and support glut medius B, gait with the belt with some improvement in gait pattern , less sway R pelvis, but pt reported fatgue R hip abductors so removed Insert 1/8 L shoe for symmetrical iliac crest height B, gait with insert with definite improvement with R lateral pelvic shift in wbing.  Advised to trial for 24 to 48 hrs and self assess.  Manual: Side lying L for cross friction massage primarily R TFL musculature, using theragun Supine for patellar mobs R , also cross fricoitn massage med and lat knee jt lines , pt with some tenderness there  Therex:  Instructed in bridging B, 15 x with sustained  abduction hips, with black t band around knees  Hooklying hip abd with black t band around knees  15 x Provided with printed hand outs from [employer]bridge to follow on these 2 ex.  Nustep Level 4  Ue's and LE's, x 3 min 30 sec at end of session for endurance.   08/09/23  Evaluation, instructed in seated  or long sitting R ankle pumps with black theraband resistance 15 reps, 1 x day, education throughout in evaluation findings and gait, improving gait efficiency with reducing postural. Pelvic sway   PATIENT EDUCATION:  Education details: added new HEP Person educated: Patient Education method: Explanation, Demonstration, Tactile cues, Verbal cues, and Handouts Education  comprehension: verbalized understanding  HOME EXERCISE PROGRAM: Access Code: [email or web address] URL: [email or web address] Date: 09/07/2023 Prepared by: Braylin Clark  Exercises - Wall Quarter Squat  - 1 x daily - 7 x weekly - 3 sets - 10 reps - Standing Bilateral Heel Raise on Step  - 1 x daily - 7 x weekly - 3 sets - 10 reps  ASSESSMENT:  CLINICAL IMPRESSION:  Pt arrived 15 min late to session, time limited for this reason today. She wants to attend at least through next week due to some missed appts and lingering Sx R ant knee. So extended for 6 weeks with intent most likely to DC at end of next week.  She has met most of her goals, just painful R ant knee with certain movements/ activities.  Will utilize next weeks sessions to compile and review her home routine.    Eval:Patient is a 73 y.o. female who was evaluated today by physical therapy  for recovery from a recent fall with IT band strain. Her pain has improved quite a bit, did find today weakness R plantarflexors and R hip abductors, and some loss of extension Rom R knee as compared to her final PT visit at the end of 2024 as she was recovering from unilateral knee arthroplasty.  Her endurance is compromised, unable to complete 6 min walk test.  Also she has unsteady gait with marked postural sway, has additional history of falls prior to her R knee surgery.  Would benefit from physical therapy intervention to address her deficits and assist her with improving her balance and gait efficiency.    OBJECTIVE IMPAIRMENTS: decreased activity tolerance, decreased balance, decreased endurance, decreased knowledge of condition, difficulty walking, decreased ROM, decreased strength, and decreased safety awareness.   ACTIVITY LIMITATIONS: carrying, lifting, stairs, locomotion level, and caring for others  PARTICIPATION LIMITATIONS: cleaning, laundry, shopping, community activity, and yard work  PERSONAL FACTORS: Age, Behavior pattern,  Fitness, Past/current experiences, Time since onset of injury/illness/exacerbation, and 1-2 comorbidities: recent partial R knee replacement are also affecting patient's functional  outcome.   REHAB POTENTIAL: Good  CLINICAL DECISION MAKING: Evolving/moderate complexity  EVALUATION COMPLEXITY: Moderate   GOALS: Goals reviewed with patient? Yes  SHORT TERM GOALS: Target date: 2 weeks 08/23/23 I HEP Baseline: Goal status: MET- 08/30/23   LONG TERM GOALS: Target date: 8 weeks 10/04/23, EXTENDED TO 11/15/23:  Strength R hip abd and plantarflexion improve to 5/5 from 4-/5 for increased stability with gait pattern Baseline:  09/27/23:  Advancing--see MMT tables above Goal status: IN PROGRESS 10/04/23: ME tof hip abd, quads, plantarflexion needs to improve   2.  Complete 6 min walk test, walking greater than 800'  Baseline: 4 min 24 sec and 600'  Goal status: MET Walked 1200' in 6 min today  3.  Improve R knee extension ROM to -5 or better from -11 for more efficient gait pattern Baseline:  9/18:   see ROM table above Goal status: MET   4.  30 sec sit to stand improve from 13 reps to 16 Baseline:  Goal status:progressing 08/28/23; 14 reps improved  10/04/23: not met , 14 reps      PLAN:  PT FREQUENCY: 2x/week  PT DURATION: 8 weeks  PLANNED INTERVENTIONS: 97110-Therapeutic exercises, 97530- Therapeutic activity, 97112- Neuromuscular re-education, 97535- Self Care, and 02859- Manual therapy  PLAN FOR NEXT SESSION: strengthening for R LE plantarflexors and hip abductors, balance and pre gait, gait activities to focus on reducing R hip trendelenburg   Chaney Maclaren L Crews Mccollam, PT 10/04/2023, 4:40 PM [facility] Out[facility] Rehabilitation at [facility] [address]  [address] [place], [place], [address] Phone: [phone]   Fax:  [phone]  Patient Details  Name: Megan Crane MRN: [id number] Date of Birth: 08/09/1950 Referring Provider:  Edna Toribio LABOR, MD  Encounter Date: 10/04/2023   Greig LITTIE Credit, PT 10/04/2023, 4:40 PM   [facility] Outpatient Rehabilitation at [facility] [address]  [address] [place], [place], [address] Phone: [phone]   Fax:  [phone]

## 2023-10-09 ENCOUNTER — Ambulatory Visit

## 2023-10-09 DIAGNOSIS — M25661 Stiffness of right knee, not elsewhere classified: Secondary | ICD-10-CM

## 2023-10-09 DIAGNOSIS — R2689 Other abnormalities of gait and mobility: Secondary | ICD-10-CM | POA: Diagnosis not present

## 2023-10-09 DIAGNOSIS — M6281 Muscle weakness (generalized): Secondary | ICD-10-CM | POA: Diagnosis not present

## 2023-10-09 DIAGNOSIS — R262 Difficulty in walking, not elsewhere classified: Secondary | ICD-10-CM

## 2023-10-09 DIAGNOSIS — R600 Localized edema: Secondary | ICD-10-CM | POA: Diagnosis not present

## 2023-10-09 DIAGNOSIS — M25561 Pain in right knee: Secondary | ICD-10-CM | POA: Diagnosis not present

## 2023-10-09 NOTE — Therapy (Signed)
 OUTPATIENT PHYSICAL THERAPY LOWER EXTREMITY TREATMENT    Patient Name: Megan Crane MRN: [id number] DOB:1950-05-03, 73 y.o., female Today's Date: 10/09/2023  END OF SESSION:  PT End of Session - 10/09/23 1324     Visit Number 13    Date for Recertification  11/15/23    Progress Note Due on Visit 20    PT Start Time 1320    PT Stop Time 1401    PT Time Calculation (min) 41 min    Activity Tolerance Patient tolerated treatment well    Behavior During Therapy [facility] for tasks assessed/performed                    Past Medical History:  Diagnosis Date   Anxiety disorder    hx vasovagal responses   Arthritis    Cancer (HCC)    Deep vein thrombosis (DVT) (HCC)    hx of in left left after covid in 2021   Headache(784.0)    History of kidney stones    Hydronephrosis, right    Hyperlipidemia    Hypertension    Nephrolithiasis    Pneumonia    Positive nasal culture for methicillin resistant Staphylococcus aureus    Right ureteral stone    Thrombophlebitis of leg, left, superficial 11/17/2020   Urinary incontinence    sees Dr. McDiarmid    Vasovagal syncope 08/02/2022   Past Surgical History:  Procedure Laterality Date   BLADDER REPAIR     COLONOSCOPY  04/26/2021   per Dr. Leigh, diffuse diverticulosis, rectosigmoid colitis with narrowed lumen, repeat in 5 yrs   CYSTOSCOPY  06/09/2011   Procedure: CYSTOSCOPY;  Surgeon: Glendia Crane Elizabeth, MD;  Location: [facility];  Service: Urology;  Laterality: N/A;   CYSTOSCOPY W/ URETERAL STENT PLACEMENT  02/03/2014   Procedure: CYSTOSCOPY WITH  RIGHT RETROGRADE PYELOGRAM/ RIGHT URETERAL STENT PLACEMENT;  Surgeon: Megan JINNY Seltzer, MD;  Location: [facility];  Service: Urology;;   CYSTOSCOPY WITH BIOPSY  02/03/2014   Procedure: CYSTOSCOPY WITH BIOPSY;  Surgeon: Megan JINNY Seltzer, MD;  Location: [facility];  Service: Urology;;   PARTIAL KNEE ARTHROPLASTY Right 09/18/2022   Procedure: UNICOMPARTMENTAL  KNEE;  Surgeon: Megan Toribio DELENA, MD;  Location: [facility];  Service: Orthopedics;  Laterality: Right;   PILONIDAL CYST EXCISION  1974   RECTOCELE REPAIR  06/09/2011   Procedure: POSTERIOR REPAIR (RECTOCELE);  Surgeon: Glendia Crane Elizabeth, MD;  Location: [facility];  Service: Urology;  Laterality: N/A;  Xenform graft 6x10   RIGHT URETEROSCOPIC STONE EXTRACTION / STENT PLACEMENT  03/13/2000   URETEROSCOPY Right 02/03/2014   Procedure: URETEROSCOPY WITH MANAGEMENT OF URETERAL STRICTURE;  Surgeon: Megan JINNY Seltzer, MD;  Location: [facility];  Service: Urology;  Laterality: Right;   VAGINAL HYSTERECTOMY  06/09/2011   Procedure: HYSTERECTOMY VAGINAL;  Surgeon: Megan JONETTA Aho, MD;  Location: [facility];  Service: Gynecology;  Laterality: N/A;   VAGINAL PROLAPSE REPAIR  06/09/2011   Procedure: VAGINAL VAULT SUSPENSION;  Surgeon: Glendia Crane Elizabeth, MD;  Location: [facility];  Service: Urology;  Laterality: N/A;   VEIN LIGATION AND STRIPPING     LEFT LEG   Patient Active Problem List   Diagnosis Date Noted   S/P right unicompartmental knee replacement 09/18/2022   IC (interstitial cystitis) 09/04/2022   Pelvic pressure in female 08/22/2022   Visit for pre-operative examination 08/07/2022   Vasovagal syncope 08/02/2022   Nasal dryness 06/30/2022   Precancerous skin lesion 06/30/2022   Bilateral  carpal tunnel syndrome 03/02/2022   Swelling of both hands 03/02/2022   Atypical chest pain 03/02/2022   OAB (overactive bladder) 11/23/2021   Corn of foot 11/23/2021   Atrophic vaginitis 11/23/2021   COVID-19 virus infection 11/17/2020   Raynaud's phenomenon without gangrene 11/17/2020   Primary osteoarthritis involving multiple joints 11/24/2019   GERD (gastroesophageal reflux disease) 12/26/2016   Edema 05/29/2014   Nasal colonization with methicillin-resistant Staphylococcus aureus 01/22/2014   Varicose veins of bilateral lower extremities with other complications 12/11/2012   Allergic  rhinitis 11/29/2009   MOTION SICKNESS 11/29/2009   Hyperlipidemia 08/31/2008   Anxiety state 08/31/2008   Primary hypertension 08/31/2008   PILONIDAL CYST 08/31/2008   HEADACHE 08/31/2008   URINARY INCONTINENCE 08/31/2008   NEPHROLITHIASIS, HX OF 08/31/2008   POSTNASAL DRIP SYNDROME 05/28/2006    PCP: Megan Senior, MD   REFERRING PROVIDER: Edna Sieving, MD  REFERRING DIAG: R IT  band syndrome  THERAPY DIAG:  Difficulty in walking, not elsewhere classified  Muscle weakness (generalized)  Stiffness of right knee, not elsewhere classified  Other abnormalities of gait and mobility  Localized edema  Rationale for Evaluation and Treatment: Rehabilitation  ONSET DATE: 06/29/23  SUBJECTIVE:   SUBJECTIVE STATEMENT: Pt arrives late, has been cleaning some and experiencing R ant knee soreness. Otherwise doing well, better stamina, still utilizing lift in L shoe which makes a large difference in her endurance and control of hips    Eval:Fell in hallway, dog had accident , L foot slipped out, landed on R lat knee with knee bent.  Saw orthopedist who did the partial knee replacement last year and they referred me here.  Then I had diverticulitis, then bronchitis, so I've been really weak and my immunity/ stamina has been depleted.  Noticing R ant knee and lower leg pulls, aches when I bend it a lot.  More prevalent problem currently is my stamina  PERTINENT HISTORY: Referred by orthopedist PAIN:  Are you having pain? Reports mild pain R post knee and ant lower leg, knee with specific positions, no interference with sleep or most functional activities  PRECAUTIONS: None  RED FLAGS: None   WEIGHT BEARING RESTRICTIONS: No  FALLS:  Has patient fallen in last 6 months? Yes. Number of falls 1  LIVING ENVIRONMENT: Lives with: lives with their family and lives with their spouse Lives in: House/apartment Stairs: No Has following equipment at home: Single point  cane  OCCUPATION: retired  PLOF: Independent  PATIENT GOALS: return to normal with endurance, activity tolerance  NEXT MD VISIT: unclear  OBJECTIVE:  Note: Objective measures were completed at Evaluation unless otherwise noted.  DIAGNOSTIC FINDINGS: orthopedist radiology results wnl  PATIENT SURVEYS:  PSFS: THE PATIENT SPECIFIC FUNCTIONAL SCALE  Place score of 0-10 (0 = unable to perform activity and 10 = able to perform activity at the same level as before injury or problem)  Activity Date: 08/09/23    Getting out of car and standing up 5    2.  Walking through grocery store 5    3.  Prolonged sitting  8    4.      Total Score 18      Total Score = Sum of activity scores/number of activities  Minimally Detectable Change: 3 points (for single activity); 2 points (for average score)  [facility] (nd). The Patient Specific Functional Scale . Retrieved from [email or web address]   COGNITION: Overall cognitive status: Within functional limits for tasks assessed     SENSATION:  WFL  EDEMA:  Mild visible distal R thigh  MUSCLE LENGTH: Hamstrings: Right wnl deg; Left wnl deg Debby test: Right nt deg; Left nt deg  POSTURE: anterior pelvic tilt and R knee flexion 10 degrees in standing  PALPATION: Mild tenderness over lateral trochanter R hip and R IT band  LOWER EXTREMITY ROM:  AA ROM Right eval Left eval R  09/04/23 RLE 09/28/23 LLE 09/28/23  Hip flexion       Hip extension       Hip abduction       Hip adduction       Hip internal rotation       Hip external rotation       Knee flexion 116  130 132 138  Knee extension -8   -4 0  Ankle dorsiflexion       Ankle plantarflexion       Ankle inversion       Ankle eversion        (Blank rows = not tested)  LOWER EXTREMITY MMT:  MMT Right eval Left eval RLE 09/28/23   Hip flexion      Hip extension      Hip abduction 4-  4   Hip adduction       Hip internal rotation   4   Hip external rotation 5  5   Knee flexion   4   Knee extension   4+   Ankle dorsiflexion      Ankle plantarflexion 4-     Ankle inversion      Ankle eversion       (Blank rows = not tested)  LOWER EXTREMITY SPECIAL TESTS:  Hip special tests: Belvie (FABER) test: negative  FUNCTIONAL TESTS:  6 minute walk test: 4 min 24 sec 30 sec sit to stand 13 reps GAIT: Distance walked: 600' Assistive device utilized: None Level of assistance: Modified independence Comments: gait pattern with + trendelenburg R, increased hip sway B, tends to scissor at time with gait.                                                                                                                                TREATMENT DATE: 10/09/23 Nustep L5x7min Lunges at counter x 15 B Lateral lunges x 15 B Squats x 15 with TRX Standing hip ext with TRX x 15 B Reviewed lat lunge and fwd lunge with TRX Knee ext R/L 5lb x 15 each Bridge orange pball x 20  10/04/23:  Nustep L  5 x 5 min Supine for R hamstring stretch therapist providing overpressure R post knee to isolate post capsule Supine R lower leg distraction c 5 with 10 sec quad sets, to isolate stretch R post capsule  Hamstring curls B 20# 15 x  B knee ext 10# 2 x 15 6 min walk test , 5 laps   10/02/23 Nustep L5x2min Standing heel drop heel raises from 4' step  x 15 Step ups 4' with hip extension x 15 R Lateral step up RLE 4' with hip abduction x 15 Leg press 18lb x 20 BLE     09/28/23 NuStep L5 x 8' BUE/BLE  Strength and ROM rechecked BOSU lunges x 2/10 RLE Seated hip ER blue TB x 2/10 BLE Seated knee flexion blue TB x 2/10 RLE Standing hip abd RTB x 2/10 RLE Standing hip ext RTB x 2/10 RLE Seated ham string stretch x 1'x 2 RLE SL hip abd x 2/10 RLE Supine cross over piriformis stretch x 1' x 2 RLE Seated knee extension 5# x 2/10 RLE   09/14/23:  Nustep L 5 x 8 min seat 11 Ue's and LE's Squats with counter  support 2x10 BLE lunges touching 9' stool x 10; progressed to touching BOSU ball x 10 Lateral lunges BLE 2 x 10 S/L R hip abduction 2# 2x10 S/L R clamshell 4# 2x10 SLR 2# RLE 2x10  09/11/23:  Nustep L 5 x 6 min seat 11 Ue's and LE's R hamstring stretch with therapist providing overpressure R post knee jt in supine R ankle distraction with pt performing quad sets for post capsule stretch Side lying L for clam shell R 4# 10 x 2  Side lying R hip abd no resistance 10 x 2  B knee ext 10lb 3x10 B LE B knee flexion 3x10 25# B LE  09/07/23 walk test- went whole time 1200 ft no break, some labored Sit to stands x 10 Wall squats 2x10 mini squats B knee ext 10lb 3x10 BLE B knee flexion 3x10 25# BLE Heel drop heel raise 2x10 B Lateral step downs 2' 2x10 BLE  09/04/23:  Nustep level 5 x 7 min Gait in hallway, 300'  pt with insert L shoe, walked well, no trendelenberg noted, no scissoring R hamstring stretch with therapist providing overpressure R post knee jt in supine R ankle distraction with pt performing quad sets for post capsule stretch B knee ext 15lb x 2 sets 15 BLE B knee flexion 2 x 15 25#  08/30/23: Nustep level 5 x  6 min UE's and LE's Side lying L for theragun massage R hip abductors, ITB Seated R HS stretch RLE x 30 B knee ext 15lb x 15 BLE; RLE 5lb x 15 Sidelying R hip abduction x 10  Standing hip abduction 3lb 2x10 BLE Standing HS curls 3lb 2x10 BLE Standing hip ext 3lb 2x10 BLE  08/28/23: Nustep level 5 x  6 min UE's and LE's Side lying L for theragun massage R hip abductors Supine for distraction  R ankle with quad sets, 5 sec holds, x 10 reps  B knee ext 10#, con lift, eccentric lowering with R, x 10 reps x 3 B knee flex 25# CON lowering and eccentric raising with R LE Side lying for R hip clam shell with 3# cuff wt over R knee jt R hamstring stretch with therapist providing overpressure R post knee jt in supine Standing pillow case slides with L LE , R LE  wbing to engage stability R lat hip musculature.   08/16/23: Manual: side lying L for cross friction massage primarily R TFL musculature, using theragun Supine for manual stretching by therapist for R hamstrings, 3 bouts Supine for AP glides R tibia, with femur supported on bolster, with tibia adduction direction, 3 bouts, to assist with improving R knee flexion motion tolerance Supine for patellar mobs R  Provided with rubber insert for L shoe as the  cork is breaking down but working well.  Therapeutic exercise:  Bridging B, 15 x with sustained  abduction hips, with black t band around knees , cues to maintain tension for abduction Hooklying hip abd with black t band around knees 15x, needed cuing for placement Nustep level 5 x 6 min to address  activity tolerance/ endurance  08/14/23 Therapuaetic activities Added blue t band around SI to replicate SI belt and support glut medius B, gait with the belt with some improvement in gait pattern , less sway R pelvis, but pt reported fatgue R hip abductors so removed Insert 1/8 L shoe for symmetrical iliac crest height B, gait with insert with definite improvement with R lateral pelvic shift in wbing.  Advised to trial for 24 to 48 hrs and self assess.  Manual: Side lying L for cross friction massage primarily R TFL musculature, using theragun Supine for patellar mobs R , also cross fricoitn massage med and lat knee jt lines , pt with some tenderness there  Therex:  Instructed in bridging B, 15 x with sustained  abduction hips, with black t band around knees  Hooklying hip abd with black t band around knees  15 x Provided with printed hand outs from medbridge to follow on these 2 ex.  Nustep Level 4  Ue's and LE's, x 3 min 30 sec at end of session for endurance.   08/09/23  Evaluation, instructed in seated  or long sitting R ankle pumps with black theraband resistance 15 reps, 1 x day, education throughout in evaluation findings and gait,  improving gait efficiency with reducing postural. Pelvic sway   PATIENT EDUCATION:  Education details: added new HEP Person educated: Patient Education method: Explanation, Demonstration, Tactile cues, Verbal cues, and Handouts Education comprehension: verbalized understanding  HOME EXERCISE PROGRAM: Access Code: [email or web address] URL: [email or web address] Date: 10/09/2023 Prepared by: Mika Griffitts  Exercises - Wall Quarter Squat  - 1 x daily - 7 x weekly - 3 sets - 10 reps - Standing Bilateral Heel Raise on Step  - 1 x daily - 7 x weekly - 3 sets - 10 reps - Seated Hamstring Stretch  - 1 x daily - 7 x weekly - 1 sets - 3 reps - 1 min hold - Clamshell  - 1 x daily - 7 x weekly - 3 sets - 10 reps - Seated Hamstring Curl with Anchored Resistance  - 1 x daily - 7 x weekly - 3 sets - 10 reps - Seated Hip Abduction/External Rotation With Resistance at Thighs  - 1 x daily - 7 x weekly - 3 sets - 10 reps - Hip Abduction with Resistance Loop  - 1 x daily - 7 x weekly - 3 sets - 10 reps - Standing 3-Way Leg Reach with Resistance at Ankles and Counter Support  - 1 x daily - 7 x weekly - 3 sets - 10 reps - Squat with TRX  - 1 x daily - 7 x weekly - 3 sets - 10 reps - Lateral Lunge with TRX  - 1 x daily - 7 x weekly - 3 sets - 10 reps - Assisted Lunge with TRX  - 1 x daily - 7 x weekly - 3 sets - 10 reps  ASSESSMENT:  CLINICAL IMPRESSION:  Pt was able to complete interventions today with no increased pain. Emphasized functional strengthening patterns focused on eccentric quad strength. Verbal cues required throughout session to correct form. Pt doing well, anticipate that she will discharge from  PT this Thursday.    Eval:Patient is a 73 y.o. female who was evaluated today by physical therapy  for recovery from a recent fall with IT band strain. Her pain has improved quite a bit, did find today weakness R plantarflexors and R hip abductors, and some loss of extension Rom R knee as  compared to her final PT visit at the end of 2024 as she was recovering from unilateral knee arthroplasty.  Her endurance is compromised, unable to complete 6 min walk test.  Also she has unsteady gait with marked postural sway, has additional history of falls prior to her R knee surgery.  Would benefit from physical therapy intervention to address her deficits and assist her with improving her balance and gait efficiency.    OBJECTIVE IMPAIRMENTS: decreased activity tolerance, decreased balance, decreased endurance, decreased knowledge of condition, difficulty walking, decreased ROM, decreased strength, and decreased safety awareness.   ACTIVITY LIMITATIONS: carrying, lifting, stairs, locomotion level, and caring for others  PARTICIPATION LIMITATIONS: cleaning, laundry, shopping, community activity, and yard work  PERSONAL FACTORS: Age, Behavior pattern, Fitness, Past/current experiences, Time since onset of injury/illness/exacerbation, and 1-2 comorbidities: recent partial R knee replacement are also affecting patient's functional outcome.   REHAB POTENTIAL: Good  CLINICAL DECISION MAKING: Evolving/moderate complexity  EVALUATION COMPLEXITY: Moderate   GOALS: Goals reviewed with patient? Yes  SHORT TERM GOALS: Target date: 2 weeks 08/23/23 I HEP Baseline: Goal status: MET- 08/30/23   LONG TERM GOALS: Target date: 8 weeks 10/04/23, EXTENDED TO 11/15/23:  Strength R hip abd and plantarflexion improve to 5/5 from 4-/5 for increased stability with gait pattern Baseline:  09/27/23:  Advancing--see MMT tables above Goal status: IN PROGRESS 10/04/23: ME tof hip abd, quads, plantarflexion needs to improve   2.  Complete 6 min walk test, walking greater than 800'  Baseline: 4 min 24 sec and 600'  Goal status: MET Walked 1200' in 6 min today  3.  Improve R knee extension ROM to -5 or better from -11 for more efficient gait pattern Baseline:  9/18:   see ROM table above Goal status:  MET   4.  30 sec sit to stand improve from 13 reps to 16 Baseline:  Goal status:progressing 08/28/23; 14 reps improved  10/04/23: not met , 14 reps      PLAN:  PT FREQUENCY: 2x/week  PT DURATION: 8 weeks  PLANNED INTERVENTIONS: 97110-Therapeutic exercises, 97530- Therapeutic activity, 97112- Neuromuscular re-education, 97535- Self Care, and 02859- Manual therapy  PLAN FOR NEXT SESSION: recheck goals, discharge   Sol LITTIE Gaskins, PTA 10/09/2023, 2:07 PM [facility] at [facility] [address]  [address] [place], [place], [address] Phone: [phone]   Fax:  [phone]  Patient Details  Name: Megan Crane MRN: [id number] Date of Birth: Feb 02, 1950 Referring Provider:  Edna Toribio LABOR, MD  Encounter Date: 10/09/2023   Sol LITTIE Gaskins, PTA 10/09/2023, 2:07 PM  [place] [facility] Outpatient Rehabilitation at [facility] [address]  [address] [place], [place], [address] Phone: [phone]   Fax:  [phone]

## 2023-10-11 ENCOUNTER — Encounter: Payer: Self-pay | Admitting: Family Medicine

## 2023-10-11 ENCOUNTER — Other Ambulatory Visit: Payer: Self-pay

## 2023-10-11 ENCOUNTER — Ambulatory Visit: Admitting: Family Medicine

## 2023-10-11 ENCOUNTER — Ambulatory Visit: Attending: Orthopedic Surgery

## 2023-10-11 VITALS — BP 110/70 | HR 66 | Temp 98.1°F | Wt 213.0 lb

## 2023-10-11 DIAGNOSIS — H6991 Unspecified Eustachian tube disorder, right ear: Secondary | ICD-10-CM

## 2023-10-11 DIAGNOSIS — R262 Difficulty in walking, not elsewhere classified: Secondary | ICD-10-CM | POA: Diagnosis present

## 2023-10-11 DIAGNOSIS — M6281 Muscle weakness (generalized): Secondary | ICD-10-CM | POA: Insufficient documentation

## 2023-10-11 DIAGNOSIS — M25661 Stiffness of right knee, not elsewhere classified: Secondary | ICD-10-CM | POA: Diagnosis present

## 2023-10-11 DIAGNOSIS — R600 Localized edema: Secondary | ICD-10-CM | POA: Insufficient documentation

## 2023-10-11 DIAGNOSIS — M25561 Pain in right knee: Secondary | ICD-10-CM | POA: Diagnosis present

## 2023-10-11 DIAGNOSIS — R2689 Other abnormalities of gait and mobility: Secondary | ICD-10-CM | POA: Insufficient documentation

## 2023-10-11 MED ORDER — FLUTICASONE PROPIONATE 50 MCG/ACT NA SUSP: 2.0000 | Freq: Every day | NASAL | 6 refills | Status: AC

## 2023-10-11 NOTE — Therapy (Signed)
 OUTPATIENT PHYSICAL THERAPY LOWER EXTREMITY TREATMENT    Patient Name: Megan Crane MRN: [id number] DOB:1951/01/09, 73 y.o., female Today's Date: 10/11/2023  END OF SESSION:  PT End of Session - 10/11/23 1542     Visit Number 14    Date for Recertification  11/15/23    PT Start Time 1542    PT Stop Time 1615    PT Time Calculation (min) 33 min    Activity Tolerance Patient tolerated treatment well    Behavior During Therapy [facility] for tasks assessed/performed          Past Medical History:  Diagnosis Date   Anxiety disorder    hx vasovagal responses   Arthritis    Cancer (HCC)    Deep vein thrombosis (DVT) (HCC)    hx of in left left after covid in 2021   Headache(784.0)    History of kidney stones    Hydronephrosis, right    Hyperlipidemia    Hypertension    Nephrolithiasis    Pneumonia    Positive nasal culture for methicillin resistant Staphylococcus aureus    Right ureteral stone    Thrombophlebitis of leg, left, superficial 11/17/2020   Urinary incontinence    sees Dr. McDiarmid    Vasovagal syncope 08/02/2022   Past Surgical History:  Procedure Laterality Date   BLADDER REPAIR     COLONOSCOPY  04/26/2021   per Dr. Leigh, diffuse diverticulosis, rectosigmoid colitis with narrowed lumen, repeat in 5 yrs   CYSTOSCOPY  06/09/2011   Procedure: CYSTOSCOPY;  Surgeon: Glendia DELENA Elizabeth, MD;  Location: [facility];  Service: Urology;  Laterality: N/A;   CYSTOSCOPY W/ URETERAL STENT PLACEMENT  02/03/2014   Procedure: CYSTOSCOPY WITH  RIGHT RETROGRADE PYELOGRAM/ RIGHT URETERAL STENT PLACEMENT;  Surgeon: Norleen JINNY Seltzer, MD;  Location: [facility];  Service: Urology;;   CYSTOSCOPY WITH BIOPSY  02/03/2014   Procedure: CYSTOSCOPY WITH BIOPSY;  Surgeon: Norleen JINNY Seltzer, MD;  Location: [facility];  Service: Urology;;   PARTIAL KNEE ARTHROPLASTY Right 09/18/2022   Procedure: UNICOMPARTMENTAL KNEE;  Surgeon: Edna Toribio DELENA, MD;  Location: [facility];  Service: Orthopedics;  Laterality: Right;   PILONIDAL CYST EXCISION  1974   RECTOCELE REPAIR  06/09/2011   Procedure: POSTERIOR REPAIR (RECTOCELE);  Surgeon: Glendia DELENA Elizabeth, MD;  Location: [facility];  Service: Urology;  Laterality: N/A;  Xenform graft 6x10   RIGHT URETEROSCOPIC STONE EXTRACTION / STENT PLACEMENT  03/13/2000   URETEROSCOPY Right 02/03/2014   Procedure: URETEROSCOPY WITH MANAGEMENT OF URETERAL STRICTURE;  Surgeon: Norleen JINNY Seltzer, MD;  Location: [facility];  Service: Urology;  Laterality: Right;   VAGINAL HYSTERECTOMY  06/09/2011   Procedure: HYSTERECTOMY VAGINAL;  Surgeon: Charlie JONETTA Aho, MD;  Location: [facility];  Service: Gynecology;  Laterality: N/A;   VAGINAL PROLAPSE REPAIR  06/09/2011   Procedure: VAGINAL VAULT SUSPENSION;  Surgeon: Glendia DELENA Elizabeth, MD;  Location: [facility];  Service: Urology;  Laterality: N/A;   VEIN LIGATION AND STRIPPING     LEFT LEG   Patient Active Problem List   Diagnosis Date Noted   S/P right unicompartmental knee replacement 09/18/2022   IC (interstitial cystitis) 09/04/2022   Pelvic pressure in female 08/22/2022   Visit for pre-operative examination 08/07/2022   Vasovagal syncope 08/02/2022   Nasal dryness 06/30/2022   Precancerous skin lesion 06/30/2022   Bilateral carpal tunnel syndrome 03/02/2022   Swelling of both hands 03/02/2022   Atypical chest pain 03/02/2022  OAB (overactive bladder) 11/23/2021   Corn of foot 11/23/2021   Atrophic vaginitis 11/23/2021   COVID-19 virus infection 11/17/2020   Raynaud's phenomenon without gangrene 11/17/2020   Primary osteoarthritis involving multiple joints 11/24/2019   GERD (gastroesophageal reflux disease) 12/26/2016   Edema 05/29/2014   Nasal colonization with methicillin-resistant Staphylococcus aureus 01/22/2014   Varicose veins of bilateral lower extremities with other complications 12/11/2012   Allergic rhinitis 11/29/2009   MOTION SICKNESS 11/29/2009    Hyperlipidemia 08/31/2008   Anxiety state 08/31/2008   Primary hypertension 08/31/2008   PILONIDAL CYST 08/31/2008   HEADACHE 08/31/2008   URINARY INCONTINENCE 08/31/2008   NEPHROLITHIASIS, HX OF 08/31/2008   POSTNASAL DRIP SYNDROME 05/28/2006    PCP: Johnny Senior, MD   REFERRING PROVIDER: Edna Sieving, MD  REFERRING DIAG: R IT  band syndrome  THERAPY DIAG:  Muscle weakness (generalized)  Difficulty in walking, not elsewhere classified  Stiffness of right knee, not elsewhere classified  Other abnormalities of gait and mobility  Localized edema  Acute pain of right knee  Rationale for Evaluation and Treatment: Rehabilitation  ONSET DATE: 06/29/23  SUBJECTIVE:   SUBJECTIVE STATEMENT: Pt arrived late today, abbreviated appt.  Getting ready to go to [place]  to see her grandchildren , much easier now to walk through grocery store, not as tired, not weaving so much   Eval:Fell in hallway, dog had accident , L foot slipped out, landed on R lat knee with knee bent.  Saw orthopedist who did the partial knee replacement last year and they referred me here.  Then I had diverticulitis, then bronchitis, so I've been really weak and my immunity/ stamina has been depleted.  Noticing R ant knee and lower leg pulls, aches when I bend it a lot.  More prevalent problem currently is my stamina  PERTINENT HISTORY: Referred by orthopedist PAIN:  Are you having pain? Reports mild pain R post knee and ant lower leg, knee with specific positions, no interference with sleep or most functional activities  PRECAUTIONS: None  RED FLAGS: None   WEIGHT BEARING RESTRICTIONS: No  FALLS:  Has patient fallen in last 6 months? Yes. Number of falls 1  LIVING ENVIRONMENT: Lives with: lives with their family and lives with their spouse Lives in: House/apartment Stairs: No Has following equipment at home: Single point cane  OCCUPATION: retired  PLOF: Independent  PATIENT GOALS: return to  normal with endurance, activity tolerance  NEXT MD VISIT: unclear  OBJECTIVE:  Note: Objective measures were completed at Evaluation unless otherwise noted.  DIAGNOSTIC FINDINGS: orthopedist radiology results wnl  PATIENT SURVEYS:  PSFS: THE PATIENT SPECIFIC FUNCTIONAL SCALE  Place score of 0-10 (0 = unable to perform activity and 10 = able to perform activity at the same level as before injury or problem)  Activity Date: 08/09/23 10/11/23:   Getting out of car and standing up 5 10   2.  Walking through grocery store 5 9   3.  Prolonged sitting  8 10   4.      Total Score 18 29     Total Score = Sum of activity scores/number of activities  Minimally Detectable Change: 3 points (for single activity); 2 points (for average score)  [facility] (nd). The Patient Specific Functional Scale . Retrieved from [email or web address]   COGNITION: Overall cognitive status: Within functional limits for tasks assessed     SENSATION: WFL  EDEMA:  Mild visible distal R thigh  MUSCLE LENGTH: Hamstrings: Right wnl deg; Left  wnl deg Debby test: Right nt deg; Left nt deg  POSTURE: anterior pelvic tilt and R knee flexion 10 degrees in standing  PALPATION: Mild tenderness over lateral trochanter R hip and R IT band  LOWER EXTREMITY ROM:  AA ROM Right eval Left eval R  09/04/23 RLE 09/28/23 LLE 09/28/23  Hip flexion       Hip extension       Hip abduction       Hip adduction       Hip internal rotation       Hip external rotation       Knee flexion 116  130 132 138  Knee extension -8   -4 0  Ankle dorsiflexion       Ankle plantarflexion       Ankle inversion       Ankle eversion        (Blank rows = not tested)  LOWER EXTREMITY MMT:  MMT Right eval Left eval RLE 09/28/23   Hip flexion      Hip extension      Hip abduction 4-  4   Hip adduction      Hip internal rotation   4   Hip external rotation 5  5    Knee flexion   4   Knee extension   4+   Ankle dorsiflexion      Ankle plantarflexion 4-     Ankle inversion      Ankle eversion       (Blank rows = not tested)  LOWER EXTREMITY SPECIAL TESTS:  Hip special tests: Belvie (FABER) test: negative  FUNCTIONAL TESTS:  6 minute walk test: 4 min 24 sec 30 sec sit to stand 13 reps GAIT: Distance walked: 600' Assistive device utilized: None Level of assistance: Modified independence Comments: gait pattern with + trendelenburg R, increased hip sway B, tends to scissor at time with gait.                                                                                                                                TREATMENT DATE: 10/11/23:   Abbreviated session today as arrived 15 min late Recumbent bicycle, 6 min, level 3 Seated R hamstring stretch with yoga  Supine for R lower leg distraction with quad sets 5 sec holds, x 10 reps  B knee ext 10# 2 x 10 Hamstring curls 25# 2 x 10   10/09/23 Nustep L5x51min Lunges at counter x 15 B Lateral lunges x 15 B Squats x 15 with TRX Standing hip ext with TRX x 15 B Reviewed lat lunge and fwd lunge with TRX Knee ext R/L 5lb x 15 each Bridge orange pball x 20  10/04/23:  Nustep L  5 x 5 min Supine for R hamstring stretch therapist providing overpressure R post knee to isolate post capsule Supine R lower leg distraction c 5 with 10 sec quad sets, to isolate stretch  R post capsule  Hamstring curls B 20# 15 x  B knee ext 10# 2 x 15 6 min walk test , 5 laps   10/02/23 Nustep L5x16min Standing heel drop heel raises from 4' step x 15 Step ups 4' with hip extension x 15 R Lateral step up RLE 4' with hip abduction x 15 Leg press 18lb x 20 BLE     09/28/23 NuStep L5 x 8' BUE/BLE  Strength and ROM rechecked BOSU lunges x 2/10 RLE Seated hip ER blue TB x 2/10 BLE Seated knee flexion blue TB x 2/10 RLE Standing hip abd RTB x 2/10 RLE Standing hip ext RTB x 2/10 RLE Seated ham string  stretch x 1'x 2 RLE SL hip abd x 2/10 RLE Supine cross over piriformis stretch x 1' x 2 RLE Seated knee extension 5# x 2/10 RLE   09/14/23:  Nustep L 5 x 8 min seat 11 Ue's and LE's Squats with counter support 2x10 BLE lunges touching 9' stool x 10; progressed to touching BOSU ball x 10 Lateral lunges BLE 2 x 10 S/L R hip abduction 2# 2x10 S/L R clamshell 4# 2x10 SLR 2# RLE 2x10  09/11/23:  Nustep L 5 x 6 min seat 11 Ue's and LE's R hamstring stretch with therapist providing overpressure R post knee jt in supine R ankle distraction with pt performing quad sets for post capsule stretch Side lying L for clam shell R 4# 10 x 2  Side lying R hip abd no resistance 10 x 2  B knee ext 10lb 3x10 B LE B knee flexion 3x10 25# B LE  09/07/23 walk test- went whole time 1200 ft no break, some labored Sit to stands x 10 Wall squats 2x10 mini squats B knee ext 10lb 3x10 BLE B knee flexion 3x10 25# BLE Heel drop heel raise 2x10 B Lateral step downs 2' 2x10 BLE  09/04/23:  Nustep level 5 x 7 min Gait in hallway, 300'  pt with insert L shoe, walked well, no trendelenberg noted, no scissoring R hamstring stretch with therapist providing overpressure R post knee jt in supine R ankle distraction with pt performing quad sets for post capsule stretch B knee ext 15lb x 2 sets 15 BLE B knee flexion 2 x 15 25#  08/30/23: Nustep level 5 x  6 min UE's and LE's Side lying L for theragun massage R hip abductors, ITB Seated R HS stretch RLE x 30 B knee ext 15lb x 15 BLE; RLE 5lb x 15 Sidelying R hip abduction x 10  Standing hip abduction 3lb 2x10 BLE Standing HS curls 3lb 2x10 BLE Standing hip ext 3lb 2x10 BLE  08/28/23: Nustep level 5 x  6 min UE's and LE's Side lying L for theragun massage R hip abductors Supine for distraction  R ankle with quad sets, 5 sec holds, x 10 reps  B knee ext 10#, con lift, eccentric lowering with R, x 10 reps x 3 B knee flex 25# CON lowering and eccentric  raising with R LE Side lying for R hip clam shell with 3# cuff wt over R knee jt R hamstring stretch with therapist providing overpressure R post knee jt in supine Standing pillow case slides with L LE , R LE wbing to engage stability R lat hip musculature.   08/16/23: Manual: side lying L for cross friction massage primarily R TFL musculature, using theragun Supine for manual stretching by therapist for R hamstrings, 3 bouts Supine for AP  glides R tibia, with femur supported on bolster, with tibia adduction direction, 3 bouts, to assist with improving R knee flexion motion tolerance Supine for patellar mobs R  Provided with rubber insert for L shoe as the cork is breaking down but working well.  Therapeutic exercise:  Bridging B, 15 x with sustained  abduction hips, with black t band around knees , cues to maintain tension for abduction Hooklying hip abd with black t band around knees 15x, needed cuing for placement Nustep level 5 x 6 min to address  activity tolerance/ endurance  08/14/23 Therapuaetic activities Added blue t band around SI to replicate SI belt and support glut medius B, gait with the belt with some improvement in gait pattern , less sway R pelvis, but pt reported fatgue R hip abductors so removed Insert 1/8 L shoe for symmetrical iliac crest height B, gait with insert with definite improvement with R lateral pelvic shift in wbing.  Advised to trial for 24 to 48 hrs and self assess.  Manual: Side lying L for cross friction massage primarily R TFL musculature, using theragun Supine for patellar mobs R , also cross fricoitn massage med and lat knee jt lines , pt with some tenderness there  Therex:  Instructed in bridging B, 15 x with sustained  abduction hips, with black t band around knees  Hooklying hip abd with black t band around knees  15 x Provided with printed hand outs from medbridge to follow on these 2 ex.  Nustep Level 4  Ue's and LE's, x 3 min 30 sec at end of  session for endurance.   08/09/23  Evaluation, instructed in seated  or long sitting R ankle pumps with black theraband resistance 15 reps, 1 x day, education throughout in evaluation findings and gait, improving gait efficiency with reducing postural. Pelvic sway   PATIENT EDUCATION:  Education details: added new HEP Person educated: Patient Education method: Explanation, Demonstration, Tactile cues, Verbal cues, and Handouts Education comprehension: verbalized understanding  HOME EXERCISE PROGRAM: Access Code: [email or web address] URL: [email or web address] Date: 10/09/2023 Prepared by: Braylin Clark  Exercises - Wall Quarter Squat  - 1 x daily - 7 x weekly - 3 sets - 10 reps - Standing Bilateral Heel Raise on Step  - 1 x daily - 7 x weekly - 3 sets - 10 reps - Seated Hamstring Stretch  - 1 x daily - 7 x weekly - 1 sets - 3 reps - 1 min hold - Clamshell  - 1 x daily - 7 x weekly - 3 sets - 10 reps - Seated Hamstring Curl with Anchored Resistance  - 1 x daily - 7 x weekly - 3 sets - 10 reps - Seated Hip Abduction/External Rotation With Resistance at Thighs  - 1 x daily - 7 x weekly - 3 sets - 10 reps - Hip Abduction with Resistance Loop  - 1 x daily - 7 x weekly - 3 sets - 10 reps - Standing 3-Way Leg Reach with Resistance at Ankles and Counter Support  - 1 x daily - 7 x weekly - 3 sets - 10 reps - Squat with TRX  - 1 x daily - 7 x weekly - 3 sets - 10 reps - Lateral Lunge with TRX  - 1 x daily - 7 x weekly - 3 sets - 10 reps - Assisted Lunge with TRX  - 1 x daily - 7 x weekly - 3 sets - 10 reps  ASSESSMENT:  CLINICAL IMPRESSION:  Pt was able to complete interventions today with no increased pain. Today is her final  PT session.  We addressed her home routine and safety with mopping, sweeping, and frequency for strengthening at the gym. Also reviewed her R hamstring stretch.  She is walking better less weaving, attributes to the insert L shoe, plans to continue to  use.   Eval:Patient is a 73 y.o. female who was evaluated today by physical therapy  for recovery from a recent fall with IT band strain. Her pain has improved quite a bit, did find today weakness R plantarflexors and R hip abductors, and some loss of extension Rom R knee as compared to her final PT visit at the end of 2024 as she was recovering from unilateral knee arthroplasty.  Her endurance is compromised, unable to complete 6 min walk test.  Also she has unsteady gait with marked postural sway, has additional history of falls prior to her R knee surgery.  Would benefit from physical therapy intervention to address her deficits and assist her with improving her balance and gait efficiency.    OBJECTIVE IMPAIRMENTS: decreased activity tolerance, decreased balance, decreased endurance, decreased knowledge of condition, difficulty walking, decreased ROM, decreased strength, and decreased safety awareness.   ACTIVITY LIMITATIONS: carrying, lifting, stairs, locomotion level, and caring for others  PARTICIPATION LIMITATIONS: cleaning, laundry, shopping, community activity, and yard work  PERSONAL FACTORS: Age, Behavior pattern, Fitness, Past/current experiences, Time since onset of injury/illness/exacerbation, and 1-2 comorbidities: recent partial R knee replacement are also affecting patient's functional outcome.   REHAB POTENTIAL: Good  CLINICAL DECISION MAKING: Evolving/moderate complexity  EVALUATION COMPLEXITY: Moderate   GOALS: Goals reviewed with patient? Yes  SHORT TERM GOALS: Target date: 2 weeks 08/23/23 I HEP Baseline: Goal status: MET- 08/30/23   LONG TERM GOALS: Target date: 8 weeks 10/04/23, EXTENDED TO 11/15/23:  Strength R hip abd and plantarflexion improve to 5/5 from 4-/5 for increased stability with gait pattern Baseline:  09/27/23:  Advancing--see MMT tables above Goal status: IN PROGRESS 10/04/23: ME for hip abd, quads, plantarflexion needs to improve   2.  Complete  6 min walk test, walking greater than 800'  Baseline: 4 min 24 sec and 600'  Goal status: MET Walked 1200' in 6 min today  3.  Improve R knee extension ROM to -5 or better from -11 for more efficient gait pattern Baseline:  9/18:   see ROM table above Goal status: MET   4.  30 sec sit to stand improve from 13 reps to 16 Baseline:  Goal status:progressing 08/28/23; 14 reps improved  10/04/23: not met , 14 reps 10/11/23: 14 x unchanged     PLAN:  PT FREQUENCY: 2x/week  PT DURATION: 8 weeks  PLANNED INTERVENTIONS: 97110-Therapeutic exercises, 97530- Therapeutic activity, 97112- Neuromuscular re-education, 97535- Self Care, and 02859- Manual therapy  PLAN FOR NEXT SESSION: discharge today   Greig LITTIE Credit, PT 10/11/2023, 4:25 PM [facility] Outpatient Rehabilitation at [facility] [address]  [address] [place], [place], [address] Phone: [phone]   Fax:  [phone]  Patient Details  Name: Megan Crane MRN: [id number] Date of Birth: 23-Jun-1950 Referring Provider:  Edna Toribio LABOR, MD  Encounter Date: 10/11/2023   Greig LITTIE Credit, PT 10/11/2023, 4:25 PM  [facility] Out[facility] Rehabilitation at [facility] [address]  [address] [place], [place], [address] Phone: [phone]   Fax:  [phone]

## 2023-10-11 NOTE — Progress Notes (Signed)
   Subjective:    Patient ID: Megan Crane, female    DOB: [birth year], 73 y.o.   MRN: [id number]  HPI Here complaining of a mild pain in the right ear that comes and goes. She has some sinus congestion but no fever or ST or cough.    Review of Systems  Constitutional: Negative.   HENT:  Positive for congestion and ear pain. Negative for postnasal drip, sinus pressure and sore throat.   Eyes: Negative.   Respiratory: Negative.         Objective:   Physical Exam Constitutional:      Appearance: Normal appearance.  HENT:     Right Ear: Tympanic membrane, ear canal and external ear normal.     Left Ear: Tympanic membrane, ear canal and external ear normal.     Nose: Nose normal.     Mouth/Throat:     Pharynx: Oropharynx is clear.  Eyes:     Conjunctiva/sclera: Conjunctivae normal.  Pulmonary:     Effort: Pulmonary effort is normal.     Breath sounds: Normal breath sounds.  Lymphadenopathy:     Cervical: No cervical adenopathy.  Neurological:     Mental Status: She is alert.           Assessment & Plan:  Eustachian tube dysfunction. She will start back on Flonase  nasal sprays every day.  Garnette Olmsted, MD

## 2023-10-25 DIAGNOSIS — Z9889 Other specified postprocedural states: Secondary | ICD-10-CM | POA: Diagnosis not present

## 2023-10-25 DIAGNOSIS — Z792 Long term (current) use of antibiotics: Secondary | ICD-10-CM | POA: Diagnosis not present

## 2023-10-25 DIAGNOSIS — Z96651 Presence of right artificial knee joint: Secondary | ICD-10-CM | POA: Diagnosis not present

## 2023-11-05 DIAGNOSIS — L570 Actinic keratosis: Secondary | ICD-10-CM | POA: Diagnosis not present

## 2023-11-05 DIAGNOSIS — L649 Androgenic alopecia, unspecified: Secondary | ICD-10-CM | POA: Diagnosis not present

## 2023-11-05 DIAGNOSIS — Z85828 Personal history of other malignant neoplasm of skin: Secondary | ICD-10-CM | POA: Diagnosis not present

## 2023-11-05 DIAGNOSIS — L821 Other seborrheic keratosis: Secondary | ICD-10-CM | POA: Diagnosis not present

## 2023-11-05 DIAGNOSIS — L820 Inflamed seborrheic keratosis: Secondary | ICD-10-CM | POA: Diagnosis not present

## 2024-01-01 ENCOUNTER — Encounter: Payer: Self-pay | Admitting: Family Medicine

## 2024-01-01 ENCOUNTER — Ambulatory Visit: Admitting: Family Medicine

## 2024-01-01 VITALS — BP 120/82 | HR 64 | Temp 98.4°F | Wt 212.0 lb

## 2024-01-01 DIAGNOSIS — I10 Essential (primary) hypertension: Secondary | ICD-10-CM | POA: Diagnosis not present

## 2024-01-01 DIAGNOSIS — S8391XA Sprain of unspecified site of right knee, initial encounter: Secondary | ICD-10-CM

## 2024-01-01 MED ORDER — HYDROCHLOROTHIAZIDE 25 MG PO TABS: 25.0000 mg | ORAL_TABLET | Freq: Every day | ORAL | 3 refills | Status: AC

## 2024-01-01 MED ORDER — MELOXICAM 15 MG PO TABS: 15.0000 mg | ORAL_TABLET | Freq: Every day | ORAL | Status: AC

## 2024-01-01 NOTE — Progress Notes (Signed)
" ° °  Subjective:    Patient ID: Megan Crane, female    DOB: [birth year], 73 y.o.   MRN: [id number]  HPI Here to check her right knee after a fall at home last night. As she was walking acorss the floor her foot slipped out, and she fel backwards. No head trauma, but she twisted her right knee. It was stiff and mildly painful last night, but it never swelled. Today it feels much better.    Review of Systems  Constitutional: Negative.   Respiratory: Negative.    Cardiovascular: Negative.   Musculoskeletal:  Positive for arthralgias.       Objective:   Physical Exam Constitutional:      General: She is not in acute distress.    Comments: Walking normally   Cardiovascular:     Rate and Rhythm: Normal rate and regular rhythm.     Pulses: Normal pulses.     Heart sounds: Normal heart sounds.  Pulmonary:     Effort: Pulmonary effort is normal.     Breath sounds: Normal breath sounds.  Musculoskeletal:     Comments: The right knee shows no swelling. She is mildly tender in both joint spaces. ROM is full.   Neurological:     Mental Status: She is alert.           Assessment & Plan:  Right knee sprain. This seems to be mild and it should resolve in the next week or two. Recheck as needed.  Garnette Olmsted, MD   "

## 2024-01-20 ENCOUNTER — Other Ambulatory Visit: Payer: Self-pay | Admitting: Family Medicine

## 2024-01-20 DIAGNOSIS — I10 Essential (primary) hypertension: Secondary | ICD-10-CM

## 2024-01-22 ENCOUNTER — Telehealth: Payer: Self-pay | Admitting: Family Medicine

## 2024-01-22 DIAGNOSIS — N3010 Interstitial cystitis (chronic) without hematuria: Secondary | ICD-10-CM

## 2024-01-22 DIAGNOSIS — R102 Pelvic and perineal pain unspecified side: Secondary | ICD-10-CM

## 2024-01-22 DIAGNOSIS — M150 Primary generalized (osteo)arthritis: Secondary | ICD-10-CM

## 2024-01-22 DIAGNOSIS — N952 Postmenopausal atrophic vaginitis: Secondary | ICD-10-CM

## 2024-01-22 DIAGNOSIS — L989 Disorder of the skin and subcutaneous tissue, unspecified: Secondary | ICD-10-CM

## 2024-01-22 DIAGNOSIS — R32 Unspecified urinary incontinence: Secondary | ICD-10-CM

## 2024-01-22 DIAGNOSIS — Z96651 Presence of right artificial knee joint: Secondary | ICD-10-CM

## 2024-01-22 NOTE — Telephone Encounter (Signed)
 Copied from CRM [id number]. Topic: Referral - Request for Referral >> Jan 22, 2024  2:22 PM Lonell PEDLAR wrote: Did the patient discuss referral with their provider in the last year? No (If No - schedule appointment) (If Yes - send message)  Appointment offered? No  Type of order/referral and detailed reason for visit: Patient was told with recent changes to insurance, she requires referrals for her insurance.   Preference of office, provider, location:   Dr. Toribio Higashi - Orthopedic  Dr. Lamar Spence - OBGYN Dr. Dorothyann Jenkins Ku - Urology Reda Piety, [place] - Dermatology [facility]) Dr. Jolene Jewell - Dermatology [facility])   Patient is seeing two dermatologists at the moment, until she is able to decide which provider she likes best. She is seeking a second opinion from [facility]   If referral order, have you been seen by this specialty before? Yes (If Yes, this issue or another issue? When? Where?  Can we respond through MyChart? No

## 2024-01-23 NOTE — Telephone Encounter (Signed)
 Spoke with pt voiced understanding that referrals have been placed. Pt states that her hair is thinning and she was recommended a hair serum called Belvia Deck by her dermatology to stop with hair thinning, pt request for Dr to advise if this will interact with any prescription she is taking. Please advise

## 2024-01-23 NOTE — Telephone Encounter (Signed)
 I did the new referrals

## 2024-01-24 ENCOUNTER — Telehealth: Payer: Self-pay | Admitting: Family Medicine

## 2024-01-24 NOTE — Telephone Encounter (Signed)
 There would be no interactions. She can try this if she wants

## 2024-01-24 NOTE — Telephone Encounter (Signed)
 Copied from CRM [id number]. Topic: Referral - Request for Referral >> Jan 22, 2024  2:22 PM Lonell PEDLAR wrote: Did the patient discuss referral with their provider in the last year? No (If No - schedule appointment) (If Yes - send message)  Appointment offered? No  Type of order/referral and detailed reason for visit: Patient was told with recent changes to insurance, she requires referrals for her insurance.   Preference of office, provider, location:   Dr. Toribio Higashi - Orthopedic  Dr. Lamar Spence - OBGYN Dr. Dorothyann Jenkins Ku - Urology Reda Piety, [place] - Dermatology [facility]) Dr. Jolene Jewell - Dermatology [facility])   Patient is seeing two dermatologists at the moment, until she is able to decide which provider she likes best. She is seeking a second opinion from [facility]   If referral order, have you been seen by this specialty before? Yes (If Yes, this issue or another issue? When? Where?  Can we respond through MyChart? No

## 2024-01-24 NOTE — Telephone Encounter (Signed)
 Pt advised of Dr Johnny recommendation, voiced understanding

## 2024-01-24 NOTE — Telephone Encounter (Signed)
 Copied from CRM [id number]. Topic: Referral - Request for Referral >> Jan 24, 2024  1:33 PM Deleta RAMAN wrote: Patient needs authorization for insurance as well. [employer] requests authorization that needs to be keyed in. Please contact [facility] at [phone] [facility]

## 2024-01-24 NOTE — Telephone Encounter (Unsigned)
 Copied from CRM [id number]. Topic: Referral - Request for Referral >> Jan 24, 2024  1:33 PM Deleta RAMAN wrote: Patient needs authorization for insurance as well. [employer] requests authorization that needs to be keyed in. Please contact [facility] at [phone] [facility]

## 2024-02-12 ENCOUNTER — Ambulatory Visit: Payer: Self-pay

## 2024-02-12 NOTE — Telephone Encounter (Signed)
 Noted.

## 2024-02-13 ENCOUNTER — Encounter: Payer: Self-pay | Admitting: Family Medicine

## 2024-02-13 ENCOUNTER — Ambulatory Visit: Admitting: Family Medicine

## 2024-02-13 VITALS — BP 130/80 | HR 78 | Temp 98.7°F | Wt 213.0 lb

## 2024-02-13 DIAGNOSIS — J029 Acute pharyngitis, unspecified: Secondary | ICD-10-CM

## 2024-02-13 DIAGNOSIS — J40 Bronchitis, not specified as acute or chronic: Secondary | ICD-10-CM

## 2024-02-13 DIAGNOSIS — R350 Frequency of micturition: Secondary | ICD-10-CM

## 2024-02-13 DIAGNOSIS — R059 Cough, unspecified: Secondary | ICD-10-CM

## 2024-02-13 LAB — POC URINALSYSI DIPSTICK (AUTOMATED)
Bilirubin, UA: NEGATIVE
Blood, UA: NEGATIVE
Glucose, UA: NEGATIVE
Ketones, UA: NEGATIVE
Leukocytes, UA: NEGATIVE
Nitrite, UA: NEGATIVE
Protein, UA: NEGATIVE
Spec Grav, UA: 1.01
Urobilinogen, UA: 0.2 U/dL
pH, UA: 6

## 2024-02-13 LAB — POCT INFLUENZA A/B
Influenza A, POC: NEGATIVE
Influenza B, POC: NEGATIVE

## 2024-02-13 LAB — POCT RAPID STREP A (OFFICE): Rapid Strep A Screen: NEGATIVE

## 2024-02-13 LAB — POC COVID19 BINAXNOW: SARS Coronavirus 2 Ag: NEGATIVE — NL

## 2024-02-13 MED ORDER — AMOXICILLIN-POT CLAVULANATE 875-125 MG PO TABS: 1.0000 | ORAL_TABLET | Freq: Two times a day (BID) | ORAL | 0 refills | Status: AC

## 2024-02-13 NOTE — Progress Notes (Signed)
" ° °  Subjective:    Patient ID: Megan Crane, female    DOB: 09/09/1950, 74 y.o.   MRN: [id number]  HPI Here for 4 days of ST, chest congestion and dry cough. No fever or SOB.    Review of Systems  Constitutional: Negative.   HENT:  Positive for congestion, postnasal drip and sore throat. Negative for ear pain and sinus pain.   Eyes: Negative.   Respiratory:  Positive for cough. Negative for shortness of breath and wheezing.        Objective:   Physical Exam Constitutional:      Appearance: Normal appearance.  HENT:     Right Ear: Tympanic membrane, ear canal and external ear normal.     Left Ear: Tympanic membrane, ear canal and external ear normal.     Nose: Nose normal.     Mouth/Throat:     Pharynx: Oropharynx is clear.  Eyes:     Conjunctiva/sclera: Conjunctivae normal.  Pulmonary:     Effort: Pulmonary effort is normal.     Breath sounds: Rhonchi present. No wheezing or rales.  Lymphadenopathy:     Cervical: No cervical adenopathy.  Neurological:     Mental Status: She is alert.           Assessment & Plan:  Bronchitis, treat with 10 days of Augmentin . Garnette Olmsted, MD   "

## 2024-02-20 ENCOUNTER — Ambulatory Visit
